# Patient Record
Sex: Male | Born: 1950 | Race: White | Hispanic: No | Marital: Married | Smoking: Never smoker
Health system: Southern US, Community
[De-identification: ages and names within clinical notes are randomized; demographics above are authoritative.]

## PROBLEM LIST (undated history)

## (undated) DIAGNOSIS — D689 Coagulation defect, unspecified: Secondary | ICD-10-CM

## (undated) DIAGNOSIS — E785 Hyperlipidemia, unspecified: Secondary | ICD-10-CM

## (undated) DIAGNOSIS — I4892 Unspecified atrial flutter: Secondary | ICD-10-CM

## (undated) DIAGNOSIS — Z952 Presence of prosthetic heart valve: Secondary | ICD-10-CM

## (undated) DIAGNOSIS — IMO0001 Reserved for inherently not codable concepts without codable children: Secondary | ICD-10-CM

## (undated) DIAGNOSIS — I509 Heart failure, unspecified: Secondary | ICD-10-CM

## (undated) DIAGNOSIS — I447 Left bundle-branch block, unspecified: Secondary | ICD-10-CM

## (undated) DIAGNOSIS — G4730 Sleep apnea, unspecified: Secondary | ICD-10-CM

## (undated) DIAGNOSIS — J90 Pleural effusion, not elsewhere classified: Secondary | ICD-10-CM

## (undated) DIAGNOSIS — I10 Essential (primary) hypertension: Secondary | ICD-10-CM

## (undated) DIAGNOSIS — I4891 Unspecified atrial fibrillation: Secondary | ICD-10-CM

## (undated) DIAGNOSIS — R55 Syncope and collapse: Secondary | ICD-10-CM

## (undated) DIAGNOSIS — D649 Anemia, unspecified: Secondary | ICD-10-CM

## (undated) DIAGNOSIS — I351 Nonrheumatic aortic (valve) insufficiency: Secondary | ICD-10-CM

## (undated) DIAGNOSIS — J189 Pneumonia, unspecified organism: Secondary | ICD-10-CM

## (undated) DIAGNOSIS — Z5189 Encounter for other specified aftercare: Secondary | ICD-10-CM

## (undated) DIAGNOSIS — I313 Pericardial effusion (noninflammatory): Secondary | ICD-10-CM

## (undated) DIAGNOSIS — I951 Orthostatic hypotension: Secondary | ICD-10-CM

## (undated) DIAGNOSIS — K219 Gastro-esophageal reflux disease without esophagitis: Secondary | ICD-10-CM

## (undated) DIAGNOSIS — Z7901 Long term (current) use of anticoagulants: Secondary | ICD-10-CM

## (undated) DIAGNOSIS — K8591 Acute pancreatitis with uninfected necrosis, unspecified: Secondary | ICD-10-CM

## (undated) DIAGNOSIS — R05 Cough: Secondary | ICD-10-CM

## (undated) DIAGNOSIS — Z95810 Presence of automatic (implantable) cardiac defibrillator: Secondary | ICD-10-CM

## (undated) DIAGNOSIS — E119 Type 2 diabetes mellitus without complications: Secondary | ICD-10-CM

## (undated) DIAGNOSIS — I428 Other cardiomyopathies: Secondary | ICD-10-CM

## (undated) HISTORY — DX: Acute pancreatitis with uninfected necrosis, unspecified: K85.91

## (undated) HISTORY — DX: Pericardial effusion (noninflammatory): I31.3

## (undated) HISTORY — DX: Syncope and collapse: R55

## (undated) HISTORY — DX: Heart failure, unspecified: I50.9

## (undated) HISTORY — DX: Left bundle-branch block, unspecified: I44.7

## (undated) HISTORY — DX: Long term (current) use of anticoagulants: Z79.01

## (undated) HISTORY — DX: Coagulation defect, unspecified: D68.9

## (undated) HISTORY — DX: Hyperlipidemia, unspecified: E78.5

## (undated) HISTORY — DX: Presence of prosthetic heart valve: Z95.2

## (undated) HISTORY — DX: Essential (primary) hypertension: I10

## (undated) HISTORY — DX: Pleural effusion, not elsewhere classified: J90

## (undated) HISTORY — DX: Type 2 diabetes mellitus without complications: E11.9

## (undated) HISTORY — DX: Pneumonia, unspecified organism: J18.9

## (undated) HISTORY — DX: Unspecified atrial fibrillation: I48.91

## (undated) HISTORY — DX: Other cardiomyopathies: I42.8

## (undated) HISTORY — DX: Gastro-esophageal reflux disease without esophagitis: K21.9

## (undated) HISTORY — DX: Presence of automatic (implantable) cardiac defibrillator: Z95.810

## (undated) HISTORY — DX: Nonrheumatic aortic (valve) insufficiency: I35.1

## (undated) HISTORY — DX: Cough: R05

## (undated) HISTORY — PX: ESOPHAGOGASTRODUODENOSCOPY: SHX1529

## (undated) HISTORY — DX: Orthostatic hypotension: I95.1

## (undated) HISTORY — DX: Unspecified atrial flutter: I48.92

## (undated) SURGERY — RIGHT/LEFT HEART CATH AND CORONARY ANGIOGRAPHY
Anesthesia: Moderate Sedation

---

## 1997-01-06 HISTORY — PX: ESOPHAGOGASTRODUODENOSCOPY: SHX1529

## 1997-01-18 HISTORY — PX: DOPPLER ECHOCARDIOGRAPHY: SHX263

## 1997-10-30 ENCOUNTER — Ambulatory Visit (HOSPITAL_COMMUNITY): Admission: EM | Admit: 1997-10-30 | Discharge: 1997-10-30 | Payer: Self-pay | Admitting: Emergency Medicine

## 1997-11-03 ENCOUNTER — Ambulatory Visit (HOSPITAL_COMMUNITY): Admission: RE | Admit: 1997-11-03 | Discharge: 1997-11-03 | Payer: Self-pay | Admitting: Gastroenterology

## 1998-08-09 HISTORY — PX: ESOPHAGOGASTRODUODENOSCOPY: SHX1529

## 2001-01-09 ENCOUNTER — Inpatient Hospital Stay (HOSPITAL_COMMUNITY): Admission: EM | Admit: 2001-01-09 | Discharge: 2001-01-12 | Payer: Self-pay | Admitting: Emergency Medicine

## 2001-01-13 HISTORY — PX: ESOPHAGOGASTRODUODENOSCOPY: SHX1529

## 2001-02-24 ENCOUNTER — Encounter: Payer: Self-pay | Admitting: Gastroenterology

## 2001-02-24 ENCOUNTER — Ambulatory Visit (HOSPITAL_COMMUNITY): Admission: RE | Admit: 2001-02-24 | Discharge: 2001-02-24 | Payer: Self-pay | Admitting: Gastroenterology

## 2001-03-23 ENCOUNTER — Encounter: Payer: Self-pay | Admitting: Gastroenterology

## 2001-03-23 ENCOUNTER — Ambulatory Visit (HOSPITAL_COMMUNITY): Admission: RE | Admit: 2001-03-23 | Discharge: 2001-03-23 | Payer: Self-pay | Admitting: Gastroenterology

## 2001-03-23 DIAGNOSIS — K219 Gastro-esophageal reflux disease without esophagitis: Secondary | ICD-10-CM

## 2001-03-23 HISTORY — DX: Gastro-esophageal reflux disease without esophagitis: K21.9

## 2001-03-23 HISTORY — PX: ESOPHAGOGASTRODUODENOSCOPY: SHX1529

## 2001-12-25 HISTORY — PX: ESOPHAGOGASTRODUODENOSCOPY: SHX1529

## 2003-03-01 HISTORY — PX: MRI: SHX5353

## 2003-03-10 HISTORY — PX: OTHER SURGICAL HISTORY: SHX169

## 2003-04-07 ENCOUNTER — Ambulatory Visit (HOSPITAL_COMMUNITY): Admission: RE | Admit: 2003-04-07 | Discharge: 2003-04-07 | Payer: Self-pay | Admitting: Cardiovascular Disease

## 2003-09-27 HISTORY — PX: DOPPLER ECHOCARDIOGRAPHY: SHX263

## 2004-05-29 ENCOUNTER — Ambulatory Visit: Payer: Self-pay | Admitting: Cardiology

## 2004-06-12 ENCOUNTER — Ambulatory Visit: Payer: Self-pay | Admitting: Cardiology

## 2004-06-19 ENCOUNTER — Ambulatory Visit: Payer: Self-pay

## 2004-07-09 ENCOUNTER — Ambulatory Visit: Payer: Self-pay

## 2004-07-09 HISTORY — PX: DOPPLER ECHOCARDIOGRAPHY: SHX263

## 2005-01-22 ENCOUNTER — Ambulatory Visit: Payer: Self-pay | Admitting: Family Medicine

## 2005-02-14 ENCOUNTER — Ambulatory Visit: Payer: Self-pay | Admitting: Cardiology

## 2005-02-26 ENCOUNTER — Encounter: Payer: Self-pay | Admitting: Cardiology

## 2005-02-26 ENCOUNTER — Ambulatory Visit: Payer: Self-pay

## 2005-03-08 ENCOUNTER — Emergency Department: Payer: Self-pay | Admitting: Internal Medicine

## 2005-03-08 HISTORY — PX: ESOPHAGOGASTRODUODENOSCOPY: SHX1529

## 2005-03-13 ENCOUNTER — Ambulatory Visit: Payer: Self-pay | Admitting: Unknown Physician Specialty

## 2005-06-28 ENCOUNTER — Encounter: Payer: Self-pay | Admitting: Family Medicine

## 2005-06-28 LAB — CONVERTED CEMR LAB: PSA: 0.46 ng/mL

## 2005-07-19 ENCOUNTER — Ambulatory Visit: Payer: Self-pay | Admitting: Family Medicine

## 2005-07-25 ENCOUNTER — Ambulatory Visit: Payer: Self-pay | Admitting: Family Medicine

## 2005-08-06 ENCOUNTER — Ambulatory Visit: Payer: Self-pay | Admitting: Family Medicine

## 2005-10-28 ENCOUNTER — Ambulatory Visit: Payer: Self-pay | Admitting: Cardiology

## 2006-04-01 HISTORY — PX: DOPPLER ECHOCARDIOGRAPHY: SHX263

## 2006-04-03 ENCOUNTER — Ambulatory Visit: Payer: Self-pay

## 2006-04-03 ENCOUNTER — Encounter: Payer: Self-pay | Admitting: Cardiology

## 2006-04-08 ENCOUNTER — Ambulatory Visit: Payer: Self-pay | Admitting: Cardiology

## 2006-04-22 ENCOUNTER — Ambulatory Visit: Payer: Self-pay | Admitting: Cardiology

## 2006-04-22 LAB — CONVERTED CEMR LAB
BUN: 8 mg/dL (ref 6–23)
CO2: 28 meq/L (ref 19–32)
Calcium: 9 mg/dL (ref 8.4–10.5)
Chloride: 103 meq/L (ref 96–112)
GFR calc Af Amer: 100 mL/min
Glucose, Bld: 100 mg/dL — ABNORMAL HIGH (ref 70–99)
Potassium: 3.9 meq/L (ref 3.5–5.1)

## 2006-07-17 ENCOUNTER — Encounter: Payer: Self-pay | Admitting: Family Medicine

## 2006-07-17 DIAGNOSIS — R945 Abnormal results of liver function studies: Secondary | ICD-10-CM | POA: Insufficient documentation

## 2006-07-17 DIAGNOSIS — E669 Obesity, unspecified: Secondary | ICD-10-CM | POA: Insufficient documentation

## 2006-07-17 DIAGNOSIS — I359 Nonrheumatic aortic valve disorder, unspecified: Secondary | ICD-10-CM | POA: Insufficient documentation

## 2006-07-17 DIAGNOSIS — I10 Essential (primary) hypertension: Secondary | ICD-10-CM

## 2006-07-17 DIAGNOSIS — R7989 Other specified abnormal findings of blood chemistry: Secondary | ICD-10-CM | POA: Insufficient documentation

## 2006-07-17 DIAGNOSIS — K219 Gastro-esophageal reflux disease without esophagitis: Secondary | ICD-10-CM

## 2006-07-22 ENCOUNTER — Encounter (INDEPENDENT_AMBULATORY_CARE_PROVIDER_SITE_OTHER): Payer: Self-pay | Admitting: *Deleted

## 2006-07-28 ENCOUNTER — Ambulatory Visit: Payer: Self-pay | Admitting: Family Medicine

## 2006-07-31 ENCOUNTER — Ambulatory Visit: Payer: Self-pay | Admitting: Family Medicine

## 2006-07-31 DIAGNOSIS — E749 Disorder of carbohydrate metabolism, unspecified: Secondary | ICD-10-CM | POA: Insufficient documentation

## 2006-07-31 DIAGNOSIS — E7800 Pure hypercholesterolemia, unspecified: Secondary | ICD-10-CM | POA: Insufficient documentation

## 2006-07-31 DIAGNOSIS — R740 Nonspecific elevation of levels of transaminase and lactic acid dehydrogenase [LDH]: Secondary | ICD-10-CM

## 2006-07-31 LAB — CONVERTED CEMR LAB
ALT: 52 units/L (ref 0–53)
AST: 46 units/L — ABNORMAL HIGH (ref 0–37)
Albumin: 4 g/dL (ref 3.5–5.2)
Alkaline Phosphatase: 80 units/L (ref 39–117)
BUN: 7 mg/dL (ref 6–23)
CO2: 29 meq/L (ref 19–32)
Cholesterol: 178 mg/dL (ref 0–200)
Creatinine,U: 71.2 mg/dL
GFR calc Af Amer: 90 mL/min
GFR calc non Af Amer: 74 mL/min
Glucose, Bld: 117 mg/dL — ABNORMAL HIGH (ref 70–99)
LDL Cholesterol: 123 mg/dL — ABNORMAL HIGH (ref 0–99)
Microalb Creat Ratio: 2.8 mg/g (ref 0.0–30.0)
Microalb, Ur: 0.2 mg/dL (ref 0.0–1.9)
PSA: 0.71 ng/mL (ref 0.10–4.00)
Total Protein: 7.1 g/dL (ref 6.0–8.3)

## 2006-08-08 ENCOUNTER — Ambulatory Visit: Payer: Self-pay | Admitting: Family Medicine

## 2006-08-08 DIAGNOSIS — D485 Neoplasm of uncertain behavior of skin: Secondary | ICD-10-CM

## 2006-08-13 ENCOUNTER — Encounter (INDEPENDENT_AMBULATORY_CARE_PROVIDER_SITE_OTHER): Payer: Self-pay | Admitting: *Deleted

## 2006-08-13 ENCOUNTER — Ambulatory Visit: Payer: Self-pay | Admitting: Family Medicine

## 2006-10-07 ENCOUNTER — Ambulatory Visit: Payer: Self-pay | Admitting: Cardiology

## 2006-10-07 LAB — CONVERTED CEMR LAB
CO2: 29 meq/L (ref 19–32)
Calcium: 9.9 mg/dL (ref 8.4–10.5)
Creatinine, Ser: 1.2 mg/dL (ref 0.4–1.5)
GFR calc Af Amer: 81 mL/min
Glucose, Bld: 89 mg/dL (ref 70–99)

## 2006-10-17 ENCOUNTER — Ambulatory Visit: Payer: Self-pay | Admitting: Cardiovascular Disease

## 2006-10-17 ENCOUNTER — Ambulatory Visit (HOSPITAL_COMMUNITY): Admission: RE | Admit: 2006-10-17 | Discharge: 2006-10-17 | Payer: Self-pay | Admitting: Cardiology

## 2006-11-07 ENCOUNTER — Ambulatory Visit: Payer: Self-pay | Admitting: Family Medicine

## 2007-04-08 ENCOUNTER — Ambulatory Visit: Payer: Self-pay | Admitting: Cardiology

## 2007-04-08 LAB — CONVERTED CEMR LAB
BUN: 10 mg/dL (ref 6–23)
GFR calc Af Amer: 100 mL/min
Potassium: 3.8 meq/L (ref 3.5–5.1)

## 2007-04-22 ENCOUNTER — Ambulatory Visit: Payer: Self-pay | Admitting: Cardiovascular Disease

## 2007-04-22 ENCOUNTER — Ambulatory Visit (HOSPITAL_COMMUNITY): Admission: RE | Admit: 2007-04-22 | Discharge: 2007-04-22 | Payer: Self-pay | Admitting: Cardiology

## 2007-11-06 ENCOUNTER — Ambulatory Visit: Payer: Self-pay | Admitting: Cardiology

## 2008-05-06 ENCOUNTER — Ambulatory Visit: Payer: Self-pay | Admitting: Cardiology

## 2008-05-06 ENCOUNTER — Encounter: Payer: Self-pay | Admitting: Cardiology

## 2008-05-16 ENCOUNTER — Ambulatory Visit (HOSPITAL_COMMUNITY): Admission: RE | Admit: 2008-05-16 | Discharge: 2008-05-16 | Payer: Self-pay | Admitting: Cardiology

## 2008-05-16 ENCOUNTER — Ambulatory Visit: Payer: Self-pay | Admitting: Cardiovascular Disease

## 2008-05-16 ENCOUNTER — Telehealth: Payer: Self-pay | Admitting: Cardiology

## 2008-05-24 ENCOUNTER — Telehealth (INDEPENDENT_AMBULATORY_CARE_PROVIDER_SITE_OTHER): Payer: Self-pay | Admitting: *Deleted

## 2008-06-11 ENCOUNTER — Observation Stay (HOSPITAL_COMMUNITY): Admission: EM | Admit: 2008-06-11 | Discharge: 2008-06-11 | Payer: Self-pay | Admitting: Emergency Medicine

## 2008-06-11 HISTORY — PX: ESOPHAGOGASTRODUODENOSCOPY: SHX1529

## 2008-09-14 ENCOUNTER — Encounter (INDEPENDENT_AMBULATORY_CARE_PROVIDER_SITE_OTHER): Payer: Self-pay | Admitting: *Deleted

## 2008-10-05 ENCOUNTER — Ambulatory Visit: Payer: Self-pay | Admitting: Family Medicine

## 2008-10-05 DIAGNOSIS — L259 Unspecified contact dermatitis, unspecified cause: Secondary | ICD-10-CM

## 2008-10-11 ENCOUNTER — Telehealth: Payer: Self-pay | Admitting: Family Medicine

## 2008-11-21 ENCOUNTER — Encounter: Payer: Self-pay | Admitting: Cardiology

## 2008-12-20 ENCOUNTER — Ambulatory Visit: Payer: Self-pay | Admitting: Cardiology

## 2008-12-23 ENCOUNTER — Encounter: Payer: Self-pay | Admitting: Cardiology

## 2009-01-05 ENCOUNTER — Encounter: Payer: Self-pay | Admitting: Cardiology

## 2009-01-05 ENCOUNTER — Ambulatory Visit (HOSPITAL_COMMUNITY): Admission: RE | Admit: 2009-01-05 | Discharge: 2009-01-05 | Payer: Self-pay | Admitting: Cardiology

## 2009-01-05 ENCOUNTER — Ambulatory Visit: Payer: Self-pay

## 2009-01-05 ENCOUNTER — Ambulatory Visit: Payer: Self-pay | Admitting: Cardiology

## 2009-01-28 HISTORY — PX: HERNIA REPAIR: SHX51

## 2009-06-01 ENCOUNTER — Encounter: Payer: Self-pay | Admitting: Cardiology

## 2009-06-05 ENCOUNTER — Ambulatory Visit (HOSPITAL_BASED_OUTPATIENT_CLINIC_OR_DEPARTMENT_OTHER): Admission: RE | Admit: 2009-06-05 | Discharge: 2009-06-05 | Payer: Self-pay | Admitting: Cardiology

## 2009-06-05 ENCOUNTER — Ambulatory Visit: Payer: Self-pay | Admitting: Cardiology

## 2009-06-05 ENCOUNTER — Encounter (INDEPENDENT_AMBULATORY_CARE_PROVIDER_SITE_OTHER): Payer: Self-pay | Admitting: *Deleted

## 2009-06-05 DIAGNOSIS — R0989 Other specified symptoms and signs involving the circulatory and respiratory systems: Secondary | ICD-10-CM

## 2009-06-05 DIAGNOSIS — R0609 Other forms of dyspnea: Secondary | ICD-10-CM | POA: Insufficient documentation

## 2009-06-11 ENCOUNTER — Ambulatory Visit: Payer: Self-pay | Admitting: Pulmonary Disease

## 2009-06-20 ENCOUNTER — Telehealth: Payer: Self-pay | Admitting: Cardiology

## 2009-06-22 ENCOUNTER — Ambulatory Visit (HOSPITAL_COMMUNITY): Admission: RE | Admit: 2009-06-22 | Discharge: 2009-06-22 | Payer: Self-pay | Admitting: Cardiology

## 2009-06-22 ENCOUNTER — Ambulatory Visit: Payer: Self-pay | Admitting: Cardiovascular Disease

## 2009-06-27 ENCOUNTER — Encounter: Payer: Self-pay | Admitting: Cardiology

## 2009-06-28 DIAGNOSIS — R059 Cough, unspecified: Secondary | ICD-10-CM

## 2009-06-28 HISTORY — PX: VALVE REPLACEMENT: SUR13

## 2009-06-28 HISTORY — DX: Cough, unspecified: R05.9

## 2009-06-29 ENCOUNTER — Inpatient Hospital Stay (HOSPITAL_BASED_OUTPATIENT_CLINIC_OR_DEPARTMENT_OTHER): Admission: RE | Admit: 2009-06-29 | Discharge: 2009-06-29 | Payer: Self-pay | Admitting: Cardiology

## 2009-06-29 ENCOUNTER — Ambulatory Visit: Payer: Self-pay | Admitting: Cardiology

## 2009-07-04 ENCOUNTER — Ambulatory Visit: Payer: Self-pay | Admitting: Surgery

## 2009-07-04 ENCOUNTER — Encounter: Payer: Self-pay | Admitting: Cardiology

## 2009-07-06 ENCOUNTER — Encounter: Payer: Self-pay | Admitting: Surgery

## 2009-07-10 ENCOUNTER — Encounter: Payer: Self-pay | Admitting: Surgery

## 2009-07-10 ENCOUNTER — Ambulatory Visit: Payer: Self-pay | Admitting: Surgery

## 2009-07-10 ENCOUNTER — Inpatient Hospital Stay (HOSPITAL_COMMUNITY): Admission: RE | Admit: 2009-07-10 | Discharge: 2009-07-18 | Payer: Self-pay | Admitting: Surgery

## 2009-07-10 HISTORY — PX: CARDIAC VALVE REPLACEMENT: SHX585

## 2009-07-20 ENCOUNTER — Ambulatory Visit: Payer: Self-pay | Admitting: Internal Medicine

## 2009-07-25 ENCOUNTER — Ambulatory Visit: Payer: Self-pay | Admitting: Internal Medicine

## 2009-07-28 DIAGNOSIS — I3139 Other pericardial effusion (noninflammatory): Secondary | ICD-10-CM

## 2009-07-28 DIAGNOSIS — I313 Pericardial effusion (noninflammatory): Secondary | ICD-10-CM

## 2009-07-28 HISTORY — DX: Pericardial effusion (noninflammatory): I31.3

## 2009-07-28 HISTORY — DX: Other pericardial effusion (noninflammatory): I31.39

## 2009-08-01 ENCOUNTER — Ambulatory Visit: Payer: Self-pay | Admitting: Cardiology

## 2009-08-01 ENCOUNTER — Inpatient Hospital Stay (HOSPITAL_COMMUNITY): Admission: EM | Admit: 2009-08-01 | Discharge: 2009-08-12 | Payer: Self-pay

## 2009-08-03 ENCOUNTER — Encounter (INDEPENDENT_AMBULATORY_CARE_PROVIDER_SITE_OTHER): Payer: Self-pay | Admitting: Internal Medicine

## 2009-08-07 ENCOUNTER — Encounter: Payer: Self-pay | Admitting: Cardiothoracic Surgery

## 2009-08-11 ENCOUNTER — Encounter: Payer: Self-pay | Admitting: Cardiology

## 2009-08-12 ENCOUNTER — Encounter: Payer: Self-pay | Admitting: Cardiology

## 2009-08-15 ENCOUNTER — Ambulatory Visit: Payer: Self-pay | Admitting: Internal Medicine

## 2009-08-15 DIAGNOSIS — I4819 Other persistent atrial fibrillation: Secondary | ICD-10-CM | POA: Insufficient documentation

## 2009-08-15 DIAGNOSIS — I4891 Unspecified atrial fibrillation: Secondary | ICD-10-CM | POA: Insufficient documentation

## 2009-08-15 LAB — CONVERTED CEMR LAB
INR: 7.1 (ref 0.8–1.0)
POC INR: 6.2
Prothrombin Time: 76.3 s
Prothrombin Time: 76.3 s (ref 9.7–11.8)

## 2009-08-17 ENCOUNTER — Encounter: Payer: Self-pay | Admitting: Cardiology

## 2009-08-18 ENCOUNTER — Encounter: Payer: Self-pay | Admitting: Family Medicine

## 2009-08-21 ENCOUNTER — Ambulatory Visit: Payer: Self-pay | Admitting: Cardiology

## 2009-08-28 DIAGNOSIS — R55 Syncope and collapse: Secondary | ICD-10-CM

## 2009-08-28 DIAGNOSIS — I951 Orthostatic hypotension: Secondary | ICD-10-CM

## 2009-08-28 HISTORY — DX: Orthostatic hypotension: I95.1

## 2009-08-28 HISTORY — DX: Syncope and collapse: R55

## 2009-08-29 ENCOUNTER — Encounter: Payer: Self-pay | Admitting: Cardiology

## 2009-08-29 ENCOUNTER — Ambulatory Visit: Payer: Self-pay | Admitting: Surgery

## 2009-08-29 ENCOUNTER — Encounter: Admission: RE | Admit: 2009-08-29 | Discharge: 2009-08-29 | Payer: Self-pay | Admitting: Surgery

## 2009-09-01 ENCOUNTER — Telehealth: Payer: Self-pay | Admitting: Cardiology

## 2009-09-04 ENCOUNTER — Encounter (INDEPENDENT_AMBULATORY_CARE_PROVIDER_SITE_OTHER): Payer: Self-pay | Admitting: *Deleted

## 2009-09-06 ENCOUNTER — Ambulatory Visit: Payer: Self-pay | Admitting: Cardiology

## 2009-09-07 ENCOUNTER — Telehealth: Payer: Self-pay | Admitting: Cardiology

## 2009-09-11 ENCOUNTER — Ambulatory Visit: Payer: Self-pay | Admitting: Pulmonary Disease

## 2009-09-11 ENCOUNTER — Inpatient Hospital Stay (HOSPITAL_COMMUNITY): Admission: EM | Admit: 2009-09-11 | Discharge: 2009-09-15 | Payer: Self-pay | Admitting: Emergency Medicine

## 2009-09-11 ENCOUNTER — Ambulatory Visit: Payer: Self-pay | Admitting: Internal Medicine

## 2009-09-11 ENCOUNTER — Encounter: Payer: Self-pay | Admitting: Cardiology

## 2009-09-12 ENCOUNTER — Encounter (INDEPENDENT_AMBULATORY_CARE_PROVIDER_SITE_OTHER): Payer: Self-pay | Admitting: Internal Medicine

## 2009-09-12 ENCOUNTER — Ambulatory Visit: Payer: Self-pay | Admitting: Cardiothoracic Surgery

## 2009-09-18 ENCOUNTER — Ambulatory Visit: Payer: Self-pay | Admitting: Cardiology

## 2009-09-18 LAB — CONVERTED CEMR LAB: POC INR: 3.3

## 2009-09-19 ENCOUNTER — Ambulatory Visit: Payer: Self-pay | Admitting: Cardiology

## 2009-09-19 DIAGNOSIS — R002 Palpitations: Secondary | ICD-10-CM

## 2009-09-21 LAB — CONVERTED CEMR LAB
BUN: 7 mg/dL (ref 6–23)
Basophils Absolute: 0 10*3/uL (ref 0.0–0.1)
CO2: 27 meq/L (ref 19–32)
Calcium: 9 mg/dL (ref 8.4–10.5)
Creatinine, Ser: 0.7 mg/dL (ref 0.4–1.5)
Eosinophils Absolute: 0.2 10*3/uL (ref 0.0–0.7)
Glucose, Bld: 138 mg/dL — ABNORMAL HIGH (ref 70–99)
Lymphocytes Relative: 21.9 % (ref 12.0–46.0)
MCHC: 33.5 g/dL (ref 30.0–36.0)
MCV: 87.9 fL (ref 78.0–100.0)
Monocytes Absolute: 0.3 10*3/uL (ref 0.1–1.0)
Neutrophils Relative %: 70.2 % (ref 43.0–77.0)
Platelets: 357 10*3/uL (ref 150.0–400.0)
RBC: 4.08 M/uL — ABNORMAL LOW (ref 4.22–5.81)
RDW: 17.2 % — ABNORMAL HIGH (ref 11.5–14.6)

## 2009-09-28 ENCOUNTER — Encounter: Admission: RE | Admit: 2009-09-28 | Discharge: 2009-09-28 | Payer: Self-pay | Admitting: Surgery

## 2009-09-28 DIAGNOSIS — K8591 Acute pancreatitis with uninfected necrosis, unspecified: Secondary | ICD-10-CM

## 2009-09-28 HISTORY — DX: Acute pancreatitis with uninfected necrosis, unspecified: K85.91

## 2009-10-03 ENCOUNTER — Ambulatory Visit: Payer: Self-pay | Admitting: Cardiology

## 2009-10-03 ENCOUNTER — Ambulatory Visit: Payer: Self-pay | Admitting: Cardiovascular Disease

## 2009-10-03 LAB — CONVERTED CEMR LAB: POC INR: 2

## 2009-10-05 ENCOUNTER — Ambulatory Visit: Payer: Self-pay | Admitting: Pulmonary Disease

## 2009-10-05 DIAGNOSIS — G4730 Sleep apnea, unspecified: Secondary | ICD-10-CM | POA: Insufficient documentation

## 2009-10-05 DIAGNOSIS — G4733 Obstructive sleep apnea (adult) (pediatric): Secondary | ICD-10-CM

## 2009-10-09 ENCOUNTER — Inpatient Hospital Stay (HOSPITAL_COMMUNITY): Admission: EM | Admit: 2009-10-09 | Discharge: 2009-10-15 | Payer: Self-pay | Admitting: Emergency Medicine

## 2009-10-09 ENCOUNTER — Ambulatory Visit: Payer: Self-pay | Admitting: Pulmonary Disease

## 2009-10-11 ENCOUNTER — Encounter: Payer: Self-pay | Admitting: Pulmonary Disease

## 2009-10-18 ENCOUNTER — Encounter: Payer: Self-pay | Admitting: Family Medicine

## 2009-10-19 ENCOUNTER — Ambulatory Visit: Payer: Self-pay | Admitting: Family Medicine

## 2009-10-19 DIAGNOSIS — K863 Pseudocyst of pancreas: Secondary | ICD-10-CM

## 2009-10-19 DIAGNOSIS — K862 Cyst of pancreas: Secondary | ICD-10-CM | POA: Insufficient documentation

## 2009-10-23 ENCOUNTER — Ambulatory Visit: Payer: Self-pay | Admitting: Family Medicine

## 2009-10-24 ENCOUNTER — Ambulatory Visit: Payer: Self-pay | Admitting: Pulmonary Disease

## 2009-10-24 ENCOUNTER — Ambulatory Visit: Payer: Self-pay | Admitting: Cardiology

## 2009-10-24 LAB — CONVERTED CEMR LAB
BUN: 13 mg/dL (ref 6–23)
Bilirubin, Direct: 0.2 mg/dL (ref 0.0–0.3)
Chloride: 95 meq/L — ABNORMAL LOW (ref 96–112)
Glucose, Bld: 93 mg/dL (ref 70–99)
Potassium: 5.3 meq/L — ABNORMAL HIGH (ref 3.5–5.1)
Sodium: 130 meq/L — ABNORMAL LOW (ref 135–145)
Total Bilirubin: 0.7 mg/dL (ref 0.3–1.2)

## 2009-10-25 ENCOUNTER — Ambulatory Visit: Payer: Self-pay | Admitting: Cardiology

## 2009-10-25 ENCOUNTER — Telehealth: Payer: Self-pay | Admitting: Family Medicine

## 2009-10-25 ENCOUNTER — Inpatient Hospital Stay (HOSPITAL_COMMUNITY): Admission: EM | Admit: 2009-10-25 | Discharge: 2009-10-28 | Payer: Self-pay | Admitting: Emergency Medicine

## 2009-10-26 ENCOUNTER — Ambulatory Visit: Payer: Self-pay | Admitting: Vascular Surgery

## 2009-10-26 ENCOUNTER — Encounter (INDEPENDENT_AMBULATORY_CARE_PROVIDER_SITE_OTHER): Payer: Self-pay | Admitting: Internal Medicine

## 2009-10-28 DIAGNOSIS — J90 Pleural effusion, not elsewhere classified: Secondary | ICD-10-CM

## 2009-10-28 DIAGNOSIS — J189 Pneumonia, unspecified organism: Secondary | ICD-10-CM

## 2009-10-28 HISTORY — DX: Pleural effusion, not elsewhere classified: J90

## 2009-10-28 HISTORY — DX: Pneumonia, unspecified organism: J18.9

## 2009-10-31 ENCOUNTER — Telehealth: Payer: Self-pay | Admitting: Family Medicine

## 2009-10-31 ENCOUNTER — Encounter (INDEPENDENT_AMBULATORY_CARE_PROVIDER_SITE_OTHER): Payer: Self-pay | Admitting: *Deleted

## 2009-11-02 ENCOUNTER — Ambulatory Visit: Payer: Self-pay

## 2009-11-02 ENCOUNTER — Ambulatory Visit: Payer: Self-pay | Admitting: Cardiology

## 2009-11-02 DIAGNOSIS — K859 Acute pancreatitis without necrosis or infection, unspecified: Secondary | ICD-10-CM | POA: Insufficient documentation

## 2009-11-02 DIAGNOSIS — I319 Disease of pericardium, unspecified: Secondary | ICD-10-CM | POA: Insufficient documentation

## 2009-11-02 LAB — CONVERTED CEMR LAB
INR: 6.61
INR: 6.6 (ref 0.8–1.0)
POC INR: 6
Prothrombin Time: 70.8 s

## 2009-11-03 ENCOUNTER — Ambulatory Visit: Payer: Self-pay | Admitting: Surgery

## 2009-11-03 ENCOUNTER — Encounter: Payer: Self-pay | Admitting: Cardiology

## 2009-11-03 LAB — CONVERTED CEMR LAB
Basophils Absolute: 0 10*3/uL (ref 0.0–0.1)
CO2: 24 meq/L (ref 19–32)
Calcium: 7.8 mg/dL — ABNORMAL LOW (ref 8.4–10.5)
Creatinine, Ser: 1 mg/dL (ref 0.4–1.5)
Eosinophils Absolute: 0.1 10*3/uL (ref 0.0–0.7)
GFR calc non Af Amer: 79.82 mL/min (ref >60)
Glucose, Bld: 145 mg/dL — ABNORMAL HIGH (ref 70–99)
Hemoglobin: 10.1 g/dL — ABNORMAL LOW (ref 13.0–17.0)
Lymphocytes Relative: 13.9 % (ref 12.0–46.0)
MCHC: 34.2 g/dL (ref 30.0–36.0)
Monocytes Relative: 12.7 % — ABNORMAL HIGH (ref 3.0–12.0)
Neutro Abs: 5.5 10*3/uL (ref 1.4–7.7)
Neutrophils Relative %: 72.1 % (ref 43.0–77.0)
RBC: 3.63 M/uL — ABNORMAL LOW (ref 4.22–5.81)
RDW: 17.9 % — ABNORMAL HIGH (ref 11.5–14.6)
Sodium: 131 meq/L — ABNORMAL LOW (ref 135–145)

## 2009-11-09 ENCOUNTER — Ambulatory Visit: Payer: Self-pay | Admitting: Internal Medicine

## 2009-11-09 ENCOUNTER — Inpatient Hospital Stay (HOSPITAL_COMMUNITY): Admission: AD | Admit: 2009-11-09 | Discharge: 2009-11-16 | Payer: Self-pay | Admitting: Surgery

## 2009-11-13 ENCOUNTER — Ambulatory Visit: Payer: Self-pay | Admitting: Surgery

## 2009-11-14 ENCOUNTER — Telehealth (INDEPENDENT_AMBULATORY_CARE_PROVIDER_SITE_OTHER): Payer: Self-pay | Admitting: *Deleted

## 2009-11-14 ENCOUNTER — Telehealth: Payer: Self-pay | Admitting: Pulmonary Disease

## 2009-11-20 ENCOUNTER — Encounter: Payer: Self-pay | Admitting: Internal Medicine

## 2009-11-20 LAB — CONVERTED CEMR LAB: POC INR: 4.2

## 2009-11-23 ENCOUNTER — Encounter: Payer: Self-pay | Admitting: Cardiology

## 2009-11-24 ENCOUNTER — Emergency Department (HOSPITAL_COMMUNITY): Admission: EM | Admit: 2009-11-24 | Discharge: 2009-11-24 | Payer: Self-pay | Admitting: Emergency Medicine

## 2009-11-24 ENCOUNTER — Telehealth: Payer: Self-pay | Admitting: Cardiology

## 2009-11-29 ENCOUNTER — Encounter: Payer: Self-pay | Admitting: Cardiology

## 2009-11-30 ENCOUNTER — Ambulatory Visit: Payer: Self-pay | Admitting: Cardiology

## 2009-12-01 ENCOUNTER — Ambulatory Visit: Payer: Self-pay | Admitting: Cardiology

## 2009-12-01 LAB — CONVERTED CEMR LAB: POC INR: 2.7

## 2009-12-05 ENCOUNTER — Ambulatory Visit: Payer: Self-pay | Admitting: Surgery

## 2009-12-05 ENCOUNTER — Encounter: Payer: Self-pay | Admitting: Cardiology

## 2009-12-05 ENCOUNTER — Encounter: Admission: RE | Admit: 2009-12-05 | Discharge: 2009-12-05 | Payer: Self-pay | Admitting: Surgery

## 2009-12-05 LAB — CONVERTED CEMR LAB
Chloride: 105 meq/L (ref 96–112)
GFR calc non Af Amer: 102.65 mL/min (ref >60)
Potassium: 4.2 meq/L (ref 3.5–5.1)
Sodium: 136 meq/L (ref 135–145)

## 2009-12-11 ENCOUNTER — Encounter: Payer: Self-pay | Admitting: Cardiology

## 2009-12-12 ENCOUNTER — Ambulatory Visit: Payer: Self-pay | Admitting: Cardiology

## 2009-12-12 ENCOUNTER — Ambulatory Visit: Payer: Self-pay | Admitting: Cardiovascular Disease

## 2009-12-12 DIAGNOSIS — I959 Hypotension, unspecified: Secondary | ICD-10-CM | POA: Insufficient documentation

## 2009-12-12 LAB — CONVERTED CEMR LAB: POC INR: 1.7

## 2009-12-27 ENCOUNTER — Ambulatory Visit: Payer: Self-pay | Admitting: Cardiovascular Disease

## 2009-12-27 LAB — CONVERTED CEMR LAB: POC INR: 2.2

## 2010-01-06 ENCOUNTER — Encounter: Payer: Self-pay | Admitting: Cardiology

## 2010-01-08 ENCOUNTER — Encounter: Payer: Self-pay | Admitting: Family Medicine

## 2010-01-17 ENCOUNTER — Ambulatory Visit: Payer: Self-pay | Admitting: Cardiovascular Disease

## 2010-01-17 LAB — CONVERTED CEMR LAB: POC INR: 2

## 2010-02-07 ENCOUNTER — Telehealth (INDEPENDENT_AMBULATORY_CARE_PROVIDER_SITE_OTHER): Payer: Self-pay | Admitting: *Deleted

## 2010-02-08 ENCOUNTER — Ambulatory Visit
Admission: RE | Admit: 2010-02-08 | Discharge: 2010-02-08 | Payer: Self-pay | Source: Home / Self Care | Attending: Family Medicine | Admitting: Family Medicine

## 2010-02-08 ENCOUNTER — Other Ambulatory Visit: Payer: Self-pay | Admitting: Family Medicine

## 2010-02-08 LAB — CBC WITH DIFFERENTIAL/PLATELET
Basophils Absolute: 0 10*3/uL (ref 0.0–0.1)
Basophils Relative: 0.2 % (ref 0.0–3.0)
Eosinophils Absolute: 0 10*3/uL (ref 0.0–0.7)
Eosinophils Relative: 0.3 % (ref 0.0–5.0)
HCT: 33.1 % — ABNORMAL LOW (ref 39.0–52.0)
Hemoglobin: 11 g/dL — ABNORMAL LOW (ref 13.0–17.0)
Lymphocytes Relative: 15.4 % (ref 12.0–46.0)
Lymphs Abs: 1.6 10*3/uL (ref 0.7–4.0)
MCHC: 33.4 g/dL (ref 30.0–36.0)
MCV: 81.9 fl (ref 78.0–100.0)
Monocytes Absolute: 1.5 10*3/uL — ABNORMAL HIGH (ref 0.1–1.0)
Monocytes Relative: 14.3 % — ABNORMAL HIGH (ref 3.0–12.0)
Neutro Abs: 7.4 10*3/uL (ref 1.4–7.7)
Neutrophils Relative %: 69.8 % (ref 43.0–77.0)
Platelets: 287 10*3/uL (ref 150.0–400.0)
RBC: 4.04 Mil/uL — ABNORMAL LOW (ref 4.22–5.81)
RDW: 17.6 % — ABNORMAL HIGH (ref 11.5–14.6)
WBC: 10.5 10*3/uL (ref 4.5–10.5)

## 2010-02-08 LAB — HEPATIC FUNCTION PANEL
ALT: 12 U/L (ref 0–53)
AST: 25 U/L (ref 0–37)
Albumin: 2.8 g/dL — ABNORMAL LOW (ref 3.5–5.2)
Alkaline Phosphatase: 101 U/L (ref 39–117)
Bilirubin, Direct: 0.3 mg/dL (ref 0.0–0.3)
Total Bilirubin: 1.2 mg/dL (ref 0.3–1.2)
Total Protein: 7 g/dL (ref 6.0–8.3)

## 2010-02-08 LAB — BASIC METABOLIC PANEL
BUN: 13 mg/dL (ref 6–23)
CO2: 22 mEq/L (ref 19–32)
Calcium: 7.9 mg/dL — ABNORMAL LOW (ref 8.4–10.5)
Chloride: 92 mEq/L — ABNORMAL LOW (ref 96–112)
Creatinine, Ser: 0.9 mg/dL (ref 0.4–1.5)
GFR: 92.13 mL/min (ref >60.00)
Glucose, Bld: 80 mg/dL (ref 70–99)
Potassium: 3.8 mEq/L (ref 3.5–5.1)
Sodium: 131 mEq/L — ABNORMAL LOW (ref 135–145)

## 2010-02-08 LAB — LIPID PANEL
Cholesterol: 113 mg/dL (ref 0–200)
HDL: 28.2 mg/dL — ABNORMAL LOW (ref >39.00)
LDL Cholesterol: 68 mg/dL (ref 0–99)
Total CHOL/HDL Ratio: 4
Triglycerides: 82 mg/dL (ref 0.0–149.0)
VLDL: 16.4 mg/dL (ref 0.0–40.0)

## 2010-02-08 LAB — TSH: TSH: 1.13 u[IU]/mL (ref 0.35–5.50)

## 2010-02-13 ENCOUNTER — Ambulatory Visit
Admission: RE | Admit: 2010-02-13 | Discharge: 2010-02-13 | Payer: Self-pay | Source: Home / Self Care | Attending: Family Medicine | Admitting: Family Medicine

## 2010-02-13 DIAGNOSIS — R5381 Other malaise: Secondary | ICD-10-CM | POA: Insufficient documentation

## 2010-02-13 DIAGNOSIS — R5383 Other fatigue: Secondary | ICD-10-CM

## 2010-02-14 ENCOUNTER — Ambulatory Visit: Admission: RE | Admit: 2010-02-14 | Discharge: 2010-02-14 | Payer: Self-pay | Source: Home / Self Care

## 2010-02-15 ENCOUNTER — Encounter: Payer: Self-pay | Admitting: Cardiology

## 2010-02-15 ENCOUNTER — Ambulatory Visit: Admission: RE | Admit: 2010-02-15 | Discharge: 2010-02-15 | Payer: Self-pay | Source: Home / Self Care

## 2010-02-15 ENCOUNTER — Ambulatory Visit
Admission: RE | Admit: 2010-02-15 | Discharge: 2010-02-15 | Payer: Self-pay | Source: Home / Self Care | Attending: Cardiology | Admitting: Cardiology

## 2010-02-15 ENCOUNTER — Ambulatory Visit (HOSPITAL_COMMUNITY)
Admission: RE | Admit: 2010-02-15 | Discharge: 2010-02-15 | Payer: Self-pay | Source: Home / Self Care | Attending: Cardiology | Admitting: Cardiology

## 2010-02-15 DIAGNOSIS — R05 Cough: Secondary | ICD-10-CM | POA: Insufficient documentation

## 2010-02-15 DIAGNOSIS — R059 Cough, unspecified: Secondary | ICD-10-CM | POA: Insufficient documentation

## 2010-02-15 DIAGNOSIS — Z952 Presence of prosthetic heart valve: Secondary | ICD-10-CM | POA: Insufficient documentation

## 2010-02-15 HISTORY — PX: DOPPLER ECHOCARDIOGRAPHY: SHX263

## 2010-02-18 ENCOUNTER — Encounter: Payer: Self-pay | Admitting: Surgery

## 2010-02-19 ENCOUNTER — Ambulatory Visit
Admission: RE | Admit: 2010-02-19 | Discharge: 2010-02-19 | Payer: Self-pay | Source: Home / Self Care | Attending: Surgery | Admitting: Surgery

## 2010-02-20 ENCOUNTER — Encounter: Payer: Self-pay | Admitting: Cardiology

## 2010-02-20 ENCOUNTER — Ambulatory Visit
Admission: RE | Admit: 2010-02-20 | Discharge: 2010-02-20 | Payer: Self-pay | Source: Home / Self Care | Attending: Surgery | Admitting: Surgery

## 2010-02-20 NOTE — Assessment & Plan Note (Signed)
OFFICE VISIT  [name] [name], [name] [name] DOB:  [birth year]                                        February 20, 2010 CHART #:  [id number]  The patient returned to my office today for examination of his epigastric incision from subxiphoid pericardial window and repair of a epigastric incisional hernia by Dr. Luisa Hart.  This was performed on November 13, 2009.  The patient has done well following that surgery with no complications.  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 Luisa Hart.  He said that he still can eat very well and fills up easily.  He does not have very much energy level, but denies any chest pain or shortness of breath.  He has noticed that some of the sutures that were used to sew in the hernia repair patch have been poking up into the skin and one or two of them have poked down through the skin.  PHYSICAL EXAMINATION:  GENERAL:  Today, he looks thin, but in no distress. VITAL SIGNS:  Blood pressure is 134/80, pulse 74 and regular, respiratory rate is 20 and unlabored, oxygen saturation on room air is 98%. CARDIAC:  Shows regular rate and rhythm with crisp mechanical valve click.  There is no murmur. LUNGS:  Clear.  The sternotomy incision is well healed.  The sternum is stable.  The epigastric incision is well healed.  There is no hernia present.  The Prolene sutures of the recent hernia repair are palpable beneath the skin.  There is one suture at the lower end of the xiphoid process where the tip of the Prolene is just starting to protrude through the skin. There is no erythema or drainage.  There is no tenderness.  IMPRESSION:  The patient is doing fairly well following subxiphoid pericardial window and repair of a ventral incisional hernia from his original sternotomy incision.  He has lost significant weight due to decreased appetite likely related to the large pseudocyst and I think that is contributed to him being  able to feel all of the Prolene sutures beneath the skin after hernia repair.  I would not be concerned about this except that one of them is starting to protrude through the skin. I asked him to make a followup appointment to see Dr. Luisa Hart so that he can examine this and decide about removing the suture.  He may continue to have other sutures protrude through the skin over time, but at the present time it appears to only be one suture at the tip of the xiphoid process.  Evelene Croon, M.D. Electronically Signed  BB/MEDQ  D:  02/20/2010  T:  02/20/2010  Job:  [id number]  cc:   Rollene Rotunda, MD, [facility] Maisie Fus A. Cornett, M.D.

## 2010-02-23 ENCOUNTER — Telehealth: Payer: Self-pay | Admitting: Family Medicine

## 2010-02-25 LAB — CONVERTED CEMR LAB
AST: 32 units/L (ref 0–37)
Albumin: 4.1 g/dL (ref 3.5–5.2)
Alkaline Phosphatase: 69 units/L (ref 39–117)
Bilirubin, Direct: 0.1 mg/dL (ref 0.0–0.3)
Cholesterol: 161 mg/dL (ref 0–200)
Total CHOL/HDL Ratio: 4
Triglycerides: 93 mg/dL (ref 0.0–149.0)

## 2010-02-27 NOTE — Letter (Signed)
Summary: [facility]  [facility]   Imported By: Marylou Mccoy 10/10/2009 09:45:41  _____________________________________________________________________  External Attachment:    Type:   Image     Comment:   External Document

## 2010-02-27 NOTE — Progress Notes (Signed)
Summary: nos appt  Phone Note Call from Patient   Caller: [email or web address]  Call For: clance Summary of Call: In ref to nos from 10/17, wife states pt is in hospital at [facility], she wanted you to be aware that he was in rm [id number]. Initial call taken by: Darletta Moll,  November 14, 2009 9:26 AM

## 2010-02-27 NOTE — Letter (Signed)
[facility]   Imported By: Roderic Ovens 09/06/2009 15:53:05  _____________________________________________________________________  External Attachment:    Type:   Image     Comment:   External Document

## 2010-02-27 NOTE — Medication Information (Signed)
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   Any changes in medication regimen? yes       Details: Added alprazolam, ventolin prn, Avelox x 7 days, Mucinex, and Tussionex.   Recent/future dental: no  Any missed doses?: no       Is patient compliant with meds? yes       Allergies: 1)  ! * Primaxin 2)  ! Ace Inhibitors  Anticoagulation Management History:      The patient is taking warfarin and comes in today for a routine follow up visit.  Negative risk factors for bleeding include an age less than 41 years old.  The bleeding index is 'low risk'.  Positive CHADS2 values include History of HTN.  Negative CHADS2 values include Age > 48 years old.  His last INR was 7.1 ratio and today's INR is 6.61.  Prothrombin time is 70.8.  Anticoagulation responsible provider: Timmothy Baranowski MD, Fayrene Fearing.  INR POC: 6.0.  Cuvette Lot#: [id number].  Exp: 11/2010.    Anticoagulation Management Assessment/Plan:      The patient's current anticoagulation dose is Coumadin 2 mg tabs: Take 1/2 tab on M, W, F & Su.  and 1 tab on all other days..  The target INR is 2.0-3.0.  The next INR is due 11/06/2009.  Anticoagulation instructions were given to patient.  Results were reviewed/authorized by Cloyde Reams, RN, BSN.  He was notified by Cloyde Reams RN.         Prior Anticoagulation Instructions: INR 2.9  Continue taking 1/2 tablet everyday except take 1 tablet on Tuesdays, Thursdays, and Saturdays. Re-check INR in 10 days.   Current Anticoagulation  Instructions: INR 6.0 sent to the lab INR 6.61  Pt has not taken today's dosage of Coumadin, instructed pt to hold today's dosage. Called spoke with pt's wife, advised to hold x 3 days then resume 1/2 tablet daily except 1 tablet on Tuesdays, Thursdays, and Saturdays.  Recheck on Monday.

## 2010-02-27 NOTE — Progress Notes (Signed)
Summary: pt has fever  Phone Note Call from Patient Call back at Home Phone [phone]   Caller: Spouse- [name] Summary of Call: Pt's wife is reporting pt's temp is 101.8 now, was 102 after lunch, 101 this morning.  She is asking how high it should go before she takes him to ER.  Pt is not feeling well today, no energy or appetite. Initial call taken by: Lowella Petties CMA,  October 25, 2009 3:23 PM  Follow-up for Phone Call        I wouldn't wait.  I would proceed to ER for eval (given his recent history). Follow-up by: Crawford Givens MD,  October 25, 2009 3:44 PM  Additional Follow-up for Phone Call Additional follow up Details #1::        Advised pt's wife, she will take pt to ER and will call in the morning with report. Additional Follow-up by: Lowella Petties CMA,  October 25, 2009 3:46 PM

## 2010-02-27 NOTE — Assessment & Plan Note (Signed)
Summary: F/U [facility]/CLE   Vital Signs:  Patient profile:   60 year old male Height:      71 inches Weight:      227.50 pounds BMI:     31.84 Temp:     99.1 degrees F oral Pulse rate:   80 / minute Pulse rhythm:   regular BP sitting:   114 / 70  (left arm) Cuff size:   large  Vitals Entered By: Delilah Shan CMA Duncan Dull) (October 19, 2009 11:47 AM) CC: Hospital Follow Up   History of Present Illness: Recurrent pancreatitis.  Recent hospital recs reviewed.   Dec in appetite.  No FCNAV.  Avoiding fatty meals, eating in small quantities.  Still weak but improved from hospitalization.  has follow up with surgery pending.   Allergies: 1)  ! * Primaxin 2)  ! Ace Inhibitors  Past History:  Past Medical History: Last updated: 10/18/2009 Aortic insufficiency with a bicuspid aortic valve (the last MRI demonstrated the aortic root to be 4.8 x 4.7 cm.  There was moderate regurgitation.  He has normal chamber size with very mild left ventricular dysfunction) gastroesophageal reflux disease dyslipidemia  duodenal ulcer with GI bleeding hypertension,  obesity.  07/2009-Admisson for Pericardial effusion. Status post pericardial window  Acute pancreatitis with pancreatic necrosis. 08/2009- Admission for fever and syncope.  No source for fever seen on cultures.  Syncope thought to be related to orthostasis 09/2009 admission to [facility] with necrotizing pancreatitis and pseudocyst formation, also with ATN  Past Surgical History: Last updated: 08/18/2009 1987                Ala Mem GI Bleed Gastritis ?Chem, Working for [employer] Dog in Throat  EGD to Remove 1999                Chicken in Throat  EGD to REmove 01/06/97          EGD (Dr. Kinnie Scales), impaction, dilatation, Schatzki's Ring 01/18/97          Echo LVH, mild dilated  aorta - root  mod to severe Aortic Regurge  EF 50-55% 08/09/98            EGD [name]) - repeat dilatation 01/10/01          EGD  stricture/ H.H. duodenal ulcer/bleeding Marina Goodell) 12/25/01          EGD esophagus, stricture, dilated 03/23/01            EGD stricture/dilated 03/10/03            Cardiolite EKG (-)  ?small/apical infarct  EF 50% 2/05                 MRI  LVH  global hypokin  EF 48%  Mod A.I. 09/27/03            Echo - no changes, mod. severe aortic regurg. 07/09/04            Echo, mod. severe aortic regurg. 2-3+  T.R. , T.I.R., mild P.R. 03/08/05              EGD Schatzki's Ring  03/13/05 Schatzki's Ring - dilated, duodenitis 04/01/06              Echo Hypokin Apex  EF 55% 06/11/08            EGD w/ Food Disimpaction  Food Impaction [facility]   Sm Bulb Ulcer (Dr Ewing Schlein)  06/2009             St Jude mechanical valve per Dr. Laneta Simmers  Review of Systems       See HPI.  Otherwise negative.    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  Contact with labs.  I would have patient follow up with surgery and advance diet as per instructions.  No change in meds in the meantime.  Has follow up with cards re:INR.  No bleeding or bruising.  He agrees.    Orders: TLB-Hepatic/Liver Function Pnl (80076-HEPATIC) TLB-BMP (Basic Metabolic Panel-BMET) (80048-METABOL)  Complete Medication List: 1)  Carvedilol 3.125 Mg Tabs (Carvedilol) .... Take one tablet by mouth twice a day 2)  Ferrous Gluconate 324 Mg Tabs (Ferrous gluconate) .... Take 1 tablet by mouth two times a day 3)  Flonase 50 Mcg/act Susp (Fluticasone propionate) .... 2 sprays each nostril daily 4)  Pantoprazole Sodium 40 Mg Tbec (Pantoprazole sodium) .... Take 1 tablet by mouth once a day 5)  Amylas/lip/portease  .... Three times a day  before meals 6)  Cozaar 50 Mg Tabs (Losartan potassium) .... 2  tablet  by mouth once daily 7)  Coumadin 2 Mg Tabs (Warfarin sodium) .... Take 1/2 tab on m, w, f & su.  and 1 tab on all other days.  Patient Instructions: 1)  Follow up with the coumadin clinic next week and with the surgery clinic as scheduled.  I'd like to see you back after you get through with those appointmets.  Avoid large meals and fatty foods in the meantime.  Keep drinking sips of water during the day.  Let me know if you have concerns in the meantime.  2)  After you have your appointment with Dr. Luisa Hart, call back here for a appointment.  3)  Take care.  Glad to see you today.  Prescriptions: CARVEDILOL 3.125 MG TABS (CARVEDILOL) Take one tablet by mouth twice a day  #60 x 6   Entered by:   Delilah Shan CMA (AAMA)   Authorized by:   Crawford Givens MD   Signed by:   Delilah Shan CMA (AAMA) on 10/19/2009   Method used:   Electronically to        [facility]* (retail)       [address]       [place], [place]  [address]       Ph: [phone]       Fax: [phone]   RxID:   [id number]   Current Allergies (reviewed today): ! * PRIMAXIN ! ACE INHIBITORS     Immunization History:  Pneumovax Immunization History:    Pneumovax:  pneumovax (01/28/2009)

## 2010-02-27 NOTE — Medication Information (Signed)
Summary: rov/ln   Anticoagulant Therapy  Managed by: Weston Brass, PharmD Referring MD: Antoine Poche PCP: Shaune Leeks MD Supervising MD: Jens Som MD, Arlys John Indication 1: Aortic Valve Replacement Indication 2: Atrial Fibrillation Valve Type: St Judes -- Mechanical INR POC 1.4 INR RANGE 2.0-3.0  Dietary changes: no    Health status changes: no    Bleeding/hemorrhagic complications: no    Recent/future hospitalizations: no    Any changes in medication regimen? no    Recent/future dental: no  Any missed doses?: no       Is patient compliant with meds? yes       Allergies: 1)  ! * Primaxin  Anticoagulation Management History:      The patient is taking warfarin and comes in today for a routine follow up visit.  Negative risk factors for bleeding include an age less than 67 years old.  The bleeding index is 'low risk'.  Positive CHADS2 values include History of HTN.  Negative CHADS2 values include Age > 66 years old.  His last INR was 7.1 ratio.  Anticoagulation responsible Chidera Dearcos: Jens Som MD, Arlys John.  INR POC: 1.4.  Cuvette Lot#: [id number].  Exp: 10/2010.    Anticoagulation Management Assessment/Plan:      The patient's current anticoagulation dose is Warfarin sodium 4 mg tabs: Take as directed by coumadin clinic..  The target INR is 2.0-3.0.  The next INR is due 09/19/2009.  Anticoagulation instructions were given to patient.  Results were reviewed/authorized by Weston Brass, PharmD.  He was notified by Liana Gerold, PharmD Candidate.         Prior Anticoagulation Instructions: INR 2.8  Continue same dose of 1/2 tab daily except for 1 tab on Tuesday.     Current Anticoagulation Instructions: INR 1.4  Take 1 tablet today, then change to 1/2 tablet daily except 1 tablet Tue and Sat.  Return to clinic in 10 days

## 2010-02-27 NOTE — Medication Information (Signed)
Summary: ccr/lg  Anticoagulant Therapy  Managed by: Cloyde Reams, RN, BSN Referring MD: Antoine Poche PCP: Shaune Leeks MD Supervising MD: Graciela Husbands MD, Viviann Spare Indication 1: Aortic Valve Replacement Indication 2: Atrial Fibrillation Valve Type: St Judes -- Mechanical PT 76.3 INR POC 6.2 INR RANGE 2.0-3.0  Dietary changes: no    Health status changes: no    Bleeding/hemorrhagic complications: no    Recent/future hospitalizations: yes       Details: Pt recently hospitalized for pancreatitis, d/c on 08/12/09.   Any changes in medication regimen? no    Recent/future dental: no  Any missed doses?: no       Is patient compliant with meds? yes      Comments: Discharged on 08/12/09 INR 2.5.  Given 2 doses of 5mg  and 2 doses of 2.5mg  in hospital, discharged on 4mg  daily.    Current Medications (verified): 1)  Hyzaar 100-12.5 Mg Tabs (Losartan Potassium-Hctz) .... Take One By Mouth Daily 2)  Warfarin Sodium 4 Mg Tabs (Warfarin Sodium) .... Take As Directed By Coumadin Clinic. 3)  Carvedilol 3.125 Mg Tabs (Carvedilol) .... Take One Tablet By Mouth Twice A Day 4)  Ferrous Gluconate 324 Mg Tabs (Ferrous Gluconate) .... Take 1 Tablet By Mouth Two Times A Day 5)  Flonase 50 Mcg/act Susp (Fluticasone Propionate) .... 2 Sprays Each Nostril Daily 6)  Furosemide 40 Mg Tabs (Furosemide) .... Take 1 Tablet By Mouth Once A Day 7)  Pantoprazole Sodium 40 Mg Tbec (Pantoprazole Sodium) .... Take 1 Tablet By Mouth Once A Day 8)  Klor-Con M20 20 Meq Cr-Tabs (Potassium Chloride Crys Cr) .... Take 1 Tablet By Mouth Once A Day  Allergies: No Known Drug Allergies  Anticoagulation Management History:      The patient is taking warfarin and comes in today for a routine follow up visit.  Negative risk factors for bleeding include an age less than 29 years old.  The bleeding index is 'low risk'.  Positive CHADS2 values include History of HTN.  Negative CHADS2 values include Age > 34 years old.  Today's INR  is 7.1.  Prothrombin time is 76.3.  Anticoagulation responsible provider: Graciela Husbands MD, Viviann Spare.  INR POC: 6.2.  Cuvette Lot#: [id number].  Exp: 09/2010.    Anticoagulation Management Assessment/Plan:      The patient's current anticoagulation dose is Warfarin sodium 4 mg tabs: Take as directed by coumadin clinic..  The target INR is 2.0-3.0.  The next INR is due 08/21/2009.  Anticoagulation instructions were given to patient.  Results were reviewed/authorized by Cloyde Reams, RN, BSN.  He was notified by Cloyde Reams RN.         Prior Anticoagulation Instructions: INR 2.1 Today take 1 pill then change dose to 1/2 pill everyday except 1 pill on Tuesdays. Recheck in one week.   Current Anticoagulation Instructions: INR  6.2 sent to lab 7.1  Pt has not taken coumadin today.  Pt aware to hold. Called spoke with pt's wife advised to hold x 4 doses, then resume previous dosage prior to hospitalization 1mg  daily except 2mg  on Tuesdays.  Recheck on Monday 08/21/09. Cloyde Reams RN  August 15, 2009 4:49 PM

## 2010-02-27 NOTE — Medication Information (Signed)
Summary: rov/sp  Anticoagulant Therapy  Managed by: Bethena Midget, RN, BSN Referring MD: Antoine Poche PCP: Shaune Leeks MD Supervising MD: Gala Romney MD, Reuel Boom Indication 1: Aortic Valve Replacement Indication 2: Atrial Fibrillation Valve Type: St Judes -- Mechanical INR POC 2.1 INR RANGE 2.0-3.0  Dietary changes: no    Health status changes: no    Bleeding/hemorrhagic complications: no    Recent/future hospitalizations: no    Any changes in medication regimen? no    Recent/future dental: no  Any missed doses?: no       Is patient compliant with meds? yes       Allergies: No Known Drug Allergies  Anticoagulation Management History:      The patient is taking warfarin and comes in today for a routine follow up visit.  Negative risk factors for bleeding include an age less than 65 years old.  The bleeding index is 'low risk'.  Positive CHADS2 values include History of HTN.  Negative CHADS2 values include Age > 20 years old.  Anticoagulation responsible provider: Bensimhon MD, Reuel Boom.  INR POC: 2.1.  Cuvette Lot#: [id number].  Exp: 09/2010.    Anticoagulation Management Assessment/Plan:      The target INR is 2.0-3.0.  The next INR is due 08/02/2009.  Anticoagulation instructions were given to patient.  Results were reviewed/authorized by Bethena Midget, RN, BSN.  He was notified by Bethena Midget, RN, BSN.         Prior Anticoagulation Instructions: INR 3.5  Skip today's dose of Coumadin then decrease dose to 1/2 tablet every day (1mg )  Current Anticoagulation Instructions: INR 2.1 Today take 1 pill then change dose to 1/2 pill everyday except 1 pill on Tuesdays. Recheck in one week.

## 2010-02-27 NOTE — Procedures (Signed)
Summary: Summary Report  Summary Report   Imported By: Erle Crocker 11/07/2009 13:24:24  _____________________________________________________________________  External Attachment:    Type:   Image     Comment:   External Document

## 2010-02-27 NOTE — Progress Notes (Signed)
Summary: refill request for creon  Phone Note Refill Request Message from:  Fax from Pharmacy  Refills Requested: Medication #1:  CREON 6000 UNIT CPEP take one by mouth three times a day Faxed request from [facility], he has previously been getting this from another doctor.  Pt told pharmacy that you are going to start prescribing this.  Initial call taken by: Lowella Petties CMA,  October 31, 2009 4:13 PM  Follow-up for Phone Call       Follow-up by: Crawford Givens MD,  October 31, 2009 11:01 PM    Prescriptions: CREON 6000 UNIT CPEP (PANCRELIPASE (LIP-PROT-AMYL)) take one by mouth three times a day  #90 x 12   Entered and Authorized by:   Crawford Givens MD   Signed by:   Crawford Givens MD on 10/31/2009   Method used:   Electronically to        [facility]* (retail)       [address]       [place], [place]  [address]       Ph: [phone]       Fax: [phone]   RxID:   [id number]

## 2010-02-27 NOTE — Progress Notes (Signed)
Summary: pt going to emergency room    Phone Note From Other Clinic   Caller: jennifer from dr. Lowella Fairy office [phone] Request: Talk with Nurse Details of Complaint: Pt is on his way to emergency room .home health talk with the on call md last night.  Initial call taken by: Lorne Skeens,  November 24, 2009 10:21 AM  Follow-up for Phone Call        Dr Antoine Poche aware Follow-up by: Charolotte Capuchin, RN,  November 24, 2009 10:36 AM

## 2010-02-27 NOTE — Assessment & Plan Note (Signed)
Summary: eph/jml  Medications Added COUMADIN 3 MG TABS (WARFARIN SODIUM) as directed LOSARTAN POTASSIUM 50 MG TABS (LOSARTAN POTASSIUM) 1 tab by mouth once daily * AMYLAS/LIP/PORTEASE three times a day before meals        Visit Type:  Follow-up Primary Provider:  Shaune Leeks MD  CC:  Aortic valve replacement.  History of Present Illness: The patient presents for followup after aortic valve replacement. He's had a bit of a rough time following the surgery. He had a pleural effusion which required surgical drainage. He came back for a third hospitalization with dehydration and was found to have pancreatitis. At that time an echo did demonstrate some very mild pericardial effusion but seem to be resolving. Since going home he has felt weak. He reports continued mild nausea. He hasn't been getting out and doing it because of tiredness. Mild nonproductive cough. He is not describing any PND or orthopnea. He's not having any chest pressure, neck or arm discomfort. He's not having any swelling or edema. He's had no fevers or chills. He's had no palpitations, presyncope or syncope.  Current Medications (verified): 1)  Coumadin 3 Mg Tabs (Warfarin Sodium) .... As Directed 2)  Carvedilol 3.125 Mg Tabs (Carvedilol) .... Take One Tablet By Mouth Twice A Day 3)  Ferrous Gluconate 324 Mg Tabs (Ferrous Gluconate) .... Take 1 Tablet By Mouth Two Times A Day 4)  Flonase 50 Mcg/act Susp (Fluticasone Propionate) .... 2 Sprays Each Nostril Daily 5)  Pantoprazole Sodium 40 Mg Tbec (Pantoprazole Sodium) .... Take 1 Tablet By Mouth Once A Day 6)  Losartan Potassium 50 Mg Tabs (Losartan Potassium) .[name].. 1 Tab By Mouth Once Daily 7)  Amylas/lip/portease .... Three Times A Day Before Meals  Allergies: 1)  ! * Primaxin  Past History:  Past Medical History: Aortic insufficiency with a bicuspid aortic valve (the last MRI demonstrated the aortic root to be 4.8 x 4.7 cm.  There was moderate regurgitation.   He has normal chamber size with very mild left ventricular dysfunction), gastroesophageal reflux disease, dyslipidemia duodenal ulcer with GI bleeding, hypertension,  obesity. 07/2009-Admisson for Pericardial effusion. Status post pericardial window Acute pancreatitis with pancreatic necrosis.08/2009- Admission for fever and syncope.  No source for fever seen on cultures.  Syncope thought to be related to orthostasis  Past Surgical History: Reviewed history from 08/18/2009 and no changes required. 1987                Ala Mem GI Bleed Gastritis ?Chem, Working for [employer] Dog in Throat  EGD to Remove 1999                Chicken in Throat  EGD to REmove 01/06/97          EGD (Dr. Kinnie Scales), impaction, dilatation, Schatzki's Ring 01/18/97          Echo LVH, mild dilated  aorta - root  mod to severe Aortic Regurge  EF 50-55% 08/09/98            EGD [name]) - repeat dilatation 01/10/01          EGD stricture/ H.H. duodenal ulcer/bleeding Marina Goodell) 12/25/01          EGD esophagus, stricture, dilated 03/23/01            EGD stricture/dilated 03/10/03            Cardiolite EKG (-)  ?small/apical  infarct  EF 50% 2/05                 MRI  LVH  global hypokin  EF 48%  Mod A.I. 09/27/03            Echo - no changes, mod. severe aortic regurg. 07/09/04            Echo, mod. severe aortic regurg. 2-3+  T.R. , T.I.R., mild P.R. 03/08/05              EGD Schatzki's Ring  03/13/05 Schatzki's Ring - dilated, duodenitis 04/01/06              Echo Hypokin Apex  EF 55% 06/11/08            EGD w/ Food Disimpaction  Food Impaction [facility]   Sm Bulb Ulcer (Dr Ewing Schlein)  06/2009             St Jude mechanical valve per Dr. Laneta Simmers  Review of Systems       As stated in the HPI and negative for all other systems.   Vital Signs:  Patient profile:   60 year old male Height:      71 inches Weight:      222 pounds BMI:     31.07 Pulse rate:   60 / minute Resp:     16 per minute BP sitting:   122 / 80  (left  arm)  Vitals Entered By: Kem Parkinson (September 19, 2009 2:44 PM)  Physical Exam  General:  Well developed, well nourished, in no acute distress. Head:  normocephalic and atraumatic Eyes:  PERRLA/EOM intact; conjunctiva and lids normal. Mouth:  Teeth, gums and palate normal. Oral mucosa normal. Neck:  Neck supple, no JVD. No masses, thyromegaly or abnormal cervical nodes. Chest Wall:  Well-healed sternotomy scar Lungs:  Clear bilaterally to auscultation and percussion. Abdomen:  Bowel sounds positive; abdomen soft and non-tender without masses, organomegaly, or hernias noted. No hepatosplenomegaly. Msk:  Back normal, normal gait. Muscle strength and tone normal. Extremities:  No clubbing or cyanosis. Neurologic:  Alert and oriented x 3. Skin:  Intact without lesions or rashes. Cervical Nodes:  no significant adenopathy Inguinal Nodes:  no significant adenopathy Psych:  Normal affect.   Detailed Cardiovascular Exam  Neck    Carotids: Carotids full and equal bilaterally without bruits.      Neck Veins: Normal, no JVD.    Heart    Inspection: no deformities or lifts noted.      Palpation: normal PMI with no thrills palpable.      Auscultation: S1 within normal limits, mechanical S2, no S3, no S4, no clicks, no rubs, no murmur  Vascular    Abdominal Aorta: no palpable masses, pulsations, or audible bruits.      Femoral Pulses: normal femoral pulses bilaterally.      Pedal Pulses: normal pedal pulses bilaterally.      Peripheral Circulation: no clubbing, cyanosis, or edema noted with normal capillary refill.     Impression & Recommendations:  Problem # 1:  AORTIC INSUFFICIENCY (ICD-424.1) He is making a somewhat slow recovery but I think he'll do well ultimately. I have made a referral to cardiac rehabilitation. I will check some blood work to include a CBC and be met since he was somewhat anemic at his last visit.  Problem # 2:  PALPITATIONS (ICD-785.1) He is describing  nighttime palpitations and I will place a 24-hour Holter monitor. Orders:  Holter Monitor (Holter Monitor)  Problem # 3:  SNORING (ICD-786.09) He needs a referral to a pulmonologist as he has CPAP but can't wear it.  Other Orders: TLB-BMP (Basic Metabolic Panel-BMET) (80048-METABOL) TLB-CBC Platelet - w/Differential (85025-CBCD)  Patient Instructions: 1)  Your physician recommends that you schedule a follow-up appointment in: 2 months with Dr Antoine Poche 2)  Your physician recommends that you have labs today  BMP  CBC 3)  Your physician recommends that you continue on your current medications as directed. Please refer to the Current Medication list given to you today. 4)  Your physician has recommended that you wear a holter monitor.  Holter monitors are medical devices that record the heart's electrical activity. Doctors most often use these monitors to diagnose arrhythmias. Arrhythmias are problems with the speed or rhythm of the heartbeat. The monitor is a small, portable device. You can wear one while you do your normal daily activities. This is usually used to diagnose what is causing palpitations/syncope (passing out).

## 2010-02-27 NOTE — Medication Information (Signed)
Summary: rov/sl  Anticoagulant Therapy  Managed by: Cloyde Reams, RN, BSN Referring MD: Antoine Poche PCP: Shaune Leeks MD Supervising MD: Eden Emms MD, Theron Arista Indication 1: Aortic Valve Replacement Indication 2: Atrial Fibrillation Valve Type: St Judes -- Mechanical INR POC 2.0 INR RANGE 2.0-3.0  Dietary changes: no    Health status changes: yes       Details: Fever 101 last night, lightheaded this am.    Bleeding/hemorrhagic complications: no    Recent/future hospitalizations: no    Any changes in medication regimen? no    Recent/future dental: no  Any missed doses?: no       Is patient compliant with meds? yes       Allergies: 1)  ! * Primaxin  Anticoagulation Management History:      The patient is taking warfarin and comes in today for a routine follow up visit.  Negative risk factors for bleeding include an age less than 21 years old.  The bleeding index is 'low risk'.  Positive CHADS2 values include History of HTN.  Negative CHADS2 values include Age > 81 years old.  His last INR was 7.1 ratio.  Anticoagulation responsible provider: Eden Emms MD, Theron Arista.  INR POC: 2.0.  Cuvette Lot#: [id number].  Exp: 10/2010.    Anticoagulation Management Assessment/Plan:      The patient's current anticoagulation dose is Coumadin 3 mg tabs: as directed.  The target INR is 2.0-3.0.  The next INR is due 10/24/2009.  Anticoagulation instructions were given to patient.  Results were reviewed/authorized by Cloyde Reams, RN, BSN.  He was notified by Cloyde Reams RN.         Prior Anticoagulation Instructions: INR 3.3  Hold Coumadin today, Monday, August 22nd. Then, take Coumadin 0.5 tab (1 mg) on Sun, Mon, Wed, Fri and Coumadin 1 tab (2 mg) on Tues, Thur Sat. Return to clinic in 2 weeks.   Current Anticoagulation Instructions: INR 2.0  Continue on same dosage 1/2 tablet daily except 1 tablet on Tuesdays, Thursdays, and Saturdays.  Recheck in 3 weeks.

## 2010-02-27 NOTE — Assessment & Plan Note (Signed)
Summary: home sleep testing with very mild osa, AHI 7/hr.   Copy to:  Dr. Durene Fruits Primary Provider/Referring Provider:  Shaune Leeks MD   History of Present Illness: the pt underwent unattended home sleep study on 10/23/2009 with a type 3 monitoring device.  He had a total flow evaluation and sat evaluation period of greater than 8 hours.  Data has been reviewed and appears to be valid.   Findings:  1) the pt had 2 obstructive apneas and 60 hypopneas, for an AHI of 7/hr.  This is c/w very mild osa 2) low sat was 82% transiently, with only less than 88% for the entire night.  Allergies: 1)  ! * Primaxin 2)  ! Ace Inhibitors   Impression & Recommendations:  Problem # 1:  OBSTRUCTIVE SLEEP APNEA (ICD-327.23)  very mild osa by his recent home study after a significant weight loss.  This represents very little CV risk for him, and treatment should depend upon impact to QOL.  I would expect this to resolve with continued weight loss. Will call pt and discuss study, along with treatment plans.  Other Orders: Sleep Std Airflow/Heartrate and O2 SAT unattended (32440)  Appended Document: home sleep testing with very mild osa, AHI 7/hr. I need to call pt about results.  His home number above is no longer in service.  We need to find his current number and make changes in computer.  Appended Document: home sleep testing with very mild osa, AHI 7/hr. went thru with area code attached.  Appended Document: home sleep testing with very mild osa, AHI 7/hr. Attempted to call home number again and it was his cousin's phone number. She stated mr. Randell Loop is in [place] for pna. He has been in their since yesterday. She gave me his wife's number her name is Lyndal Reggio and it is [phone].   Appended Document: home sleep testing with very mild osa, AHI 7/hr. let wife know that I need to see him to discuss results of sleep study once he gets home.    Appended Document: home  sleep testing with very mild osa, AHI 7/hr. called and spoke with pt's wife. She wanted to go ahead and set up his apt and it is on 11/06/09 at 9:45 a.m.

## 2010-02-27 NOTE — Medication Information (Signed)
Summary: rov/ewj  Anticoagulant Therapy  Managed by: Weston Brass, PharmD Referring MD: Antoine Poche PCP: Shaune Leeks MD Supervising MD: Riley Kill MD, Maisie Fus Indication 1: Aortic Valve Replacement Indication 2: Atrial Fibrillation Valve Type: St Judes -- Mechanical INR POC 2.8 INR RANGE 2.0-3.0  Dietary changes: no    Health status changes: yes       Details: had some dizziness yesterday after being outside, has SOB when walking, encouraged pt to talk with MD  Bleeding/hemorrhagic complications: no    Recent/future hospitalizations: no    Any changes in medication regimen? no    Recent/future dental: no  Any missed doses?: no       Is patient compliant with meds? yes       Allergies: No Known Drug Allergies  Anticoagulation Management History:      The patient is taking warfarin and comes in today for a routine follow up visit.  Negative risk factors for bleeding include an age less than 7 years old.  The bleeding index is 'low risk'.  Positive CHADS2 values include History of HTN.  Negative CHADS2 values include Age > 46 years old.  His last INR was 7.1 ratio.  Anticoagulation responsible provider: Riley Kill MD, Maisie Fus.  INR POC: 2.8.  Cuvette Lot#: [id number].  Exp: 10/2010.    Anticoagulation Management Assessment/Plan:      The patient's current anticoagulation dose is Warfarin sodium 4 mg tabs: Take as directed by coumadin clinic..  The target INR is 2.0-3.0.  The next INR is due 09/06/2009.  Anticoagulation instructions were given to patient.  Results were reviewed/authorized by Weston Brass, PharmD.  He was notified by Dillard Cannon.         Prior Anticoagulation Instructions: INR  6.2 sent to lab 7.1  Pt has not taken coumadin today.  Pt aware to hold. Called spoke with pt's wife advised to hold x 4 doses, then resume previous dosage prior to hospitalization 1mg  daily except 2mg  on Tuesdays.  Recheck on Monday 08/21/09. Cloyde Reams RN  August 15, 2009 4:49 PM   Current  Anticoagulation Instructions: INR 2.8  Continue same dose of 1/2 tab daily except for 1 tab on Tuesday.

## 2010-02-27 NOTE — Medication Information (Signed)
Summary: [name] [name]   Anticoagulant Therapy  Managed by: Weston Brass, PharmD Referring MD: Antoine Poche PCP: Shaune Leeks MD Supervising MD: Myrtis Ser MD, Tinnie Gens Indication 1: Aortic Valve Replacement Indication 2: Atrial Fibrillation Valve Type: St Judes -- Mechanical INR POC 2.9 INR RANGE 2.0-3.0  Dietary changes: yes       Details: Was on liquid diet while in the hospital up until 2 days ago and still doesn't have much of an appetite  Health status changes: no    Bleeding/hemorrhagic complications: no    Recent/future hospitalizations: yes       Details: Pt was in the hospital 9/13-9/18 for pancreatitis. Pt received a dose of vitamin K in the hospital but is currently taking the same dose as before.   Any changes in medication regimen? yes       Details: Was on 2 abx in the hospital and finished course 3 days after discharged from hospital. He d/c oxycodone and cannot take that anymore.   Recent/future dental: no  Any missed doses?: no       Is patient compliant with meds? yes       Allergies: 1)  ! * Primaxin 2)  ! Ace Inhibitors  Anticoagulation Management History:      The patient is taking warfarin and comes in today for a routine follow up visit.  Negative risk factors for bleeding include an age less than 77 years old.  The bleeding index is 'low risk'.  Positive CHADS2 values include History of HTN.  Negative CHADS2 values include Age > 56 years old.  His last INR was 7.1 ratio.  Anticoagulation responsible Delia Sitar: Myrtis Ser MD, Tinnie Gens.  INR POC: 2.9.  Cuvette Lot#: [id number].  Exp: 11/2010.    Anticoagulation Management Assessment/Plan:      The patient's current anticoagulation dose is Coumadin 2 mg tabs: Take 1/2 tab on M, W, F & Su.  and 1 tab on all other days..  The target INR is 2.0-3.0.  The next INR is due 11/02/2009.  Anticoagulation instructions were given to patient.  Results were reviewed/authorized by Weston Brass, PharmD.  He was notified by Harrel Carina,  PharmD candidate.         Prior Anticoagulation Instructions: INR 2.0  Continue on same dosage 1/2 tablet daily except 1 tablet on Tuesdays, Thursdays, and Saturdays.  Recheck in 3 weeks.    Current Anticoagulation Instructions: INR 2.9  Continue taking 1/2 tablet everyday except take 1 tablet on Tuesdays, Thursdays, and Saturdays. Re-check INR in 10 days.

## 2010-02-27 NOTE — Medication Information (Signed)
Summary: new/sp  Anticoagulant Therapy  Managed by: Weston Brass, PharmD Referring MD: Antoine Poche PCP: Shaune Leeks MD Supervising MD: Johney Frame MD, Fayrene Fearing Indication 1: Aortic Valve Replacement Indication 2: Atrial Fibrillation Valve Type: St Judes -- Mechanical INR POC 3.5 INR RANGE 2.0-3.0  Dietary changes: no    Health status changes: no    Bleeding/hemorrhagic complications: no    Recent/future hospitalizations: yes       Details: Discharged 6/21 after AVR and post-op atrial fibrillation INR on discharge 1.96.  Pt discharged on Coumadin 2mg  daily   Any changes in medication regimen? yes       Details: started amiodarone during hospitalization.  on 400mg  BID at the moment but will taper down   Recent/future dental: no  Any missed doses?: no       Is patient compliant with meds? yes      Comments: Pt educated on dietary concerns, bleeding risks, and medication interactions.   Allergies: No Known Drug Allergies  Anticoagulation Management History:      The patient comes in today for his initial visit for anticoagulation therapy.  Negative risk factors for bleeding include an age less than 74 years old.  The bleeding index is 'low risk'.  Positive CHADS2 values include History of HTN.  Negative CHADS2 values include Age > 51 years old.  Anticoagulation responsible provider: Jatin Naumann MD, Fayrene Fearing.  INR POC: 3.5.  Cuvette Lot#: [id number].  Exp: 09/2010.    Anticoagulation Management Assessment/Plan:      The target INR is 2.0-3.0.  The next INR is due 07/25/2009.  Anticoagulation instructions were given to patient.  Results were reviewed/authorized by Weston Brass, PharmD.  He was notified by Weston Brass PharmD.         Current Anticoagulation Instructions: INR 3.5  Skip today's dose of Coumadin then decrease dose to 1/2 tablet every day (1mg )

## 2010-02-27 NOTE — Medication Information (Signed)
Summary: rov/sp   Anticoagulant Therapy  Managed by: Weston Brass, PharmD Referring MD: Antoine Poche PCP: Shaune Leeks MD Supervising MD: Excell Seltzer MD, Casimiro Needle Indication 1: Aortic Valve Replacement Indication 2: Atrial Fibrillation Valve Type: St Judes -- Mechanical INR POC 1.7 INR RANGE 2.0-3.0  Dietary changes: no    Health status changes: no    Bleeding/hemorrhagic complications: no    Recent/future hospitalizations: no    Any changes in medication regimen? no    Recent/future dental: no  Any missed doses?: no       Is patient compliant with meds? yes       Allergies: 1)  ! * Primaxin 2)  ! Ace Inhibitors  Anticoagulation Management History:      The patient is taking warfarin and comes in today for a routine follow up visit.  Positive risk factors for bleeding include presence of serious comorbidities.  Negative risk factors for bleeding include an age less than 53 years old.  The bleeding index is 'intermediate risk'.  Positive CHADS2 values include History of HTN.  Negative CHADS2 values include Age > 6 years old.  His last INR was 6.6 ratio.  Anticoagulation responsible provider: Excell Seltzer MD, Casimiro Needle.  INR POC: 1.7.  Exp: 11/2010.    Anticoagulation Management Assessment/Plan:      The patient's current anticoagulation dose is Coumadin 2 mg tabs: as directed.  The target INR is 2.0-3.0.  The next INR is due 12/27/2009.  Anticoagulation instructions were given to patient.  Results were reviewed/authorized by Weston Brass, PharmD.  He was notified by Weston Brass PharmD.         Prior Anticoagulation Instructions: INR 2.7 Continue previous dose 0.5 tablets everyday except 1 tabet on Tuesday, Thursday and Saturday. Recheck INR in 2 weeks.  Current Anticoagulation Instructions: INR 1.7  Take 1 1/2 tablets today then resume same dose of 1/2 tablet every day except 1 tablet on Tuesday, Thursday and Saturday.  Recheck INR in 2 weeks.

## 2010-02-27 NOTE — Miscellaneous (Signed)
Clinical Lists Changes  Observations: Added new observation of PAST SURG HX: 1987                Ala Mem GI Bleed Gastritis ?Chem, Working for [employer] Dog in Throat  EGD to Remove 1999                Chicken in Throat  EGD to REmove 01/06/97          EGD (Dr. Kinnie Scales), impaction, dilatation, Schatzki's Ring 01/18/97          Echo LVH, mild dilated  aorta - root  mod to severe Aortic Regurge  EF 50-55% 08/09/98            EGD [name]) - repeat dilatation 01/10/01          EGD stricture/ H.H. duodenal ulcer/bleeding Marina Goodell) 12/25/01          EGD esophagus, stricture, dilated 03/23/01            EGD stricture/dilated 03/10/03            Cardiolite EKG (-)  ?small/apical infarct  EF 50% 2/05                 MRI  LVH  global hypokin  EF 48%  Mod A.I. 09/27/03            Echo - no changes, mod. severe aortic regurg. 07/09/04            Echo, mod. severe aortic regurg. 2-3+  T.R. , T.I.R., mild P.R. 03/08/05              EGD Schatzki's Ring  03/13/05 Schatzki's Ring - dilated, duodenitis 04/01/06              Echo Hypokin Apex  EF 55% 06/11/08            EGD w/ Food Disimpaction  Food Impaction [facility]   Sm Bulb Ulcer (Dr Ewing Schlein)  06/2009             St Jude mechanical valve per Dr. Laneta Simmers (08/18/2009 10:20)      Past History:  Past Surgical History: 1987                Ala Mem GI Bleed Gastritis ?Chem, Working for [employer] Dog in Throat  EGD to Remove 1999                Chicken in Throat  EGD to REmove 01/06/97          EGD (Dr. Kinnie Scales), impaction, dilatation, Schatzki's Ring 01/18/97          Echo LVH, mild dilated  aorta - root  mod to severe Aortic Regurge  EF 50-55% 08/09/98            EGD [name]) - repeat dilatation 01/10/01          EGD stricture/ H.H. duodenal ulcer/bleeding Marina Goodell) 12/25/01          EGD esophagus, stricture, dilated 03/23/01            EGD stricture/dilated 03/10/03            Cardiolite EKG (-)  ?small/apical infarct  EF 50% 2/05  MRI  LVH  global hypokin  EF 48%  Mod A.I. 09/27/03            Echo - no changes, mod. severe aortic regurg. 07/09/04            Echo, mod. severe aortic regurg. 2-3+  T.R. , T.I.R., mild P.R. 03/08/05              EGD Schatzki's Ring  03/13/05 Schatzki's Ring - dilated, duodenitis 04/01/06              Echo Hypokin Apex  EF 55% 06/11/08            EGD w/ Food Disimpaction  Food Impaction [facility]   Sm Bulb Ulcer (Dr Ewing Schlein)  06/2009             St Jude mechanical valve per Dr. Laneta Simmers

## 2010-02-27 NOTE — Miscellaneous (Signed)
Summary: med list update- creon  Medications Added CREON 6000 UNIT CPEP (PANCRELIPASE (LIP-PROT-AMYL)) take one by mouth three times a day       Clinical Lists Changes  Medications: Changed medication from * AMYLAS/LIP/PORTEASE three times a day before meals to CREON 6000 UNIT CPEP (PANCRELIPASE (LIP-PROT-AMYL)) take one by mouth three times a day     Prior Medications: CARVEDILOL 3.125 MG TABS (CARVEDILOL) Take one tablet by mouth twice a day FERROUS GLUCONATE 324 MG TABS (FERROUS GLUCONATE) Take 1 tablet by mouth two times a day FLONASE 50 MCG/ACT SUSP (FLUTICASONE PROPIONATE) 2 sprays each nostril daily PANTOPRAZOLE SODIUM 40 MG TBEC (PANTOPRAZOLE SODIUM) Take 1 tablet by mouth once a day CREON 6000 UNIT CPEP (PANCRELIPASE (LIP-PROT-AMYL)) take one by mouth three times a day COZAAR 50 MG TABS (LOSARTAN POTASSIUM) 2  tablet  by mouth once daily COUMADIN 2 MG TABS (WARFARIN SODIUM) Take 1/2 tab on M, W, F & Su.  and 1 tab on all other days. Current Allergies: ! * PRIMAXIN ! ACE INHIBITORS

## 2010-02-27 NOTE — Assessment & Plan Note (Signed)
Summary: PER CHECK OUT/SF      Allergies Added: NKDA  Visit Type:  Follow-up Primary Provider:  Shaune Leeks MD  CC:  Aortic insufficiency/bicuspid aortic valve.  History of Present Illness: The patient presents for followup of the above. Since I last saw him he has had increasing symptoms of decreased exercise tolerance. He's been shorter breath doing activities such as working with a fence post dig or or walking upstairs. He's been generally much more fatigued. He is anxious to get his heart valve fixed because of these symptoms. It also turns out his insurance is changing significantly in June at which point any procedures will cause him much more. He's quite concerned about this. He denies any chest pressure, neck or arm discomfort. He denies any palpitations, presyncope or syncope. He does snore loudly and has daytime fatigue and headaches. He denies any swelling or weight gain.  Current Medications (verified): 1)  Adult Aspirin Ec Low Strength 81 Mg  Tbec (Aspirin) .[name].. 1 By Mouth Daily 2)  Hyzaar 100-12.5 Mg Tabs (Losartan Potassium-Hctz) .... Take One By Mouth Daily 3)  Amlodipine Besylate 5 Mg Tabs (Amlodipine Besylate) .... Take One By Mouth Daily  Allergies (verified): No Known Drug Allergies  Past History:  Past Medical History: Reviewed history from 12/20/2008 and no changes required. Aortic insufficiency with a bicuspid aortic valve   (the last MRI demonstrated the aortic root to be 4.8 x 4.7 cm.   There was moderate regurgitation.  He has normal chamber size with very   mild left ventricular dysfunction), gastroesophageal reflux disease,   dyslipidemia, duodenal ulcer with GI bleeding, hypertension,   obesity.   Past Surgical History: Reviewed history from 06/12/2008 and no changes required. 1987                Ala Mem GI Bleed Gastritis ?Chem, Working for [employer] Dog in Throat  EGD to Remove 1999                Chicken in Throat  EGD  to REmove 01/06/97          EGD (Dr. Kinnie Scales), impaction, dilatation, Schatzki's Ring 01/18/97          Echo LVH, mild dilated  aorta - root  mod to severe Aortic Regurge  EF 50-55% 08/09/98            EGD [name]) - repeat dilatation 01/10/01          EGD stricture/ H.H. duodenal ulcer/bleeding Marina Goodell) 12/25/01          EGD esophagus, stricture, dilated 03/23/01            EGD stricture/dilated 03/10/03            Cardiolite EKG (-)  ?small/apical infarct  EF 50% 2/05                 MRI  LVH  global hypokin  EF 48%  Mod A.I. 09/27/03            Echo - no changes, mod. severe aortic regurg. 07/09/04            Echo, mod. severe aortic regurg. 2-3+  T.R. , T.I.R., mild P.R. 03/08/05              EGD Schatzki's Ring  03/13/05 Schatzki's Ring - dilated, duodenitis 04/01/06  Echo Hypokin Apex  EF 55% 06/11/08            EGD w/ Food Disimpaction  Food Impaction [facility]   Sm Bulb Ulcer (Dr Ewing Schlein)   Review of Systems       As stated in the HPI and negative for all other systems.   Vital Signs:  Patient profile:   60 year old male Height:      71 inches Weight:      253 pounds BMI:     35.41 Pulse rate:   58 / minute Resp:     16 per minute BP sitting:   136 / 70  (right arm)  Vitals Entered By: Marrion Coy, CNA (Jun 05, 2009 10:57 AM)  Physical Exam  General:  Well developed, well nourished, in no acute distress. Head:  normocephalic and atraumatic Eyes:  PERRLA/EOM intact; conjunctiva and lids normal. Mouth:  Teeth, gums and palate normal. Oral mucosa normal. Neck:  Neck supple, no JVD. No masses, thyromegaly or abnormal cervical nodes. Chest Wall:  no deformities or breast masses noted Lungs:  Clear bilaterally to auscultation and percussion. Abdomen:  Bowel sounds positive; abdomen soft and non-tender without masses, organomegaly, or hernias noted. No hepatosplenomegaly, obese Msk:  Back normal, normal gait. Muscle strength and tone normal. Extremities:  No clubbing or  cyanosis. Neurologic:  Alert and oriented x 3. Skin:  Intact without lesions or rashes. Cervical Nodes:  no significant adenopathy Axillary Nodes:  no significant adenopathy Inguinal Nodes:  no significant adenopathy Psych:  Normal affect.   Detailed Cardiovascular Exam  Neck    Carotids: Carotids full and equal bilaterally without bruits.      Neck Veins: Normal, no JVD.    Heart    Inspection: no deformities or lifts noted.      Palpation: normal PMI with no thrills palpable.      Auscultation: S1 and S2 within normal limits, no S3, no S4, no clicks, no rubs, 3/6 lower left sternal border diastolic murmur, no systolic murmurs  Vascular    Abdominal Aorta: no palpable masses, pulsations, or audible bruits.      Femoral Pulses: normal femoral pulses bilaterally.      Pedal Pulses: normal pedal pulses bilaterally.      Radial Pulses: normal radial pulses bilaterally.      Peripheral Circulation: no clubbing, cyanosis, or edema noted with normal capillary refill.     EKG  Procedure date:  06/05/2009  Findings:      sinus rhythm, rate 71, left axis deviation, left anterior fascicular block, poor anterior R-wave progression, PACs, no acute ST-T wave changes.  Impression & Recommendations:  Problem # 1:  SNORING (ICD-786.09) It is almost certain that the patient has some sleep apnea. This is probably more contributing to his fatigue than anything and he will have a sleep study. Orders: Sleep Disorder Referral (Sleep Disorder)  Problem # 2:  AORTIC INSUFFICIENCY (ICD-424.1) He does have more dyspnea. He may well be approaching the need for valve replacement and root repair. I am trying to keep a close eye on this and the next step is a repeat MRI to compare to the one done last year. We discussed the indications for surgery and I will review when this study is available. Orders: EKG w/ Interpretation (93000) Cardiac MRI (Cardiac MRI)  Problem # 3:  OBESITY (ICD-278.00) We  discussed the need for weight loss and he understands this would be through diet and exercise.  Problem # 4:  HYPERTENSION (ICD-401.9) His blood pressure was controlled and he will continue the meds as listed. Orders: EKG w/ Interpretation (93000)  Patient Instructions: 1)  Your physician recommends that you schedule a follow-up appointment after testing 2)  Your physician recommends that you continue on your current medications as directed. Please refer to the Current Medication list given to you today. 3)  Your physician has requested that you have a cardiac MRI.  Cardiac MRI uses a computer to create images of your heart as it's beating, producing both still and moving pictures of your heart and major blood vessels. For further information please visit  [email or web address]  Please follow the instruction sheet given to you today for more information. 4)  Your physician has recommended that you have a sleep study.  This test records several body functions during sleep, including:  brain activity, eye movement, oxygen and carbon dioxide blood levels, heart rate and rhythm, breathing rate and rhythm, the flow of air through your mouth and nose, snoring, body muscle movements, and chest and belly movement.

## 2010-02-27 NOTE — Progress Notes (Signed)
Summary: med for MRI   Phone Note Call from Patient Call back at [phone]   Caller: Spouse Reason for Call: Talk to Nurse Summary of Call: need med called in to relax him for his Cardiac MRI, does not know the name of the drug...Marland KitchenMarland KitchenMarland Kitchen Please call into [facility] Initial call taken by: Migdalia Dk,  Jun 20, 2009 12:10 PM  Follow-up for Phone Call        I spoke with Mr. Alperin ( after talking with Dr. Gala Romney)  and told him a Valium 5 mg was called in for him.  To take the Valium 1 hour before the cardiac MRI.  Also to have someone drive him to the procedure site.  He understands and his wife will be driving him.

## 2010-02-27 NOTE — Letter (Signed)
Summary: Appointment - Cardiac MRI  [facility]     [place], [place]    Phone:   Fax:       Jun 05, 2009 MRN: [id number]   [name] [name] [address] [place], [place]  [address]   Dear [name],   We have scheduled the above patient for an appointment for a Cardiac MRI on May 26,2011 at 2:00 p.m.  Please refer to the below information for the location and instructions for this test:  Location:     [facility]       [address]       [place], [place]  [address] Instructions:    Wilmon Arms at [facility] Outpatient Registration 45 minutes prior to your appointment time.  This will ensure you are in the Radiology Department 30 minutes prior to your appointment.    There are no restrictions for this test you may eat and take medications as usual.  If you need to reschedule this appointment please call at the number listed above.  Sincerely,      Lorne Skeens  [facility] Scheduling Team

## 2010-02-27 NOTE — Medication Information (Signed)
Summary: Coumadin Clinic  Anticoagulant Therapy  Managed by: Cloyde Reams, RN, BSN Referring MD: Antoine Poche PCP: Shaune Leeks MD Supervising MD: Johney Frame MD, Fayrene Fearing Indication 1: Aortic Valve Replacement Indication 2: Atrial Fibrillation Valve Type: St Judes -- Mechanical INR POC 4.2 INR RANGE 2.0-3.0  Dietary changes: no    Health status changes: no    Bleeding/hemorrhagic complications: no    Recent/future hospitalizations: yes       Details: Discharged from hospital 11/15/09.   Any changes in medication regimen? yes       Details: Oxycodone prn pain, changed coreg 6.25mg  bid  Recent/future dental: no  Any missed doses?: no       Is patient compliant with meds? yes      Comments: on INR 2.06 on 11/15/09. Pt held x 1 dose then resumed previous dosage 1mg  daily except 2mg  on Tu, Th, Sa.   Allergies: 1)  ! * Primaxin 2)  ! Ace Inhibitors  Anticoagulation Management History:      His anticoagulation is being managed by telephone today.  Positive risk factors for bleeding include presence of serious comorbidities.  Negative risk factors for bleeding include an age less than 20 years old.  The bleeding index is 'intermediate risk'.  Positive CHADS2 values include History of HTN.  Negative CHADS2 values include Age > 38 years old.  His last INR was 6.6 ratio.  Anticoagulation responsible provider: Melvyn Hommes MD, Fayrene Fearing.  INR POC: 4.2.  Exp: 11/2010.    Anticoagulation Management Assessment/Plan:      The patient's current anticoagulation dose is Coumadin 2 mg tabs: as directed.  The target INR is 2.0-3.0.  The next INR is due 11/23/2009.  Anticoagulation instructions were given to patient.  Results were reviewed/authorized by Cloyde Reams, RN, BSN.  He was notified by Cloyde Reams RN.         Prior Anticoagulation Instructions: INR 6.0 sent to the lab INR 6.61  Pt has not taken today's dosage of Coumadin, instructed pt to hold today's dosage. Called spoke with pt's wife,  advised to hold x 3 days then resume 1/2 tablet daily except 1 tablet on Tuesdays, Thursdays, and Saturdays.  Recheck on Monday.    Current Anticoagulation Instructions: INR 4.2  Called spoke with pt's wife, advised to hold x 2 doses then take 1mg  and recheck on Thursday.  Last [facility] visit on Thursday. Will make f/u appt in [facility] [place].

## 2010-02-27 NOTE — Procedures (Signed)
Summary: ApneaLink  ApneaLink   Imported By: [place]ester [place] Quivira 10/31/2009 08:53:34  _____________________________________________________________________  External Attachment:    Type:   Image     Comment:   External Document

## 2010-02-27 NOTE — Progress Notes (Signed)
Summary: c/o air bubble from sleep apnea  machine**LM/nm    Phone Note Call from Patient Call back at Home Phone [phone]   Caller: Spouse  Reason for Call: Talk to Nurse Details for Reason: pt on sleep apnea machine - order by jh. c/o air bubble , gas on stomach, unable to use machine at night. pt wife contacted [facility] they told pt that the pressure has changed. for wife to contacted office.  Initial call taken by: Lorne Skeens,  September 01, 2009 12:17 PM  Follow-up for Phone Call        pt spouse called again because pt wants to start rehab. the rehab is waiting on papwork to be faxed. Omer Jack  September 01, 2009 1:16 PM   LMTCB. Ollen Gross, RN, BSN  September 01, 2009 1:41 PM   Additional Follow-up for Phone Call Additional follow up Details #1::        pt aware he is ok to start rehab, called [facility] rehab they are just waiting on order to be signed by Dr Antoine Poche, they are aware Dr Antoine Poche is in office on Fri and will sign form then.  Pt also states that he can not tolerate his cpap machine, he feels like it is pushing air into his belly, causing gas and bloating, he says that since his surgery he has been feeling so much better and doesn't feel he needs it anymore, will let Dr Atiyah Bauer know Meredith Staggers, RN  September 04, 2009 9:24 AM

## 2010-02-27 NOTE — Medication Information (Signed)
Summary: rov/ewj  Anticoagulant Therapy  Managed by: Weston Brass, PharmD Referring MD: Antoine Poche PCP: Shaune Leeks MD Supervising MD: Juanda Chance MD, Bruce Indication 1: Aortic Valve Replacement Indication 2: Atrial Fibrillation Valve Type: St Judes -- Mechanical INR POC 2.7 INR RANGE 2.0-3.0  Dietary changes: no    Health status changes: no    Bleeding/hemorrhagic complications: no    Recent/future hospitalizations: yes       Details: Dehydrated, infection, started on ABT on 10/28  Any changes in medication regimen? yes       Details: Started ABT for infection, 11/04 is the last day  Recent/future dental: no  Any missed doses?: no       Is patient compliant with meds? yes       Allergies (verified): 1)  ! * Primaxin 2)  ! Ace Inhibitors  Anticoagulation Management History:      The patient is taking warfarin and comes in today for a routine follow up visit.  Positive risk factors for bleeding include presence of serious comorbidities.  Negative risk factors for bleeding include an age less than 41 years old.  The bleeding index is 'intermediate risk'.  Positive CHADS2 values include History of HTN.  Negative CHADS2 values include Age > 58 years old.  His last INR was 6.6 ratio.  Anticoagulation responsible provider: Juanda Chance MD, Smitty Cords.  INR POC: 2.7.  Cuvette Lot#: [id number].  Exp: 11/2010.    Anticoagulation Management Assessment/Plan:      The patient's current anticoagulation dose is Coumadin 2 mg tabs: as directed.  The target INR is 2.0-3.0.  The next INR is due 12/13/2009.  Anticoagulation instructions were given to patient.  Results were reviewed/authorized by Weston Brass, PharmD.  He was notified by Hoy Register, PharmD Candidate.         Prior Anticoagulation Instructions: INR 4.2  Called spoke with pt's wife, advised to hold x 2 doses then take 1mg  and recheck on Thursday.  Last [facility] visit on Thursday. Will make f/u appt in [facility] [place].    Current Anticoagulation  Instructions: INR 2.7 Continue previous dose 0.5 tablets everyday except 1 tabet on Tuesday, Thursday and Saturday. Recheck INR in 2 weeks.

## 2010-02-27 NOTE — Miscellaneous (Signed)
Clinical Lists Changes  Observations: Added new observation of CXR RESULTS: 1.  Interval mediastinal drain placement.   2.  Low volumes with interval increase in bibasilar   atelectasis/consolidation.   3.  Stable cardiomegaly. (08/07/2009 11:08) Added new observation of ECHOINTERP:  - Technically difficult study with poor acoustic windows. Normal LV     size with EF 35-40% and wall motion abnormalities as listed above.     Status post Bental with mechanical aortic valve. The gradient     across the valve was not measured. There was a large pericardial     effusion (3.0 cm), but it was primarily posteriorly located. No     evidence for hemodynamic tamponade. The IVC was small. There was     septal bounce but this did not appear particularly respirophasic.     If effusive-constrictive pericarditis is a concern, would suggest     hemodynamic right and left heart cath.  (08/07/2009 11:06) Added new observation of CARDCATHFIND: Hemodynamics:  RA mean 3, RV 24/0, PA 21/5 with a mean of 14, pulmonary capillary wedge pressure mean 11, AO 144/68, LV 149/23. Coronaries:  The left main was short and normal.  The LAD was somewhat small vessel not wrapping the apex.  It had three small diagonals which were normal.  The circumflex was dominant.  First and second obtuse marginal were moderate size and normal.  The posterolateral 1 and posterolateral 2 were moderate size and normal.  The PDA was moderate size and normal.  The right coronary artery was seen via flush injection that was not well displayed on the cine but well seen on fluoroscopy. It was noted to be a small nondominant vessel and was clearly free of disease.  Left ventriculogram:  The left ventriculogram was obtained in the RAO projection.  The EF was about 50% with mild global hypokinesis.   CONCLUSION:  Normal coronary arteries.  Mildly reduced ejection fraction.  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  The aortic  insufficiency and aortic root dilatation are previously been described in the MRI.   PLAN:  The patient will be referred for aortic valve and root replacement. (06/29/2009 10:59) Added new observation of RESULTS MISC:  1) Severe LV cavity enlargement. Although EF only mildly    decreased and similar to 4/10 the cavity sizes have increased    significantly. ( ESD 43mm compared to 38mm and the EDD 58mm    compared to 52mm)     2) Moderate to severe proximal aortic root dilatation 4.8 x4.6    axially at MPA level which is stable compared to 4/10    3) Bicuspid AV with significant AR. Although regurgitant    fraction only 23% there is holo-diastolic flow reversal and    premature closure of anterior leaflet of MV    Given change in clinical symptoms and apparent LV enlargement it    would appear that right and left heart cath with eye toward Bental    procedure is appropriate at this time.  (06/22/2009 11:07) Added new observation of RESULTS MISC:  1) Severe LV cavity enlargement. Although EF only mildly    decreased and similar to 4/10 the cavity sizes have increased    significantly. ( ESD 43mm compared to 38mm and the EDD 58mm    compared to 52mm)     2) Moderate to severe proximal aortic root dilatation 4.8 x4.6    axially at MPA level which is stable compared to 4/10  3) Bicuspid AV with significant AR. Although regurgitant    fraction only 23% there is holo-diastolic flow reversal and    premature closure of anterior leaflet of MV    Given change in clinical symptoms and apparent LV enlargement it    would appear that right and left heart cath with eye toward Bental    procedure is appropriate at this time.  (06/22/2009 11:06) Added new observation of RESULTS MISC:  Findings:  There was severe LV cavity enlargement with mild LVH. There was diffuse mild hypokinesis worse in the lateral wall.  The quantitative EF was 51%.( ESV 92, EDV 189, SV 97cc ).  The LA/RA and RV were  normal.  The aortic valve was bicuspid with significant aortic insuficiency.  There was holo-diastolic flow reversal on flow curves.  The ascending aortic root was moderately to markedly dilated measuring 4.8 x 4.6 axially at the level of the PA bifurcation.  Aortic MRA confirms significant aortic root dilatation with normal origin of the great vessels.  The remainder of the aorta was of normal size.  The sinus of valsalva was also dilated above the bicuspid valve at 4.2 cm.  The calculated regurgiatant fraction was only 23% which is likely under-estimated.   Arch: 2.5 cm   Descending thoracic aorta 2.2 cm   Impression:   1)    Severe LV cavity enlargement.  Although EF only mildly decreased and similar to 4/10 the cavity sizes have increased significantly.  ( ESD 43mm compared to 38mm and the EDD 58mm compared to 52mm)   2)    Moderate to severe proximal aortic root dilatation 4.8 x4.6 axially at MPA level which is stable compared to 4/10 3)    Bicuspid AV with significant AR.  Although regurgitant fraction only 23% there is holo-diastolic flow reversal and premature closure of anterior leaflet of MV Given change in clinical symptoms and apparent LV enlargement it would appear that right and left heart cath with eye toward Bental procedure is appropriate at this time.   (06/22/2009 10:58)      MISC. Report  Procedure date:  06/22/2009  Findings:       Findings:  There was severe LV cavity enlargement with mild LVH. There was diffuse mild hypokinesis worse in the lateral wall.  The quantitative EF was 51%.( ESV 92, EDV 189, SV 97cc ).  The LA/RA and RV were normal.  The aortic valve was bicuspid with significant aortic insuficiency.  There was holo-diastolic flow reversal on flow curves.  The ascending aortic root was moderately to markedly dilated measuring 4.8 x 4.6 axially at the level of the PA bifurcation.  Aortic MRA confirms significant aortic root dilatation  with normal origin of the great vessels.  The remainder of the aorta was of normal size.  The sinus of valsalva was also dilated above the bicuspid valve at 4.2 cm.  The calculated regurgiatant fraction was only 23% which is likely under-estimated.   Arch: 2.5 cm   Descending thoracic aorta 2.2 cm   Impression:   1)    Severe LV cavity enlargement.  Although EF only mildly decreased and similar to 4/10 the cavity sizes have increased significantly.  ( ESD 43mm compared to 38mm and the EDD 58mm compared to 52mm)   2)    Moderate to severe proximal aortic root dilatation 4.8 x4.6 axially at MPA level which is stable compared to 4/10 3)    Bicuspid AV with significant AR.  Although regurgitant fraction  only 23% there is holo-diastolic flow reversal and premature closure of anterior leaflet of MV Given change in clinical symptoms and apparent LV enlargement it would appear that right and left heart cath with eye toward Bental procedure is appropriate at this time.    Cardiac Cath  Procedure date:  06/29/2009  Findings:      Hemodynamics:  RA mean 3, RV 24/0, PA 21/5 with a mean of 14, pulmonary capillary wedge pressure mean 11, AO 144/68, LV 149/23. Coronaries:  The left main was short and normal.  The LAD was somewhat small vessel not wrapping the apex.  It had three small diagonals which were normal.  The circumflex was dominant.  First and second obtuse marginal were moderate size and normal.  The posterolateral 1 and posterolateral 2 were moderate size and normal.  The PDA was moderate size and normal.  The right coronary artery was seen via flush injection that was not well displayed on the cine but well seen on fluoroscopy. It was noted to be a small nondominant vessel and was clearly free of disease.  Left ventriculogram:  The left ventriculogram was obtained in the RAO projection.  The EF was about 50% with mild global hypokinesis.   CONCLUSION:  Normal coronary  arteries.  Mildly reduced ejection fraction.  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  The aortic insufficiency and aortic root dilatation are previously been described in the MRI.   PLAN:  The patient will be referred for aortic valve and root replacement.  Echocardiogram  Procedure date:  08/07/2009  Findings:       - Technically difficult study with poor acoustic windows. Normal LV     size with EF 35-40% and wall motion abnormalities as listed above.     Status post Bental with mechanical aortic valve. The gradient     across the valve was not measured. There was a large pericardial     effusion (3.0 cm), but it was primarily posteriorly located. No     evidence for hemodynamic tamponade. The IVC was small. There was     septal bounce but this did not appear particularly respirophasic.     If effusive-constrictive pericarditis is a concern, would suggest     hemodynamic right and left heart cath.   MISC. Report  Procedure date:  06/22/2009  Findings:       1) Severe LV cavity enlargement. Although EF only mildly    decreased and similar to 4/10 the cavity sizes have increased    significantly. ( ESD 43mm compared to 38mm and the EDD 58mm    compared to 52mm)     2) Moderate to severe proximal aortic root dilatation 4.8 x4.6    axially at MPA level which is stable compared to 4/10    3) Bicuspid AV with significant AR. Although regurgitant    fraction only 23% there is holo-diastolic flow reversal and    premature closure of anterior leaflet of MV    Given change in clinical symptoms and apparent LV enlargement it    would appear that right and left heart cath with eye toward Bental    procedure is appropriate at this time.   MISC. Report  Procedure date:  06/22/2009  Findings:       1) Severe LV cavity enlargement. Although EF only mildly    decreased and similar to 4/10 the cavity sizes have increased    significantly. ( ESD 43mm compared to 38mm  and the EDD 58mm    compared to 52mm)     2) Moderate to severe proximal aortic root dilatation 4.8 x4.6    axially at MPA level which is stable compared to 4/10    3) Bicuspid AV with significant AR. Although regurgitant    fraction only 23% there is holo-diastolic flow reversal and    premature closure of anterior leaflet of MV    Given change in clinical symptoms and apparent LV enlargement it    would appear that right and left heart cath with eye toward Bental    procedure is appropriate at this time.   CXR  Procedure date:  08/07/2009  Findings:      1.  Interval mediastinal drain placement.   2.  Low volumes with interval increase in bibasilar   atelectasis/consolidation.   3.  Stable cardiomegaly.

## 2010-02-27 NOTE — Cardiovascular Report (Signed)
Summary: Pre-Cath Orders  Pre-Cath Orders   Imported By: Debby Freiberg 07/22/2009 09:56:53  _____________________________________________________________________  External Attachment:    Type:   Image     Comment:   External Document

## 2010-02-27 NOTE — Miscellaneous (Signed)
  Clinical Lists Changes  Observations: Added new observation of ECHOINTERP:   - Left ventricle: The cavity size was mildly dilated. Wall thickness       was increased in a pattern of mild LVH. Systolic function was       normal. The estimated ejection fraction was in the range of 55% to       60%. Wall motion was normal; there were no regional wall motion       abnormalities. Doppler parameters are consistent with abnormal       left ventricular relaxation (grade 1 diastolic dysfunction).     - Aortic valve: Bicuspid; normal thickness leaflets. Moderate       regurgitation.     - Ascending aorta: The ascending aorta was moderately dilated.     - Mitral valve: Mild regurgitation.     Impressions:            - Probable bicuspid aortic valve; eccentric AI difficult to       quantitate but most likely moderate; moderately dilated ascending       aorta (suggest CTA or MRA to better size). (01/05/2009 11:03)      Echocardiogram  Procedure date:  01/05/2009  Findings:        - Left ventricle: The cavity size was mildly dilated. Wall thickness       was increased in a pattern of mild LVH. Systolic function was       normal. The estimated ejection fraction was in the range of 55% to       60%. Wall motion was normal; there were no regional wall motion       abnormalities. Doppler parameters are consistent with abnormal       left ventricular relaxation (grade 1 diastolic dysfunction).     - Aortic valve: Bicuspid; normal thickness leaflets. Moderate       regurgitation.     - Ascending aorta: The ascending aorta was moderately dilated.     - Mitral valve: Mild regurgitation.     Impressions:            - Probable bicuspid aortic valve; eccentric AI difficult to       quantitate but most likely moderate; moderately dilated ascending       aorta (suggest CTA or MRA to better size).

## 2010-02-27 NOTE — Miscellaneous (Signed)
  Clinical Lists Changes  Observations: Added new observation of ECHOINTERP:  - Left ventricle: The cavity size was normal. Wall thickness was       normal. The estimated ejection fraction was 60%. Wall motion was       normal; there were no regional wall motion abnormalities. Doppler       parameters are consistent with abnormal left ventricular       relaxation (grade 1 diastolic dysfunction).     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 There is turbulent MR that tracts behind the       anterior leaflet of the mitral valve. I suspect that the MR is       moderate.     - Pulmonary arteries: PA peak pressure: 33mm Hg (S). (09/12/2009 10:26) Added new observation of US CAROTID:  - No significant extracranial carotid artery stenosis demonstrated.     Veterbrals are patent with antegrade flow.   - Normal upper arterial extremity evaluation. (07/06/2009 10:27)      Echocardiogram  Procedure date:  09/12/2009  Findings:       - Left ventricle: The cavity size was normal. Wall thickness was       normal. The estimated ejection fraction was 60%. Wall motion was       normal; there were no regional wall motion abnormalities. Doppler       parameters are consistent with abnormal left ventricular       relaxation (grade 1 diastolic dysfunction).     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 There is turbulent MR that tracts behind the       anterior leaflet of the mitral valve. I suspect that the MR is       moderate.     - Pulmonary arteries: PA peak pressure: 33mm Hg (S).  Carotid Doppler  Procedure date:  07/06/2009  Findings:       - No significant extracranial carotid artery stenosis demonstrated.     Veterbrals are patent with antegrade flow.   - Normal upper arterial extremity evaluation.

## 2010-02-27 NOTE — Miscellaneous (Signed)
   Clinical Lists Changes  Observations: Added new observation of PAST MED HX: Aortic insufficiency with a bicuspid aortic valve (the last MRI demonstrated the aortic root to be 4.8 x 4.7 cm.  There was moderate regurgitation.  He has normal chamber size with very mild left ventricular dysfunction) gastroesophageal reflux disease dyslipidemia  duodenal ulcer with GI bleeding hypertension,  obesity.  07/2009-Admisson for Pericardial effusion. Status post pericardial window  Acute pancreatitis with pancreatic necrosis. 08/2009- Admission for fever and syncope.  No source for fever seen on cultures.  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  There was moderate regurgitation.  He has normal chamber size with very mild left ventricular dysfunction) gastroesophageal reflux disease dyslipidemia  duodenal ulcer with GI bleeding hypertension,  obesity.  07/2009-Admisson for Pericardial effusion. Status post pericardial window  Acute pancreatitis with pancreatic necrosis. 08/2009- Admission for fever and syncope.  No source for fever seen on cultures.  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

## 2010-02-27 NOTE — Assessment & Plan Note (Signed)
Summary: eph/per pt call/lg  Medications Added COREG 6.25 MG TABS (CARVEDILOL) 1 by mouth two times a day FUROSEMIDE 20 MG TABS (FUROSEMIDE) as needed for weight gain OXYCODONE HCL 5 MG TABS (OXYCODONE HCL) as needed      Allergies Added:   Visit Type:  Follow-up Primary Provider:  Shaune Leeks MD  CC:  AVR.  History of Present Illness: The patient presents for followup following aortic valve replacement. These had a complicated course with pericardial effusion and pleural effusion. He had subsequent pancreatitis. He is making a slow recovery though he still quite fatigued. The last day or so he's been weak and dizzy. He was in the emergency room in late October with dehydration. I reviewed these records. He saw Dr.Bartle recently and a chest x-ray demonstrated a small cardiac silhouette and resolution of his pleural effusion. I reviewed this. His last echo in late September demonstrated his EF to be 35-40% with a moderate effusion. He denies any shortness of breath and has had no PND or orthopnea. He is not having any chest pressure, neck or arm discomfort. He is not having any abdominal pain he was having though he is starting to get a sense of fullness which preceded his pancreatitis. He is starting to do minimal activity around the house just walking in the house for exercise.  Current Medications (verified): 1)  Coreg 6.25 Mg Tabs (Carvedilol) .[name].. 1 By Mouth Two Times A Day 2)  Ferrous Gluconate 324 Mg Tabs (Ferrous Gluconate) .... Take 1 Tablet By Mouth Two Times A Day 3)  Flonase 50 Mcg/act Susp (Fluticasone Propionate) .... 2 Sprays Each Nostril Daily 4)  Pantoprazole Sodium 40 Mg Tbec (Pantoprazole Sodium) .... Take 1 Tablet By Mouth Once A Day 5)  Creon 6000 Unit Cpep (Pancrelipase (Lip-Prot-Amyl)) .... Take One By Mouth Three Times A Day 6)  Coumadin 2 Mg Tabs (Warfarin Sodium) .... As Directed 7)  Mucinex 600 Mg Xr12h-Tab (Guaifenesin) .... 2 By Mouth Dailyu 8)   Tussionex Pennkinetic Er 10-8 Mg/70ml Lqcr (Hydrocod Polst-Chlorphen Polst) .... As Needed 9)  Ventolin Hfa 108 (90 Base) Mcg/act Aers (Albuterol Sulfate) .... As Directed 10)  Alprazolam 0.5 Mg Tabs (Alprazolam) .... As Needed 11)  Furosemide 20 Mg Tabs (Furosemide) .... One Daily 12)  Potassium Chloride Crys Cr 20 Meq Cr-Tabs (Potassium Chloride Crys Cr) .... One Daily 13)  Oxycodone Hcl 5 Mg Tabs (Oxycodone Hcl) .... As Needed  Allergies (verified): 1)  ! * Primaxin 2)  ! Ace Inhibitors  Past History:  Past Medical History: Reviewed history from 10/30/2009 and no changes required. Aortic insufficiency with a bicuspid aortic valve (the last MRI demonstrated the aortic root to be 4.8 x 4.7 cm.  There was moderate regurgitation.  He has normal chamber size with very mild left ventricular dysfunction) gastroesophageal reflux disease dyslipidemia  duodenal ulcer with GI bleeding hypertension,  obesity.  07/2009-Admisson for Pericardial effusion. Status post pericardial window  Acute pancreatitis with pancreatic necrosis. 08/2009- Admission for fever and syncope.  No source for fever seen on cultures.  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 1987                Ala Mem GI Bleed Gastritis ?Chem, Working for [employer]  Hot Dog in Throat  EGD to Remove 1999                Chicken in Throat  EGD to REmove 01/06/97          EGD (Dr. Kinnie Scales), impaction, dilatation, Schatzki's Ring 01/18/97          Echo LVH, mild dilated  aorta - root  mod to severe Aortic Regurge  EF 50-55% 08/09/98            EGD [name]) - repeat dilatation 01/10/01          EGD stricture/ H.H. duodenal ulcer/bleeding Marina Goodell) 12/25/01           EGD esophagus, stricture, dilated 03/23/01            EGD stricture/dilated 03/10/03            Cardiolite EKG (-)  ?small/apical infarct  EF 50% 2/05                 MRI  LVH  global hypokin  EF 48%  Mod A.I. 09/27/03            Echo - no changes, mod. severe aortic regurg. 07/09/04            Echo, mod. severe aortic regurg. 2-3+  T.R. , T.I.R., mild P.R. 03/08/05              EGD Schatzki's Ring  03/13/05 Schatzki's Ring - dilated, duodenitis 04/01/06              Echo Hypokin Apex  EF 55% 06/11/08            EGD w/ Food Disimpaction  Food Impaction Ms HH   Sm Bulb Ulcer (Dr Ewing Schlein)  06/2009             St Jude mechanical valve per Dr. Laneta Simmers  Review of Systems       As stated in the HPI and negative for all other systems.   Vital Signs:  Patient profile:   60 year old male Height:      71 inches Weight:      189 pounds BMI:     26.46 Pulse rate:   86 / minute Resp:     16 per minute BP sitting:   98 / 62  (right arm)  Vitals Entered By: Marrion Coy, CNA (December 12, 2009 10:23 AM)  Physical Exam  General:  Chronically ill-appearing but in no distress Head:  normocephalic and atraumatic Neck:  Neck supple, no JVD. No masses, thyromegaly or abnormal cervical nodes. Chest Wall:  Well-healed sternotomy scar Abdomen:  Bowel sounds positive; abdomen soft and non-tender without masses, organomegaly, or hernias noted. No hepatosplenomegaly. Msk:  Back normal, normal gait. Muscle strength and tone reduced. Extremities:  Mild ankle edema Neurologic:  Alert and oriented x 3. Skin:  Intact without lesions or rashes. Cervical Nodes:  no significant adenopathy Axillary Nodes:  no significant adenopathy Inguinal Nodes:  no significant adenopathy Psych:  Normal affect.   Detailed Cardiovascular Exam  Neck    Carotids: Carotids full and equal bilaterally without bruits.      Neck Veins: Normal, no JVD.    Heart    Inspection: no deformities or lifts noted.      Palpation: normal  PMI with no thrills palpable.      Auscultation: Normal S1, mechanical S2, 2/6 apical systolic murmur, no rubs  Vascular    Abdominal Aorta: no  palpable masses, pulsations, or audible bruits.      Femoral Pulses: normal femoral pulses bilaterally.      Pedal Pulses: normal pedal pulses bilaterally.      Radial Pulses: normal radial pulses bilaterally.      Peripheral Circulation: no clubbing, cyanosis.  Mild edema.   EKG  Procedure date:  12/12/2009  Findings:      Sinus rhythm, rate 86, lateral infarct, anteroseptal infarct, no acute ST-T wave changes. Of note the lateral Q waves are new compared to previous though the poor anterior R-wave progression was present previously.  Impression & Recommendations:  Problem # 1:  PERICARDIAL EFFUSION (ICD-423.9) He has no signs of recurrent effusion. I plan to repeat an echo in January or sooner if he has any suggestive symptoms. Orders: EKG w/ Interpretation (93000) Echocardiogram (Echo)  Problem # 2:  AORTIC INSUFFICIENCY (ICD-424.1) He is status post aortic valve replacement.  No change in therapy is indicated. Orders: EKG w/ Interpretation (93000) Echocardiogram (Echo)  Problem # 3:  ATRIAL FIBRILLATION (ICD-427.31) He has had no symptomatic arrhythmias. No change in therapy is indicated. Orders: EKG w/ Interpretation (93000) Echocardiogram (Echo)  Problem # 4:  HYPOTENSION (ICD-458.9) He was dehydrated couple of weeks ago. He may be volume depleted now. I will have him start taking his Lasix only if he gains 2 pounds in a day. He will stop his potassium and take it only if he takes the Lasix. I gave strict instructions about daily weights. I reviewed his most recent basic metabolic profile from 11/3.  Patient Instructions: 1)  Your physician recommends that you schedule a follow-up appointment in: Mid January with a 2 DEcho 2)  Your physician has recommended you make the following change in your medication:  3)  Your physician  has requested that you have an echocardiogram (due mid January).  Echocardiography is a painless test that uses sound waves to create images of your heart. It provides your doctor with information about the size and shape of your heart and how well your heart's chambers and valves are working.  This procedure takes approximately one hour. There are no restrictions for this procedure.

## 2010-02-27 NOTE — Letter (Signed)
Summary: Nadara Eaton letter  [place] at [facility]  [address] [place], [place] [address]   Phone: [phone]  Fax: [phone]       09/04/2009 MRN: [id number]  LAWERENCE DERY [address] [place], [place]  [address]  Dear Mr. [facility] [place], and [place] announce the retirement of Arta Silence, M.D., from full-time practice at the [facility] office effective July 27, 2009 and his plans of returning part-time.  It is important to Dr. Hetty Ely and to our practice that you understand that [facility] has seven physicians in our office for your health care needs.  We will continue to offer the same exceptional care that you have today.    Dr. Hetty Ely has spoken to many of you about his plans for retirement and returning part-time in the fall.   We will continue to work with you through the transition to schedule appointments for you in the office and meet the high standards that [facility] is committed to.   Again, it is with great pleasure that we share the news that Dr. Hetty Ely will return to [facility] at [facility] in October of 2011 with a reduced schedule.    If you have any questions, or would like to request an appointment with one of our physicians, please call us at [phone] and press the option for Scheduling an appointment.  We take pleasure in providing you with excellent patient care and look forward to seeing you at your next office visit.  Our [facility] Physicians are:  Tillman Abide, M.D. Laurita Quint, M.D. Roxy Manns, M.D. Kerby Nora, M.D. Hannah Beat, M.D. Ruthe Mannan, M.D. We proudly welcomed Raechel Ache, M.D. and Eustaquio Boyden, M.D. to the practice in July/August 2011.  Sincerely,  [place] [facility]

## 2010-02-27 NOTE — Progress Notes (Signed)
Summary: FYI--pt at [facility] rm 3309  [id number]  Phone Note Call from Patient Call back at Home Phone [phone]   Caller: Spouse//alice Call For: clance Summary of Call:    FYI:Pt wanted KC to know that her husband was admitted to [facility] about a week ago in rm 3309. Initial call taken by: Darletta Moll,  November 14, 2009 9:28 AM  Follow-up for Phone Call        called and spoke with pt's spouse, Fulton Mole.  Fulton Mole states pt is currently at [facility] rm 3309.  Wife states pt was dx with "fluid around heart and lungs."  Wife states pt is feeling "better" and is expected to be d/c'd this Friday 11/17/2009.  Wife just wanted this message forwarded to [facility] as an [place].  Aundra Millet Reynolds LPN  November 14, 2009 9:50 AM   Additional Follow-up for Phone Call Additional follow up Details #1::        noted.  He needs ov to discuss sleep study results once he gets out of hospital Additional Follow-up by: Barbaraann Share MD,  November 14, 2009 12:49 PM    Additional Follow-up for Phone Call Additional follow up Details #2::    LM with family member for pt's spouse to call back.   Aundra Millet Reynolds LPN  November 14, 2009 1:09 PM   family member took msg for pt to call our office and schedule ov to discuss sleep study once he gets out of hospital Follow-up by: Philipp Deputy CMA,  November 16, 2009 5:00 PM

## 2010-02-27 NOTE — Assessment & Plan Note (Signed)
Summary: per check out/sf  Medications Added COUMADIN 2 MG TABS (WARFARIN SODIUM) as directed MUCINEX 600 MG XR12H-TAB (GUAIFENESIN) 2 by mouth dailyu TUSSIONEX PENNKINETIC ER 10-8 MG/5ML LQCR (HYDROCOD POLST-CHLORPHEN POLST) as needed VENTOLIN HFA 108 (90 BASE) MCG/ACT AERS (ALBUTEROL SULFATE) as directed ALPRAZOLAM 0.5 MG TABS (ALPRAZOLAM) as needed FUROSEMIDE 20 MG TABS (FUROSEMIDE) one daily POTASSIUM CHLORIDE CRYS CR 20 MEQ CR-TABS (POTASSIUM CHLORIDE CRYS CR) one daily FIRST-BXN MOUTHWASH  SUSP (DIPHENHYD-LIDOCAINE-NYSTATIN) 15 ml swish and swallow three times a day      Allergies Added:   Visit Type:  Follow-up Primary Provider:  Shaune Leeks MD  CC:  AVR.  History of Present Illness: The patient presents for followup. Unfortunately he's had quite a complicated course and valve replacement. He had a pericardial effusion which required a pericardial window. He has had recurrent hemorrhagic pancreatitis with severe illness related to this. He was just discharged from the hospital after treatment for an apparent pneumonia with pleural effusion requiring thoracentesis. I reviewed these records and noted that an echocardiogram done demonstrated recurrent pericardial effusion moderate with some evidence of right atrial collapse and respiratory variation of mitral inflow. He was discharged apparently without this being addressed.  Today he returns for followup. He is profoundly weak. He has tachypnea but does not report being particularly short of breath. He has decreased energy. He is denying any PND or orthopnea. He is denying any chest pressure, neck or arm discomfort. He has had no palpitations, presyncope or syncope. He has had slowly increasing lower extremity edema.  Current Medications (verified): 1)  Carvedilol 3.125 Mg Tabs (Carvedilol) .... Take One Tablet By Mouth Twice A Day 2)  Ferrous Gluconate 324 Mg Tabs (Ferrous Gluconate) .... Take 1 Tablet By Mouth Two Times A  Day 3)  Flonase 50 Mcg/act Susp (Fluticasone Propionate) .... 2 Sprays Each Nostril Daily 4)  Pantoprazole Sodium 40 Mg Tbec (Pantoprazole Sodium) .... Take 1 Tablet By Mouth Once A Day 5)  Creon 6000 Unit Cpep (Pancrelipase (Lip-Prot-Amyl)) .... Take One By Mouth Three Times A Day 6)  Coumadin 2 Mg Tabs (Warfarin Sodium) .... As Directed 7)  Mucinex 600 Mg Xr12h-Tab (Guaifenesin) .... 2 By Mouth Dailyu 8)  Tussionex Pennkinetic Er 10-8 Mg/27ml Lqcr (Hydrocod Polst-Chlorphen Polst) .... As Needed 9)  Ventolin Hfa 108 (90 Base) Mcg/act Aers (Albuterol Sulfate) .... As Directed 10)  Alprazolam 0.5 Mg Tabs (Alprazolam) .... As Needed  Allergies (verified): 1)  ! * Primaxin 2)  ! Ace Inhibitors  Past History:  Past Medical History: Reviewed history from 10/30/2009 and no changes required. Aortic insufficiency with a bicuspid aortic valve (the last MRI demonstrated the aortic root to be 4.8 x 4.7 cm.  There was moderate regurgitation.  He has normal chamber size with very mild left ventricular dysfunction) gastroesophageal reflux disease dyslipidemia  duodenal ulcer with GI bleeding hypertension,  obesity.  07/2009-Admisson for Pericardial effusion. Status post pericardial window  Acute pancreatitis with pancreatic necrosis. 08/2009- Admission for fever and syncope.  No source for fever seen on cultures.  Syncope thought to be related to orthostasis 09/2009 admission to [facility] with necrotizing pancreatitis and pseudocyst formation, also with ATN 10/2009 admission to [facility] with pneumonia and right pleural effusion status post thoracentesis  Past Surgical History: Reviewed history from 08/18/2009 and no changes required. 1987                Ala Mem GI Bleed Gastritis ?Chem, Working for [employer]  Hot Dog in Throat  EGD to Remove 1999                Chicken in Throat  EGD to REmove 01/06/97          EGD (Dr. Kinnie Scales), impaction, dilatation, Schatzki's Ring 01/18/97          Echo  LVH, mild dilated  aorta - root  mod to severe Aortic Regurge  EF 50-55% 08/09/98            EGD [name]) - repeat dilatation 01/10/01          EGD stricture/ H.H. duodenal ulcer/bleeding Marina Goodell) 12/25/01          EGD esophagus, stricture, dilated 03/23/01            EGD stricture/dilated 03/10/03            Cardiolite EKG (-)  ?small/apical infarct  EF 50% 2/05                 MRI  LVH  global hypokin  EF 48%  Mod A.I. 09/27/03            Echo - no changes, mod. severe aortic regurg. 07/09/04            Echo, mod. severe aortic regurg. 2-3+  T.R. , T.I.R., mild P.R. 03/08/05              EGD Schatzki's Ring  03/13/05 Schatzki's Ring - dilated, duodenitis 04/01/06              Echo Hypokin Apex  EF 55% 06/11/08            EGD w/ Food Disimpaction  Food Impaction Ms HH   Sm Bulb Ulcer (Dr Ewing Schlein)  06/2009             St Jude mechanical valve per Dr. Laneta Simmers  Review of Systems       As stated in the HPI and negative for all other systems.   Vital Signs:  Patient profile:   60 year old male Height:      71 inches Weight:      225 pounds BMI:     31.49 Pulse rate:   104 / minute Resp:     16 per minute BP sitting:   131 / 83  (left arm)  Vitals Entered By: Marrion Coy, CNA (November 02, 2009 9:29 AM)  Physical Exam  General:  Chronically ill-appearing Head:  normocephalic and atraumatic Eyes:  PERRLA/EOM intact; conjunctiva and lids normal. Mouth:  oral ulcers Neck:  Neck supple, no JVD. No masses, thyromegaly or abnormal cervical nodes. Chest Wall:  Well-healed sternotomy scar Lungs:  Bilateral decreased breath sounds without crackles or wheezing Abdomen:  Bowel sounds positive; abdomen soft and non-tender without masses, organomegaly, or hernias noted. No hepatosplenomegaly. Msk:  Back normal, normal gait. Muscle strength and tone reduced. Extremities:  moderate to severe bilateral lower extremity edema above the knees Neurologic:  Alert and oriented x 3. Skin:  Intact without lesions or  rashes. Cervical Nodes:  no significant adenopathy Psych:  Normal affect.   Detailed Cardiovascular Exam  Neck    Carotids: Carotids full and equal bilaterally without bruits.      Neck Veins: Normal, no JVD.    Heart    Inspection: no deformities or lifts noted.      Palpation: normal PMI with no thrills palpable.      Auscultation: Normal S1, mechanical S2,  2/6 apical systolic murmur, no rubs  Vascular    Abdominal Aorta: no palpable masses, pulsations, or audible bruits.      Femoral Pulses: normal femoral pulses bilaterally.      Pedal Pulses: normal pedal pulses bilaterally.     Impression & Recommendations:  Problem # 1:  PERICARDIAL EFFUSION (ICD-423.9) I reviewed this with Dr. Laneta Simmers today. The patient had no pulsus paradox when I checked this myself. He has not been hypotensive and is not particularly short of breath though he looks chronically ill and very weak. I do suspect he is going to need to have this fluid drained. Dr. Laneta Simmers will see him tomorrow in the office and make further decisions. I did give him a couple of days' worth of diuretic because of his significant lower extremity swelling. I would like to see him back in this office in a few days pending the decisions by Dr. Laneta Simmers.  I discussed with the patient and his wife that should he develop any symptoms of lightheadedness, presyncope or more weakness he needs to present to the emergency room.  Problem # 2:  PALPITATIONS (ICD-785.1) He did wear a monitor since I last saw him which demonstrated SVT. However, he was not mentioned thatal which he was admitted recently. He is having no overt symptoms from this and I would not change therapy at this time. Orders: TLB-BMP (Basic Metabolic Panel-BMET) (80048-METABOL) TLB-CBC Platelet - w/Differential (85025-CBCD)  Problem # 3:  ATRIAL FIBRILLATION (ICD-427.31) the coagulation. Orders: TLB-BMP (Basic Metabolic Panel-BMET) (80048-METABOL) TLB-CBC Platelet -  w/Differential (85025-CBCD)  Problem # 4:  AORTIC INSUFFICIENCY (ICD-424.1) He has had a normally functioning valve replacement. There's been no fevers or chills or suggestion of endocarditis. For now no further imaging is indicated.  Problem # 5:  ACUTE PANCREATITIS (ICD-577.0) He has had severe illness with hemorrhagic pancreatitis. He continues to have this followed by his other physicians.  Patient Instructions: 1)  Your physician recommends that you schedule a follow-up appointment on Monday 2)  Your physician recommends that you have lab work today BMP, CBC 3)  Your physician has recommended you make the following change in your medication: Take Furosemide 20 mg one every am and potassium chloride 20 Meq one a day thru the weekend Prescriptions: FIRST-BXN MOUTHWASH  SUSP (DIPHENHYD-LIDOCAINE-NYSTATIN) 15 ml swish and swallow three times a day  #450 oz x 1   Entered by:   Charolotte Capuchin, RN   Authorized by:   Rollene Rotunda, MD, [facility]   Signed by:   Charolotte Capuchin, RN on 11/02/2009   Method used:   Electronically to        [facility]* (retail)       [address]       [place], [place]  [address]       Ph: [phone]       Fax: [phone]   RxID:   [id number] POTASSIUM CHLORIDE CRYS CR 20 MEQ CR-TABS (POTASSIUM CHLORIDE CRYS CR) one daily  #30 x 1   Entered by:   Charolotte Capuchin, RN   Authorized by:   Rollene Rotunda, MD, [facility]   Signed by:   Charolotte Capuchin, RN on 11/02/2009   Method used:   Electronically to        [facility]* (retail)       [address]       [place], [place]  [address]       Ph: [phone]       Fax: [phone]  RxID:   [id number] FUROSEMIDE 20 MG TABS (FUROSEMIDE) one daily  #30 x 1   Entered by:   Charolotte Capuchin, RN   Authorized by:   Rollene Rotunda, MD, [facility]   Signed by:   Charolotte Capuchin, RN on 11/02/2009   Method used:   Electronically to        [facility]* (retail)       [address]       [place], [place]  [address]       Ph: [phone]       Fax: [phone]   RxID:   [id number]  I have reviewed and approved all prescriptions at the time of this visit. Rollene Rotunda, MD, [facility]  November 02, 2009 1:49 PM

## 2010-02-27 NOTE — Medication Information (Signed)
Summary: rov/sp   Anticoagulant Therapy  Managed by: Reina Fuse, PharmD Referring MD: Antoine Poche PCP: Shaune Leeks MD Supervising MD: Jens Som MD, Arlys John Indication 1: Aortic Valve Replacement Indication 2: Atrial Fibrillation Valve Type: St Judes -- Mechanical INR POC 3.3 INR RANGE 2.0-3.0  Dietary changes: no    Health status changes: no    Bleeding/hemorrhagic complications: no    Recent/future hospitalizations: yes       Details: Spent 5 days in the hospital last week following sycopal epidose.  Any changes in medication regimen? yes       Details: creon TID, losartan 100 mg daily  Recent/future dental: no  Any missed doses?: no       Is patient compliant with meds? yes      Comments: INR was below goal in the hospital and patient was discharged taking 3 mg daily.   Allergies: 1)  ! * Primaxin  Anticoagulation Management History:      The patient is taking warfarin and comes in today for a routine follow up visit.  Negative risk factors for bleeding include an age less than 82 years old.  The bleeding index is 'low risk'.  Positive CHADS2 values include History of HTN.  Negative CHADS2 values include Age > 29 years old.  His last INR was 7.1 ratio.  Anticoagulation responsible Kj Imbert: Jens Som MD, Arlys John.  INR POC: 3.3.  Cuvette Lot#: [id number].  Exp: 10/2010.    Anticoagulation Management Assessment/Plan:      The patient's current anticoagulation dose is Warfarin sodium 4 mg tabs: Take as directed by coumadin clinic..  The target INR is 2.0-3.0.  The next INR is due 10/03/2009.  Anticoagulation instructions were given to patient.  Results were reviewed/authorized by Reina Fuse, PharmD.  He was notified by Reina Fuse PharmD.         Prior Anticoagulation Instructions: INR 1.4  Take 1 tablet today, then change to 1/2 tablet daily except 1 tablet Tue and Sat.  Return to clinic in 10 days  Current Anticoagulation Instructions: INR 3.3  Hold Coumadin today,  Monday, August 22nd. Then, take Coumadin 0.5 tab (1 mg) on Sun, Mon, Wed, Fri and Coumadin 1 tab (2 mg) on Tues, Thur Sat. Return to clinic in 2 weeks.

## 2010-02-27 NOTE — Miscellaneous (Signed)
Summary: [facility] Orders   [facility] Orders   Imported By: Roderic Ovens 12/14/2009 17:43:27  _____________________________________________________________________  External Attachment:    Type:   Image     Comment:   External Document

## 2010-02-27 NOTE — Letter (Signed)
Summary: [facility] Office Visit   [facility] Office Visit   Imported By: Roderic Ovens 11/14/2009 11:15:55  _____________________________________________________________________  External Attachment:    Type:   Image     Comment:   External Document

## 2010-02-27 NOTE — Miscellaneous (Signed)
Summary: SLEEP MD REFERRAL  Clinical Lists Changes  Orders: Added new Referral order of Sleep Disorder Referral (Sleep Disorder) - Signed

## 2010-02-27 NOTE — Miscellaneous (Signed)
  Clinical Lists Changes  Observations: Added new observation of ECHOINTERP:    - Left ventricle: The cavity size was normal. Wall thickness was     increased in a pattern of mild LVH. Systolic function was     moderately reduced. The estimated ejection fraction was in the     range of 35% to 40%. There is hypokinesis of the anterolateral     myocardium. There is hypokinesis of the apical myocardium. There     is hypokinesis of the septal myocardium.   - Aortic valve: A mechanical prosthesis was present. Trivial     regurgitation.   - Mitral valve: Mild regurgitation.   - Pericardium, extracardiac: A moderate pericardial effusion was     identified anterior to the heart. There was moderate right atrial     chamber collapse.   Impressions:    - Technically difficult; LV function difficult to quantitate; LV     function appears to be moderately reduced; moderate anterior     pericardial effusion with RA collapse and respiratory flow     variation across the tricuspid valve; pericardial effusion appears     to be larger compared to study of 09/12/09. (10/26/2009 8:54) Added new observation of HOLTERFIND: NSR Sinus tachycardia SVT (10/03/2009 8:55)      Echocardiogram  Procedure date:  10/26/2009  Findings:         - Left ventricle: The cavity size was normal. Wall thickness was     increased in a pattern of mild LVH. Systolic function was     moderately reduced. The estimated ejection fraction was in the     range of 35% to 40%. There is hypokinesis of the anterolateral     myocardium. There is hypokinesis of the apical myocardium. There     is hypokinesis of the septal myocardium.   - Aortic valve: A mechanical prosthesis was present. Trivial     regurgitation.   - Mitral valve: Mild regurgitation.   - Pericardium, extracardiac: A moderate pericardial effusion was     identified anterior to the heart. There was moderate right atrial     chamber collapse.    Impressions:    - Technically difficult; LV function difficult to quantitate; LV     function appears to be moderately reduced; moderate anterior     pericardial effusion with RA collapse and respiratory flow     variation across the tricuspid valve; pericardial effusion appears     to be larger compared to study of 09/12/09.  Holter Monitor  Procedure date:  10/03/2009  Findings:      NSR Sinus tachycardia SVT

## 2010-02-27 NOTE — Assessment & Plan Note (Signed)
Summary: consult for osa   Visit Type:  Initial Consult Copy to:  Dr. Durene Fruits Primary Provider/Referring Provider:  Shaune Leeks MD  CC:  Sleep Consult, Pt states he is tired and week all the time, Pt states he gets up bout 3 times a night when sleeping, Pt states [facility], and pt states he does not use it due to pressure set to high which causes stomach to bloat up.  History of Present Illness: The pt is a 59y/o male who I have been asked to see for management of osa.  He underwent npsg in May of this year, and found to have moderate osa.  He had an AHI of 24/hr, with desat to 88%.  He was started on cpap by someone, but has no idea who it was and there is no record of anyone from this practice doing so.  He was started on the auto mode  with a full face mask, and has noted severe bloating related to air gulping.  Currently, he is not wearing cpap, and has been noted to have loud snoring and restless sleep per his wife.  He goes to bed btw 9-10pm, and arises at 7am to start his day. He feels fairly rested currently.  However, he tells me that he has lost over 50 pounds in the last 3 mos.  He denies any alertness issues during the day, and has no sleepiness while watching tv in the evening.  He has no issues with sleepiness while driving.    Preventive Screening-Counseling & Management  Alcohol-Tobacco     Smoking Status: never  Current Medications (verified): 1)  Coumadin 3 Mg Tabs (Warfarin Sodium) .... As Directed 2)  Carvedilol 3.125 Mg Tabs (Carvedilol) .... Take One Tablet By Mouth Twice A Day 3)  Ferrous Gluconate 324 Mg Tabs (Ferrous Gluconate) .... Take 1 Tablet By Mouth Two Times A Day 4)  Flonase 50 Mcg/act Susp (Fluticasone Propionate) .... 2 Sprays Each Nostril Daily 5)  Pantoprazole Sodium 40 Mg Tbec (Pantoprazole Sodium) .... Take 1 Tablet By Mouth Once A Day 6)  Amylas/lip/portease .... Three Times A Day Before Meals 7)   Cozaar 50 Mg Tabs (Losartan Potassium) .[name].. 1 Tab By Mouth Once Daily 8)  Oxycodone-Acetaminophen 5-500 Mg Caps (Oxycodone-Acetaminophen) .... As Directed  Allergies: 1)  ! * Primaxin 2)  ! Ace Inhibitors  Past History:  Past Medical History: Reviewed history from 09/19/2009 and no changes required. Aortic insufficiency with a bicuspid aortic valve (the last MRI demonstrated the aortic root to be 4.8 x 4.7 cm.  There was moderate regurgitation.  He has normal chamber size with very mild left ventricular dysfunction), gastroesophageal reflux disease, dyslipidemia duodenal ulcer with GI bleeding, hypertension,  obesity. 07/2009-[facility] for Pericardial effusion. Status post pericardial window Acute pancreatitis with pancreatic necrosis.08/2009- Admission for fever and syncope.  No source for fever seen on cultures.  Syncope thought to be related to orthostasis  Past Surgical History: Reviewed history from 08/18/2009 and no changes required. 1987                Ala Mem GI Bleed Gastritis ?Chem, Working for [employer] Dog in Throat  EGD to Remove 1999                Chicken in Throat  EGD to REmove 01/06/97  EGD (Dr. Kinnie Scales), impaction, dilatation, Schatzki's Ring 01/18/97          Echo LVH, mild dilated  aorta - root  mod to severe Aortic Regurge  EF 50-55% 08/09/98            EGD [name]) - repeat dilatation 01/10/01          EGD stricture/ H.H. duodenal ulcer/bleeding Marina Goodell) 12/25/01          EGD esophagus, stricture, dilated 03/23/01            EGD stricture/dilated 03/10/03            Cardiolite EKG (-)  ?small/apical infarct  EF 50% 2/05                 MRI  LVH  global hypokin  EF 48%  Mod A.I. 09/27/03            Echo - no changes, mod. severe aortic regurg. 07/09/04            Echo, mod. severe aortic regurg. 2-3+  T.R. , T.I.R., mild P.R. 03/08/05              EGD Schatzki's Ring  03/13/05 Schatzki's Ring - dilated, duodenitis 04/01/06              Echo Hypokin  Apex  EF 55% 06/11/08            EGD w/ Food Disimpaction  Food Impaction [facility]   Sm Bulb Ulcer (Dr Ewing Schlein)  06/2009             St Jude mechanical valve per Dr. Laneta Simmers  Family History: Reviewed history from 05/05/2008 and no changes required. Father: Dec. 79, CAD, S/P CABG, HTN, DM, valve pacer, natural causes Mother: Alive 9 obese,  Bedridden Siblings: 1 brother A&W CV: Father HBP: Father, mother DM:  Father ETOH:  brother  Social History: Reviewed history from 05/05/2008 and no changes required. Marital Status: Married, lives with wife Children: 2 grown  Occupation: IT trainer. Supervisor Never Smoked Alcohol use-no Drug use-no  Review of Systems       The patient complains of shortness of breath with activity, productive cough, irregular heartbeats, loss of appetite, weight change, nasal congestion/difficulty breathing through nose, and sneezing.  The patient denies shortness of breath at rest, non-productive cough, coughing up blood, chest pain, acid heartburn, indigestion, abdominal pain, difficulty swallowing, sore throat, tooth/dental problems, headaches, itching, ear ache, anxiety, depression, hand/feet swelling, joint stiffness or pain, rash, change in color of mucus, and fever.    Vital Signs:  Patient profile:   60 year old male Height:      71 inches Weight:      219.25 pounds O2 Sat:      99 % on Room air Temp:     99.8 degrees F oral Pulse rate:   94 / minute BP sitting:   120 / 78  (right arm) Cuff size:   large  Vitals Entered By: Carver Fila (October 05, 2009 11:26 AM)  O2 Flow:  Room air CC: Sleep Consult, Pt states he is tired and week all the time, Pt states he gets up bout 3 times a night when sleeping, Pt states he [facility], pt states he does not use it due to pressure set to high which causes stomach to bloat up Comments meds and allergies udpated Phone number updated [facility]  October 05, 2009  11:34 AM    Physical Exam  General:  ow male in nad Eyes:  PERRLA and EOMI.   Nose:  deviated septum to right with narrowing Mouth:  significant elongation of soft palate and uvula Neck:  no jvd, tmg, LN Lungs:  clear to auscultation Heart:  rrr, no rg.  prosthetic valve click Abdomen:  soft and nontender, bs+ Extremities:  mild LE edema, pulses intact distally no cyanosis  Neurologic:  alert and oriented, moves all 4.   Impression & Recommendations:  Problem # 1:  OBSTRUCTIVE SLEEP APNEA (ICD-327.23) the pt has a h/o moderate osa with poor cpap tolerance, but he has lost over 50 pounds since his initial sleep study!  It is unclear if he even still has sleep apnea.  He is sleeping well, and denies any concentration or sleepiness issues during periods of inactivity during the day or evening.  He does have a lot of medical issues which may be affected by sleep disordered breathing, therefore I would like to repeat his study off cpap to clarify the issue.  Will arrange for home study to be done, and he will f/u to discuss once results are available.    Medications Added to Medication List This Visit: 1)  Cozaar 50 Mg Tabs (Losartan potassium) .[name].. 1 tab by mouth once daily 2)  Oxycodone-acetaminophen 5-500 Mg Caps (Oxycodone-acetaminophen) .... As directed  Other Orders: Consultation Level IV (56213) Misc. Referral (Misc. Ref)  Patient Instructions: 1)  will check for persistent sleep apnea with home screening study since you have lost considerable weight. 2)  continue to work on weight loss 3)  I will contact you once data is available.

## 2010-02-27 NOTE — Progress Notes (Signed)
Summary: pt has medication questions   Phone Note Call from Patient Call back at Home Phone [phone]   Caller: Patient Reason for Call: Talk to Nurse, Talk to Doctor Summary of Call: pt not sure what meds he needs becasue Dr. Laneta Simmers said he may take him off some of his meds so he wants to talk to someone Initial call taken by: Omer Jack,  September 07, 2009 9:26 AM    Prescriptions: KLOR-CON M20 20 MEQ CR-TABS (POTASSIUM CHLORIDE CRYS CR) Take 1 tablet by mouth once a day  #30 x 6   Entered by:   Charolotte Capuchin, RN   Authorized by:   Rollene Rotunda, MD, [facility]   Signed by:   Charolotte Capuchin, RN on 09/07/2009   Method used:   Electronically to        [facility]* (retail)       [address]       [place], [place]  [address]       Ph: [phone]       Fax: [phone]   RxID:   [id number] PANTOPRAZOLE SODIUM 40 MG TBEC (PANTOPRAZOLE SODIUM) Take 1 tablet by mouth once a day  #30 x 6   Entered by:   Charolotte Capuchin, RN   Authorized by:   Rollene Rotunda, MD, [facility]   Signed by:   Charolotte Capuchin, RN on 09/07/2009   Method used:   Electronically to        [facility]* (retail)       [address]       [place], [place]  [address]       Ph: [phone]       Fax: [phone]   RxID:   [id number] FUROSEMIDE 40 MG TABS (FUROSEMIDE) Take 1 tablet by mouth once a day  #30 x 6   Entered by:   Charolotte Capuchin, RN   Authorized by:   Rollene Rotunda, MD, [facility]   Signed by:   Charolotte Capuchin, RN on 09/07/2009   Method used:   Electronically to        [facility]* (retail)       [address]       [place], [place]  [address]       Ph: [phone]       Fax: [phone]   RxID:   [id number] FERROUS GLUCONATE 324 MG TABS (FERROUS GLUCONATE) Take 1 tablet by mouth two times a day  #60 x 3   Entered by:   Charolotte Capuchin, RN   Authorized by:   Rollene Rotunda, MD, [facility]   Signed by:   Charolotte Capuchin, RN on  09/07/2009   Method used:   Electronically to        [facility]* (retail)       [address]       [place], [place]  [address]       Ph: [phone]       Fax: [phone]   RxID:   [id number] CARVEDILOL 3.125 MG TABS (CARVEDILOL) Take one tablet by mouth twice a day  #60 x 6   Entered by:   Charolotte Capuchin, RN   Authorized by:   Rollene Rotunda, MD, [facility]   Signed by:   Charolotte Capuchin, RN on 09/07/2009   Method used:   Electronically to        [facility]* (retail)       [address]       [place], [place]  [address]  Ph: [phone]       Fax: [phone]   RxID:   [id number]

## 2010-02-27 NOTE — Medication Information (Signed)
Summary: rov/sp  Anticoagulant Therapy  Managed by: Bethena Midget, RN, BSN Referring MD: Antoine Poche PCP: Shaune Leeks MD Supervising MD: Mariah Milling Indication 1: Aortic Valve Replacement Indication 2: Atrial Fibrillation Valve Type: St Judes -- Mechanical INR POC 2.2 INR RANGE 2.0-3.0  Dietary changes: no    Health status changes: no    Bleeding/hemorrhagic complications: no    Recent/future hospitalizations: no    Any changes in medication regimen? no    Recent/future dental: no  Any missed doses?: no       Is patient compliant with meds? yes       Allergies: 1)  ! * Primaxin 2)  ! Ace Inhibitors  Anticoagulation Management History:      The patient is taking warfarin and comes in today for a routine follow up visit.  Positive risk factors for bleeding include presence of serious comorbidities.  Negative risk factors for bleeding include an age less than 20 years old.  The bleeding index is 'intermediate risk'.  Positive CHADS2 values include History of HTN.  Negative CHADS2 values include Age > 50 years old.  His last INR was 6.6 ratio.  Anticoagulation responsible provider: [facility]adelal[facility].  INR POC: 2.2.  Cuvette Lot#: [id number].  Exp: 12/2010.    Anticoagulation Management Assessment/Plan:      The patient's current anticoagulation dose is Coumadin 2 mg tabs: as directed.  The target INR is 2.0-3.0.  The next INR is due 01/17/2010.  Anticoagulation instructions were given to patient.  Results were reviewed/authorized by Bethena Midget, RN, BSN.  He was notified by Bethena Midget, RN, BSN.         Prior Anticoagulation Instructions: INR 1.7  Take 1 1/2 tablets today then resume same dose of 1/2 tablet every day except 1 tablet on Tuesday, Thursday and Saturday.  Recheck INR in 2 weeks.   Current Anticoagulation Instructions: INR 2.2 Continue 1/2 pill everyday except 1 pill on Tuesdays, Thursdays and Saturdays. Recheck in 3 weeks.

## 2010-02-27 NOTE — Letter (Signed)
Summary: [facility]   [facility]   Imported By: Roderic Ovens 01/01/2010 11:41:06  _____________________________________________________________________  External Attachment:    Type:   Image     Comment:   External Document

## 2010-02-27 NOTE — Letter (Signed)
Summary: [facility]   [facility]   Imported By: Roderic Ovens 08/12/2009 11:27:12  _____________________________________________________________________  External Attachment:    Type:   Image     Comment:   External Document

## 2010-02-27 NOTE — Letter (Signed)
Summary: [facility]  [facility]   Imported By: Marylou Mccoy 11/13/2009 11:55:45  _____________________________________________________________________  External Attachment:    Type:   Image     Comment:   External Document

## 2010-03-01 NOTE — Assessment & Plan Note (Signed)
Summary: CPX  CYD   Vital Signs:  Patient profile:   60 year old male Height:      71 inches Weight:      190.50 pounds BMI:     26.67 Temp:     98.7 degrees F oral Pulse rate:   88 / minute Pulse rhythm:   regular BP sitting:   130 / 70  (left arm) Cuff size:   large  Vitals Entered By: Delilah Shan CMA Walker Paddack Dull) (February 13, 2010 11:48 AM) CC: CPX   History of Present Illness: Pt was scheduled for CPE.  this was tabled in light of other needs/chronic conditions.    "I'm weak and tired."  He has been coughing since the surgery, dry w/o sputum.  Lack of energy noted.  Dec in muscle mass.  Labs reviewed with patient.  He can eat with the creon, but still not able to take a moderate to large meal.    "I've go these places on my chest." See exam.   Has cards follow up Thursday.  Not short of breath, but aggravated by cough.    He is currently off oxycodone.    Current Medications (verified): 1)  Coreg 6.25 Mg Tabs (Carvedilol) .[name].. 1 By Mouth Two Times A Day 2)  Ferrous Gluconate 324 Mg Tabs (Ferrous Gluconate) .... Take 1 Tablet By Mouth Two Times A Day 3)  Flonase 50 Mcg/act Susp (Fluticasone Propionate) .... 2 Sprays Each Nostril Daily 4)  Pantoprazole Sodium 40 Mg Tbec (Pantoprazole Sodium) .... Take 1 Tablet By Mouth Once A Day 5)  Creon 6000 Unit Cpep (Pancrelipase (Lip-Prot-Amyl)) .... Take One By Mouth Three Times A Day 6)  Coumadin 2 Mg Tabs (Warfarin Sodium) .... As Directed 7)  Ventolin Hfa 108 (90 Base) Mcg/act Aers (Albuterol Sulfate) .... As Directed 8)  Alprazolam 0.5 Mg Tabs (Alprazolam) .... As Needed 9)  Furosemide 20 Mg Tabs (Furosemide) .... As Needed For Weight Gain 10)  Potassium Chloride Crys Cr 20 Meq Cr-Tabs (Potassium Chloride Crys Cr) .... One Daily 11)  Warfarin Sodium 1 Mg Tabs (Warfarin Sodium) .... Use As Directed By Anticoagualtion Clinic 12)  Delsym 30 Mg/46ml Lqcr (Dextromethorphan Polistirex) .... As Needed  Allergies: 1)  ! * Primaxin 2)  !  Ace Inhibitors  Past History:  Past Medical History: Last updated: 10/30/2009 Aortic insufficiency with a bicuspid aortic valve (the last MRI demonstrated the aortic root to be 4.8 x 4.7 cm.  There was moderate regurgitation.  He has normal chamber size with very mild left ventricular dysfunction) gastroesophageal reflux disease dyslipidemia  duodenal ulcer with GI bleeding hypertension,  obesity.  07/2009-[facility] for Pericardial effusion. Status post pericardial window  Acute pancreatitis with pancreatic necrosis. 08/2009- Admission for fever and syncope.  No source for fever seen on cultures.  Syncope thought to be related to orthostasis 09/2009 admission to [facility] with necrotizing pancreatitis and pseudocyst formation, also with ATN 10/2009 admission to [facility] with pneumonia and right pleural effusion status post thoracentesis  Past Surgical History: Last updated: 08/18/2009 1987                Ala Mem GI Bleed Gastritis ?Chem, Working for [employer] Dog in Throat  EGD to Remove 1999                Chicken in Throat  EGD to REmove 01/06/97  EGD (Dr. Kinnie Scales), impaction, dilatation, Schatzki's Ring 01/18/97          Echo LVH, mild dilated  aorta - root  mod to severe Aortic Regurge  EF 50-55% 08/09/98            EGD [name]) - repeat dilatation 01/10/01          EGD stricture/ H.H. duodenal ulcer/bleeding Marina Goodell) 12/25/01          EGD esophagus, stricture, dilated 03/23/01            EGD stricture/dilated 03/10/03            Cardiolite EKG (-)  ?small/apical infarct  EF 50% 2/05                 MRI  LVH  global hypokin  EF 48%  Mod A.I. 09/27/03            Echo - no changes, mod. severe aortic regurg. 07/09/04            Echo, mod. severe aortic regurg. 2-3+  T.R. , T.I.R., mild P.R. 03/08/05              EGD Schatzki's Ring  03/13/05 Schatzki's Ring - dilated, duodenitis 04/01/06              Echo Hypokin Apex  EF 55% 06/11/08            EGD w/ Food Disimpaction  Food  Impaction [facility]   Sm Bulb Ulcer (Dr Ewing Schlein)  06/2009             St Jude mechanical valve per Dr. 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  Otherwise negative.    Physical Exam  General:  no apparent distress mucous membranes moist, no thrush or acute changes RRR with valvular click heard clear to auscultation bilaterally  abdominal exam: soft, not tender to palpation normal bs ext with trace edema he has some retained suture on the anterior chest wall.  It is barely visible and doesn't appear to be infected.    Impression & Recommendations:  Problem # 1:  CYST AND PSEUDOCYST OF PANCREAS (ICD-577.2) >25 min spent with patient, at least half of which was spent on counseling I would gradually increase the creon and see if this affects his intake and energy level/muscle mass.  His calcium corrects and I think this other labs may normalize if his intake was improved.  He'll call back with update.  Will try 2 creon in AM, 1 at lunch and 1 at dinner for now.    Problem # 2:  FATIGUE (ICD-780.79) see above.   Complete Medication List: 1)  Coreg 6.25 Mg Tabs (Carvedilol) .[name].. 1 by mouth two times a day 2)  Ferrous Gluconate 324 Mg Tabs (Ferrous gluconate) .... Take 1 tablet by mouth two times a day 3)  Flonase 50 Mcg/act Susp (Fluticasone propionate) .... 2 sprays each nostril daily 4)  Pantoprazole Sodium 40 Mg Tbec (Pantoprazole sodium) .... Take 1 tablet by mouth once a day 5)  Creon 6000 Unit Cpep (Pancrelipase (lip-prot-amyl)) .... Take one by mouth three times a day 6)  Coumadin 2 Mg Tabs (Warfarin sodium) .... As directed 7)  Ventolin Hfa 108 (90 Base) Mcg/act Aers (Albuterol sulfate) .... As directed 8)  Alprazolam 0.5 Mg Tabs (Alprazolam) .... As needed 9)  Furosemide 20 Mg Tabs (Furosemide) ....  As needed for weight gain 10)  Potassium Chloride Crys Cr 20 Meq Cr-tabs (Potassium chloride crys cr) .... One daily 11)  Warfarin Sodium 1 Mg Tabs (Warfarin sodium) .... Use as directed by anticoagualtion clinic 12)  Delsym 30 Mg/28ml Lqcr (Dextromethorphan polistirex) .... As needed  Patient Instructions: 1)  I would increase the creon to 2 with breakfast, 1 at lunch, and 1 at dinner.  If you don't tolerate this, go back to 1 three times a day and let me know.  Otherwise call me with an update next week. 2)  Please talk to cardiology about the cough and the palpitations. 3)  It looks like you have some suture material causing the symptoms on you chest wall.  I wouldn't do anything about these now.  If the area is redder, let a doctor know.  I would ask cardiology for their input on Thursday.  Take care.  We can decide about follow up after you call back.    Orders Added: 1)  Est. Patient Level IV [81191]    Current Allergies (reviewed today): ! * PRIMAXIN ! ACE INHIBITORS

## 2010-03-01 NOTE — Letter (Signed)
Summary: [facility] Office Visit Note   [facility] Office Visit Note   Imported By: Roderic Ovens 01/26/2010 11:59:10  _____________________________________________________________________  External Attachment:    Type:   Image     Comment:   External Document

## 2010-03-01 NOTE — Medication Information (Signed)
Summary: rov/ewj  Anticoagulant Therapy  Managed by: Bethena Midget, RN, BSN Referring MD: Antoine Poche PCP: Shaune Leeks MD Supervising MD: Mariah Milling Indication 1: Aortic Valve Replacement Indication 2: Atrial Fibrillation Valve Type: St Judes -- Mechanical INR POC 2.5 INR RANGE 2.0-3.0  Dietary changes: no    Health status changes: no    Bleeding/hemorrhagic complications: no    Recent/future hospitalizations: no    Any changes in medication regimen? no    Recent/future dental: no  Any missed doses?: no       Is patient compliant with meds? yes       Allergies: 1)  ! * Primaxin 2)  ! Ace Inhibitors  Anticoagulation Management History:      The patient is taking warfarin and comes in today for a routine follow up visit.  Positive risk factors for bleeding include presence of serious comorbidities.  Negative risk factors for bleeding include an age less than 51 years old.  The bleeding index is 'intermediate risk'.  Positive CHADS2 values include History of HTN.  Negative CHADS2 values include Age > 12 years old.  His last INR was 6.6 ratio.  Anticoagulation responsible provider: Gollan.  INR POC: 2.5.  Cuvette Lot#: [id number].  Exp: 03/2011.    Anticoagulation Management Assessment/Plan:      The patient's current anticoagulation dose is Coumadin 2 mg tabs: as directed, Warfarin sodium 1 mg tabs: Use as directed by Anticoagualtion Clinic.  The target INR is 2.0-3.0.  The next INR is due 03/14/2010.  Anticoagulation instructions were given to patient.  Results were reviewed/authorized by Bethena Midget, RN, BSN.  He was notified by Bethena Midget, RN, BSN.         Prior Anticoagulation Instructions: INR 2.0  Take 1 tablet today, then resume 1/2 tablet daily except 1 tablet on Tuesdays, Thursdays, and Saturdays.  Recheck in 4 weeks.   Current Anticoagulation Instructions: INR 2.5  continue 1mg  everyday except 2mg  on Tuesdays, Thursdays and Saturdays. Recheck in 4 weeks.

## 2010-03-01 NOTE — Miscellaneous (Signed)
Summary: [facility] Orders   [facility] Orders   Imported By: Roderic Ovens 01/16/2010 11:39:32  _____________________________________________________________________  External Attachment:    Type:   Image     Comment:   External Document

## 2010-03-01 NOTE — Assessment & Plan Note (Signed)
Summary: f/u echo at 9:30/sl      Allergies Added:   Visit Type:  Follow-up Referring Provider:  Dr. Durene Fruits Primary Provider:  Dr Para March  CC:  AVR.  History of Present Illness: The pateint presents for followup of his aortic valve replacement. Since I last saw him he has had continued fatigue. He continues to have a dry hacking cough nonproductive. He has some discomfort at the lower end of his sternal incision where he now has 2 sutures visible. He's had no fevers or chills or drainage from these. There is no erythema. He has no new chest discomfort, neck or arm discomfort. He has no new shortness of breath, PND or orthopnea. He said no new weight loss though he has not regained weight that he had lost previously. He apparently is still being followed for large pancreatic cyst.  Current Medications (verified): 1)  Coreg 6.25 Mg Tabs (Carvedilol) .[name].. 1 By Mouth Two Times A Day 2)  Ferrous Gluconate 324 Mg Tabs (Ferrous Gluconate) .... Take 1 Tablet By Mouth Two Times A Day 3)  Flonase 50 Mcg/act Susp (Fluticasone Propionate) .... 2 Sprays Each Nostril Daily 4)  Pantoprazole Sodium 40 Mg Tbec (Pantoprazole Sodium) .... Take 1 Tablet By Mouth Once A Day 5)  Creon 6000 Unit Cpep (Pancrelipase (Lip-Prot-Amyl)) .... Take One By Mouth Three Times A Day 6)  Coumadin 2 Mg Tabs (Warfarin Sodium) .... As Directed 7)  Ventolin Hfa 108 (90 Base) Mcg/act Aers (Albuterol Sulfate) .... As Directed 8)  Alprazolam 0.5 Mg Tabs (Alprazolam) .... As Needed 9)  Potassium Chloride Crys Cr 20 Meq Cr-Tabs (Potassium Chloride Crys Cr) .... One Daily 10)  Warfarin Sodium 1 Mg Tabs (Warfarin Sodium) .... Use As Directed By Anticoagualtion Clinic 11)  Delsym 30 Mg/50ml Lqcr (Dextromethorphan Polistirex) .... As Needed  Allergies (verified): 1)  ! * Primaxin 2)  ! Ace Inhibitors  Past History:  Past Medical History: Aortic insufficiency with a bicuspid aortic valve (the last MRI demonstrated the aortic root  to be 4.8 x 4.7 cm.  There was moderate regurgitation.  He has normal chamber size with very mild left ventricular dysfunction) gastroesophageal reflux disease dyslipidemia  duodenal ulcer with GI bleeding hypertension,  07/2009-Admisson for Pericardial effusion. Status post pericardial window  Acute pancreatitis with pancreatic necrosis. 08/2009- Admission for fever and syncope.  No source for fever seen on cultures.  Syncope thought to be related to orthostasis 09/2009 admission to [facility] with necrotizing pancreatitis and pseudocyst formation, also with ATN 10/2009 admission to [facility] with pneumonia and right pleural effusion status post thoracentesis  Past Surgical History: Reviewed history from 08/18/2009 and no changes required. 1987                Ala Mem GI Bleed Gastritis ?Chem, Working for [employer] Dog in Throat  EGD to Remove 1999                Chicken in Throat  EGD to REmove 01/06/97          EGD (Dr. Kinnie Scales), impaction, dilatation, Schatzki's Ring 01/18/97          Echo LVH, mild dilated  aorta - root  mod to severe Aortic Regurge  EF 50-55% 08/09/98            EGD [name]) - repeat dilatation 01/10/01          EGD stricture/  H.H. duodenal ulcer/bleeding Marina Goodell) 12/25/01          EGD esophagus, stricture, dilated 03/23/01            EGD stricture/dilated 03/10/03            Cardiolite EKG (-)  ?small/apical infarct  EF 50% 2/05                 MRI  LVH  global hypokin  EF 48%  Mod A.I. 09/27/03            Echo - no changes, mod. severe aortic regurg. 07/09/04            Echo, mod. severe aortic regurg. 2-3+  T.R. , T.I.R., mild P.R. 03/08/05              EGD Schatzki's Ring  03/13/05 Schatzki's Ring - dilated, duodenitis 04/01/06              Echo Hypokin Apex  EF 55% 06/11/08            EGD w/ Food Disimpaction  Food Impaction [facility]   Sm Bulb Ulcer (Dr Ewing Schlein)  06/2009             St Jude mechanical valve per Dr. Laneta Simmers  Review of Systems       As stated in the HPI  and negative for all other systems.   Vital Signs:  Patient profile:   60 year old male Height:      71 inches Weight:      189 pounds Pulse rate:   77 / minute BP sitting:   119 / 74  (left arm)  Vitals Entered By: Burnett Kanaris, CNA (February 15, 2010 4:46 PM)  Physical Exam  General:  No acute distress but he is gaunt and weak looking Head:  normocephalic and atraumatic Eyes:  PERRLA/EOM intact; conjunctiva and lids normal. Neck:  Neck supple, no JVD. No masses, thyromegaly or abnormal cervical nodes. Chest Wall:  His sternum is well healed however, there are 2 small sutures protruding from the cephalad portion of the incision Lungs:  Clear bilaterally to auscultation and percussion. Abdomen:  Bowel sounds positive; abdomen soft and non-tender without masses, organomegaly, or hernias noted. No hepatosplenomegaly. Msk:  Diffuse muscle wasting Extremities:  No clubbing or cyanosis. Neurologic:  Alert and oriented x 3. Skin:  Intact without lesions or rashes. Cervical Nodes:  no significant adenopathy Axillary Nodes:  no significant adenopathy Inguinal Nodes:  no significant adenopathy Psych:  Normal affect.   Detailed Cardiovascular Exam  Neck    Carotids: Carotids full and equal bilaterally without bruits.      Neck Veins: Normal, no JVD.    Heart    Inspection: no deformities or lifts noted.      Palpation: normal PMI with no thrills palpable.      Auscultation: Normal S1, mechanical S2, 2/6 apical systolic murmur, no rubs  Vascular    Abdominal Aorta: no palpable masses, pulsations, or audible bruits.      Femoral Pulses: normal femoral pulses bilaterally.      Pedal Pulses: normal pedal pulses bilaterally.      Radial Pulses: normal radial pulses bilaterally.      Peripheral Circulation: no clubbing, cyanosis.  Mild edema.   EKG  Procedure date:  02/15/2010  Findings:      Sinus rhythm, rate 77, axis within normal limits, intervals within normal limits, no  acute ST-T wave changes  Impression &  Recommendations:  Problem # 1:  COUGH (ICD-786.2) This is a continued complaint. It wakes him up. His last chest x-ray was done at the cardiothoracic surgery office and there were apparently no acute findings. I don't see a medication that is the offending agent. Unless our pulmonologists who have seen him in the past to see him again and comment on this. Orders: Pulmonary Referral (Pulmonary)  Problem # 2:  AORTIC VALVE REPLACEMENT, HX OF (ICD-V43.3) He has 2 small sutures protruding.  I will send him back to the surgeons for a PA visit.  I will send him to cardiac rehab.   Orders: Cardiac Rehabilitation (Cardiac Rehab) TCTS Referral (TCTS Ref)  Problem # 3:  PERICARDIAL EFFUSION (ICD-423.9) He had an echo today and I will follow up these results.  Problem # 4:  ATRIAL FIBRILLATION (ICD-427.31) He remains on coumadin and has had no symptomatic recurrence. Orders: EKG w/ Interpretation (93000)  Patient Instructions: 1)  Your physician recommends that you schedule a follow-up appointment in: 4 months with Dr Antoine Poche 2)  Your physician recommends that you continue on your current medications as directed. Please refer to the Current Medication list given to you today. 3)  Your physician recommends referral and attendance at a Cardiac Rehab Program.  You will be contacted about this 4)  You have been referred to pulmonary for cough and to Dr Sharee Pimple office to see the PA to remove sutures.  You will be called with these appointments, if you do not hear from anyone please call me.

## 2010-03-01 NOTE — Miscellaneous (Signed)
Summary: Appointment Canceled  Appointment status changed to canceled by [employer] on 01/08/2010 12:27 PM.  Cancellation Comments --------------------- echo dx 423.9/bcbs/medcost/sl  Appointment Information ----------------------- Appt Type:  CARDIOLOGY ANCILLARY VISIT      Date:  Friday, February 16, 2010      Time:  9:30 AM for 60 min   Urgency:  Routine   Made By:  Pearson Grippe  To Visit:  [id number]    Reason:  echo dx 423.9/bcbs/medcost/sl  Appt Comments ------------- -- 01/08/10 12:27: ([facility]) CANCELED -- echo dx 423.9/bcbs/medcost/sl -- 12/12/09 11:18: ([facility]) BOOKED -- Routine CARDIOLOGY ANCILLARY VISIT at 02/16/2010 9:30 AM for 60 min echo dx 423.9/bcbs/medcost/sl

## 2010-03-01 NOTE — Progress Notes (Signed)
----   Converted from flag ---- ---- 02/05/2010 1:42 PM, Crawford Givens MD wrote: 427.31 cmet/lipid/tsh/cbc  ---- 02/05/2010 11:37 AM, Liane Comber CMA (AAMA) wrote: Lab orders please! Good Morning! This pt is scheduled for cpx labs [place], which labs to draw and dx codes to use? Thanks Tasha ------------------------------

## 2010-03-01 NOTE — Medication Information (Signed)
Summary: rov/tm  Anticoagulant Therapy  Managed by: Cloyde Reams, RN, BSN Referring MD: Antoine Poche PCP: Shaune Leeks MD Supervising MD: Mariah Milling Indication 1: Aortic Valve Replacement Indication 2: Atrial Fibrillation Valve Type: St Judes -- Mechanical INR POC 2.0 INR RANGE 2.0-3.0  Dietary changes: no    Health status changes: no    Bleeding/hemorrhagic complications: no    Recent/future hospitalizations: no    Any changes in medication regimen? no    Recent/future dental: no  Any missed doses?: no       Is patient compliant with meds? yes       Allergies: 1)  ! * Primaxin 2)  ! Ace Inhibitors  Anticoagulation Management History:      The patient is taking warfarin and comes in today for a routine follow up visit.  Positive risk factors for bleeding include presence of serious comorbidities.  Negative risk factors for bleeding include an age less than 66 years old.  The bleeding index is 'intermediate risk'.  Positive CHADS2 values include History of HTN.  Negative CHADS2 values include Age > 37 years old.  His last INR was 6.6 ratio.  Anticoagulation responsible provider: Delayza Lungren.  INR POC: 2.0.  Cuvette Lot#: [id number].  Exp: 12/2010.    Anticoagulation Management Assessment/Plan:      The patient's current anticoagulation dose is Coumadin 2 mg tabs: as directed, Warfarin sodium 1 mg tabs: Use as directed by Anticoagualtion Clinic.  The target INR is 2.0-3.0.  The next INR is due 02/14/2010.  Anticoagulation instructions were given to patient.  Results were reviewed/authorized by Cloyde Reams, RN, BSN.  He was notified by Cloyde Reams RN.         Prior Anticoagulation Instructions: INR 2.2 Continue 1/2 pill everyday except 1 pill on Tuesdays, Thursdays and Saturdays. Recheck in 3 weeks.   Current Anticoagulation Instructions: INR 2.0  Take 1 tablet today, then resume 1/2 tablet daily except 1 tablet on Tuesdays, Thursdays, and Saturdays.  Recheck in 4 weeks.

## 2010-03-07 ENCOUNTER — Encounter: Payer: Self-pay | Admitting: Internal Medicine

## 2010-03-07 ENCOUNTER — Institutional Professional Consult (permissible substitution) (INDEPENDENT_AMBULATORY_CARE_PROVIDER_SITE_OTHER): Payer: BC Managed Care – PPO | Admitting: Internal Medicine

## 2010-03-07 DIAGNOSIS — K219 Gastro-esophageal reflux disease without esophagitis: Secondary | ICD-10-CM

## 2010-03-07 DIAGNOSIS — R05 Cough: Secondary | ICD-10-CM

## 2010-03-07 NOTE — Progress Notes (Signed)
Summary: pt's appetite is not any better  Phone Note Call from Patient Call back at Home Phone [phone]   Caller: Spouse  [name]  Summary of Call: Pt's creon dose was increased to see if that would help pt's appetite and it has not.  Wife said pt was told to call and let you know whether or not it helped. Initial call taken by: Lowella Petties CMA, AAMA,  February 23, 2010 10:17 AM  Follow-up for Phone Call        If it didn't help, but patient tolerated the increase otherwise, then take 2 in AM, 2 at lunch, and 1 at supper.  If he tolerates this, increase to 2 three times a day.  Let me know if that helps any.  Thanks.  Follow-up by: Crawford Givens MD,  February 23, 2010 1:54 PM  Additional Follow-up for Phone Call Additional follow up Details #1::        Left message on voicemail  in detail.  Personalized VM and also asked  to return call to verify understanding the instructions.  Lugene Fuquay CMA Ariea Rochin Dull)  February 23, 2010 2:58 PM    Left message with pt. to have wife call.  Lugene Fuquay CMA (AAMA)  February 26, 2010 4:12 PM   Patient called back and stated that they did receive the detailed message and have increased the medication accordingly.  Lugene Fuquay CMA Cayli Escajeda Dull)  February 26, 2010 4:34 PM

## 2010-03-12 ENCOUNTER — Encounter: Payer: Self-pay | Admitting: Family Medicine

## 2010-03-14 ENCOUNTER — Encounter (INDEPENDENT_AMBULATORY_CARE_PROVIDER_SITE_OTHER): Payer: BC Managed Care – PPO

## 2010-03-14 ENCOUNTER — Encounter: Payer: Self-pay | Admitting: Cardiovascular Disease

## 2010-03-14 DIAGNOSIS — Z7901 Long term (current) use of anticoagulants: Secondary | ICD-10-CM

## 2010-03-14 DIAGNOSIS — I4891 Unspecified atrial fibrillation: Secondary | ICD-10-CM

## 2010-03-15 NOTE — Assessment & Plan Note (Signed)
Summary: Pulmonary/ new pt eval for cough   Visit Type:  Initial Consult Copy to:  Dr. Durene Fruits Primary Provider/Referring Provider:  Dr Para March  CC:  Cough.  History of Present Illness: 60 yowm never smoker with chronic cough ever since AVR June 2011 at [facility]  March 07, 2010  1st pulmonary office eval cc daily mostly dry hacky  cough waxes and wanes more in afternoon and evening only better after cough syrup assoc with new sensation of pnds x a year so with sensation of choking gagging and h/o dyspnagia with choking on food taking ppi one daily p breakfast since last GI eval ? name of doctor.  Pt denies any significant sore throat, dysphagia, itching, sneezing,    excess or purulent nasal secretions,  fever, chills, sweats, unintended wt loss, pleuritic or exertional cp, hempoptysis, change in activity tolerance  orthopnea pnd or leg swelling.  Pt also denies any obvious fluctuation in symptoms with weather or environmental change or other alleviating or aggravating factors.       Current Medications (verified): 1)  Coreg 6.25 Mg Tabs (Carvedilol) .[name].. 1 By Mouth Two Times A Day 2)  Ferrous Gluconate 324 Mg Tabs (Ferrous Gluconate) .... Take 1 Tablet By Mouth Two Times A Day 3)  Flonase 50 Mcg/act Susp (Fluticasone Propionate) .... 2 Sprays Each Nostril Daily 4)  Pantoprazole Sodium 40 Mg Tbec (Pantoprazole Sodium) .... Take 1 Tablet By Mouth Once A Day 5)  Creon 6000 Unit Cpep (Pancrelipase (Lip-Prot-Amyl)) .... 2 Every Am, 1 At [facility], and 1 Every Evening 6)  Warfarin Sodium 1 Mg Tabs (Warfarin Sodium) .... As Directed Per Clinic 7)  Ventolin Hfa 108 (90 Base) Mcg/act Aers (Albuterol Sulfate) .... As Directed 8)  Alprazolam 0.5 Mg Tabs (Alprazolam) .[name].. 1 At Bedtime As Needed 9)  Potassium Chloride Crys Cr 20 Meq Cr-Tabs (Potassium Chloride Crys Cr) .... One Daily 10)  Warfarin Sodium 1 Mg Tabs (Warfarin Sodium) .... Use As Directed By Anticoagualtion Clinic 11)  Delsym 30 Mg/54ml Lqcr  (Dextromethorphan Polistirex) .... As Needed 12)  Furosemide 20 Mg Tabs (Furosemide) .[name].. 1 By Mouth Once Daily As Needed 13)  Colace 100 Mg Caps (Docusate Sodium) .... As Directed Per Bottle As Needed  Allergies (verified): 1)  ! * Primaxin 2)  ! Ace Inhibitors  Past History:  Past Medical History: Aortic insufficiency with a bicuspid aortic valve (the last MRI demonstrated the aortic root to be 4.8 x 4.7 cm.  There was moderate regurgitation.  He has normal chamber size with very mild left ventricular dysfunction) gastroesophageal reflux disease      -  dyslipidemia  duodenal ulcer with GI bleeding hypertension,  07/2009-Admisson for Pericardial effusion. Status post pericardial window  Acute pancreatitis with pancreatic necrosis. 08/2009- Admission for fever and syncope.  No source for fever seen on cultures.  Syncope thought to be related to orthostasis 09/2009 admission to [facility] with necrotizing pancreatitis and pseudocyst formation, also with ATN 10/2009 admission to [facility] with pneumonia and right pleural effusion status post thoracentesis Cough onset June 2011 GERD with stricture       - EGD with dilation  03/23/2001 ..........................Marland KitchenArlyce Dice  Family History: Father: Dec. 79, CAD, S/P CABG, HTN, DM, valve pacer, natural causes Mother: Alive 19 obese,  Bedridden Siblings: 1 brother A&W CV: Father HBP: Father, mother DM:  Father ETOH:  brother Lymphoma- Father  Review of Systems       The patient complains of shortness of breath with activity, non-productive cough,  irregular heartbeats, loss of appetite, weight change, nasal congestion/difficulty breathing through nose, and itching.  The patient denies shortness of breath at rest, coughing up blood, chest pain, acid heartburn, indigestion, abdominal pain, difficulty swallowing, sore throat, tooth/dental problems, headaches, sneezing, ear ache, anxiety, depression, hand/feet swelling, joint stiffness or pain, rash, change  in color of mucus, and fever.    Vital Signs:  Patient profile:   60 year old male Weight:      193 pounds O2 Sat:      96 % on Room air Temp:     97.9 degrees F oral Pulse rate:   80 / minute BP sitting:   110 / 76  (left arm)  Vitals Entered By: Vernie Murders (March 07, 2010 1:47 PM)  O2 Flow:  Room air  Physical Exam  Additional Exam:  wt 189 > 193 March 07, 2010 HEENT: nl dentition, turbinates, and orophanx. Nl external ear canals without cough reflex NECK :  without JVD/Nodes/TM/ nl carotid upstrokes bilaterally LUNGS: no acc muscle use, clear to A and P bilaterally without cough on insp or exp maneuvers CV:  RRR ,  mechanical S2 ,  no s3 or murmur or increase in P2, no edema  ABD:  soft and nontender with nl excursion in the supine position. No bruits or organomegaly, bowel sounds nl MS:  warm without deformities, calf tenderness, cyanosis or clubbing SKIN: warm and dry without lesions   NEURO:  alert, approp, no deficits     CXR  Procedure date:  12/05/2009  Findings:      improved bibasilar atx, small residual R effusion  Impression & Recommendations:  Problem # 1:  COUGH (ICD-786.2)  The most common causes of chronic cough in immunocompetent adults include: upper airway cough syndrome (UACS), previously referred to as postnasal drip syndrome,  caused by variety of rhinosinus conditions; (2) asthma; (3) GERD; (4) chronic bronchitis from cigarette smoking or other inhaled environmental irritants; (5) nonasthmatic eosinophilic bronchitis; and (6) bronchiectasis. These conditions, singly or in combination, have accounted for up to 94% of the causes of chronic cough in prospective studies.  Of the three most common causes of chronic cough, only one( GERD) can actually cause the other two (asthma and pnds) and perpetuate the cylce of cough inducing airway trauma, inflammation, heightened sensitivity to reflux which is prompted by the cough itself via a cyclical  mechanism.  This may partially respond to steroids and look like asthma and post nasal drainage but never erradicated completely unless the cough and the secondary reflux are eliminated, preferably both at the same time.    The standardized cough guidelines recently published in Chest are a 14 step process, not a single office visit,  and are intended  to address this problem logically,  with an alogrithm dependent on response to each progressive step  to determine a specific diagnosis with  minimal addtional testing needed. Therefore if compliance is an issue this empiric standardized approach simply won't work.  See instructions for specific recommendations   Orders: Consultation Level V (82956)  Problem # 2:  GERD (ICD-530.81)  His updated medication list for this problem includes:    Pantoprazole Sodium 40 Mg Tbec (Pantoprazole sodium) .[name]... Take one 30-60 min before first and last meals of the day  Orders: Consultation Level V (21308)  Medications Added to Medication List This Visit: 1)  Pantoprazole Sodium 40 Mg Tbec (Pantoprazole sodium) .... Take one 30-60 min before first and last meals of  the day 2)  Creon 6000 Unit Cpep (Pancrelipase (lip-prot-amyl)) .... 2 every am, 1 at noon, and 1 every evening 3)  Warfarin Sodium 1 Mg Tabs (Warfarin sodium) .... As directed per clinic 4)  Alprazolam 0.5 Mg Tabs (Alprazolam) .[name].. 1 at bedtime as needed 5)  Furosemide 20 Mg Tabs (Furosemide) .[name].. 1 by mouth once daily as needed 6)  Colace 100 Mg Caps (Docusate sodium) .... As directed per bottle as needed 7)  Prednisone 10 Mg Tabs (Prednisone) .... 4 each am x 2days, 2x2days, 1x2days and stop 8)  Tramadol Hcl 50 Mg Tabs (Tramadol hcl) .... One to two by mouth every 4-6 hours  Patient Instructions: 1)  Prednisone 4 each am x 2days, 2x2days, 1x2days and stop 2)  Change pantoprazole to Take one 30-60 min before first and last meals of the day  3)  Take delsym two tsp every 12 hours and add  tramadol 50 mg up to every 4 hours to suppress the urge to cough. Swallowing water or using ice chips/non mint and menthol containing candies (such as lifesavers or sugarless jolly ranchers) are also effective.  4)  GERD (REFLUX)  is a common cause of respiratory symptoms. It commonly presents without heartburn and can be treated with medication, but also with lifestyle changes including avoidance of late meals, excessive alcohol, smoking cessation, and avoid fatty foods, chocolate, peppermint, colas, red wine, and acidic juices such as orange juice. NO MINT OR MENTHOL PRODUCTS SO NO COUGH DROPS  5)  USE SUGARLESS CANDY INSTEAD (jolley ranchers)  6)  NO OIL BASED VITAMINS  7)  Please schedule a follow-up appointment in 4 weeks, sooner if needed  Prescriptions: TRAMADOL HCL 50 MG  TABS (TRAMADOL HCL) One to two by mouth every 4-6 hours  #40 x 0   Entered and Authorized by:   Nyoka Cowden MD   Signed by:   Nyoka Cowden MD on 03/07/2010   Method used:   Electronically to        [facility]* (retail)       [address]       [place], [place]  [address]       Ph: [phone]       Fax: [phone]   RxID:   [id number] PREDNISONE 10 MG  TABS (PREDNISONE) 4 each am x 2days, 2x2days, 1x2days and stop  #14 x 0   Entered and Authorized by:   Nyoka Cowden MD   Signed by:   Nyoka Cowden MD on 03/07/2010   Method used:   Electronically to        [facility]* (retail)       [address]       [place], [place]  [address]       Ph: [phone]       Fax: [phone]   RxID:   [id number] PANTOPRAZOLE SODIUM 40 MG TBEC (PANTOPRAZOLE SODIUM) Take one 30-60 min before first and last meals of the day  #60 x 11   Entered and Authorized by:   Nyoka Cowden MD   Signed by:   Nyoka Cowden MD on 03/07/2010   Method used:   Electronically to        [facility]* (retail)       [address]       [place], [place]  [address]       Ph: [phone]       Fax: [phone]    RxID:   [id number]

## 2010-03-15 NOTE — Progress Notes (Signed)
Summary: [facility]: Office Visit  [facility]: Office Visit   Imported By: Earl Many 03/06/2010 16:02:35  _____________________________________________________________________  External Attachment:    Type:   Image     Comment:   External Document

## 2010-03-21 NOTE — Medication Information (Signed)
Summary: ROV/TM/AMD  Anticoagulant Therapy  Managed by: Bethena Midget, RN, BSN Referring MD: Antoine Poche PCP: Dr Roosvelt Harps MD: Mariah Milling Indication 1: Aortic Valve Replacement Indication 2: Atrial Fibrillation Valve Type: St Judes -- Mechanical INR POC 1.7 INR RANGE 2.0-3.0  Dietary changes: no    Health status changes: no    Bleeding/hemorrhagic complications: no    Recent/future hospitalizations: no    Any changes in medication regimen? yes       Details: Finished Prednisone this AM  Recent/future dental: no  Any missed doses?: no       Is patient compliant with meds? yes       Current Medications (verified): 1)  Coreg 6.25 Mg Tabs (Carvedilol) .[name].. 1 By Mouth Two Times A Day 2)  Ferrous Gluconate 324 Mg Tabs (Ferrous Gluconate) .... Take 1 Tablet By Mouth Two Times A Day 3)  Flonase 50 Mcg/act Susp (Fluticasone Propionate) .... 2 Sprays Each Nostril Daily 4)  Pantoprazole Sodium 40 Mg Tbec (Pantoprazole Sodium) .... Take One 30-60 Min Before First and Last Meals of The Day 5)  Creon 6000 Unit Cpep (Pancrelipase (Lip-Prot-Amyl)) .... 2 Every Am, 1 At [facility], and 1 Every Evening 6)  Warfarin Sodium 1 Mg Tabs (Warfarin Sodium) .... As Directed Per Clinic 7)  Ventolin Hfa 108 (90 Base) Mcg/act Aers (Albuterol Sulfate) .... As Directed 8)  Alprazolam 0.5 Mg Tabs (Alprazolam) .[name].. 1 At Bedtime As Needed 9)  Potassium Chloride Crys Cr 20 Meq Cr-Tabs (Potassium Chloride Crys Cr) .... One Daily 10)  Warfarin Sodium 1 Mg Tabs (Warfarin Sodium) .... Use As Directed By Anticoagualtion Clinic 11)  Delsym 30 Mg/65ml Lqcr (Dextromethorphan Polistirex) .... As Needed 12)  Furosemide 20 Mg Tabs (Furosemide) .[name].. 1 By Mouth Once Daily As Needed 13)  Colace 100 Mg Caps (Docusate Sodium) .... As Directed Per Bottle As Needed 14)  Tramadol Hcl 50 Mg  Tabs (Tramadol Hcl) .... One To Two By Mouth Every 4-6 Hours  Allergies: 1)  ! * Primaxin 2)  ! Ace Inhibitors  Anticoagulation  Management History:      The patient is taking warfarin and comes in today for a routine follow up visit.  Positive risk factors for bleeding include presence of serious comorbidities.  Negative risk factors for bleeding include an age less than 11 years old.  The bleeding index is 'intermediate risk'.  Positive CHADS2 values include History of HTN.  Negative CHADS2 values include Age > 69 years old.  His last INR was 6.6 ratio.  Anticoagulation responsible provider: Alajia Schmelzer.  INR POC: 1.7.  Cuvette Lot#: [id number].  Exp: 01/2011.    Anticoagulation Management Assessment/Plan:      The patient's current anticoagulation dose is Warfarin sodium 1 mg tabs: as directed per clinic, Warfarin sodium 1 mg tabs: Use as directed by Anticoagualtion Clinic.  The target INR is 2.0-3.0.  The next INR is due 03/28/2010.  Anticoagulation instructions were given to patient.  Results were reviewed/authorized by Bethena Midget, RN, BSN.  He was notified by Bethena Midget, RN, BSN.         Prior Anticoagulation Instructions: INR 2.5  continue 1mg  everyday except 2mg  on Tuesdays, Thursdays and Saturdays. Recheck in 4 weeks.   Current Anticoagulation Instructions: INR 1.7 Today take 1 pill then resume 1/2 pill everyday except 1 pill on Tuesdays, Thursdays and Saturdays. Recheck in 2 weeks.

## 2010-03-28 ENCOUNTER — Encounter: Payer: Self-pay | Admitting: Cardiovascular Disease

## 2010-03-28 ENCOUNTER — Encounter (INDEPENDENT_AMBULATORY_CARE_PROVIDER_SITE_OTHER): Payer: BC Managed Care – PPO

## 2010-03-28 DIAGNOSIS — Z7901 Long term (current) use of anticoagulants: Secondary | ICD-10-CM

## 2010-03-28 DIAGNOSIS — Z954 Presence of other heart-valve replacement: Secondary | ICD-10-CM

## 2010-03-28 DIAGNOSIS — I4891 Unspecified atrial fibrillation: Secondary | ICD-10-CM

## 2010-03-28 DIAGNOSIS — I359 Nonrheumatic aortic valve disorder, unspecified: Secondary | ICD-10-CM

## 2010-04-05 ENCOUNTER — Other Ambulatory Visit: Payer: Self-pay | Admitting: Internal Medicine

## 2010-04-05 ENCOUNTER — Ambulatory Visit (INDEPENDENT_AMBULATORY_CARE_PROVIDER_SITE_OTHER)
Admission: RE | Admit: 2010-04-05 | Discharge: 2010-04-05 | Disposition: A | Discharge status: Home / Self Care | Payer: BC Managed Care – PPO | Source: Ambulatory Visit | Attending: Internal Medicine | Admitting: Internal Medicine

## 2010-04-05 ENCOUNTER — Ambulatory Visit (INDEPENDENT_AMBULATORY_CARE_PROVIDER_SITE_OTHER): Payer: BC Managed Care – PPO | Admitting: Internal Medicine

## 2010-04-05 ENCOUNTER — Encounter: Payer: Self-pay | Admitting: Internal Medicine

## 2010-04-05 DIAGNOSIS — R05 Cough: Secondary | ICD-10-CM

## 2010-04-05 DIAGNOSIS — R059 Cough, unspecified: Secondary | ICD-10-CM

## 2010-04-05 DIAGNOSIS — I10 Essential (primary) hypertension: Secondary | ICD-10-CM

## 2010-04-05 NOTE — Letter (Signed)
Summary: [facility]  [facility]   Imported By: Kassie Mends 03/28/2010 08:19:43  _____________________________________________________________________  External Attachment:    Type:   Image     Comment:   External Document

## 2010-04-05 NOTE — Medication Information (Signed)
Summary: rov/tm  Anticoagulant Therapy  Managed by: Cloyde Reams, RN, BSN Referring MD: Antoine Poche PCP: Dr Roosvelt Harps MD: Mariah Milling Indication 1: Aortic Valve Replacement Indication 2: Atrial Fibrillation Valve Type: St Judes -- Mechanical INR POC 1.8 INR RANGE 2.0-3.0  Dietary changes: yes       Details: Incr appetite, no incr in vit K  Health status changes: no    Bleeding/hemorrhagic complications: no    Recent/future hospitalizations: no    Any changes in medication regimen? no    Recent/future dental: no  Any missed doses?: no       Is patient compliant with meds? yes       Allergies: 1)  ! * Primaxin 2)  ! Ace Inhibitors  Anticoagulation Management History:      The patient is taking warfarin and comes in today for a routine follow up visit.  Positive risk factors for bleeding include presence of serious comorbidities.  Negative risk factors for bleeding include an age less than 62 years old.  The bleeding index is 'intermediate risk'.  Positive CHADS2 values include History of HTN.  Negative CHADS2 values include Age > 29 years old.  His last INR was 6.6 ratio.  Anticoagulation responsible provider: Dorri Ozturk.  INR POC: 1.8.  Cuvette Lot#: [id number].  Exp: 01/2011.    Anticoagulation Management Assessment/Plan:      The patient's current anticoagulation dose is Warfarin sodium 1 mg tabs: as directed per clinic, Warfarin sodium 1 mg tabs: Use as directed by Anticoagualtion Clinic.  The target INR is 2.0-3.0.  The next INR is due 04/11/2010.  Anticoagulation instructions were given to patient.  Results were reviewed/authorized by Cloyde Reams, RN, BSN.  He was notified by Cloyde Reams RN.         Prior Anticoagulation Instructions: INR 1.7 Today take 1 pill then resume 1/2 pill everyday except 1 pill on Tuesdays, Thursdays and Saturdays. Recheck in 2 weeks.   Current Anticoagulation Instructions: INR 1.8  Take 1 tablet today, then start taking 1 tablet daily  except 1/2 tablet on Mondays, Wednesdays, and Fridays.  Recheck in 2 weeks.

## 2010-04-08 ENCOUNTER — Encounter: Payer: Self-pay | Admitting: Cardiovascular Disease

## 2010-04-10 NOTE — Assessment & Plan Note (Addendum)
Summary: Pulmonary/ chronic cough f/u, try off coreg     Copy to:  Dr. Durene Fruits Primary Provider/Referring Provider:  Dr Para March  CC:  Cough- little better and sob w/ activity--pt starts rehab next week. [name] [name]  History of Present Illness: 60 yowm never smoker with chronic cough ever since AVR June 2011 at [facility]  March 07, 2010  1st pulmonary office eval cc daily mostly dry hacky  cough waxes and wanes more in afternoon and evening only better after cough syrup assoc with new sensation of pnds x a year so with sensation of choking gagging and h/o dysphagia with choking on food in past but not since cough taking ppi one daily p breakfast since last GI eval ? name of doctor required previous dilation 2003 by [name] and also since then ? doctor ? date.    rec Prednisone 4 each am x 2days, 2x2days, 1x2days and stop Change pantoprazole to Take one 30-60 min before first and last meals of the day  Take delsym two tsp every 12 hours and add tramadol 50 mg up to every 4 hours to suppress the urge to cough. Swallowing water or using ice chips/non mint and menthol containing candies (such as lifesavers or sugarless jolly ranchers) are also effective.  GERD (REFLUX) diet  April 05, 2010 ov Cough- little better, sob w/ activity--pt starts rehab next week. did not control cough with tramadol effectively.  thinks flonase helping pnds, no change doe, no sign excess mucus production or purulent/ bloody secretions.  Pt denies any significant sore throat, dysphagia, itching, sneezing,  nasal congestion or excess secretions,  fever, chills, sweats, unintended wt loss, pleuritic or exertional cp, hempoptysis, change in activity tolerance  orthopnea pnd or leg swelling Pt also denies any obvious fluctuation in symptoms with weather or environmental change or other alleviating or aggravating factors.             Current Medications (verified): 1)  Coreg 6.25 Mg Tabs (Carvedilol) .[name] [name].. 1 By Mouth Two Times A Day 2)   Ferrous Gluconate 324 Mg Tabs (Ferrous Gluconate) .... Take 1 Tablet By Mouth Two Times A Day 3)  Flonase 50 Mcg/act Susp (Fluticasone Propionate) .... 2 Sprays Each Nostril Daily 4)  Pantoprazole Sodium 40 Mg Tbec (Pantoprazole Sodium) .... Take One 30-60 Min Before First and Last Meals of The Day 5)  Creon 6000 Unit Cpep (Pancrelipase (Lip-Prot-Amyl)) .... 2 Every Am, 1 At [facility], and 1 Every Evening 6)  Warfarin Sodium 1 Mg Tabs (Warfarin Sodium) .... As Directed Per Clinic 7)  Ventolin Hfa 108 (90 Base) Mcg/act Aers (Albuterol Sulfate) .... As Directed 8)  Potassium Chloride Crys Cr 20 Meq Cr-Tabs (Potassium Chloride Crys Cr) .... One Daily 9)  Warfarin Sodium 1 Mg Tabs (Warfarin Sodium) .... Use As Directed By Anticoagualtion Clinic 10)  Delsym 30 Mg/37ml Lqcr (Dextromethorphan Polistirex) .... As Needed 11)  Furosemide 20 Mg Tabs (Furosemide) .[name] [name].. 1 By Mouth Once Daily As Needed 12)  Colace 100 Mg Caps (Docusate Sodium) .... As Directed Per Bottle As Needed 13)  Tramadol Hcl 50 Mg  Tabs (Tramadol Hcl) .... One To Two By Mouth Every 4-6 Hours As Needed  Allergies (verified): 1)  ! * Primaxin 2)  ! Ace Inhibitors  Past History:  Past Medical History: Aortic insufficiency with a bicuspid aortic valve (the last MRI demonstrated the aortic root to be 4.8 x 4.7 cm.  There was moderate regurgitation.  He has normal chamber size with very mild left  ventricular dysfunction) gastroesophageal reflux disease dyslipidemia  duodenal ulcer with GI bleeding hypertension,  07/2009-Admisson for Pericardial effusion. Status post pericardial window  Acute pancreatitis with pancreatic necrosis. 08/2009- Admission for fever and syncope.  No source for fever seen on cultures.  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, also with ATN 10/2009 admission to [facility] with pneumonia and right pleural effusion status post  thoracentesis Cough onset June 2011........................................[name] KitchenWert GERD with stricture       - EGD with dilation  03/23/2001 ..........................[name] KitchenKaplan  Vital Signs:  Patient profile:   60 year old male Height:      71 inches Weight:      197 pounds BMI:     27.58 O2 Sat:      97 % on Room air Temp:     98.2 degrees F oral Pulse rate:   74 / minute BP sitting:   112 / 70  (right arm) Cuff size:   regular  Vitals Entered By: Carver Fila (April 05, 2010 11:09 AM)  O2 Flow:  Room air CC: Cough- little better, sob w/ activity--pt starts rehab next week.  Comments meds and allergies updated Phone number updated Carver Fila  April 05, 2010 11:08 AM    Physical Exam  Additional Exam:  wt 189 > 193 March 07, 2010 > 197 April 05, 2010  HEENT: nl dentition, turbinates, and orophanx. Nl external ear canals without cough reflex NECK :  without JVD/Nodes/TM/ nl carotid upstrokes bilaterally LUNGS: no acc muscle use, clear to A and P bilaterally without cough on insp or exp maneuvers CV:  RRR ,  mechanical S2 ,  no s3 or murmur or increase in P2, no edema  ABD:  soft and nontender with nl excursion in the supine position. No bruits or organomegaly, bowel sounds nl MS:  warm without deformities, calf tenderness, cyanosis or clubbing SKIN: warm and dry without lesions         CXR  Procedure date:  04/05/2010  Findings:       Comparison: Chest 12/05/2009.  Findings: There is some scarring in the right lung base.  Small right pleural effusion seen on the prior study has nearly completely resolved.  Left lung remains clear.  Postoperative change of median sternotomy noted.  IMPRESSION: Mild right basilar scar with a complete resolution of the small right effusion.  No new abnormality.  Impression & Recommendations:  Problem # 1:  COUGH (ICD-786.2)  The most common causes of chronic cough in immunocompetent adults include: upper airway cough syndrome  (UACS), previously referred to as postnasal drip syndrome,  caused by variety of rhinosinus conditions; (2) asthma; (3) GERD; (4) chronic bronchitis from cigarette smoking or other inhaled environmental irritants; (5) nonasthmatic eosinophilic bronchitis; and (6) bronchiectasis. These conditions, singly or in combination, have accounted for up to 94% of the causes of chronic cough in prospective studies.  Of the three most common causes of chronic cough, only one( GERD) can actually cause the other two (asthma and pnds) and perpetuate the cylce of cough inducing airway trauma, inflammation, heightened sensitivity to reflux which is prompted by the cough itself via a cyclical mechanism.  This may partially respond to steroids and look like asthma and post nasal drainage but never erradicated completely unless the cough and the secondary reflux are eliminated, preferably both at the same time.   He did not understand this concept the first time so  try again with next step sinus ct then trial of h1 per guidelines  The standardized cough guidelines recently published in Chest are a 14 step process, not a single office visit,  and are intended  to address this problem logically,  with an alogrithm dependent on response to each progressive step  to determine a specific diagnosis with  minimal addtional testing needed. Therefore if compliance is an issue this empiric standardized approach simply won't work.  See instructions for specific recommendations   Problem # 2:  HYPERTENSION (ICD-401.9) Since cough variant asthma in ddx makes sense to change to Bystolic, the most beta -1  selective Beta blocker available in sample form, with bisoprolol the most selective generic choice  on the market.    The following medications were removed from the medication list:    Coreg 6.25 Mg Tabs (Carvedilol) .[name] [name]... 1 by mouth two times a day His updated medication list for this problem includes:    Furosemide 20 Mg Tabs  (Furosemide) .[name] [name]... 1 by mouth once daily as needed    Bystolic 5 Mg Tabs (Nebivolol hcl) ..... One tablet daily    Medications Added to Medication List This Visit: 1)  Tramadol Hcl 50 Mg Tabs (Tramadol hcl) .... One to two by mouth every 4-6 hours as needed 2)  Tramadol Hcl 50 Mg Tabs (Tramadol hcl) .... One to two by mouth every 4-6 hours as needed for cough 3)  Bystolic 5 Mg Tabs (Nebivolol hcl) .... One tablet daily 4)  Prednisone 10 Mg Tabs (Prednisone) .... 4 each am x 2days, 2x2days, 1x2days and stop  Other Orders: T-2 View CXR (71020TC) Est. Patient Level IV (08657)  Patient Instructions: 1)  Increase flonase to twice daily and if nose still drips try chlortrimeton 4mg  one every 6 hours to dry it up 2)  Stop Coreg  3)  Start Bystolic 5 mg daily 4)  Prednisone 4 each am x 2days, 2x2days, 1x2days and stop 5)   pantoprazole to Take one 30-60 min before first and last meals of the day  6)  Take delsym two tsp every 12 hours and add tramadol 50 mg up to 2 every 4 hours to suppress the urge to cough. Swallowing water or using ice chips/non mint and menthol containing candies (such as lifesavers or sugarless jolly ranchers) are also effective.  7)  GERD (REFLUX)  is a common cause of respiratory symptoms. It commonly presents without heartburn and can be treated with medication, but also with lifestyle changes including avoidance of late meals, excessive alcohol, smoking cessation, and avoid fatty foods, chocolate, peppermint, colas, red wine, and acidic juices such as orange juice. NO MINT OR MENTHOL PRODUCTS SO NO COUGH DROPS  8)  USE SUGARLESS CANDY INSTEAD (jolley ranchers)  9)  NO OIL BASED VITAMINS  10)  Please schedule a follow-up appointment in 4 weeks, sooner if needed  Prescriptions: PREDNISONE 10 MG  TABS (PREDNISONE) 4 each am x 2days, 2x2days, 1x2days and stop  #40 x 0   Entered and Authorized by:   Nyoka Cowden MD   Signed by:   Nyoka Cowden MD on 04/05/2010   Method  used:   Electronically to        [facility]* (retail)       [address]       [place], [place]  [address]       Ph: [phone]       Fax: [phone]   RxID:   [id number] TRAMADOL  HCL 50 MG  TABS (TRAMADOL HCL) One to two by mouth every 4-6 hours as needed for cough  #40 x 0   Entered and Authorized by:   Nyoka Cowden MD   Signed by:   Nyoka Cowden MD on 04/05/2010   Method used:   Electronically to        [facility]* (retail)       [address]       [place], [place]  [address]       Ph: [phone]       Fax: [phone]   RxID:   [id number]    Immunization History:  Influenza Immunization History:    Influenza:  historical (10/28/2009)

## 2010-04-11 ENCOUNTER — Encounter (INDEPENDENT_AMBULATORY_CARE_PROVIDER_SITE_OTHER): Payer: BC Managed Care – PPO

## 2010-04-11 ENCOUNTER — Encounter: Payer: Self-pay | Admitting: Cardiovascular Disease

## 2010-04-11 DIAGNOSIS — Z7901 Long term (current) use of anticoagulants: Secondary | ICD-10-CM

## 2010-04-11 DIAGNOSIS — I359 Nonrheumatic aortic valve disorder, unspecified: Secondary | ICD-10-CM

## 2010-04-11 LAB — URINALYSIS, ROUTINE W REFLEX MICROSCOPIC
Glucose, UA: NEGATIVE mg/dL
Glucose, UA: NEGATIVE mg/dL
Hgb urine dipstick: NEGATIVE
Ketones, ur: NEGATIVE mg/dL
Ketones, ur: NEGATIVE mg/dL
Nitrite: NEGATIVE
Protein, ur: NEGATIVE mg/dL
Protein, ur: NEGATIVE mg/dL
pH: 6 (ref 5.0–8.0)
pH: 7 (ref 5.0–8.0)

## 2010-04-11 LAB — COMPREHENSIVE METABOLIC PANEL
ALT: 24 U/L (ref 0–53)
AST: 52 U/L — ABNORMAL HIGH (ref 0–37)
AST: 41 U/L — ABNORMAL HIGH (ref 0–37)
Albumin: 2.5 g/dL — ABNORMAL LOW (ref 3.5–5.2)
Alkaline Phosphatase: 99 U/L (ref 39–117)
BUN: 9 mg/dL (ref 6–23)
CO2: 24 mEq/L (ref 19–32)
Calcium: 8.2 mg/dL — ABNORMAL LOW (ref 8.4–10.5)
Calcium: 8.2 mg/dL — ABNORMAL LOW (ref 8.4–10.5)
Chloride: 104 mEq/L (ref 96–112)
Creatinine, Ser: 0.81 mg/dL (ref 0.4–1.5)
GFR calc Af Amer: >60 mL/min (ref >60)
GFR calc non Af Amer: >60 mL/min (ref >60)
Glucose, Bld: 144 mg/dL — ABNORMAL HIGH (ref 70–99)
Sodium: 136 mEq/L (ref 135–145)
Total Bilirubin: 1 mg/dL (ref 0.3–1.2)
Total Bilirubin: 0.5 mg/dL (ref 0.3–1.2)

## 2010-04-11 LAB — CBC
HCT: 30.6 % — ABNORMAL LOW (ref 39.0–52.0)
HCT: 31 % — ABNORMAL LOW (ref 39.0–52.0)
HCT: 31.8 % — ABNORMAL LOW (ref 39.0–52.0)
HCT: 32.1 % — ABNORMAL LOW (ref 39.0–52.0)
HCT: 29.9 % — ABNORMAL LOW (ref 39.0–52.0)
Hemoglobin: 10.1 g/dL — ABNORMAL LOW (ref 13.0–17.0)
Hemoglobin: 10.4 g/dL — ABNORMAL LOW (ref 13.0–17.0)
Hemoglobin: 9.7 g/dL — ABNORMAL LOW (ref 13.0–17.0)
MCH: 26.5 pg (ref 26.0–34.0)
MCH: 26.6 pg (ref 26.0–34.0)
MCH: 26.6 pg (ref 26.0–34.0)
MCHC: 32.7 g/dL (ref 30.0–36.0)
MCHC: 32.7 g/dL (ref 30.0–36.0)
MCHC: 32.4 g/dL (ref 30.0–36.0)
MCV: 81.4 fL (ref 78.0–100.0)
MCV: 80.3 fL (ref 78.0–100.0)
Platelets: 217 10*3/uL (ref 150–400)
Platelets: 251 10*3/uL (ref 150–400)
Platelets: 308 10*3/uL (ref 150–400)
Platelets: 233 10*3/uL (ref 150–400)
RBC: 3.86 MIL/uL — ABNORMAL LOW (ref 4.22–5.81)
RBC: 3.92 MIL/uL — ABNORMAL LOW (ref 4.22–5.81)
RBC: 3.72 MIL/uL — ABNORMAL LOW (ref 4.22–5.81)
RDW: 17.9 % — ABNORMAL HIGH (ref 11.5–15.5)
RDW: 18.2 % — ABNORMAL HIGH (ref 11.5–15.5)
RDW: 18.3 % — ABNORMAL HIGH (ref 11.5–15.5)
RDW: 16.4 % — ABNORMAL HIGH (ref 11.5–15.5)
WBC: 5.8 10*3/uL (ref 4.0–10.5)
WBC: 6.4 10*3/uL (ref 4.0–10.5)
WBC: 7.4 10*3/uL (ref 4.0–10.5)

## 2010-04-11 LAB — URINE MICROSCOPIC-ADD ON

## 2010-04-11 LAB — BRAIN NATRIURETIC PEPTIDE: Pro B Natriuretic peptide (BNP): 154 pg/mL — ABNORMAL HIGH (ref 0.0–100.0)

## 2010-04-11 LAB — BASIC METABOLIC PANEL
BUN: 8 mg/dL (ref 6–23)
BUN: 10 mg/dL (ref 6–23)
CO2: 24 mEq/L (ref 19–32)
Chloride: 96 mEq/L (ref 96–112)
Creatinine, Ser: 0.95 mg/dL (ref 0.4–1.5)
Creatinine, Ser: 1.11 mg/dL (ref 0.4–1.5)
GFR calc Af Amer: >60 mL/min (ref >60)
GFR calc non Af Amer: >60 mL/min (ref >60)
Glucose, Bld: 96 mg/dL (ref 70–99)
Potassium: 4.4 mEq/L (ref 3.5–5.1)

## 2010-04-11 LAB — PROTIME-INR
INR: 1.55 — ABNORMAL HIGH (ref 0.00–1.49)
INR: 1.64 — ABNORMAL HIGH (ref 0.00–1.49)
INR: 2.04 — ABNORMAL HIGH (ref 0.00–1.49)
INR: 2.06 — ABNORMAL HIGH (ref 0.00–1.49)
INR: 2.13 — ABNORMAL HIGH (ref 0.00–1.49)
Prothrombin Time: 18.4 seconds — ABNORMAL HIGH (ref 11.6–15.2)
Prothrombin Time: 18.8 seconds — ABNORMAL HIGH (ref 11.6–15.2)
Prothrombin Time: 23.2 seconds — ABNORMAL HIGH (ref 11.6–15.2)
Prothrombin Time: 23.4 seconds — ABNORMAL HIGH (ref 11.6–15.2)
Prothrombin Time: 28.6 seconds — ABNORMAL HIGH (ref 11.6–15.2)
Prothrombin Time: 44.3 seconds — ABNORMAL HIGH (ref 11.6–15.2)
Prothrombin Time: 46.5 seconds — ABNORMAL HIGH (ref 11.6–15.2)

## 2010-04-11 LAB — MRSA PCR SCREENING: MRSA by PCR: NEGATIVE

## 2010-04-11 LAB — HEPARIN LEVEL (UNFRACTIONATED)
Heparin Unfractionated: 0.19 IU/mL — ABNORMAL LOW (ref 0.30–0.70)
Heparin Unfractionated: 0.21 IU/mL — ABNORMAL LOW (ref 0.30–0.70)
Heparin Unfractionated: 0.26 IU/mL — ABNORMAL LOW (ref 0.30–0.70)
Heparin Unfractionated: 0.4 IU/mL (ref 0.30–0.70)
Heparin Unfractionated: 0.4 IU/mL (ref 0.30–0.70)

## 2010-04-11 LAB — CULTURE, BLOOD (ROUTINE X 2)
Culture  Setup Time: 201110290330
Culture: NO GROWTH

## 2010-04-11 LAB — DIFFERENTIAL
Basophils Absolute: 0 10*3/uL (ref 0.0–0.1)
Eosinophils Relative: 1 % (ref 0–5)
Lymphocytes Relative: 21 % (ref 12–46)
Lymphs Abs: 1.2 10*3/uL (ref 0.7–4.0)
Monocytes Absolute: 1.2 10*3/uL — ABNORMAL HIGH (ref 0.1–1.0)
Neutro Abs: 3.5 10*3/uL (ref 1.7–7.7)

## 2010-04-11 LAB — GLUCOSE, CAPILLARY: Glucose-Capillary: 109 mg/dL — ABNORMAL HIGH (ref 70–99)

## 2010-04-11 LAB — LIPASE, BLOOD: Lipase: 33 U/L (ref 11–59)

## 2010-04-11 LAB — URINE CULTURE
Colony Count: NO GROWTH
Culture  Setup Time: 201110290047

## 2010-04-12 LAB — BODY FLUID CULTURE: Culture: NO GROWTH

## 2010-04-12 LAB — COMPREHENSIVE METABOLIC PANEL
ALT: 22 U/L (ref 0–53)
ALT: 28 U/L (ref 0–53)
ALT: 17 U/L (ref 0–53)
AST: 64 U/L — ABNORMAL HIGH (ref 0–37)
AST: 19 U/L (ref 0–37)
AST: 31 U/L (ref 0–37)
Albumin: 2 g/dL — ABNORMAL LOW (ref 3.5–5.2)
Albumin: 2 g/dL — ABNORMAL LOW (ref 3.5–5.2)
Albumin: 2.2 g/dL — ABNORMAL LOW (ref 3.5–5.2)
Albumin: 2.5 g/dL — ABNORMAL LOW (ref 3.5–5.2)
Alkaline Phosphatase: 100 U/L (ref 39–117)
Alkaline Phosphatase: 133 U/L — ABNORMAL HIGH (ref 39–117)
Alkaline Phosphatase: 72 U/L (ref 39–117)
Alkaline Phosphatase: 91 U/L (ref 39–117)
BUN: 10 mg/dL (ref 6–23)
BUN: 15 mg/dL (ref 6–23)
BUN: 16 mg/dL (ref 6–23)
BUN: 9 mg/dL (ref 6–23)
BUN: 20 mg/dL (ref 6–23)
BUN: 11 mg/dL (ref 6–23)
BUN: 9 mg/dL (ref 6–23)
BUN: 9 mg/dL (ref 6–23)
CO2: 23 mEq/L (ref 19–32)
CO2: 29 mEq/L (ref 19–32)
CO2: 31 mEq/L (ref 19–32)
CO2: 22 mEq/L (ref 19–32)
CO2: 23 mEq/L (ref 19–32)
CO2: 24 mEq/L (ref 19–32)
Calcium: 7.7 mg/dL — ABNORMAL LOW (ref 8.4–10.5)
Calcium: 7.7 mg/dL — ABNORMAL LOW (ref 8.4–10.5)
Calcium: 8.1 mg/dL — ABNORMAL LOW (ref 8.4–10.5)
Calcium: 8.1 mg/dL — ABNORMAL LOW (ref 8.4–10.5)
Calcium: 8.2 mg/dL — ABNORMAL LOW (ref 8.4–10.5)
Chloride: 90 mEq/L — ABNORMAL LOW (ref 96–112)
Chloride: 97 mEq/L (ref 96–112)
Chloride: 101 mEq/L (ref 96–112)
Chloride: 101 mEq/L (ref 96–112)
Chloride: 105 mEq/L (ref 96–112)
Creatinine, Ser: 0.88 mg/dL (ref 0.4–1.5)
Creatinine, Ser: 0.94 mg/dL (ref 0.4–1.5)
Creatinine, Ser: 1.06 mg/dL (ref 0.4–1.5)
Creatinine, Ser: 3.02 mg/dL — ABNORMAL HIGH (ref 0.4–1.5)
Creatinine, Ser: 0.79 mg/dL (ref 0.4–1.5)
Creatinine, Ser: 0.83 mg/dL (ref 0.4–1.5)
Creatinine, Ser: 0.85 mg/dL (ref 0.4–1.5)
GFR calc Af Amer: >60 mL/min (ref >60)
GFR calc Af Amer: >60 mL/min (ref >60)
GFR calc Af Amer: >60 mL/min (ref >60)
GFR calc non Af Amer: >60 mL/min (ref >60)
GFR calc non Af Amer: >60 mL/min (ref >60)
GFR calc non Af Amer: >60 mL/min (ref >60)
GFR calc non Af Amer: 22 mL/min — ABNORMAL LOW (ref >60)
GFR calc non Af Amer: >60 mL/min (ref >60)
GFR calc non Af Amer: >60 mL/min (ref >60)
GFR calc non Af Amer: >60 mL/min (ref >60)
Glucose, Bld: 114 mg/dL — ABNORMAL HIGH (ref 70–99)
Glucose, Bld: 133 mg/dL — ABNORMAL HIGH (ref 70–99)
Glucose, Bld: 95 mg/dL (ref 70–99)
Glucose, Bld: 112 mg/dL — ABNORMAL HIGH (ref 70–99)
Glucose, Bld: 119 mg/dL — ABNORMAL HIGH (ref 70–99)
Glucose, Bld: 121 mg/dL — ABNORMAL HIGH (ref 70–99)
Glucose, Bld: 126 mg/dL — ABNORMAL HIGH (ref 70–99)
Glucose, Bld: 131 mg/dL — ABNORMAL HIGH (ref 70–99)
Potassium: 3.4 mEq/L — ABNORMAL LOW (ref 3.5–5.1)
Potassium: 3.5 mEq/L (ref 3.5–5.1)
Potassium: 6.2 mEq/L — ABNORMAL HIGH (ref 3.5–5.1)
Potassium: 3.6 mEq/L (ref 3.5–5.1)
Sodium: 131 mEq/L — ABNORMAL LOW (ref 135–145)
Sodium: 131 mEq/L — ABNORMAL LOW (ref 135–145)
Sodium: 134 mEq/L — ABNORMAL LOW (ref 135–145)
Total Bilirubin: 1.1 mg/dL (ref 0.3–1.2)
Total Bilirubin: 0.9 mg/dL (ref 0.3–1.2)
Total Bilirubin: 0.7 mg/dL (ref 0.3–1.2)
Total Bilirubin: 1.1 mg/dL (ref 0.3–1.2)
Total Bilirubin: 1.5 mg/dL — ABNORMAL HIGH (ref 0.3–1.2)
Total Protein: 5.6 g/dL — ABNORMAL LOW (ref 6.0–8.3)
Total Protein: 6.2 g/dL (ref 6.0–8.3)

## 2010-04-12 LAB — COMPREHENSIVE METABOLIC PANEL WITH GFR
ALT: 34 U/L (ref 0–53)
AST: 62 U/L — ABNORMAL HIGH (ref 0–37)
Albumin: 1.8 g/dL — ABNORMAL LOW (ref 3.5–5.2)
Alkaline Phosphatase: 87 U/L (ref 39–117)
BUN: 18 mg/dL (ref 6–23)
CO2: 34 meq/L — ABNORMAL HIGH (ref 19–32)
Calcium: 7.5 mg/dL — ABNORMAL LOW (ref 8.4–10.5)
Chloride: 90 meq/L — ABNORMAL LOW (ref 96–112)
Creatinine, Ser: 1.02 mg/dL (ref 0.4–1.5)
GFR calc non Af Amer: >60 mL/min
Glucose, Bld: 167 mg/dL — ABNORMAL HIGH (ref 70–99)
Potassium: 3 meq/L — ABNORMAL LOW (ref 3.5–5.1)
Sodium: 133 meq/L — ABNORMAL LOW (ref 135–145)
Total Bilirubin: 0.8 mg/dL (ref 0.3–1.2)
Total Protein: 5.2 g/dL — ABNORMAL LOW (ref 6.0–8.3)

## 2010-04-12 LAB — TROPONIN I
Troponin I: 0.02 ng/mL (ref 0.00–0.06)
Troponin I: 0.05 ng/mL (ref 0.00–0.06)

## 2010-04-12 LAB — PROTIME-INR
INR: 2.37 — ABNORMAL HIGH (ref 0.00–1.49)
INR: 2.44 — ABNORMAL HIGH (ref 0.00–1.49)
INR: 2.51 — ABNORMAL HIGH (ref 0.00–1.49)
INR: 2.83 — ABNORMAL HIGH (ref 0.00–1.49)
INR: 4.53 — ABNORMAL HIGH (ref 0.00–1.49)
INR: 5.97 (ref 0.00–1.49)
INR: 3.38 — ABNORMAL HIGH (ref 0.00–1.49)
INR: 4.1 — ABNORMAL HIGH (ref 0.00–1.49)
INR: 1.54 — ABNORMAL HIGH (ref 0.00–1.49)
INR: 1.73 — ABNORMAL HIGH (ref 0.00–1.49)
INR: 1.88 — ABNORMAL HIGH (ref 0.00–1.49)
INR: 1.92 — ABNORMAL HIGH (ref 0.00–1.49)
Prothrombin Time: 26 seconds — ABNORMAL HIGH (ref 11.6–15.2)
Prothrombin Time: 26.6 seconds — ABNORMAL HIGH (ref 11.6–15.2)
Prothrombin Time: 27.2 seconds — ABNORMAL HIGH (ref 11.6–15.2)
Prothrombin Time: 49.7 seconds — ABNORMAL HIGH (ref 11.6–15.2)
Prothrombin Time: 28.5 seconds — ABNORMAL HIGH (ref 11.6–15.2)
Prothrombin Time: 34.2 s — ABNORMAL HIGH (ref 11.6–15.2)
Prothrombin Time: 39.7 seconds — ABNORMAL HIGH (ref 11.6–15.2)
Prothrombin Time: 18.7 seconds — ABNORMAL HIGH (ref 11.6–15.2)
Prothrombin Time: 21.8 seconds — ABNORMAL HIGH (ref 11.6–15.2)
Prothrombin Time: 22.1 seconds — ABNORMAL HIGH (ref 11.6–15.2)
Prothrombin Time: 23.8 seconds — ABNORMAL HIGH (ref 11.6–15.2)

## 2010-04-12 LAB — CBC
HCT: 27.7 % — ABNORMAL LOW (ref 39.0–52.0)
HCT: 29.5 % — ABNORMAL LOW (ref 39.0–52.0)
HCT: 32.1 % — ABNORMAL LOW (ref 39.0–52.0)
HCT: 34.7 % — ABNORMAL LOW (ref 39.0–52.0)
HCT: 35 % — ABNORMAL LOW (ref 39.0–52.0)
HCT: 31 % — ABNORMAL LOW (ref 39.0–52.0)
HCT: 34.1 % — ABNORMAL LOW (ref 39.0–52.0)
HCT: 42.5 % (ref 39.0–52.0)
HCT: 32.9 % — ABNORMAL LOW (ref 39.0–52.0)
HCT: 33.4 % — ABNORMAL LOW (ref 39.0–52.0)
HCT: 34 % — ABNORMAL LOW (ref 39.0–52.0)
Hemoglobin: 10.9 g/dL — ABNORMAL LOW (ref 13.0–17.0)
Hemoglobin: 11.5 g/dL — ABNORMAL LOW (ref 13.0–17.0)
Hemoglobin: 11.7 g/dL — ABNORMAL LOW (ref 13.0–17.0)
Hemoglobin: 10.4 g/dL — ABNORMAL LOW (ref 13.0–17.0)
Hemoglobin: 11.6 g/dL — ABNORMAL LOW (ref 13.0–17.0)
Hemoglobin: 10.9 g/dL — ABNORMAL LOW (ref 13.0–17.0)
Hemoglobin: 11.1 g/dL — ABNORMAL LOW (ref 13.0–17.0)
Hemoglobin: 13.3 g/dL (ref 13.0–17.0)
MCH: 26.6 pg (ref 26.0–34.0)
MCH: 27.1 pg (ref 26.0–34.0)
MCH: 27.1 pg (ref 26.0–34.0)
MCH: 27.3 pg (ref 26.0–34.0)
MCH: 27.1 pg (ref 26.0–34.0)
MCH: 27.4 pg (ref 26.0–34.0)
MCH: 28 pg (ref 26.0–34.0)
MCH: 27.6 pg (ref 26.0–34.0)
MCH: 28 pg (ref 26.0–34.0)
MCH: 28.4 pg (ref 26.0–34.0)
MCH: 29 pg (ref 26.0–34.0)
MCHC: 33.1 g/dL (ref 30.0–36.0)
MCHC: 33.6 g/dL (ref 30.0–36.0)
MCHC: 33.5 g/dL (ref 30.0–36.0)
MCHC: 34 g/dL (ref 30.0–36.0)
MCHC: 32.1 g/dL (ref 30.0–36.0)
MCHC: 32.6 g/dL (ref 30.0–36.0)
MCHC: 33 g/dL (ref 30.0–36.0)
MCHC: 33.2 g/dL (ref 30.0–36.0)
MCV: 78.3 fL (ref 78.0–100.0)
MCV: 80.1 fL (ref 78.0–100.0)
MCV: 81.4 fL (ref 78.0–100.0)
MCV: 81.6 fL (ref 78.0–100.0)
MCV: 81.8 fL (ref 78.0–100.0)
MCV: 79.7 fL (ref 78.0–100.0)
MCV: 81.6 fL (ref 78.0–100.0)
MCV: 83.3 fL (ref 78.0–100.0)
MCV: 85 fL (ref 78.0–100.0)
MCV: 87.2 fL (ref 78.0–100.0)
MCV: 87.8 fL (ref 78.0–100.0)
Platelets: 286 10*3/uL (ref 150–400)
Platelets: 225 K/uL (ref 150–400)
Platelets: 243 K/uL (ref 150–400)
Platelets: 319 10*3/uL (ref 150–400)
Platelets: 103 10*3/uL — ABNORMAL LOW (ref 150–400)
Platelets: 144 10*3/uL — ABNORMAL LOW (ref 150–400)
RBC: 3.46 MIL/uL — ABNORMAL LOW (ref 4.22–5.81)
RBC: 4.1 MIL/uL — ABNORMAL LOW (ref 4.22–5.81)
RBC: 4.28 MIL/uL (ref 4.22–5.81)
RBC: 3.8 MIL/uL — ABNORMAL LOW (ref 4.22–5.81)
RBC: 4.28 MIL/uL (ref 4.22–5.81)
RBC: 5.1 MIL/uL (ref 4.22–5.81)
RBC: 3.87 MIL/uL — ABNORMAL LOW (ref 4.22–5.81)
RBC: 3.95 MIL/uL — ABNORMAL LOW (ref 4.22–5.81)
RBC: 4.59 MIL/uL (ref 4.22–5.81)
RDW: 16.4 % — ABNORMAL HIGH (ref 11.5–15.5)
RDW: 16.6 % — ABNORMAL HIGH (ref 11.5–15.5)
RDW: 16 % — ABNORMAL HIGH (ref 11.5–15.5)
RDW: 16.2 % — ABNORMAL HIGH (ref 11.5–15.5)
RDW: 15 % (ref 11.5–15.5)
RDW: 15 % (ref 11.5–15.5)
WBC: 6.3 10*3/uL (ref 4.0–10.5)
WBC: 8.2 10*3/uL (ref 4.0–10.5)
WBC: 8.8 10*3/uL (ref 4.0–10.5)
WBC: 9.1 10*3/uL (ref 4.0–10.5)
WBC: 11.7 K/uL — ABNORMAL HIGH (ref 4.0–10.5)
WBC: 9.2 K/uL (ref 4.0–10.5)
WBC: 4.8 10*3/uL (ref 4.0–10.5)

## 2010-04-12 LAB — BASIC METABOLIC PANEL WITH GFR
BUN: 13 mg/dL (ref 6–23)
BUN: 22 mg/dL (ref 6–23)
CO2: 15 meq/L — ABNORMAL LOW (ref 19–32)
CO2: 33 meq/L — ABNORMAL HIGH (ref 19–32)
Calcium: 7.9 mg/dL — ABNORMAL LOW (ref 8.4–10.5)
Calcium: 8.5 mg/dL (ref 8.4–10.5)
Chloride: 101 meq/L (ref 96–112)
Chloride: 92 meq/L — ABNORMAL LOW (ref 96–112)
Creatinine, Ser: 0.95 mg/dL (ref 0.4–1.5)
Creatinine, Ser: 2.4 mg/dL — ABNORMAL HIGH (ref 0.4–1.5)
GFR calc non Af Amer: 28 mL/min — ABNORMAL LOW
GFR calc non Af Amer: >60 mL/min
Glucose, Bld: 116 mg/dL — ABNORMAL HIGH (ref 70–99)
Glucose, Bld: 123 mg/dL — ABNORMAL HIGH (ref 70–99)
Potassium: 3.7 meq/L (ref 3.5–5.1)
Potassium: 5 meq/L (ref 3.5–5.1)
Sodium: 128 meq/L — ABNORMAL LOW (ref 135–145)
Sodium: 135 meq/L (ref 135–145)

## 2010-04-12 LAB — MAGNESIUM
Magnesium: 1.9 mg/dL (ref 1.5–2.5)
Magnesium: 1.8 mg/dL (ref 1.5–2.5)
Magnesium: 2 mg/dL (ref 1.5–2.5)

## 2010-04-12 LAB — POCT I-STAT 3, ART BLOOD GAS (G3+)
Acid-base deficit: 9 mmol/L — ABNORMAL HIGH (ref 0.0–2.0)
Bicarbonate: 14 meq/L — ABNORMAL LOW (ref 20.0–24.0)
O2 Saturation: 94 %
Patient temperature: 98.7
TCO2: 15 mmol/L (ref 0–100)
pCO2 arterial: 24.3 mmHg — ABNORMAL LOW (ref 35.0–45.0)
pH, Arterial: 7.369 (ref 7.350–7.450)
pO2, Arterial: 73 mmHg — ABNORMAL LOW (ref 80.0–100.0)

## 2010-04-12 LAB — URINE MICROSCOPIC-ADD ON

## 2010-04-12 LAB — CULTURE, BLOOD (ROUTINE X 2)
Culture  Setup Time: 201109290246
Culture  Setup Time: 201109130202
Culture  Setup Time: 201109130203
Culture  Setup Time: 201109140216
Culture: NO GROWTH
Culture: NO GROWTH
Culture: NO GROWTH
Culture: NO GROWTH
Culture: NO GROWTH
Culture: NO GROWTH

## 2010-04-12 LAB — LACTATE DEHYDROGENASE, PLEURAL OR PERITONEAL FLUID: LD, Fluid: 576 U/L — ABNORMAL HIGH (ref 3–23)

## 2010-04-12 LAB — DIFFERENTIAL
Basophils Absolute: 0 K/uL (ref 0.0–0.1)
Basophils Relative: 0 % (ref 0–1)
Basophils Relative: 0 % (ref 0–1)
Eosinophils Absolute: 0 K/uL (ref 0.0–0.7)
Eosinophils Relative: 0 % (ref 0–5)
Eosinophils Relative: 0 % (ref 0–5)
Lymphocytes Relative: 23 % (ref 12–46)
Lymphocytes Relative: 12 % (ref 12–46)
Lymphocytes Relative: 22 % (ref 12–46)
Lymphs Abs: 2 10*3/uL (ref 0.7–4.0)
Lymphs Abs: 1 10*3/uL (ref 0.7–4.0)
Lymphs Abs: 1.5 K/uL (ref 0.7–4.0)
Monocytes Absolute: 1.8 K/uL — ABNORMAL HIGH (ref 0.1–1.0)
Monocytes Relative: 21 % — ABNORMAL HIGH (ref 3–12)
Monocytes Relative: 14 % — ABNORMAL HIGH (ref 3–12)
Neutro Abs: 5 10*3/uL (ref 1.7–7.7)
Neutro Abs: 2.8 10*3/uL (ref 1.7–7.7)
Neutro Abs: 9.1 K/uL — ABNORMAL HIGH (ref 1.7–7.7)
Neutrophils Relative %: 62 % (ref 43–77)
Neutrophils Relative %: 73 % (ref 43–77)

## 2010-04-12 LAB — APTT
aPTT: 61 seconds — ABNORMAL HIGH (ref 24–37)
aPTT: 43 seconds — ABNORMAL HIGH (ref 24–37)
aPTT: 50 seconds — ABNORMAL HIGH (ref 24–37)
aPTT: 31 s (ref 24–37)
aPTT: 45 seconds — ABNORMAL HIGH (ref 24–37)

## 2010-04-12 LAB — URINALYSIS, ROUTINE W REFLEX MICROSCOPIC
Bilirubin Urine: NEGATIVE
Glucose, UA: NEGATIVE mg/dL
Ketones, ur: 15 mg/dL — AB
Ketones, ur: 15 mg/dL — AB
Ketones, ur: NEGATIVE mg/dL
Nitrite: NEGATIVE
Nitrite: POSITIVE — AB
Nitrite: NEGATIVE
Protein, ur: 30 mg/dL — AB
Protein, ur: 30 mg/dL — AB
Specific Gravity, Urine: 1.012 (ref 1.005–1.030)
Urobilinogen, UA: 0.2 mg/dL (ref 0.0–1.0)
Urobilinogen, UA: 0.2 mg/dL (ref 0.0–1.0)
pH: 5 (ref 5.0–8.0)
pH: 5 (ref 5.0–8.0)

## 2010-04-12 LAB — URINE CULTURE
Colony Count: 6000
Culture  Setup Time: 201109282124
Culture  Setup Time: 201109121936
Culture: NO GROWTH

## 2010-04-12 LAB — PREPARE FRESH FROZEN PLASMA

## 2010-04-12 LAB — CROSSMATCH
ABO/RH(D): O POS
Antibody Screen: NEGATIVE

## 2010-04-12 LAB — POCT I-STAT, CHEM 8
Glucose, Bld: 130 mg/dL — ABNORMAL HIGH (ref 70–99)
HCT: 42 % (ref 39.0–52.0)
Hemoglobin: 14.3 g/dL (ref 13.0–17.0)
Potassium: 3.5 mEq/L (ref 3.5–5.1)

## 2010-04-12 LAB — CK TOTAL AND CKMB (NOT AT ARMC)
CK, MB: 0.8 ng/mL (ref 0.3–4.0)
CK, MB: 0.9 ng/mL (ref 0.3–4.0)
Relative Index: INVALID (ref 0.0–2.5)
Relative Index: INVALID (ref 0.0–2.5)
Total CK: 40 U/L (ref 7–232)
Total CK: 65 U/L (ref 7–232)
Total CK: 79 U/L (ref 7–232)

## 2010-04-12 LAB — BASIC METABOLIC PANEL
BUN: 23 mg/dL (ref 6–23)
Calcium: 8.5 mg/dL (ref 8.4–10.5)
Chloride: 98 mEq/L (ref 96–112)
Chloride: 100 mEq/L (ref 96–112)
Creatinine, Ser: 2.08 mg/dL — ABNORMAL HIGH (ref 0.4–1.5)
GFR calc Af Amer: >60 mL/min (ref >60)
GFR calc Af Amer: >60 mL/min (ref >60)
GFR calc non Af Amer: 40 mL/min — ABNORMAL LOW (ref >60)
GFR calc non Af Amer: >60 mL/min (ref >60)
Potassium: 4.9 mEq/L (ref 3.5–5.1)
Potassium: 4.4 mEq/L (ref 3.5–5.1)
Potassium: 4.1 mEq/L (ref 3.5–5.1)
Sodium: 129 mEq/L — ABNORMAL LOW (ref 135–145)
Sodium: 130 mEq/L — ABNORMAL LOW (ref 135–145)
Sodium: 136 mEq/L (ref 135–145)

## 2010-04-12 LAB — BRAIN NATRIURETIC PEPTIDE
Pro B Natriuretic peptide (BNP): 72 pg/mL (ref 0.0–100.0)
Pro B Natriuretic peptide (BNP): 75 pg/mL (ref 0.0–100.0)
Pro B Natriuretic peptide (BNP): 129 pg/mL — ABNORMAL HIGH (ref 0.0–100.0)

## 2010-04-12 LAB — LEGIONELLA ANTIGEN, URINE: Legionella Antigen, Urine: NEGATIVE

## 2010-04-12 LAB — TSH: TSH: 0.574 u[IU]/mL (ref 0.350–4.500)

## 2010-04-12 LAB — GLUCOSE, CAPILLARY
Glucose-Capillary: 109 mg/dL — ABNORMAL HIGH (ref 70–99)
Glucose-Capillary: 110 mg/dL — ABNORMAL HIGH (ref 70–99)
Glucose-Capillary: 126 mg/dL — ABNORMAL HIGH (ref 70–99)
Glucose-Capillary: 140 mg/dL — ABNORMAL HIGH (ref 70–99)
Glucose-Capillary: 141 mg/dL — ABNORMAL HIGH (ref 70–99)

## 2010-04-12 LAB — LIPASE, BLOOD
Lipase: 156 U/L — ABNORMAL HIGH (ref 11–59)
Lipase: 30 U/L (ref 11–59)
Lipase: 39 U/L (ref 11–59)

## 2010-04-12 LAB — GLUCOSE, SEROUS FLUID: Glucose, Fluid: 123 mg/dL

## 2010-04-12 LAB — PROCALCITONIN: Procalcitonin: 0.13 ng/mL

## 2010-04-12 LAB — LACTIC ACID, PLASMA: Lactic Acid, Venous: 2.8 mmol/L — ABNORMAL HIGH (ref 0.5–2.2)

## 2010-04-12 LAB — HEMOCCULT GUIAC POC 1CARD (OFFICE): Fecal Occult Bld: POSITIVE

## 2010-04-12 LAB — PROTEIN, BODY FLUID

## 2010-04-12 LAB — PHOSPHORUS
Phosphorus: 3 mg/dL (ref 2.3–4.6)
Phosphorus: 5 mg/dL — ABNORMAL HIGH (ref 2.3–4.6)
Phosphorus: 6.3 mg/dL — ABNORMAL HIGH (ref 2.3–4.6)

## 2010-04-12 LAB — STREP PNEUMONIAE URINARY ANTIGEN: Strep Pneumo Urinary Antigen: NEGATIVE

## 2010-04-12 LAB — CARDIAC PANEL(CRET KIN+CKTOT+MB+TROPI)
CK, MB: 0.9 ng/mL (ref 0.3–4.0)
CK, MB: 1 ng/mL (ref 0.3–4.0)
Relative Index: INVALID (ref 0.0–2.5)
Troponin I: 0.03 ng/mL (ref 0.00–0.06)

## 2010-04-12 LAB — SODIUM, URINE, RANDOM: Sodium, Ur: 32 mEq/L

## 2010-04-12 LAB — MRSA PCR SCREENING
MRSA by PCR: NEGATIVE
MRSA by PCR: NEGATIVE

## 2010-04-12 LAB — LIPID PANEL: Cholesterol: 117 mg/dL (ref 0–200)

## 2010-04-12 LAB — POCT CARDIAC MARKERS: Myoglobin, poc: 108 ng/mL (ref 12–200)

## 2010-04-15 LAB — URINALYSIS, MICROSCOPIC ONLY
Bilirubin Urine: NEGATIVE
Protein, ur: NEGATIVE mg/dL
Urobilinogen, UA: 0.2 mg/dL (ref 0.0–1.0)

## 2010-04-15 LAB — CBC
HCT: 33.5 % — ABNORMAL LOW (ref 39.0–52.0)
HCT: 34.2 % — ABNORMAL LOW (ref 39.0–52.0)
MCHC: 34.5 g/dL (ref 30.0–36.0)
MCHC: 34.5 g/dL (ref 30.0–36.0)
MCHC: 34.6 g/dL (ref 30.0–36.0)
MCHC: 35.3 g/dL (ref 30.0–36.0)
MCV: 93.5 fL (ref 78.0–100.0)
MCV: 94.3 fL (ref 78.0–100.0)
Platelets: 106 10*3/uL — ABNORMAL LOW (ref 150–400)
Platelets: 109 10*3/uL — ABNORMAL LOW (ref 150–400)
Platelets: 179 10*3/uL (ref 150–400)
Platelets: 246 10*3/uL (ref 150–400)
RBC: 3.5 MIL/uL — ABNORMAL LOW (ref 4.22–5.81)
RDW: 14.1 % (ref 11.5–15.5)
RDW: 14.3 % (ref 11.5–15.5)
RDW: 14.5 % (ref 11.5–15.5)
WBC: 14.8 10*3/uL — ABNORMAL HIGH (ref 4.0–10.5)
WBC: 22.2 10*3/uL — ABNORMAL HIGH (ref 4.0–10.5)

## 2010-04-15 LAB — URINE CULTURE
Colony Count: NO GROWTH
Culture: NO GROWTH

## 2010-04-15 LAB — BASIC METABOLIC PANEL
BUN: 18 mg/dL (ref 6–23)
BUN: 22 mg/dL (ref 6–23)
CO2: 25 mEq/L (ref 19–32)
CO2: 26 mEq/L (ref 19–32)
CO2: 27 mEq/L (ref 19–32)
Calcium: 8 mg/dL — ABNORMAL LOW (ref 8.4–10.5)
Calcium: 8.1 mg/dL — ABNORMAL LOW (ref 8.4–10.5)
Chloride: 106 mEq/L (ref 96–112)
Chloride: 97 mEq/L (ref 96–112)
Creatinine, Ser: 1.03 mg/dL (ref 0.4–1.5)
Creatinine, Ser: 1.13 mg/dL (ref 0.4–1.5)
GFR calc Af Amer: >60 mL/min (ref >60)
Glucose, Bld: 136 mg/dL — ABNORMAL HIGH (ref 70–99)
Potassium: 4.5 mEq/L (ref 3.5–5.1)

## 2010-04-15 LAB — PROTIME-INR
INR: 2.32 — ABNORMAL HIGH (ref 0.00–1.49)
INR: 2.94 — ABNORMAL HIGH (ref 0.00–1.49)
INR: 3.01 — ABNORMAL HIGH (ref 0.00–1.49)
Prothrombin Time: 17.6 seconds — ABNORMAL HIGH (ref 11.6–15.2)
Prothrombin Time: 26.9 seconds — ABNORMAL HIGH (ref 11.6–15.2)
Prothrombin Time: 31 seconds — ABNORMAL HIGH (ref 11.6–15.2)

## 2010-04-16 LAB — CBC
HCT: 39.5 % (ref 39.0–52.0)
HCT: 45.8 % (ref 39.0–52.0)
HCT: 46.2 % (ref 39.0–52.0)
Hemoglobin: 13.7 g/dL (ref 13.0–17.0)
Hemoglobin: 15.7 g/dL (ref 13.0–17.0)
Hemoglobin: 15.8 g/dL (ref 13.0–17.0)
MCHC: 34.3 g/dL (ref 30.0–36.0)
MCHC: 34.7 g/dL (ref 30.0–36.0)
MCHC: 34.8 g/dL (ref 30.0–36.0)
MCHC: 34.8 g/dL (ref 30.0–36.0)
MCV: 92.7 fL (ref 78.0–100.0)
MCV: 94.3 fL (ref 78.0–100.0)
Platelets: 116 10*3/uL — ABNORMAL LOW (ref 150–400)
Platelets: 142 10*3/uL — ABNORMAL LOW (ref 150–400)
Platelets: 171 10*3/uL (ref 150–400)
RBC: 4.82 MIL/uL (ref 4.22–5.81)
RBC: 4.86 MIL/uL (ref 4.22–5.81)
RBC: 4.91 MIL/uL (ref 4.22–5.81)
RDW: 13.6 % (ref 11.5–15.5)
RDW: 13.7 % (ref 11.5–15.5)
RDW: 13.8 % (ref 11.5–15.5)
RDW: 13.8 % (ref 11.5–15.5)
RDW: 13.9 % (ref 11.5–15.5)
WBC: 16.1 10*3/uL — ABNORMAL HIGH (ref 4.0–10.5)

## 2010-04-16 LAB — APTT
aPTT: 33 seconds (ref 24–37)
aPTT: 45 seconds — ABNORMAL HIGH (ref 24–37)

## 2010-04-16 LAB — POCT I-STAT 3, ART BLOOD GAS (G3+)
Acid-Base Excess: 2 mmol/L (ref 0.0–2.0)
Acid-base deficit: 1 mmol/L (ref 0.0–2.0)
Acid-base deficit: 2 mmol/L (ref 0.0–2.0)
Acid-base deficit: 3 mmol/L — ABNORMAL HIGH (ref 0.0–2.0)
Acid-base deficit: 5 mmol/L — ABNORMAL HIGH (ref 0.0–2.0)
Acid-base deficit: 7 mmol/L — ABNORMAL HIGH (ref 0.0–2.0)
Bicarbonate: 20.2 mEq/L (ref 20.0–24.0)
Bicarbonate: 21.9 mEq/L (ref 20.0–24.0)
Bicarbonate: 28.1 mEq/L — ABNORMAL HIGH (ref 20.0–24.0)
O2 Saturation: 100 %
O2 Saturation: 92 %
O2 Saturation: 96 %
O2 Saturation: 98 %
Patient temperature: 35.6
TCO2: 21 mmol/L (ref 0–100)
TCO2: 30 mmol/L (ref 0–100)
pO2, Arterial: 372 mmHg — ABNORMAL HIGH (ref 80.0–100.0)
pO2, Arterial: 56 mmHg — ABNORMAL LOW (ref 80.0–100.0)
pO2, Arterial: 63 mmHg — ABNORMAL LOW (ref 80.0–100.0)
pO2, Arterial: 92 mmHg (ref 80.0–100.0)

## 2010-04-16 LAB — POCT I-STAT, CHEM 8
BUN: 11 mg/dL (ref 6–23)
Chloride: 112 mEq/L (ref 96–112)
Creatinine, Ser: 1 mg/dL (ref 0.4–1.5)
Glucose, Bld: 121 mg/dL — ABNORMAL HIGH (ref 70–99)
Potassium: 3.8 mEq/L (ref 3.5–5.1)
Sodium: 150 mEq/L — ABNORMAL HIGH (ref 135–145)

## 2010-04-16 LAB — COMPREHENSIVE METABOLIC PANEL
ALT: 55 U/L — ABNORMAL HIGH (ref 0–53)
AST: 58 U/L — ABNORMAL HIGH (ref 0–37)
Alkaline Phosphatase: 79 U/L (ref 39–117)
CO2: 21 mEq/L (ref 19–32)
Calcium: 9.3 mg/dL (ref 8.4–10.5)
GFR calc Af Amer: >60 mL/min (ref >60)
Potassium: 3.9 mEq/L (ref 3.5–5.1)
Sodium: 137 mEq/L (ref 135–145)
Total Protein: 7 g/dL (ref 6.0–8.3)

## 2010-04-16 LAB — POCT I-STAT 4, (NA,K, GLUC, HGB,HCT)
Glucose, Bld: 118 mg/dL — ABNORMAL HIGH (ref 70–99)
Glucose, Bld: 124 mg/dL — ABNORMAL HIGH (ref 70–99)
Glucose, Bld: 179 mg/dL — ABNORMAL HIGH (ref 70–99)
HCT: 31 % — ABNORMAL LOW (ref 39.0–52.0)
HCT: 43 % (ref 39.0–52.0)
Hemoglobin: 10.2 g/dL — ABNORMAL LOW (ref 13.0–17.0)
Hemoglobin: 12.9 g/dL — ABNORMAL LOW (ref 13.0–17.0)
Hemoglobin: 14.6 g/dL (ref 13.0–17.0)
Potassium: 4.1 mEq/L (ref 3.5–5.1)
Potassium: 4.2 mEq/L (ref 3.5–5.1)
Potassium: 4.4 mEq/L (ref 3.5–5.1)
Potassium: 5.6 mEq/L — ABNORMAL HIGH (ref 3.5–5.1)
Potassium: 7 mEq/L (ref 3.5–5.1)
Sodium: 132 mEq/L — ABNORMAL LOW (ref 135–145)
Sodium: 135 mEq/L (ref 135–145)
Sodium: 135 mEq/L (ref 135–145)
Sodium: 139 mEq/L (ref 135–145)
Sodium: 141 mEq/L (ref 135–145)
Sodium: 143 mEq/L (ref 135–145)

## 2010-04-16 LAB — PROTIME-INR
INR: 1.09 (ref 0.00–1.49)
INR: 1.14 (ref 0.00–1.49)
Prothrombin Time: 14 seconds (ref 11.6–15.2)
Prothrombin Time: 19.2 seconds — ABNORMAL HIGH (ref 11.6–15.2)

## 2010-04-16 LAB — PLATELET COUNT: Platelets: 115 10*3/uL — ABNORMAL LOW (ref 150–400)

## 2010-04-16 LAB — URINALYSIS, ROUTINE W REFLEX MICROSCOPIC
Leukocytes, UA: NEGATIVE
Nitrite: NEGATIVE
Specific Gravity, Urine: 1.014 (ref 1.005–1.030)
Urobilinogen, UA: 1 mg/dL (ref 0.0–1.0)
pH: 5.5 (ref 5.0–8.0)

## 2010-04-16 LAB — HEMOGLOBIN AND HEMATOCRIT, BLOOD: HCT: 33.5 % — ABNORMAL LOW (ref 39.0–52.0)

## 2010-04-16 LAB — GLUCOSE, CAPILLARY
Glucose-Capillary: 100 mg/dL — ABNORMAL HIGH (ref 70–99)
Glucose-Capillary: 122 mg/dL — ABNORMAL HIGH (ref 70–99)
Glucose-Capillary: 125 mg/dL — ABNORMAL HIGH (ref 70–99)
Glucose-Capillary: 125 mg/dL — ABNORMAL HIGH (ref 70–99)
Glucose-Capillary: 142 mg/dL — ABNORMAL HIGH (ref 70–99)
Glucose-Capillary: 160 mg/dL — ABNORMAL HIGH (ref 70–99)
Glucose-Capillary: 210 mg/dL — ABNORMAL HIGH (ref 70–99)
Glucose-Capillary: 94 mg/dL (ref 70–99)

## 2010-04-16 LAB — ABO/RH: ABO/RH(D): O POS

## 2010-04-16 LAB — BASIC METABOLIC PANEL
BUN: 11 mg/dL (ref 6–23)
BUN: 15 mg/dL (ref 6–23)
CO2: 21 mEq/L (ref 19–32)
Calcium: 7.9 mg/dL — ABNORMAL LOW (ref 8.4–10.5)
Chloride: 106 mEq/L (ref 96–112)
GFR calc non Af Amer: >60 mL/min (ref >60)
Glucose, Bld: 112 mg/dL — ABNORMAL HIGH (ref 70–99)
Glucose, Bld: 146 mg/dL — ABNORMAL HIGH (ref 70–99)
Potassium: 4.5 mEq/L (ref 3.5–5.1)
Potassium: 4.9 mEq/L (ref 3.5–5.1)
Sodium: 142 mEq/L (ref 135–145)

## 2010-04-16 LAB — POCT I-STAT 3, VENOUS BLOOD GAS (G3P V)
Acid-base deficit: 4 mmol/L — ABNORMAL HIGH (ref 0.0–2.0)
O2 Saturation: 60 %
O2 Saturation: 74 %
pCO2, Ven: 37.9 mmHg — ABNORMAL LOW (ref 45.0–50.0)
pCO2, Ven: 54.6 mmHg — ABNORMAL HIGH (ref 45.0–50.0)
pH, Ven: 7.395 — ABNORMAL HIGH (ref 7.250–7.300)
pO2, Ven: 31 mmHg (ref 30.0–45.0)

## 2010-04-16 LAB — BLOOD GAS, ARTERIAL
Bicarbonate: 22 mEq/L (ref 20.0–24.0)
Drawn by: 206361
FIO2: 0.21 %
O2 Saturation: 97 %
pH, Arterial: 7.455 — ABNORMAL HIGH (ref 7.350–7.450)
pO2, Arterial: 86 mmHg (ref 80.0–100.0)

## 2010-04-16 LAB — CREATININE, SERUM
Creatinine, Ser: 1.06 mg/dL (ref 0.4–1.5)
GFR calc Af Amer: >60 mL/min (ref >60)

## 2010-04-16 LAB — URINE MICROSCOPIC-ADD ON

## 2010-04-16 LAB — BUN: BUN: 9 mg/dL (ref 6–23)

## 2010-04-16 LAB — FIBRINOGEN: Fibrinogen: 207 mg/dL (ref 204–475)

## 2010-04-16 LAB — TYPE AND SCREEN

## 2010-04-17 ENCOUNTER — Telehealth: Payer: Self-pay | Admitting: *Deleted

## 2010-04-17 NOTE — Telephone Encounter (Signed)
Pt needs refill on creon, but wife states the dose has been changed to 2 twice a day.  Pt needs new script called to pharmacy with those instruction.  Uses [facility].

## 2010-04-17 NOTE — Medication Information (Signed)
Summary: rov/ewj  Anticoagulant Therapy  Managed by: Cloyde Reams, RN, BSN Referring MD: Antoine Poche PCP: Dr Roosvelt Harps MD: Mariah Milling Indication 1: Aortic Valve Replacement Indication 2: Atrial Fibrillation Valve Type: St Judes -- Mechanical INR POC 1.8 INR RANGE 2.0-3.0  Dietary changes: no    Health status changes: no    Bleeding/hemorrhagic complications: no    Recent/future hospitalizations: no    Any changes in medication regimen? yes       Details: Stopped the Coreg, taking OTC chlortrimeton. Currently on prednisone taper started 04/05/10  Recent/future dental: no  Any missed doses?: no       Is patient compliant with meds? yes      Comments: Pt did not increase dosage as directed at last OV, switched days taking his previous dosage, but pt was still only taking 10mg  weekly.    Allergies: 1)  ! * Primaxin 2)  ! Ace Inhibitors   Anticoagulation Management History:      The patient is taking warfarin and comes in today for a routine follow up visit.  Positive risk factors for bleeding include presence of serious comorbidities.  Negative risk factors for bleeding include an age less than 60 years old.  The bleeding index is 'intermediate risk'.  Positive CHADS2 values include History of HTN.  Negative CHADS2 values include Age > 92 years old.  His last INR was 6.6 ratio.  Anticoagulation responsible provider: Lashawna Poche.  INR POC: 1.8.  Cuvette Lot#: [id number].  Exp: 01/2011.    Anticoagulation Management Assessment/Plan:      The patient's current anticoagulation dose is Warfarin sodium 1 mg tabs: Use as directed by Anticoagualtion Clinic.  The target INR is 2.0-3.0.  The next INR is due 04/25/2010.  Anticoagulation instructions were given to patient.  Results were reviewed/authorized by Cloyde Reams, RN, BSN.  He was notified by Cloyde Reams RN.         Prior Anticoagulation Instructions: INR 1.8  Take 1 tablet today, then start taking 1 tablet daily except 1/2 tablet  on Mondays, Wednesdays, and Fridays.  Recheck in 2 weeks.    Current Anticoagulation Instructions: INR 1.8  Take 2 mg today, then start taking 2 mg daily except 1mg  on Mondays, Wednesdays, and Fridays.  Recheck in 2 weeks.

## 2010-04-17 NOTE — Telephone Encounter (Signed)
Pls have pt call whoever changed the dose for  refills.

## 2010-04-17 NOTE — Telephone Encounter (Signed)
Left message for patient to call back  

## 2010-04-18 ENCOUNTER — Telehealth: Payer: Self-pay | Admitting: *Deleted

## 2010-04-18 MED ORDER — PANCRELIPASE (LIP-PROT-AMYL) 6000-19000 UNITS PO CPEP: ORAL_CAPSULE | ORAL | Status: DC

## 2010-04-18 NOTE — Telephone Encounter (Signed)
Pt needs new script for creon sent to [facility] pharmacy.  He is taking two at breakfast, 2 at lunch and one at supper.  Insurance wont pay for script until the dosage is increased on the script.

## 2010-04-18 NOTE — Telephone Encounter (Signed)
Patient advised.

## 2010-04-18 NOTE — Telephone Encounter (Signed)
Patient notified as instructed by telephone. Was informed by patient that Dr. Para March has already taken care of this.

## 2010-04-18 NOTE — Telephone Encounter (Signed)
Please notify pt that I sent the rx.  He can inc to 2 po with each meal as needed- currently 2 po with breakfast and lunch and 1 at dinner.  Thanks.

## 2010-04-19 LAB — BASIC METABOLIC PANEL
BUN: 17 mg/dL (ref 6–23)
Calcium: 8.1 mg/dL — ABNORMAL LOW (ref 8.4–10.5)
Chloride: 101 mEq/L (ref 96–112)
Chloride: 97 mEq/L (ref 96–112)
Chloride: 98 mEq/L (ref 96–112)
Creatinine, Ser: 1.46 mg/dL (ref 0.4–1.5)
GFR calc Af Amer: >60 mL/min (ref >60)
GFR calc Af Amer: >60 mL/min (ref >60)
GFR calc Af Amer: >60 mL/min (ref >60)
GFR calc non Af Amer: 50 mL/min — ABNORMAL LOW (ref >60)
GFR calc non Af Amer: >60 mL/min (ref >60)
Potassium: 3.8 mEq/L (ref 3.5–5.1)
Potassium: 4 mEq/L (ref 3.5–5.1)
Potassium: 4.5 mEq/L (ref 3.5–5.1)
Sodium: 125 mEq/L — ABNORMAL LOW (ref 135–145)
Sodium: 130 mEq/L — ABNORMAL LOW (ref 135–145)
Sodium: 131 mEq/L — ABNORMAL LOW (ref 135–145)

## 2010-04-19 LAB — DIFFERENTIAL
Basophils Absolute: 0 10*3/uL (ref 0.0–0.1)
Basophils Absolute: 0 10*3/uL (ref 0.0–0.1)
Basophils Absolute: 0 10*3/uL (ref 0.0–0.1)
Basophils Absolute: 0 10*3/uL (ref 0.0–0.1)
Basophils Relative: 0 % (ref 0–1)
Basophils Relative: 0 % (ref 0–1)
Basophils Relative: 0 % (ref 0–1)
Basophils Relative: 0 % (ref 0–1)
Basophils Relative: 0 % (ref 0–1)
Basophils Relative: 0 % (ref 0–1)
Eosinophils Absolute: 0 10*3/uL (ref 0.0–0.7)
Eosinophils Absolute: 0.4 10*3/uL (ref 0.0–0.7)
Eosinophils Absolute: 0.4 10*3/uL (ref 0.0–0.7)
Eosinophils Absolute: 0.7 10*3/uL (ref 0.0–0.7)
Eosinophils Absolute: 1.1 10*3/uL — ABNORMAL HIGH (ref 0.0–0.7)
Eosinophils Absolute: 1.2 10*3/uL — ABNORMAL HIGH (ref 0.0–0.7)
Eosinophils Absolute: 1.2 10*3/uL — ABNORMAL HIGH (ref 0.0–0.7)
Eosinophils Relative: 0 % (ref 0–5)
Eosinophils Relative: 12 % — ABNORMAL HIGH (ref 0–5)
Eosinophils Relative: 2 % (ref 0–5)
Eosinophils Relative: 4 % (ref 0–5)
Eosinophils Relative: 6 % — ABNORMAL HIGH (ref 0–5)
Lymphocytes Relative: 10 % — ABNORMAL LOW (ref 12–46)
Lymphocytes Relative: 13 % (ref 12–46)
Lymphocytes Relative: 5 % — ABNORMAL LOW (ref 12–46)
Lymphocytes Relative: 6 % — ABNORMAL LOW (ref 12–46)
Lymphocytes Relative: 9 % — ABNORMAL LOW (ref 12–46)
Lymphs Abs: 0.8 10*3/uL (ref 0.7–4.0)
Lymphs Abs: 0.9 10*3/uL (ref 0.7–4.0)
Lymphs Abs: 1 10*3/uL (ref 0.7–4.0)
Lymphs Abs: 1 10*3/uL (ref 0.7–4.0)
Lymphs Abs: 1.3 10*3/uL (ref 0.7–4.0)
Monocytes Absolute: 0.6 10*3/uL (ref 0.1–1.0)
Monocytes Absolute: 1 10*3/uL (ref 0.1–1.0)
Monocytes Absolute: 1.1 10*3/uL — ABNORMAL HIGH (ref 0.1–1.0)
Monocytes Absolute: 1.1 10*3/uL — ABNORMAL HIGH (ref 0.1–1.0)
Monocytes Absolute: 1.3 10*3/uL — ABNORMAL HIGH (ref 0.1–1.0)
Monocytes Relative: 4 % (ref 3–12)
Monocytes Relative: 7 % (ref 3–12)
Monocytes Relative: 8 % (ref 3–12)
Monocytes Relative: 8 % (ref 3–12)
Monocytes Relative: 8 % (ref 3–12)
Monocytes Relative: 9 % (ref 3–12)
Monocytes Relative: 9 % (ref 3–12)
Neutro Abs: 11.1 10*3/uL — ABNORMAL HIGH (ref 1.7–7.7)
Neutro Abs: 14.8 10*3/uL — ABNORMAL HIGH (ref 1.7–7.7)
Neutro Abs: 15.3 10*3/uL — ABNORMAL HIGH (ref 1.7–7.7)
Neutrophils Relative %: 71 % (ref 43–77)
Neutrophils Relative %: 72 % (ref 43–77)
Neutrophils Relative %: 78 % — ABNORMAL HIGH (ref 43–77)
Neutrophils Relative %: 82 % — ABNORMAL HIGH (ref 43–77)
Neutrophils Relative %: 86 % — ABNORMAL HIGH (ref 43–77)

## 2010-04-19 LAB — COMPREHENSIVE METABOLIC PANEL
ALT: 120 U/L — ABNORMAL HIGH (ref 0–53)
ALT: 134 U/L — ABNORMAL HIGH (ref 0–53)
ALT: 136 U/L — ABNORMAL HIGH (ref 0–53)
ALT: 139 U/L — ABNORMAL HIGH (ref 0–53)
ALT: 170 U/L — ABNORMAL HIGH (ref 0–53)
ALT: 80 U/L — ABNORMAL HIGH (ref 0–53)
AST: 145 U/L — ABNORMAL HIGH (ref 0–37)
AST: 154 U/L — ABNORMAL HIGH (ref 0–37)
AST: 43 U/L — ABNORMAL HIGH (ref 0–37)
Albumin: 2.1 g/dL — ABNORMAL LOW (ref 3.5–5.2)
Albumin: 2.2 g/dL — ABNORMAL LOW (ref 3.5–5.2)
Albumin: 2.6 g/dL — ABNORMAL LOW (ref 3.5–5.2)
Alkaline Phosphatase: 101 U/L (ref 39–117)
Alkaline Phosphatase: 111 U/L (ref 39–117)
Alkaline Phosphatase: 137 U/L — ABNORMAL HIGH (ref 39–117)
Alkaline Phosphatase: 147 U/L — ABNORMAL HIGH (ref 39–117)
Alkaline Phosphatase: 154 U/L — ABNORMAL HIGH (ref 39–117)
BUN: 18 mg/dL (ref 6–23)
BUN: 8 mg/dL (ref 6–23)
CO2: 23 mEq/L (ref 19–32)
CO2: 28 mEq/L (ref 19–32)
CO2: 28 mEq/L (ref 19–32)
CO2: 29 mEq/L (ref 19–32)
Calcium: 8 mg/dL — ABNORMAL LOW (ref 8.4–10.5)
Calcium: 8.4 mg/dL (ref 8.4–10.5)
Chloride: 102 mEq/L (ref 96–112)
Chloride: 103 mEq/L (ref 96–112)
Chloride: 96 mEq/L (ref 96–112)
Chloride: 98 mEq/L (ref 96–112)
Chloride: 99 mEq/L (ref 96–112)
Chloride: 99 mEq/L (ref 96–112)
Creatinine, Ser: 1.01 mg/dL (ref 0.4–1.5)
Creatinine, Ser: 1.04 mg/dL (ref 0.4–1.5)
Creatinine, Ser: 1.08 mg/dL (ref 0.4–1.5)
Creatinine, Ser: 1.19 mg/dL (ref 0.4–1.5)
GFR calc Af Amer: >60 mL/min (ref >60)
GFR calc Af Amer: >60 mL/min (ref >60)
GFR calc Af Amer: >60 mL/min (ref >60)
GFR calc Af Amer: >60 mL/min (ref >60)
GFR calc non Af Amer: 58 mL/min — ABNORMAL LOW (ref >60)
GFR calc non Af Amer: >60 mL/min (ref >60)
GFR calc non Af Amer: >60 mL/min (ref >60)
GFR calc non Af Amer: >60 mL/min (ref >60)
GFR calc non Af Amer: >60 mL/min (ref >60)
GFR calc non Af Amer: >60 mL/min (ref >60)
Glucose, Bld: 122 mg/dL — ABNORMAL HIGH (ref 70–99)
Glucose, Bld: 147 mg/dL — ABNORMAL HIGH (ref 70–99)
Glucose, Bld: 150 mg/dL — ABNORMAL HIGH (ref 70–99)
Glucose, Bld: 173 mg/dL — ABNORMAL HIGH (ref 70–99)
Potassium: 3.6 mEq/L (ref 3.5–5.1)
Potassium: 4.1 mEq/L (ref 3.5–5.1)
Potassium: 4.1 mEq/L (ref 3.5–5.1)
Potassium: 4.4 mEq/L (ref 3.5–5.1)
Potassium: 4.5 mEq/L (ref 3.5–5.1)
Sodium: 132 mEq/L — ABNORMAL LOW (ref 135–145)
Sodium: 133 mEq/L — ABNORMAL LOW (ref 135–145)
Sodium: 136 mEq/L (ref 135–145)
Sodium: 136 mEq/L (ref 135–145)
Total Bilirubin: 0.7 mg/dL (ref 0.3–1.2)
Total Bilirubin: 0.8 mg/dL (ref 0.3–1.2)
Total Bilirubin: 0.8 mg/dL (ref 0.3–1.2)
Total Bilirubin: 0.9 mg/dL (ref 0.3–1.2)
Total Bilirubin: 1.1 mg/dL (ref 0.3–1.2)
Total Bilirubin: 1.3 mg/dL — ABNORMAL HIGH (ref 0.3–1.2)
Total Bilirubin: 1.3 mg/dL — ABNORMAL HIGH (ref 0.3–1.2)
Total Protein: 5.5 g/dL — ABNORMAL LOW (ref 6.0–8.3)
Total Protein: 5.6 g/dL — ABNORMAL LOW (ref 6.0–8.3)
Total Protein: 6 g/dL (ref 6.0–8.3)
Total Protein: 6.5 g/dL (ref 6.0–8.3)

## 2010-04-19 LAB — CBC
HCT: 25.8 % — ABNORMAL LOW (ref 39.0–52.0)
HCT: 26.5 % — ABNORMAL LOW (ref 39.0–52.0)
HCT: 26.9 % — ABNORMAL LOW (ref 39.0–52.0)
HCT: 26.9 % — ABNORMAL LOW (ref 39.0–52.0)
HCT: 31.4 % — ABNORMAL LOW (ref 39.0–52.0)
HCT: 31.8 % — ABNORMAL LOW (ref 39.0–52.0)
HCT: 34.2 % — ABNORMAL LOW (ref 39.0–52.0)
Hemoglobin: 10.6 g/dL — ABNORMAL LOW (ref 13.0–17.0)
Hemoglobin: 11.5 g/dL — ABNORMAL LOW (ref 13.0–17.0)
Hemoglobin: 7.9 g/dL — ABNORMAL LOW (ref 13.0–17.0)
Hemoglobin: 8.8 g/dL — ABNORMAL LOW (ref 13.0–17.0)
Hemoglobin: 8.9 g/dL — ABNORMAL LOW (ref 13.0–17.0)
Hemoglobin: 9.2 g/dL — ABNORMAL LOW (ref 13.0–17.0)
Hemoglobin: 9.5 g/dL — ABNORMAL LOW (ref 13.0–17.0)
Hemoglobin: 9.6 g/dL — ABNORMAL LOW (ref 13.0–17.0)
MCH: 29.8 pg (ref 26.0–34.0)
MCH: 30.1 pg (ref 26.0–34.0)
MCH: 30.1 pg (ref 26.0–34.0)
MCH: 30.8 pg (ref 26.0–34.0)
MCH: 31.1 pg (ref 26.0–34.0)
MCHC: 33.2 g/dL (ref 30.0–36.0)
MCHC: 33.6 g/dL (ref 30.0–36.0)
MCHC: 34.2 g/dL (ref 30.0–36.0)
MCV: 89.6 fL (ref 78.0–100.0)
MCV: 89.9 fL (ref 78.0–100.0)
MCV: 90.3 fL (ref 78.0–100.0)
MCV: 90.5 fL (ref 78.0–100.0)
MCV: 90.9 fL (ref 78.0–100.0)
Platelets: 240 10*3/uL (ref 150–400)
Platelets: 268 10*3/uL (ref 150–400)
Platelets: 300 10*3/uL (ref 150–400)
Platelets: 327 10*3/uL (ref 150–400)
Platelets: 337 10*3/uL (ref 150–400)
RBC: 2.55 MIL/uL — ABNORMAL LOW (ref 4.22–5.81)
RBC: 2.86 MIL/uL — ABNORMAL LOW (ref 4.22–5.81)
RBC: 2.95 MIL/uL — ABNORMAL LOW (ref 4.22–5.81)
RBC: 2.98 MIL/uL — ABNORMAL LOW (ref 4.22–5.81)
RBC: 3.01 MIL/uL — ABNORMAL LOW (ref 4.22–5.81)
RBC: 3.16 MIL/uL — ABNORMAL LOW (ref 4.22–5.81)
RBC: 3.16 MIL/uL — ABNORMAL LOW (ref 4.22–5.81)
RBC: 3.18 MIL/uL — ABNORMAL LOW (ref 4.22–5.81)
RBC: 3.5 MIL/uL — ABNORMAL LOW (ref 4.22–5.81)
RDW: 14.5 % (ref 11.5–15.5)
RDW: 14.9 % (ref 11.5–15.5)
RDW: 15.3 % (ref 11.5–15.5)
RDW: 15.3 % (ref 11.5–15.5)
WBC: 10.4 10*3/uL (ref 4.0–10.5)
WBC: 11.1 10*3/uL — ABNORMAL HIGH (ref 4.0–10.5)
WBC: 11.7 10*3/uL — ABNORMAL HIGH (ref 4.0–10.5)
WBC: 12.9 10*3/uL — ABNORMAL HIGH (ref 4.0–10.5)
WBC: 13.3 10*3/uL — ABNORMAL HIGH (ref 4.0–10.5)
WBC: 14.3 10*3/uL — ABNORMAL HIGH (ref 4.0–10.5)
WBC: 17.5 10*3/uL — ABNORMAL HIGH (ref 4.0–10.5)
WBC: 8.9 10*3/uL (ref 4.0–10.5)

## 2010-04-19 LAB — HEPATITIS PANEL, ACUTE
HCV Ab: NEGATIVE
Hep B C IgM: NEGATIVE
Hepatitis B Surface Ag: NEGATIVE

## 2010-04-19 LAB — HEMOCCULT GUIAC POC 1CARD (OFFICE): Fecal Occult Bld: POSITIVE

## 2010-04-19 LAB — CARDIAC PANEL(CRET KIN+CKTOT+MB+TROPI)
CK, MB: 0.8 ng/mL (ref 0.3–4.0)
Relative Index: INVALID (ref 0.0–2.5)
Total CK: 49 U/L (ref 7–232)
Total CK: 62 U/L (ref 7–232)
Troponin I: 0.02 ng/mL (ref 0.00–0.06)

## 2010-04-19 LAB — CULTURE, BLOOD (ROUTINE X 2)
Culture: NO GROWTH
Culture: NO GROWTH
Culture: NO GROWTH

## 2010-04-19 LAB — URINALYSIS, ROUTINE W REFLEX MICROSCOPIC
Glucose, UA: NEGATIVE mg/dL
Ketones, ur: NEGATIVE mg/dL
Leukocytes, UA: NEGATIVE
Protein, ur: NEGATIVE mg/dL
Urobilinogen, UA: 1 mg/dL (ref 0.0–1.0)

## 2010-04-19 LAB — PROTIME-INR
INR: 1.67 — ABNORMAL HIGH (ref 0.00–1.49)
INR: 2.5 — ABNORMAL HIGH (ref 0.00–1.49)
INR: 2.75 — ABNORMAL HIGH (ref 0.00–1.49)
INR: 3.88 — ABNORMAL HIGH (ref 0.00–1.49)
Prothrombin Time: 18.1 seconds — ABNORMAL HIGH (ref 11.6–15.2)
Prothrombin Time: 18.8 seconds — ABNORMAL HIGH (ref 11.6–15.2)
Prothrombin Time: 21.2 seconds — ABNORMAL HIGH (ref 11.6–15.2)
Prothrombin Time: 28.9 seconds — ABNORMAL HIGH (ref 11.6–15.2)

## 2010-04-19 LAB — SEDIMENTATION RATE: Sed Rate: 100 mm/hr — ABNORMAL HIGH (ref 0–16)

## 2010-04-19 LAB — URINE MICROSCOPIC-ADD ON

## 2010-04-19 LAB — C-REACTIVE PROTEIN: CRP: 35.7 mg/dL — ABNORMAL HIGH (ref <0.6)

## 2010-04-19 LAB — CROSSMATCH: ABO/RH(D): O POS

## 2010-04-19 LAB — CK TOTAL AND CKMB (NOT AT ARMC): CK, MB: 0.9 ng/mL (ref 0.3–4.0)

## 2010-04-19 LAB — BRAIN NATRIURETIC PEPTIDE: Pro B Natriuretic peptide (BNP): 236 pg/mL — ABNORMAL HIGH (ref 0.0–100.0)

## 2010-04-19 LAB — LIPASE, BLOOD: Lipase: 374 U/L — ABNORMAL HIGH (ref 11–59)

## 2010-04-19 LAB — OSMOLALITY, URINE: Osmolality, Ur: 646 mOsm/kg (ref 390–1090)

## 2010-04-19 LAB — TROPONIN I: Troponin I: 0.02 ng/mL (ref 0.00–0.06)

## 2010-04-20 ENCOUNTER — Encounter: Payer: Self-pay | Admitting: Cardiology

## 2010-04-20 DIAGNOSIS — I359 Nonrheumatic aortic valve disorder, unspecified: Secondary | ICD-10-CM

## 2010-04-20 DIAGNOSIS — Z954 Presence of other heart-valve replacement: Secondary | ICD-10-CM

## 2010-04-20 DIAGNOSIS — Z7901 Long term (current) use of anticoagulants: Secondary | ICD-10-CM

## 2010-04-20 DIAGNOSIS — I4891 Unspecified atrial fibrillation: Secondary | ICD-10-CM

## 2010-04-23 ENCOUNTER — Telehealth: Payer: Self-pay | Admitting: Internal Medicine

## 2010-04-23 MED ORDER — TRAMADOL HCL 50 MG PO TABS: ORAL_TABLET | ORAL | Status: DC

## 2010-04-23 NOTE — Telephone Encounter (Signed)
Ok to refill, be sure keeps appt with all active meds in hand

## 2010-04-23 NOTE — Telephone Encounter (Signed)
Dr. Sherene Sires please advise if okay for pt to have refill on tramadol. Pt has an upcoming apt 05/13/10. Thanks  Carver Fila, CMA

## 2010-04-23 NOTE — Telephone Encounter (Signed)
Spoke with [place] Mole pt wife and advised them rx was sent to pharmacy and pt needs to bring all meds with him to his apt in April. She verbalized understanding. Called pharmacy as well adn the states they did receive rx  Carver Fila, CMA

## 2010-04-25 ENCOUNTER — Encounter: Payer: BC Managed Care – PPO | Admitting: Emergency Medicine

## 2010-04-25 ENCOUNTER — Ambulatory Visit (INDEPENDENT_AMBULATORY_CARE_PROVIDER_SITE_OTHER): Payer: BC Managed Care – PPO | Admitting: Emergency Medicine

## 2010-04-25 DIAGNOSIS — I359 Nonrheumatic aortic valve disorder, unspecified: Secondary | ICD-10-CM

## 2010-04-25 DIAGNOSIS — Z954 Presence of other heart-valve replacement: Secondary | ICD-10-CM

## 2010-04-25 DIAGNOSIS — I4891 Unspecified atrial fibrillation: Secondary | ICD-10-CM

## 2010-04-25 DIAGNOSIS — Z7901 Long term (current) use of anticoagulants: Secondary | ICD-10-CM

## 2010-04-25 NOTE — Patient Instructions (Signed)
Start taking 2mg  daily except 1mg  on Mondays.  Recheck in 2 weeks.

## 2010-04-30 ENCOUNTER — Encounter: Payer: Self-pay | Admitting: Internal Medicine

## 2010-05-03 ENCOUNTER — Encounter: Payer: Self-pay | Admitting: Internal Medicine

## 2010-05-03 ENCOUNTER — Ambulatory Visit (INDEPENDENT_AMBULATORY_CARE_PROVIDER_SITE_OTHER): Payer: BC Managed Care – PPO | Admitting: Internal Medicine

## 2010-05-03 VITALS — BP 120/74 | HR 60 | Temp 97.6°F | Ht 70.0 in | Wt 202.0 lb

## 2010-05-03 DIAGNOSIS — R05 Cough: Secondary | ICD-10-CM

## 2010-05-03 DIAGNOSIS — I10 Essential (primary) hypertension: Secondary | ICD-10-CM

## 2010-05-03 MED ORDER — FAMOTIDINE 20 MG PO TABS: ORAL_TABLET | ORAL | Status: DC

## 2010-05-03 MED ORDER — PREDNISONE (PAK) 10 MG PO TABS: ORAL_TABLET | ORAL | Status: AC

## 2010-05-03 MED ORDER — TRAMADOL HCL 50 MG PO TABS: ORAL_TABLET | ORAL | Status: DC

## 2010-05-03 MED ORDER — PANTOPRAZOLE SODIUM 40 MG PO TBEC: DELAYED_RELEASE_TABLET | ORAL | Status: DC

## 2010-05-03 NOTE — Patient Instructions (Addendum)
Please see patient coordinator before you leave today  to schedule sinus ct  1) Continue flonase to twice daily and if nose still drips try chlortrimeton 4mg  one every 6 hours to dry it up  2) stay off Coreg  3) Bystolic 5 mg daily (samples) 4) Prednisone 4 each am x 2days, 2x2days, 1x2days and stop  5) pantoprazole to Take one 30-60 min before first and last meals of the day  And Pepcid 20 mg one at bedtime 6) Take delsym two tsp every 12 hours and add tramadol 50 mg up to 2 every 4 hours to suppress the urge to cough. Swallowing water or using ice chips/non mint and menthol containing candies (such as lifesavers or sugarless jolly ranchers) are also effective.  GERD (REFLUX)  is an extremely common cause of respiratory symptoms, many times with no significant heartburn at all.    It can be treated with medication, but also with lifestyle changes including avoidance of late meals, excessive alcohol, smoking cessation, and avoid fatty foods, chocolate, peppermint, colas, red wine, and acidic juices such as orange juice.  NO MINT OR MENTHOL PRODUCTS SO NO COUGH DROPS  USE SUGARLESS CANDY INSTEAD (jolley ranchers or Stover's)  NO OIL BASED VITAMINS   Please schedule a follow up office visit in 4 weeks, sooner if needed with all medications in hand

## 2010-05-03 NOTE — Assessment & Plan Note (Addendum)
This is most c/w  Classic Upper airway cough syndrome, so named because it's frequently impossible to sort out how much is  CR/sinusitis with freq throat clearing (which can be related to primary GERD)   vs  causing  secondary (" extra esophageal")  GERD from wide swings in gastric pressure that occur with throat clearing, often  promoting self use of mint and menthol lozenges that reduce the lower esophageal sphincter tone and exacerbate the problem further in a cyclical fashion.   These are the same pts who not infrequently have failed to tolerate ace inhibitors,  dry powder inhalers or biphosphonates or report having reflux symptoms that don't respond to standard doses of PPI , and are easily confused as having aecopd or asthma flares.  Keeping in mind however:  Chronic cough is often simultaneously caused by more than one condition. A single cause has been found from 38 to 82% of the time, multiple causes from 18 to 62%. Multiply caused cough has been the result of three diseases up to 42% of the time.       Next step is sinus ct

## 2010-05-03 NOTE — Assessment & Plan Note (Signed)
Until we sort through ddx for cough rec: Bystolic, the most beta -1  selective Beta blocker available in sample form, with bisoprolol the most selective generic choice  on the market.

## 2010-05-03 NOTE — Progress Notes (Signed)
Subjective:    Patient ID: [name] [name], male    DOB: 05-16-1950, 60 y.o.   MRN: [id number]  HPI    56 yowm never smoker with chronic cough ever since AVR June 2011 at [facility]   March 07, 2010 1st pulmonary office eval cc daily mostly dry hacky cough waxes and wanes more in afternoon and evening only better after cough syrup assoc with new sensation of pnds x a year so with sensation of choking gagging and h/o dysphagia with choking on food in past but not since cough taking ppi one daily p breakfast since last GI eval ? name of doctor required previous dilation 2003 by [name] and also since then ? doctor ? date.  rec Prednisone 4 each am x 2days, 2x2days, 1x2days and stop  Change pantoprazole to Take one 30-60 min before first and last meals of the day  Take delsym two tsp every 12 hours and add tramadol 50 mg up to every 4 hours to suppress the urge to cough. Swallowing water or using ice chips/non mint and menthol containing candies (such as lifesavers or sugarless jolly ranchers) are also effective.  GERD (REFLUX) diet   April 05, 2010 ov Cough- little better, sob w/ activity--pt starts rehab next week. did not control cough with tramadol effectively. thinks flonase helping pnds, no change doe, no sign excess mucus production or purulent/ bloody secretions.  1) Increase flonase to twice daily and if nose still drips try chlortrimeton 4mg  one every 6 hours to dry it up  2) Stop Coreg  3) Start Bystolic 5 mg daily  4) Prednisone 4 each am x 2days, 2x2days, 1x2days and stop  5) pantoprazole to Take one 30-60 min before first and last meals of the day  6) Take delsym two tsp every 12 hours and add tramadol 50 mg up to 2 every 4 hours to suppress the urge to cough. Swallowing water or using ice chips/non mint and menthol containing candies (such as lifesavers or sugarless jolly ranchers) are also effective.  7) GERD (REFLUX) diet.   05/03/2010 ov cough only better while on tramadol not taking  delsym.  Worse before supper with overt HB at hs sev times a weeek. Pt denies any significant sore throat, dysphagia, itching, sneezing,  nasal congestion or excess/ purulent secretions,  fever, chills, sweats, unintended wt loss, pleuritic or exertional cp, hempoptysis, orthopnea pnd or leg swelling.    Also denies any obvious fluctuation of symptoms with weather or environmental changes or other aggravating or alleviating factors.    Allergies  1) ! * Primaxin  2) ! Ace Inhibitors   Past History:  Aortic insuffficiency > AVR June 2011 [facility] gastroesophageal reflux disease  dyslipidemia  duodenal ulcer with GI bleeding  hypertension,  07/2009-Admisson for Pericardial effusion. Status post pericardial window  Acute pancreatitis with pancreatic necrosis.  08/2009- Admission for fever and syncope. No source for fever seen on cultures. Syncope thought to be related to orthostasis  09/2009 admission to [facility] with necrotizing pancreatitis and pseudocyst formation, also with ATN  10/2009 admission to [facility] with pneumonia and right pleural effusion status post thoracentesis  Cough onset June 2011........................................Marland KitchenWert       - Sinus CT ordered 05/03/2010 >>> GERD with stricture  - EGD with dilation 03/23/2001 ..........................Marland KitchenArlyce Dice      Review of Systems     Objective:   Physical Exam  wt 189 > 193 March 07, 2010 > 197 April 05, 2010 >  202 05/03/2010  HEENT: nl dentition, turbinates, and orophanx. Nl external ear canals without cough reflex  NECK : without JVD/Nodes/TM/ nl carotid upstrokes bilaterally  LUNGS: no acc muscle use, clear to A and P bilaterally without cough on insp or exp maneuvers  CV: RRR , mechanical S2 , no s3 or murmur or increase in P2, no edema  ABD: soft and nontender with nl excursion in the supine position. No bruits or organomegaly, bowel sounds nl  MS: warm without deformities, calf tenderness, cyanosis or clubbing  SKIN: warm and dry  without lesions       Assessment & Plan:  04/05/10 cxr Comparison: Chest 12/05/2009.  Findings: There is some scarring in the right lung base. Small  right pleural effusion seen on the prior study has nearly  completely resolved. Left lung remains clear. Postoperative  change of median sternotomy noted.  IMPRESSION:  Mild right basilar scar with a complete resolution of the small  right effusion. No new abnormality

## 2010-05-07 ENCOUNTER — Ambulatory Visit (INDEPENDENT_AMBULATORY_CARE_PROVIDER_SITE_OTHER)
Admission: RE | Admit: 2010-05-07 | Discharge: 2010-05-07 | Disposition: A | Discharge status: Home / Self Care | Payer: BC Managed Care – PPO | Source: Ambulatory Visit | Attending: Internal Medicine | Admitting: Internal Medicine

## 2010-05-07 DIAGNOSIS — R05 Cough: Secondary | ICD-10-CM

## 2010-05-09 ENCOUNTER — Ambulatory Visit (INDEPENDENT_AMBULATORY_CARE_PROVIDER_SITE_OTHER): Payer: BC Managed Care – PPO | Admitting: Emergency Medicine

## 2010-05-09 DIAGNOSIS — I359 Nonrheumatic aortic valve disorder, unspecified: Secondary | ICD-10-CM

## 2010-05-09 DIAGNOSIS — Z7901 Long term (current) use of anticoagulants: Secondary | ICD-10-CM

## 2010-05-09 DIAGNOSIS — I4891 Unspecified atrial fibrillation: Secondary | ICD-10-CM

## 2010-05-09 DIAGNOSIS — Z954 Presence of other heart-valve replacement: Secondary | ICD-10-CM

## 2010-05-09 LAB — BASIC METABOLIC PANEL
BUN: 13 mg/dL (ref 6–23)
CO2: 26 mEq/L (ref 19–32)
Chloride: 105 mEq/L (ref 96–112)
Creatinine, Ser: 1.05 mg/dL (ref 0.4–1.5)
Glucose, Bld: 88 mg/dL (ref 70–99)
Potassium: 4.3 mEq/L (ref 3.5–5.1)

## 2010-05-09 LAB — POCT INR: INR: 2.5

## 2010-05-14 ENCOUNTER — Telehealth: Payer: Self-pay | Admitting: Internal Medicine

## 2010-05-14 NOTE — Telephone Encounter (Signed)
Call patient : Study is unremarkable, no change in recs of CT PARANASAL SINUSES WITHOUT CONTRAST.  Spoke w/ pt anda dvised of results. Pt verbalized understanding and needed nothing further

## 2010-05-21 ENCOUNTER — Telehealth: Payer: Self-pay | Admitting: Internal Medicine

## 2010-05-21 NOTE — Telephone Encounter (Signed)
Called spoke with patient's wife, informed her that insurances do not cover PPIs bid and a PA will need to be done (request already received from pharmacy).  Advised pt can take OTC as alternative until PA is approved.  Pt wife would like to know what OTC recs MW has.  Pt wife aware MW not in office this afternoon but will return tomorrow morning, she is okay with this.

## 2010-05-22 NOTE — Telephone Encounter (Signed)
LMOM TCB x1 to inform pt of alternative as stated by MW.  Also, i have initiated PA thru [facility] and form is to be faxed to the triage fax number.

## 2010-05-22 NOTE — Telephone Encounter (Signed)
Need pt/ pharmacist to pick ppi not on PA list and in meantime take prilosec otc 20 mg in place of rx

## 2010-05-23 NOTE — Telephone Encounter (Signed)
lmomtcb x 2  

## 2010-05-23 NOTE — Telephone Encounter (Signed)
atc fast busy signal x3 WCB 

## 2010-05-23 NOTE — Telephone Encounter (Signed)
Pt's wife Fulton Mole returning call can be reached at [phone].Darletta Moll

## 2010-05-24 NOTE — Telephone Encounter (Signed)
Pt aware to use Prilosec 20 mg OTC until we hear from PA on Protonix.

## 2010-05-25 ENCOUNTER — Telehealth: Payer: Self-pay | Admitting: *Deleted

## 2010-05-25 NOTE — Telephone Encounter (Signed)
Left a detailed msg to let the pt know his insurance has APPROVED the Pantoprazole and that he can now fill this medication at the pharmacy.  [place] pharmacy has been notified.

## 2010-05-29 ENCOUNTER — Telehealth: Payer: Self-pay | Admitting: Internal Medicine

## 2010-05-29 MED ORDER — FLUTICASONE PROPIONATE 50 MCG/ACT NA SUSP: 2.0000 | Freq: Every day | NASAL | Status: DC

## 2010-05-29 NOTE — Telephone Encounter (Signed)
Refill needed for flonase. Pt's spouse aware rx sent to the pharmacy.

## 2010-05-29 NOTE — Telephone Encounter (Signed)
LMOMCTB

## 2010-05-30 ENCOUNTER — Ambulatory Visit (INDEPENDENT_AMBULATORY_CARE_PROVIDER_SITE_OTHER): Payer: BC Managed Care – PPO | Admitting: Emergency Medicine

## 2010-05-30 DIAGNOSIS — I4891 Unspecified atrial fibrillation: Secondary | ICD-10-CM

## 2010-05-30 DIAGNOSIS — Z954 Presence of other heart-valve replacement: Secondary | ICD-10-CM

## 2010-05-30 DIAGNOSIS — Z7901 Long term (current) use of anticoagulants: Secondary | ICD-10-CM

## 2010-05-30 DIAGNOSIS — I359 Nonrheumatic aortic valve disorder, unspecified: Secondary | ICD-10-CM

## 2010-06-05 ENCOUNTER — Telehealth: Payer: Self-pay | Admitting: Internal Medicine

## 2010-06-05 MED ORDER — TRAMADOL HCL 50 MG PO TABS: ORAL_TABLET | ORAL | Status: DC

## 2010-06-05 NOTE — Telephone Encounter (Signed)
Refill for Tramdol sent to [facility]. Left msg to let pt know to call the pharmacy.

## 2010-06-06 ENCOUNTER — Encounter: Payer: Self-pay | Admitting: Family Medicine

## 2010-06-07 ENCOUNTER — Encounter: Payer: Self-pay | Admitting: Family Medicine

## 2010-06-07 ENCOUNTER — Ambulatory Visit (INDEPENDENT_AMBULATORY_CARE_PROVIDER_SITE_OTHER): Payer: BC Managed Care – PPO | Admitting: Family Medicine

## 2010-06-07 VITALS — BP 122/74 | HR 84 | Temp 98.1°F | Wt 205.1 lb

## 2010-06-07 DIAGNOSIS — M25519 Pain in unspecified shoulder: Secondary | ICD-10-CM

## 2010-06-07 DIAGNOSIS — M25511 Pain in right shoulder: Secondary | ICD-10-CM | POA: Insufficient documentation

## 2010-06-07 MED ORDER — HYDROCODONE-ACETAMINOPHEN 5-325 MG PO TABS: 1.0000 | ORAL_TABLET | Freq: Four times a day (QID) | ORAL | Status: AC | PRN

## 2010-06-07 NOTE — Patient Instructions (Signed)
Use the exercise routine we talked about and take the hydrocodone for pain.  Call back if your pain isn't getting better.  No heavy lifting at rehab.

## 2010-06-07 NOTE — Assessment & Plan Note (Addendum)
Likely cuff strain/symtoms.  D/w pt re: anatomy, exercises and vicodin prn for pain.  Call back prn.  We can consider injection or ortho referral.  He understood.  Sedation caution on meds.

## 2010-06-07 NOTE — Progress Notes (Signed)
Shoulder pain.  R shoulder pain. Going on for a few days.  Mostly with night pain, pain laying on R side.  No trauma, no trigger known.  Pain reaching overhead.   R elbow not painful and neither is L shoulder.    Putting on weight.  Seeing pulm about cough.  In cardiac rehab, lifting light weights there.    Meds, vitals, and allergies reviewed.   ROS: See HPI.  Otherwise, noncontributory.  nad ncat Mmm rrr with SEM with click noted R shoulder with pain on ext/int rotation.  Pain with supraspinatus testing but no arm drop.  + impingement.  Distally nv intact.

## 2010-06-11 ENCOUNTER — Encounter: Payer: Self-pay | Admitting: Cardiology

## 2010-06-12 ENCOUNTER — Telehealth: Payer: Self-pay | Admitting: Internal Medicine

## 2010-06-12 NOTE — Consult Note (Signed)
NAME:  [name] [name], [name] [name] NO.:  [id number]   MEDICAL RECORD NO.:  [id number]          PATIENT TYPE:  INP   LOCATION:  1826                         FACILITY:  [facility]   PHYSICIAN:  Petra Kuba, M.D.    DATE OF BIRTH:  1950/05/24   DATE OF CONSULTATION:  06/10/2008  DATE OF DISCHARGE:                                 CONSULTATION   HISTORY OF PRESENT ILLNESS:  The patient with history of food impactions  in the past and esophageal dilatations, questionable last one about a  year ago without any previous problems; however, while eating steak  tonight got it lodged in his esophagus.  He tried to cough it up but was  unsuccessful and presented to the emergency room.  Prior to this  episode, he had been swallowing apparently well without problems.  His  past medical history is pertinent for previous endoscopies, but no  surgeries.  He has a heart valve as well as high blood pressure.   ALLERGIES:  None.   FAMILY HISTORY:  Pertinent for a brother with some similar issues.   SOCIAL HISTORY:  He does not smoke or drink.   MEDICATIONS:  Pertinent for questionable blood thinner and aspirin,  blood pressure medicine and diuretics.   REVIEW OF SYSTEMS:  Negative except as above.   PHYSICAL EXAMINATION:  GENERAL:  The patient obviously with food  impaction spitting into a cup, in no acute distress.  VITAL SIGNS:  Stable.  NECK:  Supple without adenopathy.  LUNGS:  Clear.  CARDIAC:  Regular rate and rhythm.  ABDOMEN:  Soft, but nontender.   ASSESSMENT:  Apparent food impaction.   PLAN:  The risks, benefits, methods of endoscopy were discussed.  We  will proceed ASAP with further workup and plans pending those findings.           ______________________________  Petra Kuba, M.D.     MEM/MEDQ  D:  06/11/2008  T:  06/11/2008  Job:  [id number]

## 2010-06-12 NOTE — Assessment & Plan Note (Signed)
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  He returned with pancreatitis with a pancreatic  phlegmon.  The CT scan of the abdomen included lower chest that showed a  large pericardial effusion that was about 4 cm wide posterolaterally.  He remained hemodynamically stable without signs of tamponade.  An  echocardiogram did show some indentation of the right atrium and the  patient had been having some orthopnea and heaviness in his chest at  home and therefore it was felt that we should drain this effusion.  He  underwent subxiphoid pericardial window on August 07, 2009.  His  postoperative course was uncomplicated and he was restarted on Coumadin.  His pancreatic phlegmon remained stable and he was discharged home.  Since discharge, he said that he has been feeling better.  He is  planning on starting cardiac rehab in the near future.  He denies any  chest pain or pressure.  He has had a couple episodes of brief rapid  heart rate.  He has been taking his blood thinner as directed and he has  been following up with the [facility].  He does have  some numbness in the outer portion of his left hand which sounds like a  brachial plexus stretch and also some numbness on the anterior portion  of left upper thigh.   PHYSICAL EXAMINATION:  His blood pressure is 129/85, pulse is 82 and  regular, and respiratory rate is 16, unlabored.  Oxygen saturation on  room air is 98%.  He looks well.  Cardiac exam shows regular rate and  rhythm with crisp mechanical valve click.  There is no murmur, rub, or  gallop.  His lungs are clear.  The chest incision is healing well and  the sternum is stable.  Abdominal exam  shows active bowel sounds.  His  abdomen is soft and nontender.   DIAGNOSTIC TESTS:  Followup chest x-ray shows clear lung fields and no  pleural effusions.   MEDICATIONS:  1. Coumadin as directed.  2. Losartan/HCTZ 100/12.5 mg daily.  3. Lasix 40 mg daily.  4. Potassium chloride 20 mEq daily.  5. Protonix 40 mg daily.  6. Iron daily.  7. Carvedilol 3.125 mg b.i.d.   IMPRESSION:  Overall, the patient appears to be making a good recovery  following his surgery.  He has no sign of recurrent pericardial effusion  with a normal-sized cardiac silhouette on chest x-ray.  I reassured him  that his left hand symptoms will most likely resolve over time.  I am  not sure why he has numbness in his left upper thigh, although I suspect  this is going to resolve over time.  He has a followup appointment later  this week with Dr. Luisa Hart concerning his pancreatitis.  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  He will  continue to follow up with  Dr. Antoine Poche for his cardiology care and will return to see me if he  develops any problems with his incisions.   Evelene Croon, M.D.  Electronically Signed   BB/MEDQ  D:  08/29/2009  T:  08/30/2009  Job:  [id number]   cc:   Rollene Rotunda, MD, [facility]

## 2010-06-12 NOTE — Assessment & Plan Note (Signed)
[facility]                            CARDIOLOGY OFFICE NOTE   NAME:Blalock, RODRIQUES BADIE                   MRN:          [id number]  DATE:11/06/2007                            DOB:          12/08/1950    PRIMARY CARE PHYSICIAN:  Arta Silence, MD   REASON FOR PRESENTATION:  Evaluate the patient with aortic insufficiency  and aortic root dilatation.   HISTORY OF PRESENT ILLNESS:  The patient is 60 years old.  He presents  for followup of the above.  Since I last saw him, he has had no new  symptoms.  In particular, he denies any shortness of breath.  He has had  no PND or orthopnea.  He has had no palpitation, presyncope, or syncope.  He denies any chest pain.  He has gained a little weight.  He still  walks his dogs, does his yard work without any significant limitations.   PAST MEDICAL HISTORY:  Aortic insufficiency with a bicuspid aortic valve  (the last MRI in August demonstrated the aortic root to be 4.8 x 4.7 cm.  There was moderate regurgitation.  He has normal chamber size with very  mild left ventricular dysfunction), gastroesophageal reflux disease,  dyslipidemia, duodenal ulcer with GI bleeding, hypertension, morbid  obesity.   ALLERGIES AND INTOLERANCES:  ACE INHIBITORS cause cough.   MEDICATIONS:  1. Hyzaar 100/12.5 daily.  2. Aspirin 81 mg daily.   REVIEW OF SYSTEMS:  As stated in the HPI and otherwise negative for  other systems.   PHYSICAL EXAMINATION:  GENERAL:  The patient is in no distress.  VITAL SIGNS:  Blood pressure 159/79, heart rate 90 and regular, weight  262 pounds, body mass index 38.  HEENT:  Eyes unremarkable.  Pupils equal, round and reactive to light.  Fundi not visualized.  Oral mucosa unremarkable.  NECK:  No jugular venous distention at 45 degrees.  Carotid upstroke  brisk and symmetrical.  No bruits.  No thyromegaly.  LYMPHATICS:  No  cervical, axillary, or inguinal adenopathy.  LUNGS:  Clear to  auscultation bilaterally.  BACK:  No costovertebral angle tenderness.  CHEST:  Unremarkable.  HEART:  PMI not displaced or sustained.  S1 and S2 within normal limits.  No S3, no S4.  No systolic murmurs, 3/6 diastolic murmur heard best at  the third and fourth left intercostal space.  ABDOMEN:  Obese, positive bowel sounds normal in frequency and pitch.  No bruits, no rebound, no guarding.  No midline pulsatile mass.  No  hepatomegaly.  No splenomegaly.  SKIN:  No rashes.  No nodules.  EXTREMITIES:  2+ pulses throughout.  No cyanosis, no clubbing, no edema.  NEUROLOGICAL:  Oriented to person, place and time.  Cranial nerves II  through XII grossly intact.  Motor grossly intact.   ASSESSMENT AND PLAN:  1. Aortic insufficiency.  The patient has moderate aortic      insufficiency.  He has had normal chamber sizes.  His root      dilatation has been stable at 48 mm.  At this point, I do plan a  repeat MRI in March, which will be one year from the last one.  He      will let me know if he has any change in his symptoms prior to      this.  2. Hypertension.  Blood pressure is controlled.  He will continue the      medications as listed.  3. Obesity.  We had a long discussion about this.  It is interesting      that his insurance is going to raise his self-pay portion by 10% if      he does not achieve a certain body mass index.  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  We discussed at great length weight Watchers.  He      is going to look into see if the insurance company will pay for      this as they should help him to achieve his goals (greater than      half hour discussing this).  Dyslipidemia, per Dr. Hetty Ely.  4. Gastrointestinal bleeding.  He has had no recent history of this.  5. Followup.  I will see him back in about 6 months or sooner based on      the results of the MRI.     Rollene Rotunda, MD,  [facility]  Electronically Signed    JH/MedQ  DD: 11/06/2007  DT: 11/07/2007  Job #: [id number]   cc:   Arta Silence, MD

## 2010-06-12 NOTE — Op Note (Signed)
NAME:  [name] [name], [name] [name] NO.:  [id number]   MEDICAL RECORD NO.:  [id number]          PATIENT TYPE:  INP   LOCATION:  1826                         FACILITY:  [facility]   PHYSICIAN:  Petra Kuba, M.D.    DATE OF BIRTH:  1950/08/09   DATE OF PROCEDURE:  DATE OF DISCHARGE:  06/11/2008                               OPERATIVE REPORT   PROCEDURE:  Esophagogastroduodenoscopy with food disimpaction.   INDICATION:  Probable food impaction.   Consent was signed after risks, benefits, methods, and options  thoroughly discussed prior to sedation.   MEDICINES USED:  1. Fentanyl 100 mcg.  2. Versed 10 mg.   PROCEDURE:  The videoendoscope was inserted by direct vision.  He did  have a moderate amount of fluid in his esophagus, majority of which were  suctioned.  He did have food in the GE junction.  We initially advanced  a [employer], small piece was removed.  Then as we were suctioning some  fluid, a piece was suctioned on to the head of the scope.  At that  point, he jerked the scope out and the piece was removed.  When the  scope was re-inserted, the remaining of the food had passed into the  esophagus.  Esophagus had some GE junction spasm, small hiatal hernia,  widely patent ring, and some inflammation at the GE junction.  Scope  passed into the stomach and some of the fluid was suctioned, some of the  food and fluid was still in the proximal stomach.  We went ahead and  advanced through a normal antrum, normal pylorus into the duodenal bulb,  a small shallow bulb ulcer was seen, we elected to withdraw, again back  in the stomach we tried to retroflex, but unfortunately with food and  fluid could not see the proximal stomach.  Air was suctioned.  Scope was  slowly withdrawn again, a good look at the esophagus confirmed the above  findings.  There was no more food in the esophagus, scope was removed.  The patient tolerated the procedure adequately, although at times became  combative and pulled the scope out as above.  There was no obvious  immediate complications.   ENDOSCOPIC DIAGNOSES:  1. Obvious food impaction status post removal with one time passing      the [employer] and one time suctioning and a piece against the scope      and having the scope being pulled out by the patient.  2. Small hiatal hernia with a widely patent ring.  3. Increased spasm at the gastroesophageal junction and some      inflammation from the food at the end of the procedure.  4. Proximal stomach not well seen due to food and fluid.  5. Small bulb ulcer.  6. Otherwise within normal limits.   PLAN:  Clear liquids for 24 hours, probably needs pump inhibitors long  term not only now to heal his esophagus and the ulcer, but to prevent  recurrent ulcers from the aspirin, happy to see him back p.r.n. if  swallowing problems continue for probable dilatation.  I  have discussed  all the above with the wife.           ______________________________  Petra Kuba, M.D.     MEM/MEDQ  D:  06/11/2008  T:  06/12/2008  Job:  [id number]   cc:   Arta Silence, MD

## 2010-06-12 NOTE — Consult Note (Signed)
NEW PATIENT CONSULTATION   [name] [name], [name] [name]  DOB:  [birth year]                                        July 04, 2009  CHART #:  [id number]   REASON FOR CONSULTATION:  Moderate aortic insufficiency with bicuspid  aortic valve and ascending aortic aneurysm.   CLINICAL HISTORY:  I was asked by Dr. Antoine Poche to evaluate the patient  for consideration of replacement of his aortic valve and ascending  aortic aneurysm.  He is a 60 year old gentleman with a known bicuspid  aortic valve with aortic insufficiency and ascending aortic aneurysm  that has been followed by Dr. Antoine Poche.  He has been having increasing  symptoms of decreased exertional tolerance.  He really only has symptoms  when he does significant strenuous exertion such as walking briskly  upstairs or while he was taking some fence post recently.  He has had  more fatigue late in the day.  He has also noticed some episodes of  dizziness, but has not had any presyncope or syncope.  He underwent a  cardiac MRA on Jun 22, 2009.  This showed severe left ventricular cavity  enlargement.  Ejection fraction was only mildly decreased and similar to  his previous study of May 16, 2008.  The cavity size is significantly  increased with the end-systolic dimension, now being 43 mm compared to  38 mm last year.  The end-diastolic dimension is 58 mm compared to 52 mm  last year.  There is an ascending aortic aneurysm with maximum diameter  of 4.8 x 4.6 cm at the level of the main pulmonary artery.  This is  stable compared to the previous study 1 year ago.  There is bicuspid  aortic valve with significant aortic insufficiency.  The regurgitant  fraction was 23%, but there was holodiastolic flow reversible and  premature closure of the anterior leaflet mitral valve.  It was also  noted the patient has an interrupted inferior vena cava, draining into a  large azygos vein.  The patient underwent cardiac catheterization on  June 29, 2009.  The interrupted inferior vena cava was also noted at time  of catheterization with the right heart catheter taking unusual route  traveling into the right atrium.  PA pressure was 21/5 with a mean of  14.  Wedge pressure was 11.  There was no coronary disease.  The right  coronary artery was a small nondominant vessel.  Left ventricular  ejection fraction was 50% with mild global hypokinesis.   REVIEW OF SYSTEMS:  His review of systems is as follows.  GENERAL:  He denies any fever or chills.  He has had no recent weight  changes.  He does report fatigue.  EYES:  Negative.  ENT:  Negative.  He does see dentist every 6 months as his teeth cleaned  and has had no dental problems.  ENDOCRINE:  He denies diabetes and hypothyroidism.  CARDIOVASCULAR:  He denies any chest pain or pressure.  He denies PND  and orthopnea.  He has had exertional dyspnea, but no dyspnea at rest.  Denies peripheral edema and palpitations.  RESPIRATORY:  Denies cough and sputum production.  GI:  Has had no nausea or vomiting.  Denies melena and bright red blood  per rectum.  GU:  He denies dysuria and hematuria.  MUSCULOSKELETAL:  He denies arthralgias  and myalgias.  NEUROLOGICAL:  He denies any focal weakness or numbness.  He has had a  few episodes of dizziness lately.  He denies any headaches.  He has had  no syncope.  He has never had TIA or stroke.  PSYCHIATRIC:  Negative.  HEMATOLOGICAL:  He denies any history of bleeding disorders or easy  bleeding.   ALLERGIES:  None.   MEDICATIONS:  1. Aspirin 81 mg daily.  2. Hyzaar 100/12.5 mg daily.  3. Amlodipine 5 mg daily.   PAST MEDICAL HISTORY:  Significant for hypertension.  He has history of  bicuspid aortic valve disease as mentioned above.  He has a history of  esophageal Schatzki ring with previous food impaction on several  occasions.  He has had no previous surgery.  He has history of duodenal  ulcer in the past with GI bleeding, but  no problems recently.  He has a  history of dyslipidemia.   FAMILY HISTORY:  Positive for bicuspid aortic valve disease.  His  brother had a recent diagnosis of bicuspid aortic valve and has not had  surgery.  His father had bicuspid aortic valve disease and underwent  aortic valve replacement in the past and subsequently died years later.  There is no family history of coronary disease.   SOCIAL HISTORY:  He is married and here with his second wife today.  He  has 2 children from previous marriage.  He works as a Merchandiser, retail for [employer].  He is a nonsmoker and denies alcohol or drug use.   PHYSICAL EXAMINATION:  Vital Signs:  Blood pressure 130/78, his pulse is  76 and regular, and respiratory rate is 18, unlabored.  Oxygen  saturation on room air is 97%.  His weight is 253 pounds with a height  of 71 inches.  General:  He is a large frame white male, in no distress.  HEENT:  Normocephalic and atraumatic.  Pupils are equal and reactive to  light and accommodation.  Extraocular muscles are intact.  Throat is  clear.  Teeth in good condition.  Neck:  Normal carotid pulses  bilaterally.  There are no bruits.  There is no adenopathy or  thyromegaly.  Cardiac:  Regular rate and rhythm with normal S1 and S2.  There is a grade 1-2/6 diastolic murmur at the apex.  Lungs:  Clear.  Abdomen:  Active bowel sounds.  His abdomen is soft, obese, and  nontender.  There are no palpable masses or organomegaly.  Extremities:  No peripheral edema.  Pedal pulses are palpable bilaterally.  Skin:  Warm and dry.  Neurologic:  Alert and oriented x3.  Motor and sensory  exams grossly normal.   IMPRESSION:  The patient has a bicuspid aortic valve with moderate  aortic insufficiency and evidence of left ventricular dilatation and  dysfunction.  He has worsening symptoms of exertional dyspnea.  I agree  that it is time to proceed with aortic valve replacement.  He has an  ascending aortic aneurysm and  therefore will require Bentall procedure.  I would recommend using a mechanical valve conduit given his age.  I  discussed the recommendation with he and his wife including the pros and  cons of mechanical and tissue valves.  We discussed the benefits and  risks of surgery including, but not limited to bleeding, blood  transfusion, infection, stroke, myocardial infarction, heart block  requiring permanent pacemaker, organ dysfunction, and death.  They  understand all this and are in agreement  to proceed with Bentall  procedure using mechanical valve conduit.  We will plan to schedule this  for Monday, July 10, 2009.   Evelene Croon, M.D.  Electronically Signed   BB/MEDQ  D:  07/04/2009  T:  07/05/2009  Job:  [id number]   cc:   Rollene Rotunda, MD, [facility]

## 2010-06-12 NOTE — Assessment & Plan Note (Signed)
OFFICE VISIT   [name] [name], [name] [name]  DOB:  1950/07/23                                        November 03, 2009  CHART #:  [id number]   HISTORY:  The patient returned to my office today for evaluation of a  recent echocardiogram dated October 26, 2009, which showed a moderate  pericardial effusion anterior to the right ventricle as well as over the  right atrium with right atrial chamber collapse.  Left ventricular  systolic ejection fraction was 35-40% with a hypokinesis of the  anterolateral myocardium.  There is hypokinesis of the apex.  There is  hypokinesis of the septum.  Wall thickness was increased with mild left  ventricular hypertrophy.  The cavity size was normal.  There is  respiratory flow variation across the tricuspid valve.  Comparison to  echocardiogram from September 12, 2009, showed this pericardial effusion  was larger.   The patient said that he has had recurrent admissions in September for  problems related to his severe necrotizing pancreatitis.  He has  subsequently formed a large pseudocyst, which measured 11 cm by recent  CT scan of the abdomen.  He has also had recurrent right pleural  effusion and has undergone thoracentesis for that.  He reports  persistent shortness of breath with exertion and orthopnea.  He has felt  generally weak all over and has had some edema in his legs.  His  appetite has been poor and his belly feels full all time.   PHYSICAL EXAMINATION:  His blood pressure 129/77, his pulse is 88 and  regular.  His respiratory rate is in the low 30s and he is taking short  breaths.  The oxygen saturation on room air is 99%.  He looks  chronically ill and weak.  Cardiac exam shows a regular rate and rhythm  with normal heart sounds.  Lung exam reveals decreased breath sounds  over the right lower lobe.  Abdominal exam shows active bowel sounds.  His abdomen is soft and protuberant.  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  This is nontender.  It extends up to the lower end of  the xiphoid process.  The sternotomy incision is well healed and the  sternum is stable.   MEDICATIONS:  His medications are as noted on his medication sheet and  were reviewed.  Of significance, he is on Coumadin daily for his  mechanical valve.   IMPRESSION:  The patient has a moderate pericardial effusion compressing  his right atrium and right ventricle, which may be causing him some mild  hemodynamic compromise.  This effusion is increasing by serial  echocardiogram.  He has also had problems with lower extremity edema and  recurrent right pleural effusion which may be related to this.  I think  that this pericardial effusion should be drained.  He has also had a  recurrent right pleural effusion, which should be drained by PleurX  catheter to try to prevent recurrence.  Since there is a ventral  incisional hernia in the epigastrium, we could repeat a subxiphoid  pericardial window and then repair his ventral hernia with synthetic  graft.  I discussed this with Dr. Harriette Bouillon.  Dr. Luisa Hart has been  following him for his pancreatitis and pancreatic  pseudocyst.  He would  be willing to repair the incisional hernia at the same time that we do  pericardial window.  If it also be possible to do a pericardial window  through a right video-assisted thoracoscopic surgery approach, but I am  a little more concerned about getting the pericardium off the atrium  after his previous surgery and think a subxiphoid approach may be safer.  I would plan to insert a right PleurX catheter.  We will tentatively  plan to admit the patient about 4 days prior to surgery for  discontinuation of Coumadin and a bridge with heparin.  Then, we can  hopefully plan on doing surgery on Monday, November 13, 2009.  I  discussed the operative procedure with he and his wife  including  alternatives, benefits, and risks including, but not limited to  bleeding, infection, injury to the heart, recurrence of the pericardial  effusion or pleural effusion, injury to the lung and pneumothorax.  They  understand all this and would like to proceed.  We will arrange  admission on Thursday, November 09, 2009, for heparin bridge and  anticipate surgery on November 13, 2009.   Evelene Croon, M.D.  Electronically Signed   BB/MEDQ  D:  11/03/2009  T:  11/04/2009  Job:  [id number]   cc:   Thomas A. Cornett, M.D.  Rollene Rotunda, MD, [facility]

## 2010-06-12 NOTE — Assessment & Plan Note (Signed)
[facility]                            CARDIOLOGY OFFICE NOTE   NAME:[name], [name] FLANNERY                   MRN:          [id number]  DATE:10/07/2006                            DOB:          09-Jul-1950    PRIMARY CARE PHYSICIAN:  [name] [name], M.D.   REASON FOR VISIT:  Evaluate patient with aortic insufficiency.   HISTORY OF PRESENT ILLNESS:  The patient returns for 21-month follow-up.  When I last saw him in March, he did have an echocardiogram which  demonstrated that his EF was still 55%.  He has not yet had left  ventricular dilatation.  His end diastolic dimension was 53 mm with an  end systolic dimension of 40 mm.  He has had questionable bicuspid  aortic valve.  This has not been clearly delineated on the  echocardiograms.  It has been thought possibly to be trileaflet on MRI  in the past.  Of concern, he did have significant enlargement of his  aortic root to 53 mm.  This was a significant increase compared with 44  mm measured in January of 2007.  Aortic regurgitation jet was eccentric  and not quantified.   The patient has had no new symptoms.  He has not been having any chest  discomfort or shortness of breath.  He has not been having any  palpitations, presyncope, or syncope.  He denies any PND or orthopnea.   PAST MEDICAL HISTORY:  Aortic insufficiency, gastroesophageal reflux  disease, hyperlipidemia, duodenal ulcer with GI bleeding, hypertension.   ALLERGIES:  ACE INHIBITOR caused cough.   MEDICATIONS:  1. Aspirin 81 mg daily.  2. Hydrochlorothiazide 12.5 mg daily.  3. Cozaar 50 mg daily.   REVIEW OF SYSTEMS:  As stated in the HPI and otherwise negative for  other systems.   PHYSICAL EXAMINATION:  GENERAL:  The patient is in no distress.  VITAL SIGNS:  Blood pressure 138/86, heart rate 71 and regular, weight  261 pounds, body mass index 37.  HEENT:  Eye lids unremarkable.  Pupils equal, round, and reactive to  light.   Fundi not visualized.  Oral mucosa unremarkable.  NECK:  No jugular venous distention.  Waveform within normal limits.  Carotid upstroke brisk and symmetric.  No bruits and no thyromegaly.  LYMPHATICS:  No cervical, axillary, or inguinal adenopathy.  LUNGS:  Clear to auscultation bilaterally.  BACK:  No costovertebral angle tenderness.  CHEST:  Unremarkable.  HEART:  PMI not displaced or sustained.  S1 and S2 within normal limits.  No S3, no S4.  2/6 apical diastolic murmur heard best at the fourth left  intercostal space, no systolic murmurs.  ABDOMEN:  Obese.  Positive bowel sounds.  Normal in frequency and pitch.  No bruits, no rebound, no guarding, no midline pulsatile mass, no  hepatomegaly and no splenomegaly.  SKIN:  No rashes, no nodules.  EXTREMITIES:  2+ pulses.  No edema.   EKG; sinus rhythm, rate 71, left axis deviation, left anterior  fascicular block, anterolateral T wave inversions, cannot exclude  ischemia versus repolarization changes.   ASSESSMENT:  1. Aortic insufficiency and  ascending aortic root dilatation.  The      patient has had significant progression of his aortic root      dilatation.  There is still a question of a bicuspid aortic valve.      It may well be approaching the need for valve replacement.  At this      point, I would want to further quantify and qualify this.  A      cardiac CT would be appropriate.  In anticipation of the need for      valve replacement, this would also allow [place] to image his coronary      arteries and potentially save [place] from requiring cardiac      catheterization.  The patient does have cardiovascular risk factors      and an abnormal EKG.  I will plan a cardiac CT for all of the      indications above.  2. Hypertension.  Blood pressure is well controlled and he will      continue medications as listed.   FOLLOWUP:  We will see the patient back based on the results of the  above testing.     Rollene Rotunda, MD, [facility]   Electronically Signed    JH/MedQ  DD: 10/07/2006  DT: 10/08/2006  Job #: [id number]   cc:   [name] Silence, MD

## 2010-06-12 NOTE — Telephone Encounter (Signed)
No samples available at this time. [id number] to see if they want rx sent in?Jennifer Yancey Flemings, CMA

## 2010-06-12 NOTE — Assessment & Plan Note (Signed)
OFFICE VISIT   [name] [name], [name] [name]  DOB:  1950-09-27                                        December 05, 2009  CHART #:  [id number]   HISTORY:  The patient returned to my office today for followup status  post subxiphoid pericardial window for drainage of a pericardial  effusion and insertion of right chest tube in conjunction with Dr.  Harriette Bouillon who repaired an epigastric ventral incisional hernia at  the lower end of his previous median sternotomy incision where he had a  previous subxiphoid pericardial window.  He said that he saw Dr. Luisa Hart  recently and the drain was removed.  He has been feeling better overall  and said that he is eating better.  He has mild incisional discomfort  with sleeping, but during the day, this does not bother him.  He is  still fairly weak, but is getting up, walking around, and trying to  increase his activity.  He said he is looking forward to start in  cardiac rehab when he is cleared to that.   PHYSICAL EXAMINATION:  Today, his blood pressure is 107/77, pulse is 77  and regular, and respiratory rate is 18, unlabored.  Oxygen saturation  on room air is 98%.  He looks well.  Cardiac exam shows regular rate and  rhythm with crisp mechanical valve click.  His lung exam is clear.  The  chest tube site is healing well.  The epigastric incision is healing  well.   DIAGNOSTIC TESTS:  Followup chest x-ray today shows marked improvement  in his bibasilar atelectasis.  There is a tiny right pleural effusion  remaining.  Lungs are otherwise clear.  The cardiac silhouette is  smaller than it was prior to drainage.   IMPRESSION:  Overall, the patient appears to be making a good recovery  at this point.  Symptomatically, he feels much better.  He has followup  appointment next week with Dr. Antoine Poche and will follow up with Dr.  Luisa Hart in a couple of weeks.  I told him he did not need to return to  see me unless he develops  more shortness of breath and recurrent pleural  effusion.  His  INR is being followed by the [facility] and it was  2.7 at the end of last week.   Evelene Croon, M.D.  Electronically Signed   BB/MEDQ  D:  12/05/2009  T:  12/05/2009  Job:  [id number]   cc:   Rollene Rotunda, MD, [facility]  Maisie Fus A. Cornett, M.D.

## 2010-06-12 NOTE — Assessment & Plan Note (Signed)
[facility]                            CARDIOLOGY OFFICE NOTE   NAME:[name] [name], [name] [name]                   MRN:          [id number]  DATE:04/08/2007                            DOB:          [birth year]    PRIMARY:  Dr. Hetty Ely.   REASON FOR PRESENTATION:  Evaluate the patient for aortic insufficiency  and aortic root dilatation.   HISTORY OF PRESENT ILLNESS:  The patient is a pleasant 60 year old  gentleman whom I have been following for the above.  His last study was  a CT scan done in September.  This demonstrated a bicuspid aortic valve.  He had an aortic root of 4.8-4.7 cm, though the radiology over-read of  this suggested it to be 5 cm.  We had had varying interpretations for  echocardiogram and MRI.  Regardless, the root dimension does seem to be  increasing from 44 to 49 over a 1-1/2 year period by echo.  He is  approaching the need for root and valve replacement.  There has only  been moderate aortic insufficiency documented with normal left  ventricular function and size.  Of note, he had a calcium score of 0  with his cardiac CT.   The patient returns for followup.  He has been quite active walking his  dogs.  They walk vigorously.  Does not have any chest discomfort, neck  or arm discomfort.  He denies any shortness breath, PND, or orthopnea.  He has no palpitations, presyncope, or syncope.   PAST MEDICAL HISTORY:  Aortic insufficiency with a bicuspid aortic  valve, descending aortic root dilatation, gastroesophageal reflux  disease, dyslipidemia, duodenal ulcer with GI bleeding, hypertension.   ALLERGIES:  Intolerances ACE INHIBITORS cause cough.   MEDICATIONS:  1. Aspirin 81 mg daily.  2. Hyzaar 50/12.5 daily.   REVIEW OF SYSTEMS:  As stated in HPI, otherwise have other systems.   PHYSICAL EXAMINATION:  The patient is in no distress.  Blood pressure 155/76, heart rate 70 regular, weight 255 pounds, body  mass index 37.  HEENT:   Eyelids unremarkable.  Pupils are equal, round, and reactive to  light and accommodation.  Fundi are not visualized.  Oral mucosa  unremarkable.  NECK:  No jugular venous distension at 45 degrees, carotid upstroke  brisk and symmetric, no bruits, thyromegaly.  LYMPHATICS:  No cervical, axillary, or inguinal adenopathy.  LUNGS:  Clear to auscultation bilaterally.  BACK:  No costovertebral angle tenderness.  CHEST:  Unremarkable.  HEART:  PMI not displaced or sustained, S1 and S2 within normal limits,  no S3, no S4, 2/6 apical diastolic murmur heard best at the fourth left  intercostal space.  No systolic murmurs.  ABDOMEN:  Obese, positive bowel sounds, normal in frequency and pitch,  no bruits, rebound, guarding.  No midline pulsatile mass, hepatomegaly,  splenomegaly.  SKIN:  No rashes, no nodules.  EXTREMITIES:  With 2+ pulses throughout, no edema, cyanosis, clubbing.  NEURO:  Oriented to person, place, and time, cranial nerves 2-12 grossly  intact, motor grossly intact.    EKG sinus rhythm, rate 65, left axis deviation, left anterior fascicular  block, poor anterior R wave progression, lateral T-wave changes,  unchanged from previous.   ASSESSMENT/PLAN:  1. Aortic root dilatation/bicuspid aortic valve.  This gentleman needs      to be followed closely given the increase in size of his aortic      root and the fact it is approaching 5 cm.  I am going to schedule      another MRI which was done in the past.  He did have some      claustrophobia with this, but if he is premedicated with Valium, I      think he will be able to tolerate this.  I think this is a better      alternative for imaging him for followup, rather than with repeated      CT or echo.  He and I discussed this at length.  2. Hypertension.  I am going to increase his Hyzaar to 100/12.5.  He      will let me know if he has any lightheadedness with this.  3. Obesity.  He understands the need to lose weight with  diet and      exercise.  4. Followup will be about 6 months or sooner based on the results the      above testing.     Rollene Rotunda, MD, [facility]  Electronically Signed    JH/MedQ  DD: 04/08/2007  DT: 04/09/2007  Job #: [id number]   cc:   Arta Silence, MD

## 2010-06-13 NOTE — Telephone Encounter (Signed)
lmtcb x2 

## 2010-06-14 NOTE — Telephone Encounter (Signed)
lmomtcb  

## 2010-06-15 ENCOUNTER — Encounter: Payer: Self-pay | Admitting: Internal Medicine

## 2010-06-15 ENCOUNTER — Ambulatory Visit (INDEPENDENT_AMBULATORY_CARE_PROVIDER_SITE_OTHER): Payer: BC Managed Care – PPO | Admitting: Internal Medicine

## 2010-06-15 DIAGNOSIS — R059 Cough, unspecified: Secondary | ICD-10-CM

## 2010-06-15 DIAGNOSIS — R05 Cough: Secondary | ICD-10-CM

## 2010-06-15 DIAGNOSIS — I10 Essential (primary) hypertension: Secondary | ICD-10-CM

## 2010-06-15 MED ORDER — NEBIVOLOL HCL 5 MG PO TABS: 5.0000 mg | ORAL_TABLET | Freq: Every day | ORAL | Status: DC

## 2010-06-15 NOTE — Assessment & Plan Note (Addendum)
Most c/w  classic Upper airway cough syndrome, so named because it's frequently impossible to sort out how much is  CR/sinusitis with freq throat clearing (which can be related to primary GERD)   vs  causing  secondary (" extra esophageal")  GERD from wide swings in gastric pressure that occur with throat clearing, often  promoting self use of mint and menthol lozenges that reduce the lower esophageal sphincter tone and exacerbate the problem further in a cyclical fashion.   These are the same pts who not infrequently have failed to tolerate ace inhibitors,  dry powder inhalers or biphosphonates or report having reflux symptoms that don't respond to standard doses of PPI , and are easily confused as having aecopd or asthma flares,   He has yet to comply with the first of 12 steps needed to work out the cause of his cough.  Will try adding H1 per guidelines now that we know sinus ct is ok.  I had an extended discussion with the patient and wife  today lasting 15 to 20 minutes of a 25 minute visit on the following issues:   NB  The standardized cough guidelines recently published in Chest by [place] in 2006  are a multiple step process (up to 12!) , not a single office visit,  and are intended  to address this problem logically,  with an alogrithm dependent on response to empiric treatment at  each progressive step  to determine a specific diagnosis with  minimal addtional testing needed. Therefore if compliance is an issue(as it clearly is here) or can't be accurately verified then it's very unlikely the standard evaluation and treatment will be successful    Furthermore, response to therapy (other than acute cough suppression, which should only be used short term with avoidance of narcotic containing cough syrups if possible), can be a gradual process for which the patient may not receive immediate benefit.  Unlike going to an eye doctor where the right rx is almost always the first one and is  immediately effective, this is almost never the case in the management of chronic cough syndromes and the patient needs to commit up front to compliance with recommendations and have the patience to wait out a response for up to 6 weeks of therapy directed at the likely underlying problem(s).

## 2010-06-15 NOTE — Patient Instructions (Addendum)
   1) Continue flonase to twice daily and add chlortrimeton 4mg  one after supper 2) stay off Coreg  3) Bystolic 5 mg daily (samples) 4) pantoprazole to Take one 30-60 min before first and last meals of the day  And Pepcid 20 mg one at bedtime 6) Take delsym two tsp every 12 hours and add tramadol 50 mg up to 2 every 4 hours to completely suppress the urge to cough. Swallowing water or using ice chips/non mint and menthol containing candies (such as lifesavers or sugarless jolly ranchers) are also effective.  GERD (REFLUX)  is an extremely common cause of respiratory symptoms, many times with no significant heartburn at all.    It can be treated with medication, but also with lifestyle changes including avoidance of late meals, excessive alcohol, smoking cessation, and avoid fatty foods, chocolate, peppermint, colas, red wine, and acidic juices such as orange juice.  NO MINT OR MENTHOL PRODUCTS SO NO COUGH DROPS  USE SUGARLESS CANDY INSTEAD (jolley ranchers or Stover's)  NO OIL BASED VITAMINS   Please schedule a follow up office visit in 4 weeks, sooner if needed with all medications in hand including over the counters.

## 2010-06-15 NOTE — Assessment & Plan Note (Signed)
[facility]                              CARDIOLOGY OFFICE NOTE   NAME:Molenda, DUSTYN DANSEREAU                   MRN:          [id number]  DATE:10/28/2005                            DOB:          1950-10-26    PRIMARY:  Arta Silence, MD.   REASON FOR PRESENTATION:  Patient with aortic insufficiency.   HISTORY OF PRESENT ILLNESS:  The patient is a pleasant 60 year old gentleman  who presents for follow up of his aortic insufficiency.  He has done well  since I last saw him.  He has had no new dyspnea and denies any  palpitations, presyncope or syncope.  He has had no PND or orthopnea.  He  denies any chest pressure, neck discomfort, arm discomfort, activity-induced  nausea, vomiting, excessive diaphoresis.   PAST MEDICAL HISTORY:  1. Aortic insufficiency with questionable bicuspid aortic valve,      moderately severe, mildly reduced ejection fraction, mild left      ventricular dilatation, mild ascending aortic dilatation.  2. Gastroesophageal reflux disease.  3. Mild hyperlipidemia.  4. Duodenal ulcer with gastrointestinal bleeding.   ALLERGIES:  ACE INHIBITOR CAUSES COUGH.   MEDICATIONS:  1. Aspirin 81 mg a day.  2. Cozaar 50 mg daily.   REVIEW OF SYSTEMS:  As stated in the HPI and otherwise negative for other  systems.   PHYSICAL EXAMINATION:  Patient is in no distress.  Blood pressure 141/78,  heart rate 73 and regular, weight __________ pounds, body mass index 32.  HEENT:  Eyes unremarkable, pupils equal, round, react to light, fundi not  visualized, oral mucosa unremarkable.  NECK:  No jugular venous distention, wave form within normal limits, carotid  upstroke brisk and symmetric, no bruits, no thyromegaly.  LYMPHATICS:  No cervical, axillary or inguinal adenopathy.  LUNGS:  Clear to auscultation bilaterally.  BACK:  No costovertebral angle tenderness.  CHEST:  Unremarkable.  HEART:  PMI not displaced or sustained, S1 and S2  within normal limits.  No  S3, no S4, a 2/6 apical diastolic murmur heard best at the fourth left  intercostal space with the patient leaning forward, no systolic murmurs.  ABDOMEN:  Obese, positive bowel sounds normal in frequency and pitch, no  bruits, no rebound, no guarding, no midline pulses or masses, no  cardiomegaly.  SKIN:  No rashes, no nodules.  EXTREMITIES:  Pulses 2+, no edema.  NEURO:  Oriented to person, place and time, cranial nerves II-XII grossly  intact, motor grossly intact.   EKG:  Sinus rhythm, rate __________ 69, left axis deviation, left anterior  fascicular block, anterolateral T wave inversions consistent with ischemia  but unchanged from previous EKGs.   ASSESSMENT AND PLAN:  1. Aortic insufficiency.  The patient has aortic insufficiency but does      not yet have any Class I or IIA indications for valve replacement.  At      this point I will continue to follow him and will schedule an      echocardiogram for either late January or early February.  I will      follow him  up after this.  We have discussed at great length symptoms      that could develop should he need to have this looked at sooner.  For      now, he will continue on the Cozaar as listed.  2. Hypertension.  Blood pressure is slightly elevated.  However, it has      been well controlled otherwise.  I will continue the Cozaar and he will      keep a blood pressure check at home.  3. Follow up.  I will see him back in February or sooner.            ______________________________  Rollene Rotunda, MD, [facility]     JH/MedQ  DD:  10/28/2005  DT:  10/29/2005  Job #:  [id number]   cc:   Arta Silence, MD

## 2010-06-15 NOTE — Assessment & Plan Note (Signed)
[facility]                            CARDIOLOGY OFFICE NOTE   NAME:[name] [name], [name] [name]                   MRN:          [id number]  DATE:04/08/2006                            DOB:          04/10/1950    PRIMARY CARE PHYSICIAN:  Arta Silence, M.D.   REASON FOR PRESENTATION:  Evaluate patient with aortic insufficiency.   HISTORY OF PRESENT ILLNESS:  The patient returns for a 35-month follow  up.  He has done well since I last saw him.  He has not had any new  cardiovascular symptoms.  He remains active at work and denies any chest  pressure, neck discomfort, arm discomfort, activity-induced nausea or  vomiting or excessive diaphoresis.  He has had no palpitations,  presyncope or syncope.  He has had no PND or orthopnea.  Unfortunately,  he has not been able to lose weight.   He did have an echocardiogram last week.  This demonstrated the  continued hypokinesis of his apex.  His EF appears to be 55%. He has not  had any significant left ventricular dilatation.  The ejection fraction  is unchanged from previous.  His aortic root is still dilated at 46 mm,  but unchanged from previous.  The aortic jet was not quantified.  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   PAST MEDICAL HISTORY:  1. Aortic insufficiency (previously moderately severe), mildly reduced      ejection fraction, mild to moderate ascending aortic root      dilatation.  2. Gastroesophageal reflux disease.  3. Hyperlipidemia.  4. Duodenal ulcer with gastrointestinal bleeding.  5. Hypertension.   ALLERGIES:  ACE INHIBITOR caused cough.   MEDICATIONS:  1. Aspirin 81 mg daily.  2. Cozaar 50 mg daily.   REVIEW OF SYSTEMS:  As stated in the HPI, and otherwise negative for  other systems.   PHYSICAL EXAMINATION:  GENERAL:  The patient is in no distress.  VITAL SIGNS:  Blood pressure 142/86, heart  rate 77 and regular, weight  262 pounds, body mass index 37.  HEENT:  Eyes unremarkable.  Pupils equal, round and reactive to light.  Fundi not visualized.  Oral mucosa unremarkable.  NECK:  No jugular venous distention at 45 degrees.  Carotid upstrokes  brisk and symmetric.  No bruits.  No thyromegaly.  LYMPHATICS:  No cervical, axillary or inguinal adenopathy.  LUNGS:  Clear to auscultation bilaterally.  BACK:  No costovertebral angle tenderness.  CHEST:  Unremarkable.  PMI not displaced or sustained.  S1 and S2 within  normal limits.  No S3, no S4.  A 2/6 apical diastolic murmur heard best  at the fourth left intercostal space with the patient leaning forward.  No systolic murmurs.  ABDOMEN:  Obese, positive bowel sounds normal in frequency and pitch.  No bruits, rebound or guarding.  No midline pulse.  No hepatomegaly or  splenomegaly.  SKIN:  No rashes.  No nodules.  EXTREMITIES:  There were 2+ pulses.  No cyanosis, no clubbing, no edema.  NEUROLOGIC:  Oriented to person, place, and time.  Cranial nerves II-XII  grossly intact.  Motor grossly intact.   ELECTROCARDIOGRAM:  Sinus rhythm, rate 75.  Left axis deviation, left  anterior fascicular block, premature atrial contraction, nonspecific  anterior T wave changes less pronounced than previous.   ASSESSMENT AND PLAN:  1. Aortic insufficiency.  The patient's aortic insufficiency, left      ventricular function, left ventricular size and aortic root      dilatation do not see to have changed.  I will review these films      again.  At this point, I think we can continue to follow this      clinically.  2. Hypertension.  His blood pressure continues to be elevated.  It was      at the last appointment, as well.  I am going to the liberty of      adding hydrochlorothiazide 12.5 mg daily to his regimen.  He is      going to get a BMET in about 10 days.  I would like to keep his      blood pressure in the 120/70 range with his  aortic root dilatation.  3. Follow up.  Will see the patient again in 6 months or sooner if      needed.     Rollene Rotunda, MD, [facility]  Electronically Signed    JH/MedQ  DD: 04/08/2006  DT: 04/10/2006  Job #: [id number]   cc:   Arta Silence, MD

## 2010-06-15 NOTE — Discharge Summary (Signed)
[place]  Patient:    [name] [name], [name] [name] Visit Number: [id number] MRN: [id number]          Service Type: MED Location: [phone] Attending Physician:  Duke Salvia Dictated by:   Rosalyn Gess. Norins, M.D. [facility] Admit Date:  01/09/2001 Disc. Date: 01/12/01   CC:         Dr. Laurita Quint, [facility], [place], [place]  Barbette Hair. [name], M.D. [facility]   Discharge Summary  ADMISSION DIAGNOSIS:  Gastrointestinal bleed.  DISCHARGE DIAGNOSIS:  Gastrointestinal bleed.  CONSULTANTS:  Wilhemina Bonito. Eda Keys., M.D., for GI.  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  Finding of a duodenal bulb ulcer, 6 mm in size, with active bleeding and oozing, that was injected with epinephrine.  The patient with a hiatal hernia.  CLOtest was positive for Helicobacter pylori.  HISTORY OF PRESENT ILLNESS:  This is a very pleasant 60 year old Caucasian gentleman with a history of 15 years ago of GI bleed, treated in [place], [place].  He also has a history of GERD with Schatzkis ring requiring dilatation in 1998 by Griffith Citron, M.D., and had follow-up with Barbette Hair. [name], M.D.  The patient presented to the [place], [place], office after having taken Correctol on Thursday and felt diaphoretic on Friday.  He had several loose bowel movements which started to look tarry after the second or third bowel movement.  In the office, he was 4+ heme-positive.  He had no shortness of breath, chest pain, or other discomfort.  PAST MEDICAL HISTORY:  This was significant for GI problems as noted. The patient had ___ exposure 15 years ago.  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  FAMILY HISTORY:  Significant for hypertension, diabetes, CAD.  SOCIAL HISTORY:  The patient does not smoke and does not drink. He is  married. He works full time.  ADMISSION PHYSICAL EXAMINATION:  VITAL SIGNS:  Blood pressure 148/79, heart rate regular.  ABDOMEN:  Negative.  NEUROLOGICAL:  Nonfocal.  HOSPITAL COURSE:  The patient was admitted with probable upper GI bleed.  His hemoglobins during the course of his hospitalization started off at 11.2 and dropped to a nadir of 7.4.  The patient was seen in consultation by the GI service who was responsible for the entire hospital course in regard to procedures and management.  The patient was taken for upper endoscopy as noted with treatment as noted.  The patient did require transfusion because of his low hemoglobin.  The patient remained asymptomatic and stable.  On the day of discharge, the patients hemoglobin was 9.3.  He was pain-free and doing well.  DISCHARGE EXAMINATION:  GENERAL APPEARANCE:  This is a well-nourished gentleman in no acute distress.  VITAL SIGNS:  The patient is afebrile at 97.8, blood pressure 139/62.  ABDOMEN:  Soft with no tenderness, no guarding, no rebound.  CHEST:  Clear.  FINAL LABORATORY:  Hemoglobin 9.3, hematocrit 27.1, white count 7100.  The patient had an Helicobacter pylori CLOtest that was positive.  DISPOSITION:  The patient is discharged to home.  DISCHARGE MEDICATIONS: 1. He is to be treated for his H. pylori with Biaxin 500 mg b.i.d. for 10    days. 2. Metronidazole 500 mg b.i.d. for 10 days. 3. Protonix 40 mg b.i.d. for 10 days then one tablet daily. indefinitely.  DISCHARGE INSTRUCTIONS:  The patient is instructed not to use any nonsteroidal anti-inflammatory drugs.  FOLLOW-UP:  He is set up to see Dr. [name] on January 31, 2001, at 11 a.m. for follow-up evaluation and probable scheduling of EGD with esophageal dilatation.  The patient is to follow up with Dr. Laurita Quint as needed.  CONDITION ON DISCHARGE:  Stable and improved. Dictated by:   Rosalyn Gess. Norins, M.D. [facility] Attending Physician:   Duke Salvia DD:  01/12/01 TD:  01/12/01 Job: [id number]

## 2010-06-15 NOTE — Progress Notes (Signed)
Subjective:    Patient ID: [name] [name], male    DOB: Nov 03, 1950, 60 y.o.   MRN: [id number]  HPI    58 yowm never smoker with chronic cough ever since AVR June 2011 at [facility]   March 07, 2010 1st pulmonary office eval cc daily mostly dry hacky cough waxes and wanes more in afternoon and evening only better after cough syrup assoc with new sensation of pnds x a year so with sensation of choking gagging and h/o dysphagia with choking on food in past but not since cough taking ppi one daily p breakfast since last GI eval ? name of doctor required previous dilation 2003 by [name] [name] and also since then ? doctor ? date.  rec Prednisone 4 each am x 2days, 2x2days, 1x2days and stop  Change pantoprazole to Take one 30-60 min before first and last meals of the day  Take delsym two tsp every 12 hours and add tramadol 50 mg up to every 4 hours to suppress the urge to cough. Swallowing water or using ice chips/non mint and menthol containing candies (such as lifesavers or sugarless jolly ranchers) are also effective.  GERD (REFLUX) diet   April 05, 2010 ov Cough- little better, sob w/ activity--pt starts rehab next week. did not control cough with tramadol effectively. thinks flonase helping pnds, no change doe, no sign excess mucus production or purulent/ bloody secretions.  1) Increase flonase to twice daily and if nose still drips try chlortrimeton 4mg  one every 6 hours to dry it up  2) Stop Coreg  3) Start Bystolic 5 mg daily  4) Prednisone 4 each am x 2days, 2x2days, 1x2days and stop  5) pantoprazole to Take one 30-60 min before first and last meals of the day  6) Take delsym two tsp every 12 hours and add tramadol 50 mg up to 2 every 4 hours to suppress the urge to cough. Swallowing water or using ice chips/non mint and menthol containing candies (such as lifesavers or sugarless jolly ranchers) are also effective.  7) GERD (REFLUX) diet.   05/03/2010 ov cough only better while on tramadol not  taking delsym.  Worse before supper with overt HB at hs sev times a weeek. 1) Continue flonase to twice daily and if nose still drips try chlortrimeton 4mg  one every 6 hours to dry it up  2) stay off Coreg  3) Bystolic 5 mg daily (samples) 4) Prednisone 4 each am x 2days, 2x2days, 1x2days and stop  5) pantoprazole to Take one 30-60 min before first and last meals of the day  And Pepcid 20 mg one at bedtime 6) Take delsym two tsp every 12 hours and add tramadol 50 mg up to 2 every 4 hours to suppress the urge to cough. Swallowing water or using ice chips/non mint and menthol containing candies (such as lifesavers or sugarless jolly ranchers) are also effective.  GERD (REFLUX)   06/15/2010  Ov /[name] [name] still clearing the throat and needing "cough med" didn't bring this or all of his meds as requested, no excess mucus or noct awakening.     Pt denies any significant sore throat, dysphagia, itching, sneezing,  nasal congestion or excess/ purulent secretions,  fever, chills, sweats, unintended wt loss, pleuritic or exertional cp, hempoptysis, orthopnea pnd or leg swelling.    Also denies any obvious fluctuation of symptoms with weather or environmental changes or other aggravating or alleviating factors.     Allergies  1) ! * Primaxin  2) !  Ace Inhibitors   Past History:  Aortic insuffficiency > AVR June 2011 [facility] gastroesophageal reflux disease  dyslipidemia  duodenal ulcer with GI bleeding  hypertension,  07/2009-[facility]miss[facility] for Pericardial effusion. Status post pericardial window  Acute pancreatitis with pancreatic necrosis.  08/2009- Admission for fever and syncope. No source for fever seen on cultures. 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 Nl external ear canals without cough reflex  NECK : without JVD/Nodes/TM/ nl carotid upstrokes bilaterally  LUNGS: no acc muscle use, clear to A and P bilaterally without cough on insp or exp maneuvers  CV: RRR , mechanical S2 , no s3 or murmur or increase in P2, no edema  ABD: soft and nontender with nl excursion in the supine position. No bruits or organomegaly, bowel sounds nl  MS: warm without deformities, calf tenderness, cyanosis or clubbing  SKIN: warm and dry without lesions     04/05/10 cxr Comparison: Chest 12/05/2009.  Findings: There is some scarring in the right lung base. Small  right pleural effusion seen on the prior study has nearly  completely resolved. Left lung remains clear. Postoperative  change of median sternotomy noted.  IMPRESSION:  Mild right basilar scar with a complete resolution of the small  right effusion. No new abnormality  Assessment & Plan:

## 2010-06-15 NOTE — Telephone Encounter (Signed)
lmomtcb  

## 2010-06-15 NOTE — Assessment & Plan Note (Signed)
Until we sort thru the ddx which includes asthma   Strongly prefer in this setting: Bystolic, the most beta -1  selective Beta blocker available in sample form, with bisoprolol the most selective generic choice  on the market.

## 2010-06-27 ENCOUNTER — Ambulatory Visit (INDEPENDENT_AMBULATORY_CARE_PROVIDER_SITE_OTHER): Payer: BC Managed Care – PPO | Admitting: Emergency Medicine

## 2010-06-27 ENCOUNTER — Other Ambulatory Visit (INDEPENDENT_AMBULATORY_CARE_PROVIDER_SITE_OTHER): Payer: Self-pay | Admitting: Surgery

## 2010-06-27 DIAGNOSIS — K863 Pseudocyst of pancreas: Secondary | ICD-10-CM

## 2010-06-27 DIAGNOSIS — Z954 Presence of other heart-valve replacement: Secondary | ICD-10-CM

## 2010-06-27 DIAGNOSIS — I4891 Unspecified atrial fibrillation: Secondary | ICD-10-CM

## 2010-06-27 DIAGNOSIS — I359 Nonrheumatic aortic valve disorder, unspecified: Secondary | ICD-10-CM

## 2010-06-27 DIAGNOSIS — Z7901 Long term (current) use of anticoagulants: Secondary | ICD-10-CM

## 2010-07-02 ENCOUNTER — Ambulatory Visit
Admission: RE | Admit: 2010-07-02 | Discharge: 2010-07-02 | Disposition: A | Discharge status: Home / Self Care | Payer: BC Managed Care – PPO | Source: Ambulatory Visit | Attending: Surgery | Admitting: Surgery

## 2010-07-02 ENCOUNTER — Other Ambulatory Visit: Payer: BC Managed Care – PPO

## 2010-07-02 ENCOUNTER — Other Ambulatory Visit (INDEPENDENT_AMBULATORY_CARE_PROVIDER_SITE_OTHER): Payer: Self-pay | Admitting: Surgery

## 2010-07-02 DIAGNOSIS — K862 Cyst of pancreas: Secondary | ICD-10-CM

## 2010-07-02 MED ORDER — IOHEXOL 350 MG/ML SOLN
100.0000 mL | Freq: Once | INTRAVENOUS | Status: AC | PRN
  Administered 2010-07-02: 100 mL via INTRAVENOUS

## 2010-07-05 ENCOUNTER — Encounter: Payer: Self-pay | Admitting: Cardiology

## 2010-07-05 ENCOUNTER — Other Ambulatory Visit: Payer: Self-pay

## 2010-07-05 MED ORDER — WARFARIN SODIUM 1 MG PO TABS: ORAL_TABLET | ORAL | Status: DC

## 2010-07-05 NOTE — Telephone Encounter (Signed)
error 

## 2010-07-09 ENCOUNTER — Telehealth: Payer: Self-pay | Admitting: Internal Medicine

## 2010-07-09 MED ORDER — TRAMADOL HCL 50 MG PO TABS: ORAL_TABLET | ORAL | Status: DC

## 2010-07-09 NOTE — Telephone Encounter (Signed)
Rx refill sent to pharm. LMOM for pt to be aware of this and to remind him to keep ov with MW on 07/18/10.

## 2010-07-09 NOTE — Telephone Encounter (Signed)
I responded to this once already

## 2010-07-09 NOTE — Telephone Encounter (Signed)
Spoke with pt's spouse. She states pt's cough is some better, but he is now out of tramadol. 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  Requesting refill on tramadol. Pt last seen by MW on 06/15/10. Will forward to doc of the day. Pls advise thanks!

## 2010-07-09 NOTE — Telephone Encounter (Signed)
[id number] to get pt's sx.  Pt was last given rx for Tramadol on 5/8 for # 40 x 0 refills.

## 2010-07-09 NOTE — Telephone Encounter (Signed)
Ok to refill #30 and then pt needs OV with MW to regroup

## 2010-07-09 NOTE — Telephone Encounter (Signed)
Pt's wife returning call can be reached at [phone].Raylene Everts

## 2010-07-11 ENCOUNTER — Ambulatory Visit (INDEPENDENT_AMBULATORY_CARE_PROVIDER_SITE_OTHER): Payer: BC Managed Care – PPO | Admitting: Emergency Medicine

## 2010-07-11 DIAGNOSIS — I359 Nonrheumatic aortic valve disorder, unspecified: Secondary | ICD-10-CM

## 2010-07-11 DIAGNOSIS — Z954 Presence of other heart-valve replacement: Secondary | ICD-10-CM

## 2010-07-11 DIAGNOSIS — I4891 Unspecified atrial fibrillation: Secondary | ICD-10-CM

## 2010-07-11 DIAGNOSIS — Z7901 Long term (current) use of anticoagulants: Secondary | ICD-10-CM

## 2010-07-11 LAB — POCT INR: INR: 2.8

## 2010-07-18 ENCOUNTER — Encounter: Payer: Self-pay | Admitting: Gastroenterology

## 2010-07-18 ENCOUNTER — Ambulatory Visit (INDEPENDENT_AMBULATORY_CARE_PROVIDER_SITE_OTHER): Payer: BC Managed Care – PPO | Admitting: Internal Medicine

## 2010-07-18 ENCOUNTER — Encounter: Payer: Self-pay | Admitting: Internal Medicine

## 2010-07-18 VITALS — BP 134/82 | HR 72 | Temp 98.1°F | Ht 70.0 in | Wt 207.0 lb

## 2010-07-18 DIAGNOSIS — R05 Cough: Secondary | ICD-10-CM

## 2010-07-18 DIAGNOSIS — Z954 Presence of other heart-valve replacement: Secondary | ICD-10-CM

## 2010-07-18 DIAGNOSIS — K219 Gastro-esophageal reflux disease without esophagitis: Secondary | ICD-10-CM

## 2010-07-18 NOTE — Progress Notes (Signed)
Addended by: Christen Butter on: 07/18/2010 09:14 AM   Modules accepted: Orders

## 2010-07-18 NOTE — Patient Instructions (Addendum)
Continue flonase to twice daily and add chlortrimeton 4mg  one after supper  Please see patient coordinator before you leave today  to schedule follow up with Dr [name] re difficulty swallowing  Please schedule a follow up office visit in 6 weeks, call sooner if needed with PFTs

## 2010-07-18 NOTE — Progress Notes (Signed)
Subjective:    Patient ID: [name] [name], male    DOB: May 20, 1950, 60 y.o.   MRN: [id number]  HPI    89 yowm never smoker with chronic cough ever since AVR June 2011 at [facility]   March 07, 2010 1st pulmonary office eval cc daily mostly dry hacky cough waxes and wanes more in afternoon and evening only better after cough syrup assoc with new sensation of pnds x a year so with sensation of choking gagging and h/o dysphagia with choking on food in past but not since cough taking ppi one daily p breakfast since last GI eval ? name of doctor required previous dilation 2003 by [name] and also since then ? doctor ? date.  rec Prednisone 4 each am x 2days, 2x2days, 1x2days and stop  Change pantoprazole to Take one 30-60 min before first and last meals of the day  Take delsym two tsp every 12 hours and add tramadol 50 mg up to every 4 hours to suppress the urge to cough. Swallowing water or using ice chips/non mint and menthol containing candies (such as lifesavers or sugarless jolly ranchers) are also effective.  GERD (REFLUX) diet   April 05, 2010 ov Cough- little better, sob w/ activity--pt starts rehab next week. did not control cough with tramadol effectively. thinks flonase helping pnds, no change doe, no sign excess mucus production or purulent/ bloody secretions.  1) Increase flonase to twice daily and if nose still drips try chlortrimeton 4mg  one every 6 hours to dry it up  2) Stop Coreg  3) Start Bystolic 5 mg daily  4) Prednisone 4 each am x 2days, 2x2days, 1x2days and stop  5) pantoprazole to Take one 30-60 min before first and last meals of the day  6) Take delsym two tsp every 12 hours and add tramadol 50 mg up to 2 every 4 hours to suppress the urge to cough. Swallowing water or using ice chips/non mint and menthol containing candies (such as lifesavers or sugarless jolly ranchers) are also effective.  7) GERD (REFLUX) diet.   05/03/2010 ov cough only better while on tramadol not  taking delsym.  Worse before supper with overt HB at hs sev times a weeek. 1) Continue flonase to twice daily and if nose still drips try chlortrimeton 4mg  one every 6 hours to dry it up  2) stay off Coreg  3) Bystolic 5 mg daily (samples) 4) Prednisone 4 each am x 2days, 2x2days, 1x2days and stop  5) pantoprazole to Take one 30-60 min before first and last meals of the day  And Pepcid 20 mg one at bedtime 6) Take delsym two tsp every 12 hours and add tramadol 50 mg up to 2 every 4 hours to suppress the urge to cough. Swallowing water or using ice chips/non mint and menthol containing candies (such as lifesavers or sugarless jolly ranchers) are also effective.  GERD (REFLUX)   07/18/2010  Ov /[name] [name] still clearing the throat and needing "cough med" didn't bring this or all of his meds as requested, no excess mucus or noct awakening.    1) Continue flonase to twice daily and add chlortrimeton 4mg  one after supper 2) stay off Coreg  3) Bystolic 5 mg daily (samples) 4) pantoprazole to Take one 30-60 min before first and last meals of the day  And Pepcid 20 mg one at bedtime 6) Take delsym two tsp every 12 hours and add tramadol 50 mg up to 2 every 4 hours to  completely suppress the urge to cough  07/18/2010 ov/[name] [name]       Allergies  1) ! * Primaxin  2) ! Ace Inhibitors   Past History:  Aortic insuffficiency > AVR June 2011 [facility] gastroesophageal reflux disease  dyslipidemia  duodenal ulcer with GI bleeding  hypertension,  07/2009-[facility]misson for Pericardial effusion. Status post pericardial window  Acute pancreatitis with pancreatic necrosis.  08/2009- Admission for fever and syncope. No source for fever seen on cultures. Syncope thought to be related to orthostasis  09/2009 admission to [facility] with necrotizing pancreatitis and pseudocyst formation, also with ATN  10/2009 admission to [facility] with pneumonia and right pleural effusion status post thoracentesis  Cough onset June  2011........................................Marland KitchenWert       - Sinus CT   05/03/2010 > no sign finding GERD with stricture  - EGD with dilation 03/23/2001 ..........................Marland KitchenArlyce Dice      Review of Systems     Objective:    wt 189 > 193 March 07, 2010 > 197 April 05, 2010 >  202 05/03/2010 > 205 07/18/2010 > 207 07/18/2010  HEENT: nl dentition, turbinates, and orophanx. Nl external ear canals without cough reflex  NECK : without JVD/Nodes/TM/ nl carotid upstrokes bilaterally  LUNGS: no acc muscle use, clear to A and P bilaterally without cough on insp or exp maneuvers  CV: RRR , mechanical S2 , no s3 or murmur or increase in P2, no edema  ABD: soft and nontender with nl excursion in the supine position. No bruits or organomegaly, bowel sounds nl  MS: warm without deformities, calf tenderness, cyanosis or clubbing  SKIN: warm and dry without lesions     04/05/10 cxr Comparison: Chest 12/05/2009.  Findings: There is some scarring in the right lung base. Small  right pleural effusion seen on the prior study has nearly  completely resolved. Left lung remains clear. Postoperative  change of median sternotomy noted.  IMPRESSION:  Mild right basilar scar with a complete resolution of the small  right effusion. No new abnormality  Assessment & Plan:

## 2010-07-25 ENCOUNTER — Telehealth: Payer: Self-pay | Admitting: Internal Medicine

## 2010-07-25 ENCOUNTER — Telehealth: Payer: Self-pay | Admitting: *Deleted

## 2010-07-25 MED ORDER — TRAMADOL HCL 50 MG PO TABS: ORAL_TABLET | ORAL | Status: DC

## 2010-07-25 NOTE — Telephone Encounter (Signed)
I'll address the hard copies.

## 2010-07-25 NOTE — Telephone Encounter (Signed)
Returning call.[name] [name] ° °

## 2010-07-25 NOTE — Telephone Encounter (Signed)
Spoke with pt's spouse. She is asking for refill on his tramadol. Rx refill sent to pharm x 1 only.

## 2010-07-25 NOTE — Telephone Encounter (Signed)
Returning call can be reached at [phone].[name] [name]

## 2010-07-25 NOTE — Telephone Encounter (Signed)
[id number].Koury Roddy, CMA  

## 2010-07-25 NOTE — Telephone Encounter (Signed)
Pt has brought in forms for short and long term disability.  Forms are on your desk.

## 2010-08-08 ENCOUNTER — Ambulatory Visit (INDEPENDENT_AMBULATORY_CARE_PROVIDER_SITE_OTHER): Payer: BC Managed Care – PPO | Admitting: Emergency Medicine

## 2010-08-08 ENCOUNTER — Other Ambulatory Visit: Payer: Self-pay | Admitting: *Deleted

## 2010-08-08 DIAGNOSIS — Z954 Presence of other heart-valve replacement: Secondary | ICD-10-CM

## 2010-08-08 DIAGNOSIS — Z7901 Long term (current) use of anticoagulants: Secondary | ICD-10-CM

## 2010-08-08 DIAGNOSIS — I4891 Unspecified atrial fibrillation: Secondary | ICD-10-CM

## 2010-08-08 DIAGNOSIS — I359 Nonrheumatic aortic valve disorder, unspecified: Secondary | ICD-10-CM

## 2010-08-08 LAB — POCT INR: INR: 2.3

## 2010-08-08 MED ORDER — POTASSIUM CHLORIDE CRYS ER 20 MEQ PO TBCR: 20.0000 meq | EXTENDED_RELEASE_TABLET | Freq: Every day | ORAL | Status: DC

## 2010-08-15 ENCOUNTER — Telehealth: Payer: Self-pay | Admitting: Internal Medicine

## 2010-08-15 NOTE — Telephone Encounter (Signed)
Tramadol last filled on 07/25/10- is this okay to refill? Note that he has no appts pending. thanks

## 2010-08-16 ENCOUNTER — Telehealth: Payer: Self-pay | Admitting: Internal Medicine

## 2010-08-16 MED ORDER — TRAMADOL HCL 50 MG PO TABS: ORAL_TABLET | ORAL | Status: DC

## 2010-08-16 NOTE — Telephone Encounter (Signed)
The quantity of tramadol needs to last until the ov so that's fine

## 2010-08-16 NOTE — Telephone Encounter (Signed)
Needs to follow the last set of instructions and schedule ov with pft's asap.  When he returns, needs all active meds in hand  Give enough tramadol to last until that ov

## 2010-08-16 NOTE — Telephone Encounter (Signed)
Called, spoke with pt's wife.  She is aware pt will need to have a f/u with PFTs which were scheduled for Aug 15 (first available for PFT and OV same day) at 9am and 10am.  She is aware tramadol rx will be sent to last until this OV and pt will need to bring all active meds to OV.  She verbalized understanding of this.    Dr. Sherene Sires, pt's wife states pt is taking 2 tablets 2-3 times per day.  This would be a quantity of 162 pills to last until OV on Aug 15.  Pls advise if this is ok.  If not, pls specify how many tablets you would like to send.  Thanks!

## 2010-08-16 NOTE — Telephone Encounter (Signed)
Pt wife aware rx sent Carron Curie, CMA

## 2010-08-16 NOTE — Telephone Encounter (Signed)
Message already in triage. Taken care of

## 2010-08-17 ENCOUNTER — Telehealth: Payer: Self-pay | Admitting: Internal Medicine

## 2010-08-17 NOTE — Telephone Encounter (Signed)
Called [facility] and spoke with the pharmacist there and they never did get the tramadol rx for #162 to last until the pts appt in august. They will get this ready for them to pick up today.  Called and lmom to make them aware and to call for any problems or questions.

## 2010-08-20 ENCOUNTER — Encounter: Payer: Self-pay | Admitting: Cardiology

## 2010-08-23 ENCOUNTER — Ambulatory Visit (INDEPENDENT_AMBULATORY_CARE_PROVIDER_SITE_OTHER): Payer: BC Managed Care – PPO | Admitting: Cardiology

## 2010-08-23 ENCOUNTER — Encounter: Payer: Self-pay | Admitting: Cardiology

## 2010-08-23 DIAGNOSIS — I4891 Unspecified atrial fibrillation: Secondary | ICD-10-CM

## 2010-08-23 DIAGNOSIS — R002 Palpitations: Secondary | ICD-10-CM

## 2010-08-23 DIAGNOSIS — I359 Nonrheumatic aortic valve disorder, unspecified: Secondary | ICD-10-CM

## 2010-08-23 DIAGNOSIS — Z954 Presence of other heart-valve replacement: Secondary | ICD-10-CM

## 2010-08-23 DIAGNOSIS — R05 Cough: Secondary | ICD-10-CM

## 2010-08-23 DIAGNOSIS — I10 Essential (primary) hypertension: Secondary | ICD-10-CM

## 2010-08-23 MED ORDER — AMOXICILLIN 500 MG PO CAPS: ORAL_CAPSULE | ORAL | Status: DC

## 2010-08-23 MED ORDER — ZOLPIDEM TARTRATE 5 MG PO TABS: 5.0000 mg | ORAL_TABLET | Freq: Every evening | ORAL | Status: DC | PRN

## 2010-08-23 NOTE — Assessment & Plan Note (Signed)
He is being followed closely by Dr. Sherene Sires.  I appreciate his help.  Unfortunately the patient did not get better when his beta blocker was changed to Bystolic.  He says he is having trouble affording this.  I asked him to discuss this with Dr. Sherene Sires.  I would like for him to be switched back to carvedilol.

## 2010-08-23 NOTE — Assessment & Plan Note (Signed)
He is doing well status post valve replacement. No change in therapy is indicated.

## 2010-08-23 NOTE — Assessment & Plan Note (Signed)
He complains of palpitations as mentioned. I will followup with the 24-hour monitor period

## 2010-08-23 NOTE — Assessment & Plan Note (Signed)
The blood pressure is at target. No change in medications is indicated. We will continue with therapeutic lifestyle changes (TLC).  

## 2010-08-23 NOTE — Progress Notes (Signed)
HPI The patient presents for followup after aortic valve replacement. Since I last saw him he is somewhat better though he still complains of significant fatigue. Is not describing any shortness of breath, PND or orthopnea. He does have palpitations at night when he says his heart is just "cutting up". He's not had any presyncope or syncope. He's not had any chest pressure, neck or arm discomfort. Unfortunately he continues to have a cough that is nonproductive and being followed by Dr. Sherene Sires.  He has not had fevers or chills.  Allergies  Allergen Reactions  . Ace Inhibitors     REACTION: cough  . Primaxin (Imipenem W/Cilastatin Sodium)     rash    Current Outpatient Prescriptions  Medication Sig Dispense Refill  . ALPRAZolam (XANAX) 0.5 MG tablet Take 0.5 mg by mouth at bedtime as needed.        . cetirizine (ZYRTEC) 10 MG tablet Take 10 mg by mouth daily as needed.        [name] [name] dextromethorphan (DELSYM) 30 MG/5ML liquid Take 60 mg by mouth as needed.        . docusate sodium (COLACE) 100 MG capsule Take 100 mg by mouth 2 (two) times daily as needed.        . famotidine (PEPCID) 20 MG tablet One at bedtime  30 tablet  11  . ferrous sulfate 324 (65 FE) MG TBEC Take 1 tablet by mouth daily.        . fluticasone (FLONASE) 50 MCG/ACT nasal spray 2 sprays by Nasal route daily.  16 g  11  . furosemide (LASIX) 20 MG tablet Take 20 mg by mouth daily as needed.        [name] [name] HYDROcodone-acetaminophen (NORCO) 5-325 MG per tablet Take 1 tablet by mouth every 6 (six) hours as needed.        . nebivolol (BYSTOLIC) 5 MG tablet Take 1 tablet (5 mg total) by mouth daily.  30 tablet  11  . Pancrelipase, Lip-Prot-Amyl, (CREON) 6000 UNITS CPEP 1-2 tabs tid with meals  540 capsule  3  . pantoprazole (PROTONIX) 40 MG tablet Take 30- 60 min before your first and last meals of the day      . potassium chloride SA (K-DUR,KLOR-CON) 20 MEQ tablet Take 1 tablet (20 mEq total) by mouth daily.  30 tablet  6  . traMADol (ULTRAM)  50 MG tablet Take 1-2 tablets every 4-6 hours as needed for cough  162 tablet  0  . warfarin (COUMADIN) 1 MG tablet Take as directed by Anticoagulation clinic  60 tablet  3  . warfarin (COUMADIN) 2 MG tablet Take 2 mg by mouth as directed.          Past Medical History  Diagnosis Date  . Aortic insufficiency     with bicuspid aortic valve (the last MRI demonstrated the aortic root to be 4.8 x 4.7 cm.) Mod regurgitation. Normal chamber size with very mild left ventricular dysfunction.)  . Dyslipidemia   . Duodenal ulcer     with GI bleeding  . Hypertension   . Pericardial effusion 07/2009    admission to [facility], s/p pericardial window  . PNA (pneumonia) 10/2009    admnission to [facility], with right pleural effusion, s/p thoracentesis  . 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 Syncope 08/2009    admission for fever and syncope, no source for fever seen on cultures. Syncope thought to be r/t orthostasis  .  Pleural effusion 10/2009    s/p thoracentesis  . Cough 06/28/2009    Dr. Sherene Sires  . GERD (gastroesophageal reflux disease) 03/23/2001    egd w/ dilation...[name] KitchenMarland KitchenArlyce Dice  . Acute pancreatitis   . GI bleed     Gastritis, ? Chem, Working for [employer]    Past Surgical History  Procedure Date  . Esophagogastroduodenoscopy 1990, 1999    Hot dog in throat (1987)/ Chicken in throat (1990)/ 01/06/97 (Dr. Kinnie Scales) Impaction, dilatation, Schatzki's Ring/ 07/1998 repeat Dilatation/ 01/10/01 stricture/dilated/ 03/08/05 Schatzki's Ring, dilated/ 06/01/08 Food Disimpaction [name] Bulb Ulcer (Dr. Ewing Schlein)  . Esophagogastroduodenoscopy 01/06/1997    (Dr. Kinnie Scales) Impaction, dilatation, Schatzki's Ring  . Valve replacement 06/2009    St. Jude mechanical valve per Dr. Laneta Simmers  . Doppler echocardiography 01/18/97    LVH, mild dilated aorta - root mod to severe aortic regurg EF 50-55%  . Esophagogastroduodenoscopy 08/09/98    Repeat dilatation [name])  .  Esophagogastroduodenoscopy 01/13/2001    stricture/ HH, duodenal ulcer / bleeding Marina Goodell)  . Esophagogastroduodenoscopy 12/25/01    esophagus stricture, dilated  . Esophagogastroduodenoscopy 03/23/2001    stricture, dilated  . Cardiolite ekg 03/10/2003    (-) ? small/apical infarct EF 50%  . Mri 2/05    LVH global hypokin EF 48% Mod A.I.  . Doppler echocardiography 09/27/03    No changes, moderate severe aortic regurg  . Doppler echocardiography 07/09/2004    Mod severe aortic regurg 2-3+ T.R., T.I.R., mild P..R.  . Esophagogastroduodenoscopy 03/08/05    Schatzki's Ring   03/13/05  Schatzki's Ring - dilated, duodenitis  . Doppler echocardiography 04/01/06    Hypokin Apex EF 55%  . Esophagogastroduodenoscopy 06/11/08    with food disimpaction.  Food impaction Ms HH  Sm bulb ulcer (Dr. Ewing Schlein)  . Doppler echocardiography 02/15/2010    Mild LVH, mild decr Sys fctn EF 40-45% Mild Dias Dysfctn Mech AV Triv AR, MR    ROS:  As stated in the HPI and negative for all other systems.  PHYSICAL EXAM BP 120/70  Pulse 71  Resp 18  Ht 5\' 11"  (1.803 m)  Wt 210 lb (95.255 kg)  BMI 29.29 kg/m2 GENERAL:  Well appearing HEENT:  Pupils equal round and reactive, fundi not visualized, oral mucosa unremarkable NECK:  No jugular venous distention, waveform within normal limits, carotid upstroke brisk and symmetric, no bruits, no thyromegaly LYMPHATICS:  No cervical, inguinal adenopathy LUNGS:  Clear to auscultation bilaterally, right basilar crackles BACK:  No CVA tenderness CHEST:  Well healed sternotomy scar. HEART:  PMI not displaced or sustained,S1 within normal limits, mechanical S2 no S3, no S4, no clicks, no rubs, no murmurs ABD:  Flat, positive bowel sounds normal in frequency in pitch, no bruits, no rebound, no guarding, no midline pulsatile mass, no hepatomegaly, no splenomegaly EXT:  2 plus pulses throughout, trace edema, no cyanosis no clubbing SKIN:  No rashes no nodules NEURO:  Cranial nerves  II through XII grossly intact, motor grossly intact throughout PSYCH:  Cognitively intact, oriented to person place and time  EKG:  [name] [name] Past his rhythm, rate 71, left axis deviation, left anterior fascicular block, poor anterior R wave depression, no acute ST-T wave changes.  ASSESSMENT AND PLAN

## 2010-08-23 NOTE — Patient Instructions (Addendum)
Your physician has recommended that you wear a holter monitor. Holter monitors are medical devices that record the heart's electrical activity. Doctors most often use these monitors to diagnose arrhythmias. Arrhythmias are problems with the speed or rhythm of the heartbeat. The monitor is a small, portable device. You can wear one while you do your normal daily activities. This is usually used to diagnose what is causing palpitations/syncope (passing out).  Follow up with Dr Antoine Poche in 4 months.  You may take Ambein 5 mg at bedtime as needed.  Continue all medications as listed.

## 2010-08-30 ENCOUNTER — Encounter: Payer: Self-pay | Admitting: Gastroenterology

## 2010-08-30 ENCOUNTER — Ambulatory Visit (INDEPENDENT_AMBULATORY_CARE_PROVIDER_SITE_OTHER): Payer: BC Managed Care – PPO | Admitting: Gastroenterology

## 2010-08-30 DIAGNOSIS — R1310 Dysphagia, unspecified: Secondary | ICD-10-CM | POA: Insufficient documentation

## 2010-08-30 DIAGNOSIS — K862 Cyst of pancreas: Secondary | ICD-10-CM

## 2010-08-30 DIAGNOSIS — K859 Acute pancreatitis without necrosis or infection, unspecified: Secondary | ICD-10-CM

## 2010-08-30 NOTE — Assessment & Plan Note (Signed)
Followed by general surgery. This is a stable and improving problem.

## 2010-08-30 NOTE — Assessment & Plan Note (Signed)
Etiology not determined 

## 2010-08-30 NOTE — Progress Notes (Signed)
History of Present Illness: [name] [name] is a 60 year old white male status post aortic valve replacement, on Coumadin, with history of esophageal stricture, remote history of bleeding peptic ulcer disease, COPD and pancreatitis, referred at the request of Dr. Para March for dysphagia. He underwent an aortic valve replacement in 2011. In September, 2011 he developed pancreatitis complicated by a pseudocyst. 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 There is a pancreas pseudocyst that is decreased in size compared to prior scan.  Over the past month he's developed dysphagia to solids and pills. He denies odynophagia or pyrosis.    Review of Systems: He has mild dyspnea on exertion. Pertinent positive and negative review of systems were noted in the above HPI section. All other review of systems were otherwise negative.    Current Medications, Allergies, Past Medical History, Past Surgical History, Family History and Social History were reviewed in [employer] [facility] electronic medical record  Vital signs were reviewed in today's medical record. Physical Exam: General: Well developed , well nourished, no acute distress Head: Normocephalic and atraumatic Eyes:  sclerae anicteric, EOMI Ears: Normal auditory acuity Mouth: No deformity or lesions Lungs: Clear throughout to auscultation Heart: Regular rate and rhythm; heart sounds are crisp. There is a 2-3/6 early systolic murmur Abdomen: Soft, non tender and non distended. No masses, hepatosplenomegaly or hernias noted. Normal Bowel sounds Rectal:deferred Musculoskeletal: Symmetrical with no gross deformities  Pulses:  Normal pulses noted Extremities: No clubbing, cyanosis, edema or deformities noted Neurological: Alert oriented x 4, grossly nonfocal Psychological:  Alert and cooperative.  Normal mood and affect

## 2010-08-30 NOTE — Patient Instructions (Signed)
Your Endoscopy is scheduled on 10/04/2010 at 11am Please contact our office 10 days prior to procedure to get the information on your coumadin You can also contact Dr Lindaann Slough office concerning the Lovenox bridge as well

## 2010-08-30 NOTE — Assessment & Plan Note (Addendum)
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  Medications #1 upper endoscopy with dilatation as indicated #2 the patient's Coumadin will have to be held and he will require Lovenox. This will be directed by cardiology

## 2010-09-05 ENCOUNTER — Ambulatory Visit (INDEPENDENT_AMBULATORY_CARE_PROVIDER_SITE_OTHER): Payer: BC Managed Care – PPO | Admitting: Emergency Medicine

## 2010-09-05 DIAGNOSIS — I359 Nonrheumatic aortic valve disorder, unspecified: Secondary | ICD-10-CM

## 2010-09-05 DIAGNOSIS — Z7901 Long term (current) use of anticoagulants: Secondary | ICD-10-CM

## 2010-09-05 DIAGNOSIS — Z954 Presence of other heart-valve replacement: Secondary | ICD-10-CM

## 2010-09-05 DIAGNOSIS — I4891 Unspecified atrial fibrillation: Secondary | ICD-10-CM

## 2010-09-12 ENCOUNTER — Ambulatory Visit (INDEPENDENT_AMBULATORY_CARE_PROVIDER_SITE_OTHER)
Admission: RE | Admit: 2010-09-12 | Discharge: 2010-09-12 | Disposition: A | Discharge status: Home / Self Care | Payer: BC Managed Care – PPO | Source: Ambulatory Visit | Attending: Internal Medicine | Admitting: Internal Medicine

## 2010-09-12 ENCOUNTER — Encounter: Payer: Self-pay | Admitting: Internal Medicine

## 2010-09-12 ENCOUNTER — Ambulatory Visit (INDEPENDENT_AMBULATORY_CARE_PROVIDER_SITE_OTHER): Payer: BC Managed Care – PPO | Admitting: Internal Medicine

## 2010-09-12 VITALS — BP 112/70 | HR 82 | Temp 98.5°F | Ht 71.0 in | Wt 204.0 lb

## 2010-09-12 DIAGNOSIS — R05 Cough: Secondary | ICD-10-CM

## 2010-09-12 DIAGNOSIS — I10 Essential (primary) hypertension: Secondary | ICD-10-CM

## 2010-09-12 DIAGNOSIS — R0600 Dyspnea, unspecified: Secondary | ICD-10-CM | POA: Insufficient documentation

## 2010-09-12 DIAGNOSIS — R059 Cough, unspecified: Secondary | ICD-10-CM

## 2010-09-12 DIAGNOSIS — K862 Cyst of pancreas: Secondary | ICD-10-CM

## 2010-09-12 DIAGNOSIS — K863 Pseudocyst of pancreas: Secondary | ICD-10-CM

## 2010-09-12 DIAGNOSIS — R0989 Other specified symptoms and signs involving the circulatory and respiratory systems: Secondary | ICD-10-CM

## 2010-09-12 LAB — PULMONARY FUNCTION TEST

## 2010-09-12 MED ORDER — PREDNISONE (PAK) 10 MG PO TABS: ORAL_TABLET | ORAL | Status: AC

## 2010-09-12 NOTE — Assessment & Plan Note (Addendum)
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

## 2010-09-12 NOTE — Progress Notes (Signed)
PFT done today. 

## 2010-09-12 NOTE — Progress Notes (Signed)
Subjective:    Patient ID: [name] [name], male    DOB: 05/14/1950, 60 y.o.   MRN: [id number]  HPI    43 yowm never smoker with chronic cough ever since AVR June 2011 at [facility]   March 07, 2010 1st pulmonary office eval cc daily mostly dry hacky cough waxes and wanes more in afternoon and evening only better after cough syrup assoc with new sensation of pnds x a year so with sensation of choking gagging and h/o dysphagia with choking on food in past but not since cough taking ppi one daily p breakfast since last GI eval ? name of doctor required previous dilation 2003 by [name] and also since then ? doctor ? date.  rec Prednisone 4 each am x 2days, 2x2days, 1x2days and stop  Change pantoprazole to Take one 30-60 min before first and last meals of the day  Take delsym two tsp every 12 hours and add tramadol 50 mg up to every 4 hours to suppress the urge to cough. Swallowing water or using ice chips/non mint and menthol containing candies (such as lifesavers or sugarless jolly ranchers) are also effective.  GERD (REFLUX) diet   April 05, 2010 ov Cough- little better, sob w/ activity--pt starts rehab next week. did not control cough with tramadol effectively. thinks flonase helping pnds, no change doe, no sign excess mucus production or purulent/ bloody secretions.  1) Increase flonase to twice daily and if nose still drips try chlortrimeton 4mg  one every 6 hours to dry it up  2) Stop Coreg  3) Start Bystolic 5 mg daily  4) Prednisone 4 each am x 2days, 2x2days, 1x2days and stop  5) pantoprazole to Take one 30-60 min before first and last meals of the day  6) Take delsym two tsp every 12 hours and add tramadol 50 mg up to 2 every 4 hours to suppress the urge to cough. Swallowing water or using ice chips/non mint and menthol containing candies (such as lifesavers or sugarless jolly ranchers) are also effective.  7) GERD (REFLUX) diet.   05/03/2010 ov cough only better while on tramadol not  taking delsym.  Worse before supper with overt HB at hs sev times a weeek. 1) Continue flonase to twice daily and if nose still drips try chlortrimeton 4mg  one every 6 hours to dry it up  2) stay off Coreg  3) Bystolic 5 mg daily (samples) 4) Prednisone 4 each am x 2days, 2x2days, 1x2days and stop  5) pantoprazole to Take one 30-60 min before first and last meals of the day  And Pepcid 20 mg one at bedtime 6) Take delsym two tsp every 12 hours and add tramadol 50 mg up to 2 every 4 hours to suppress the urge to cough. Swallowing water or using ice chips/non mint and menthol containing candies (such as lifesavers or sugarless jolly ranchers) are also effective.  GERD (REFLUX)   07/18/2010  Ov /Wert still clearing the throat and needing "cough med" didn't bring this or all of his meds as requested, no excess mucus or noct awakening.    1) Continue flonase to twice daily and add chlortrimeton 4mg  one after supper 2) stay off Coreg  3) Bystolic 5 mg daily (samples) 4) pantoprazole to Take one 30-60 min before first and last meals of the day  And Pepcid 20 mg one at bedtime 6) Take delsym two tsp every 12 hours and add tramadol 50 mg up to 2 every 4 hours to  completely suppress the urge to cough  rec Continue flonase to twice daily and add chlortrimeton 4mg  one after supper  Please see patient coordinator before you leave today  to schedule follow up with Dr [name] re difficulty swallowing > egd due in Sept 2012   09/12/2010 ov/ Wert  Cc cough much worse for a couple of weeks. Using chlortrimeton at bedtime only and no zyrtec. Can do 30 min of yardwork before "just gives out"    Sleeping ok without nocturnal  or early am exac of resp c/o's.  Pt denies any significant sore throat, dysphagia, itching, sneezing,  nasal congestion or excess/ purulent secretions,  fever, chills, sweats, unintended wt loss, pleuritic or exertional cp, hempoptysis, orthopnea pnd or leg swelling.    Also denies any  obvious fluctuation of symptoms with weather or environmental changes or other aggravating or alleviating factors.     Allergies  1) ! * Primaxin  2) ! Ace Inhibitors   Past History:  Aortic insuffficiency > AVR June 2011 [facility] gastroesophageal reflux disease  dyslipidemia  duodenal ulcer with GI bleeding  hypertension,  07/2009-Admisson for Pericardial effusion. Status post pericardial window  Acute pancreatitis with pancreatic necrosis.  08/2009- Admission for fever and syncope. No source for fever seen on cultures. 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 Nl external ear canals without cough reflex  NECK : without JVD/Nodes/TM/ nl carotid upstrokes bilaterally  LUNGS: no acc muscle use, clear to A and P bilaterally without cough on insp or exp maneuvers  CV: RRR , mechanical S2 , no s3 or murmur or increase in P2, no edema  ABD: soft and nontender with nl excursion in the supine position. No bruits or organomegaly, bowel sounds nl  MS: warm without deformities, calf tenderness, cyanosis or clubbing  SKIN: warm and dry without lesions     04/05/10 cxr Comparison: Chest 12/05/2009.  Findings: There is some scarring in the right lung base. Small  right pleural effusion seen on the prior study has nearly  completely resolved. Left lung remains clear. Postoperative  change of median sternotomy noted.   IMPRESSION:  Mild right basilar scar with a complete resolution of the small  right effusion. No new abnormality  Assessment & Plan:

## 2010-09-12 NOTE — Assessment & Plan Note (Addendum)
Of the three most common causes of chronic cough, only one (GERD)  can actually cause the other two (asthma and post nasal drip syndrome)  and perpetuate the cylce of cough inducing airway trauma, inflammation, heightened sensitivity to reflux which is prompted by the cough itself via a cyclical mechanism.    This may partially respond to steroids and look like asthma and post nasal drainage but never erradicated completely unless the cough and the secondary reflux are eliminated, preferably both at the same time.  While not intuitively obvious, many patients with chronic low grade reflux do not cough until there is a secondary insult that disturbs the protective epithelial barrier and exposes sensitive nerve endings.  This can be viral or direct physical injury such as with an endotracheal tube.   The point is that once this occurs, it is difficult to eliminate using anything but a maximally effective acid suppression regimen at least in the short run, accompanied by an appropriate diet to address non acid GERD.  See instructions for specific recommendations which were reviewed directly with the patient who was given a copy with highlighter outlining the key components.

## 2010-09-12 NOTE — Assessment & Plan Note (Signed)
Agree with Dr Antoine Poche doubt the coreg contributing to cough in absence of convincing evidence of airflow obst so ok with me to go back on coreg when completes his bystolic samples

## 2010-09-12 NOTE — Patient Instructions (Signed)
Chlortrimeton 4mg  4x daily will stop the urge to clear your throat  Take delsym two tsp every 12 hours and supplement if needed with  tramadol 50 mg up to 2 every 4 hours to suppress the urge to cough. Swallowing water or using ice chips/non mint and menthol containing candies (such as lifesavers or sugarless jolly ranchers) are also effective.  You should rest your voice and avoid activities that you know make you cough.  Once you have eliminated the cough for 3 straight days try reducing the tramadol first,  then the delsym as tolerated.    Prednisone 10 mg take  4 each am x 2 days,   2 each am x 2 days,  1 each am x2days and stop  Weight control is simply a matter of calorie balance which needs to be tilted in your favor by eating less and exercising more.  To get the most out of exercise, you need to be continuously aware that you are short of breath, but never out of breath, for 30 minutes daily. As you improve, it will actually be easier for you to do the same amount of exercise  in  30 minutes so always push to the level where you are short of breath.  If this does not result in gradual weight reduction then I strongly recommend you see a nutritionist with a food diary x 2 weeks so that we can work out a negative calorie balance which is universally effective in steady weight loss programs.  Think of your calorie balance like you do your bank account where in this case you want the balance to go down so you must take in less calories than you burn up.  It's just that simple:  Hard to do, but easy to understand.  Good luck!   Please schedule a follow up office visit in 6 weeks, call sooner if needed

## 2010-09-12 NOTE — Assessment & Plan Note (Signed)
Defer rx to GI medicine and GI surgery to sort out

## 2010-09-12 NOTE — Progress Notes (Deleted)
Subjective:    Patient ID: [name] [name], male    DOB: 1950-04-27, 60 y.o.   MRN: [id number]  HPI    55 yowm never smoker with chronic cough ever since AVR June 2011 at [facility]   March 07, 2010 1st pulmonary office eval cc daily mostly dry hacky cough waxes and wanes more in afternoon and evening only better after cough syrup assoc with new sensation of pnds x a year so with sensation of choking gagging and h/o dysphagia with choking on food in past but not since cough taking ppi one daily p breakfast since last GI eval ? name of doctor required previous dilation 2003 by [name] and also since then ? doctor ? date.  rec Prednisone 4 each am x 2days, 2x2days, 1x2days and stop  Change pantoprazole to Take one 30-60 min before first and last meals of the day  Take delsym two tsp every 12 hours and add tramadol 50 mg up to every 4 hours to suppress the urge to cough. Swallowing water or using ice chips/non mint and menthol containing candies (such as lifesavers or sugarless jolly ranchers) are also effective.  GERD (REFLUX) diet   April 05, 2010 ov Cough- little better, sob w/ activity--pt starts rehab next week. did not control cough with tramadol effectively. thinks flonase helping pnds, no change doe, no sign excess mucus production or purulent/ bloody secretions.  1) Increase flonase to twice daily and if nose still drips try chlortrimeton 4mg  one every 6 hours to dry it up  2) Stop Coreg  3) Start Bystolic 5 mg daily  4) Prednisone 4 each am x 2days, 2x2days, 1x2days and stop  5) pantoprazole to Take one 30-60 min before first and last meals of the day  6) Take delsym two tsp every 12 hours and add tramadol 50 mg up to 2 every 4 hours to suppress the urge to cough. Swallowing water or using ice chips/non mint and menthol containing candies (such as lifesavers or sugarless jolly ranchers) are also effective.  7) GERD (REFLUX) diet.   05/03/2010 ov cough only better while on tramadol not  taking delsym.  Worse before supper with overt HB at hs sev times a weeek. 1) Continue flonase to twice daily and if nose still drips try chlortrimeton 4mg  one every 6 hours to dry it up  2) stay off Coreg  3) Bystolic 5 mg daily (samples) 4) Prednisone 4 each am x 2days, 2x2days, 1x2days and stop  5) pantoprazole to Take one 30-60 min before first and last meals of the day  And Pepcid 20 mg one at bedtime 6) Take delsym two tsp every 12 hours and add tramadol 50 mg up to 2 every 4 hours to suppress the urge to cough. Swallowing water or using ice chips/non mint and menthol containing candies (such as lifesavers or sugarless jolly ranchers) are also effective.  GERD (REFLUX)   09/12/2010  Ov /Lilla Callejo still clearing the throat and needing "cough med" didn't bring this or all of his meds as requested, no excess mucus or noct awakening.    1) Continue flonase to twice daily and add chlortrimeton 4mg  one after supper 2) stay off Coreg  3) Bystolic 5 mg daily (samples) 4) pantoprazole to Take one 30-60 min before first and last meals of the day  And Pepcid 20 mg one at bedtime 6) Take delsym two tsp every 12 hours and add tramadol 50 mg up to 2 every 4 hours to  completely suppress the urge to cough  09/12/2010 ov/Basha Krygier       Allergies  1) ! * Primaxin  2) ! Ace Inhibitors   Past History:  Aortic insuffficiency > AVR June 2011 [facility] gastroesophageal reflux disease  dyslipidemia  duodenal ulcer with GI bleeding  hypertension,  07/2009-Admisson for Pericardial effusion. Status post pericardial window  Acute pancreatitis with pancreatic necrosis.  08/2009- Admission for fever and syncope. No source for fever seen on cultures. 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 Nl external ear canals without cough reflex  NECK : without JVD/Nodes/TM/ nl carotid upstrokes bilaterally  LUNGS: no acc muscle use, clear to A and P bilaterally without cough on insp or exp maneuvers  CV: RRR , mechanical S2 , no s3 or murmur or increase in P2, no edema  ABD: soft and nontender with nl excursion in the supine position. No bruits or organomegaly, bowel sounds nl  MS: warm without deformities, calf tenderness, cyanosis or clubbing  SKIN: warm and dry without lesions     04/05/10 cxr Comparison: Chest 12/05/2009.  Findings: There is some scarring in the right lung base. Small  right pleural effusion seen on the prior study has nearly  completely resolved. Left lung remains clear. Postoperative  change of median sternotomy noted.  IMPRESSION:  Mild right basilar scar with a complete resolution of the small  right effusion. No new abnormality  Assessment & Plan:

## 2010-09-18 NOTE — Progress Notes (Signed)
Quick Note:  Spoke with pt and notified of results per Dr. Wert. Pt verbalized understanding and denied any questions.  ______ 

## 2010-09-28 ENCOUNTER — Telehealth: Payer: Self-pay | Admitting: Cardiology

## 2010-09-28 ENCOUNTER — Telehealth: Payer: Self-pay | Admitting: *Deleted

## 2010-09-28 NOTE — Telephone Encounter (Signed)
Called to speak with nurse about pt coming off coumadin, have not heard back ,faxed letter on 8-2; Waiting on call back from nurse. Pts procedure is 9-6

## 2010-09-28 NOTE — Telephone Encounter (Signed)
Called pt to called procedure on 10/04/2010. Have not heard about pts coumadin.  Dr Antoine Poche will look over pts chart and contact us with a decision about coumadin and Lovonox bridge, will reschedule at that time.

## 2010-09-28 NOTE — Telephone Encounter (Signed)
Per Zella Ball, pt having procedure on 9/6. A letter was sent over on 8/2. Please advise on coumadin & Lovenox bridges.

## 2010-09-28 NOTE — Telephone Encounter (Signed)
Although this is an aortic valve and lower risk, this patient had a very complicated course and I would like to bridge him with Lovenox.  We will need to arrange this through the coumadin clinic.  We would not be able to arrange this until next week.

## 2010-10-02 NOTE — Telephone Encounter (Signed)
Spoke with pt wife and she states that GI procedure has been Cx for 10/04/10 and they do not have reschedule date as of yet. She will call to [facility] office([facility]) to inform us of R/S date.

## 2010-10-02 NOTE — Telephone Encounter (Signed)
Pt has an appointment with Coumadin Clinic in [facility] 10/03/2010.

## 2010-10-03 ENCOUNTER — Ambulatory Visit (INDEPENDENT_AMBULATORY_CARE_PROVIDER_SITE_OTHER): Payer: BC Managed Care – PPO | Admitting: Emergency Medicine

## 2010-10-03 DIAGNOSIS — I359 Nonrheumatic aortic valve disorder, unspecified: Secondary | ICD-10-CM

## 2010-10-03 DIAGNOSIS — Z7901 Long term (current) use of anticoagulants: Secondary | ICD-10-CM

## 2010-10-03 DIAGNOSIS — I4891 Unspecified atrial fibrillation: Secondary | ICD-10-CM

## 2010-10-03 DIAGNOSIS — Z954 Presence of other heart-valve replacement: Secondary | ICD-10-CM

## 2010-10-03 LAB — POCT INR: INR: 3.4

## 2010-10-04 ENCOUNTER — Other Ambulatory Visit: Payer: BC Managed Care – PPO | Admitting: Gastroenterology

## 2010-10-15 ENCOUNTER — Telehealth: Payer: Self-pay | Admitting: Internal Medicine

## 2010-10-15 NOTE — Telephone Encounter (Signed)
No need for message. °

## 2010-10-15 NOTE — Telephone Encounter (Signed)
Spoke with pt's spouse. She states that MW had wanted pt to see GI and have his esophagus stretched prior to next followup here to see if this helped with the cough. She states that surgery was cancelled pending cards clearance. She states that pt currently doing fine and wants to resched appt with MW for after GI procedure. I advised that I will go ahead and cancel appt, and they should call in the meantime if needed. She verbalized understanding, states pt will need refill on tramadol. MW, is this okay to call in? Please advise, thanks!

## 2010-10-16 ENCOUNTER — Ambulatory Visit: Payer: BC Managed Care – PPO | Admitting: Internal Medicine

## 2010-10-16 MED ORDER — TRAMADOL HCL 50 MG PO TABS: ORAL_TABLET | ORAL | Status: DC

## 2010-10-16 NOTE — Telephone Encounter (Signed)
Ok to refill x one 

## 2010-10-16 NOTE — Telephone Encounter (Signed)
LMOM informing pt medication "requested" has been called to [facility] pharmacy and to call back with any additional questions. Will medication sent and will sign off on phone note.

## 2010-10-19 ENCOUNTER — Encounter: Payer: Self-pay | Admitting: Family Medicine

## 2010-10-23 DIAGNOSIS — Z0271 Encounter for disability determination: Secondary | ICD-10-CM

## 2010-10-24 ENCOUNTER — Ambulatory Visit (INDEPENDENT_AMBULATORY_CARE_PROVIDER_SITE_OTHER): Payer: BC Managed Care – PPO | Admitting: Emergency Medicine

## 2010-10-24 DIAGNOSIS — Z7901 Long term (current) use of anticoagulants: Secondary | ICD-10-CM

## 2010-10-24 DIAGNOSIS — Z954 Presence of other heart-valve replacement: Secondary | ICD-10-CM

## 2010-10-24 DIAGNOSIS — I359 Nonrheumatic aortic valve disorder, unspecified: Secondary | ICD-10-CM

## 2010-10-24 DIAGNOSIS — I4891 Unspecified atrial fibrillation: Secondary | ICD-10-CM

## 2010-10-25 ENCOUNTER — Ambulatory Visit (INDEPENDENT_AMBULATORY_CARE_PROVIDER_SITE_OTHER): Payer: BC Managed Care – PPO | Admitting: Family Medicine

## 2010-10-25 ENCOUNTER — Encounter: Payer: Self-pay | Admitting: Family Medicine

## 2010-10-25 VITALS — BP 110/70 | HR 88 | Temp 98.9°F | Ht 71.0 in | Wt 188.8 lb

## 2010-10-25 DIAGNOSIS — Z23 Encounter for immunization: Secondary | ICD-10-CM

## 2010-10-25 DIAGNOSIS — R634 Abnormal weight loss: Secondary | ICD-10-CM

## 2010-10-25 NOTE — Patient Instructions (Signed)
You can get your results through our phone system.  Follow the instructions on the blue card. Take care.  Make sure to drink frequently and take small meals as tolerated.   If your labs are okay, then we'll notify the GI clinic about potentially getting scheduled again.

## 2010-10-25 NOTE — Progress Notes (Signed)
08/30/10- 207 lbs.  Now 188 lbs.   Weight loss stared around labor day.  His appetite sig decreased at that point.  With minimal solid food intake, he'd feel full. He gets a similar feeling with liquids.  He was planned for esophageal eval but this hasn't been done yet.  No fevers but occ feels warm at night.  No vomiting.  No passing blood.  No dark tarry stools.  Cough is some better now with the tramadol.   PMH and SH reviewed  ROS: See HPI, otherwise noncontributory.  Meds, vitals, and allergies reviewed.   nad ncat  Op wnl Neck supple rrr with audible click from valve ctab abd soft, not ttp, normal BS Old scars noted on chest Ext without edema

## 2010-10-26 ENCOUNTER — Encounter: Payer: Self-pay | Admitting: Family Medicine

## 2010-10-26 DIAGNOSIS — R634 Abnormal weight loss: Secondary | ICD-10-CM | POA: Insufficient documentation

## 2010-10-26 DIAGNOSIS — Z23 Encounter for immunization: Secondary | ICD-10-CM

## 2010-10-26 LAB — CBC WITH DIFFERENTIAL/PLATELET
Basophils Relative: 0.3 % (ref 0.0–3.0)
Eosinophils Absolute: 0 10*3/uL (ref 0.0–0.7)
Lymphocytes Relative: 17.7 % (ref 12.0–46.0)
MCHC: 32.6 g/dL (ref 30.0–36.0)
Neutrophils Relative %: 77.1 % — ABNORMAL HIGH (ref 43.0–77.0)
Platelets: 351 10*3/uL (ref 150.0–400.0)
RBC: 4.24 Mil/uL (ref 4.22–5.81)
WBC: 8.3 10*3/uL (ref 4.5–10.5)

## 2010-10-26 LAB — COMPREHENSIVE METABOLIC PANEL
ALT: 16 U/L (ref 0–53)
AST: 35 U/L (ref 0–37)
Albumin: 2.4 g/dL — ABNORMAL LOW (ref 3.5–5.2)
BUN: 13 mg/dL (ref 6–23)
Calcium: 8.3 mg/dL — ABNORMAL LOW (ref 8.4–10.5)
Chloride: 97 mEq/L (ref 96–112)
Potassium: 5.1 mEq/L (ref 3.5–5.1)
Sodium: 129 mEq/L — ABNORMAL LOW (ref 135–145)
Total Protein: 7.2 g/dL (ref 6.0–8.3)

## 2010-10-26 NOTE — Assessment & Plan Note (Signed)
D/w pt that this is a possible structural problem (ie gastric stricture that prevents normal filling), but will check basic labs.  If wnl, then refer back to GI.  Nontoxic.  He agrees with plan.  >25 min spent with face to face with patient, >50% counseling.

## 2010-10-29 ENCOUNTER — Telehealth: Payer: Self-pay | Admitting: *Deleted

## 2010-10-29 NOTE — Telephone Encounter (Signed)
Please call GI and/or cards about coordinating care for the potential EGD.  Thanks.

## 2010-10-29 NOTE — Telephone Encounter (Signed)
Wife states they would like some help with getting the GI appt set up.

## 2010-10-30 ENCOUNTER — Telehealth: Payer: Self-pay | Admitting: Gastroenterology

## 2010-10-30 NOTE — Telephone Encounter (Signed)
Called GI.  Receptionist Karen Chafe) said that the patient had not stopped his Coumadin as instructed prior to the scheduled procedure.  I asked what we needed to do to get that rescheduled.  She says the nurse schedules these procedures and she will give her the information.  I asked that they speak with the wife Fulton Mole) to give any instructions, etc.  Fulton Mole was informed that GI nurse should be calling by the end of the week and if not, the GI phone number was provided to her to call.

## 2010-10-30 NOTE — Telephone Encounter (Signed)
Pt scheduled for previsit 11/01/10@3 :30pm, EGD with dil rescheduled for 11/12/10@10am . Contacted Dr. Balinda Quails office and the coumadin clinic regarding a lovenox bridge and coming off of his coumadin. Note sent to [facility] with the coumadin clinic. Pts wife aware of appt dates and times.

## 2010-10-31 ENCOUNTER — Ambulatory Visit (INDEPENDENT_AMBULATORY_CARE_PROVIDER_SITE_OTHER): Payer: BC Managed Care – PPO | Admitting: Emergency Medicine

## 2010-10-31 ENCOUNTER — Telehealth: Payer: Self-pay

## 2010-10-31 DIAGNOSIS — I359 Nonrheumatic aortic valve disorder, unspecified: Secondary | ICD-10-CM

## 2010-10-31 DIAGNOSIS — Z954 Presence of other heart-valve replacement: Secondary | ICD-10-CM

## 2010-10-31 DIAGNOSIS — I4891 Unspecified atrial fibrillation: Secondary | ICD-10-CM

## 2010-10-31 DIAGNOSIS — Z7901 Long term (current) use of anticoagulants: Secondary | ICD-10-CM

## 2010-10-31 LAB — POCT INR: INR: 3.2

## 2010-10-31 MED ORDER — ENOXAPARIN SODIUM 80 MG/0.8ML ~~LOC~~ SOLN: 80.0000 mg | Freq: Two times a day (BID) | SUBCUTANEOUS | Status: DC

## 2010-10-31 NOTE — Telephone Encounter (Signed)
Message copied by Michele Mcalpine on Wed Oct 31, 2010 11:05 AM ------      Message from: Migdalia Dk      Created: Wed Oct 31, 2010 11:01 AM      Regarding: RE: Lovenox bridge       Pt seen in Coumadin Clinic in [place] today, Lovenox bridge instructions given to pt.  Typed out in detail in Anticoagulation note in EPIC.       ----- Message -----         From: Lily Lovings, RN         Sent: 10/30/2010   4:27 PM           To: Luan Moore, RN      Subject: Lovenox bridge                                           Pt has been rescheduled for EGD with dil for 11/12/10 with Dr. [name]. Pt is on coumadin and needs lovenox bridge. Pts wife states he has an appt at the [facility] coumadin clinic tomorrow.             Please let me know when this is taken care of.            Thank,      Selinda Michaels, RN

## 2010-10-31 NOTE — Patient Instructions (Signed)
Take your last dose of Coumadin on 11/06/10 prior to procedure on 11/12/10. On 11/07/10 No Coumadin, No Lovenox On 11/08/10 Start taking Lovenox 80mg  every 12 hours (twice daily) first dosage in the am, and Lovenox 80mg  in pm On 11/09/10 Lovenox 80mg  in am, Lovenox 80mg  in pm On 11/10/10 Lovenox 80mg  in am, Lovenox 80mg  in pm On 11/11/10 Last dosage of Lovenox prior to procedure in am. Nothing in pm on 11/11/10 prior to procedure on 11/12/10 at 10am. Resume Coumadin and Lovenox per MD instruction after procedure.

## 2010-10-31 NOTE — Telephone Encounter (Signed)
Pt has been instructed how to manage coumadin and lovenox for procedure. See note in epic.

## 2010-11-01 ENCOUNTER — Encounter: Payer: Self-pay | Admitting: Gastroenterology

## 2010-11-01 ENCOUNTER — Ambulatory Visit (AMBULATORY_SURGERY_CENTER): Payer: BC Managed Care – PPO | Admitting: *Deleted

## 2010-11-01 VITALS — Ht 71.0 in | Wt 179.0 lb

## 2010-11-01 DIAGNOSIS — R1310 Dysphagia, unspecified: Secondary | ICD-10-CM

## 2010-11-12 ENCOUNTER — Ambulatory Visit (AMBULATORY_SURGERY_CENTER): Payer: BC Managed Care – PPO | Admitting: Gastroenterology

## 2010-11-12 ENCOUNTER — Encounter: Payer: Self-pay | Admitting: Gastroenterology

## 2010-11-12 DIAGNOSIS — K222 Esophageal obstruction: Secondary | ICD-10-CM

## 2010-11-12 DIAGNOSIS — R1310 Dysphagia, unspecified: Secondary | ICD-10-CM

## 2010-11-12 MED ORDER — SODIUM CHLORIDE 0.9 % IV SOLN: 500.0000 mL | INTRAVENOUS | Status: DC

## 2010-11-12 NOTE — Patient Instructions (Addendum)
Resume coumadin in am. May restart Lovenox today.  Green and blue discharge instructions reviewed with patient and care partner.  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  Dilation (post dilation diet given) CT Scan. Office will schedule this appointment. If you have not heard anything by Wednesday morning, please call the office to inquire.

## 2010-11-13 ENCOUNTER — Telehealth: Payer: Self-pay | Admitting: *Deleted

## 2010-11-13 ENCOUNTER — Telehealth: Payer: Self-pay

## 2010-11-13 DIAGNOSIS — K863 Pseudocyst of pancreas: Secondary | ICD-10-CM

## 2010-11-13 NOTE — Telephone Encounter (Signed)
Message left for patient

## 2010-11-14 NOTE — Telephone Encounter (Signed)
Pt scheduled for CT of Abdomen and Pelvis 11/16/10 @[facility] , arrival time 9:45am for 10am appt. Pt instructed to go by radiology and pick up his contrast and instructions. Pt aware of appt date and time.

## 2010-11-16 ENCOUNTER — Ambulatory Visit (HOSPITAL_COMMUNITY)
Admission: RE | Admit: 2010-11-16 | Discharge: 2010-11-16 | Disposition: A | Discharge status: Home / Self Care | Payer: BC Managed Care – PPO | Source: Ambulatory Visit | Attending: Gastroenterology | Admitting: Gastroenterology

## 2010-11-16 DIAGNOSIS — N200 Calculus of kidney: Secondary | ICD-10-CM | POA: Insufficient documentation

## 2010-11-16 DIAGNOSIS — K316 Fistula of stomach and duodenum: Secondary | ICD-10-CM | POA: Insufficient documentation

## 2010-11-16 DIAGNOSIS — J90 Pleural effusion, not elsewhere classified: Secondary | ICD-10-CM | POA: Insufficient documentation

## 2010-11-16 DIAGNOSIS — K863 Pseudocyst of pancreas: Secondary | ICD-10-CM

## 2010-11-16 DIAGNOSIS — K862 Cyst of pancreas: Secondary | ICD-10-CM | POA: Insufficient documentation

## 2010-11-16 MED ORDER — IOHEXOL 300 MG/ML  SOLN
100.0000 mL | Freq: Once | INTRAMUSCULAR | Status: AC | PRN
  Administered 2010-11-16: 100 mL via INTRAVENOUS

## 2010-11-20 ENCOUNTER — Telehealth: Payer: Self-pay

## 2010-11-20 ENCOUNTER — Other Ambulatory Visit: Payer: Self-pay | Admitting: *Deleted

## 2010-11-20 DIAGNOSIS — K863 Pseudocyst of pancreas: Secondary | ICD-10-CM

## 2010-11-20 NOTE — Progress Notes (Signed)
Quick Note:  I will speak with radiology regarding percutaneous drainage. ______

## 2010-11-20 NOTE — Telephone Encounter (Signed)
Pt has an abscess that needs to be drained by interventional radiology. Pt needs to be off of coumadin for 4 days prior to the procedure. Call placed to Dr. Balinda Quails office for guidance regarding stopping the coumadin. Interventional radiology can do the procedure but pt must be off coumadin for 4 days. Need to call Tobi Bastos back in radiology when we hear back.

## 2010-11-21 ENCOUNTER — Ambulatory Visit (INDEPENDENT_AMBULATORY_CARE_PROVIDER_SITE_OTHER): Payer: BC Managed Care – PPO | Admitting: Emergency Medicine

## 2010-11-21 DIAGNOSIS — Z954 Presence of other heart-valve replacement: Secondary | ICD-10-CM

## 2010-11-21 DIAGNOSIS — I359 Nonrheumatic aortic valve disorder, unspecified: Secondary | ICD-10-CM

## 2010-11-21 DIAGNOSIS — I4891 Unspecified atrial fibrillation: Secondary | ICD-10-CM

## 2010-11-21 DIAGNOSIS — Z7901 Long term (current) use of anticoagulants: Secondary | ICD-10-CM

## 2010-11-22 NOTE — Progress Notes (Signed)
Per Dr Antoine Poche pt was bridged the last time he needed to be off Coumadin. Spoke with Weston Brass who states pt does need bridging d/t history of AVR.  The Coumadin Clinic will contact the pt with instructions.

## 2010-11-22 NOTE — Telephone Encounter (Signed)
Per Dr. Kirtland Bouchard, [name] [name] will contact the pt and give them instructions regarding their coumadin prior to biopsy.

## 2010-11-23 ENCOUNTER — Inpatient Hospital Stay (HOSPITAL_COMMUNITY): Admission: RE | Admit: 2010-11-23 | Payer: BC Managed Care – PPO | Source: Ambulatory Visit

## 2010-11-23 ENCOUNTER — Telehealth: Payer: Self-pay

## 2010-11-23 NOTE — Telephone Encounter (Signed)
Pt's wife called states pt is scheduled for pancreatic absess draining on 11/28/10 at 9am.  Pt has been cleared to hold Coumadin x 4 days prior to procedure, with Lovenox bridge.  Spoke with pt's wife advised of Lovenox bridge instructions.  Advised to call pharmacy and refill Enoxaparin 80mg  rx.  1 refill given when last rx sent to the pharmacy.  Take your last dose of Coumadin on 11/13/10 prior to procedure on 11/28/10.  On 11/24/10 No Coumadin, No Lovenox  On 11/25/10 Start taking Lovenox 80mg  every 12 hours (twice daily) first dosage in the am, and Lovenox 80mg  in pm  On 11/26/10 Lovenox 80mg  in am, Lovenox 80mg  in pm  On 11/27/10 Last dosage of Lovenox prior to procedure in am. Nothing in pm on 11/27/10 prior to procedure on 11/28/10 at 9am.  Resume Coumadin and Lovenox per MD instruction after procedure.

## 2010-11-28 ENCOUNTER — Other Ambulatory Visit (INDEPENDENT_AMBULATORY_CARE_PROVIDER_SITE_OTHER): Payer: Self-pay | Admitting: Surgery

## 2010-11-28 ENCOUNTER — Telehealth (INDEPENDENT_AMBULATORY_CARE_PROVIDER_SITE_OTHER): Payer: Self-pay | Admitting: Surgery

## 2010-11-28 ENCOUNTER — Telehealth: Payer: Self-pay

## 2010-11-28 ENCOUNTER — Ambulatory Visit (HOSPITAL_COMMUNITY)
Admission: RE | Admit: 2010-11-28 | Discharge: 2010-11-28 | Disposition: A | Discharge status: Home / Self Care | Payer: BC Managed Care – PPO | Source: Ambulatory Visit | Attending: Gastroenterology | Admitting: Gastroenterology

## 2010-11-28 ENCOUNTER — Ambulatory Visit (INDEPENDENT_AMBULATORY_CARE_PROVIDER_SITE_OTHER): Payer: BC Managed Care – PPO | Admitting: Physician Assistant

## 2010-11-28 ENCOUNTER — Encounter: Payer: Self-pay | Admitting: Physician Assistant

## 2010-11-28 ENCOUNTER — Inpatient Hospital Stay (HOSPITAL_COMMUNITY)
Admission: AD | Admit: 2010-11-28 | Discharge: 2010-12-07 | DRG: 468 | Disposition: A | Discharge status: Home Health | Payer: BC Managed Care – PPO | Source: Ambulatory Visit | Attending: Gastroenterology | Admitting: Gastroenterology

## 2010-11-28 VITALS — BP 112/78 | HR 74 | Ht 71.0 in

## 2010-11-28 DIAGNOSIS — D649 Anemia, unspecified: Secondary | ICD-10-CM

## 2010-11-28 DIAGNOSIS — K862 Cyst of pancreas: Secondary | ICD-10-CM | POA: Insufficient documentation

## 2010-11-28 DIAGNOSIS — K863 Pseudocyst of pancreas: Secondary | ICD-10-CM

## 2010-11-28 DIAGNOSIS — I4891 Unspecified atrial fibrillation: Secondary | ICD-10-CM | POA: Diagnosis present

## 2010-11-28 DIAGNOSIS — R195 Other fecal abnormalities: Secondary | ICD-10-CM

## 2010-11-28 DIAGNOSIS — G4733 Obstructive sleep apnea (adult) (pediatric): Secondary | ICD-10-CM | POA: Diagnosis present

## 2010-11-28 DIAGNOSIS — Z8711 Personal history of peptic ulcer disease: Secondary | ICD-10-CM

## 2010-11-28 DIAGNOSIS — R059 Cough, unspecified: Secondary | ICD-10-CM | POA: Diagnosis present

## 2010-11-28 DIAGNOSIS — E441 Mild protein-calorie malnutrition: Secondary | ICD-10-CM | POA: Diagnosis present

## 2010-11-28 DIAGNOSIS — Z954 Presence of other heart-valve replacement: Secondary | ICD-10-CM

## 2010-11-28 DIAGNOSIS — E785 Hyperlipidemia, unspecified: Secondary | ICD-10-CM | POA: Diagnosis present

## 2010-11-28 DIAGNOSIS — R1310 Dysphagia, unspecified: Secondary | ICD-10-CM | POA: Insufficient documentation

## 2010-11-28 DIAGNOSIS — I509 Heart failure, unspecified: Secondary | ICD-10-CM | POA: Diagnosis present

## 2010-11-28 DIAGNOSIS — B954 Other streptococcus as the cause of diseases classified elsewhere: Secondary | ICD-10-CM | POA: Diagnosis present

## 2010-11-28 DIAGNOSIS — I714 Abdominal aortic aneurysm, without rupture, unspecified: Secondary | ICD-10-CM | POA: Diagnosis present

## 2010-11-28 DIAGNOSIS — Z7901 Long term (current) use of anticoagulants: Secondary | ICD-10-CM

## 2010-11-28 DIAGNOSIS — R05 Cough: Secondary | ICD-10-CM | POA: Diagnosis present

## 2010-11-28 DIAGNOSIS — L0291 Cutaneous abscess, unspecified: Secondary | ICD-10-CM

## 2010-11-28 DIAGNOSIS — D62 Acute posthemorrhagic anemia: Secondary | ICD-10-CM

## 2010-11-28 DIAGNOSIS — R188 Other ascites: Secondary | ICD-10-CM

## 2010-11-28 DIAGNOSIS — K219 Gastro-esophageal reflux disease without esophagitis: Secondary | ICD-10-CM | POA: Diagnosis present

## 2010-11-28 DIAGNOSIS — R609 Edema, unspecified: Secondary | ICD-10-CM | POA: Diagnosis present

## 2010-11-28 DIAGNOSIS — K316 Fistula of stomach and duodenum: Secondary | ICD-10-CM | POA: Diagnosis present

## 2010-11-28 DIAGNOSIS — E669 Obesity, unspecified: Secondary | ICD-10-CM | POA: Diagnosis present

## 2010-11-28 DIAGNOSIS — I4819 Other persistent atrial fibrillation: Secondary | ICD-10-CM | POA: Insufficient documentation

## 2010-11-28 DIAGNOSIS — K651 Peritoneal abscess: Principal | ICD-10-CM | POA: Diagnosis present

## 2010-11-28 DIAGNOSIS — I10 Essential (primary) hypertension: Secondary | ICD-10-CM | POA: Diagnosis present

## 2010-11-28 DIAGNOSIS — R630 Anorexia: Secondary | ICD-10-CM | POA: Diagnosis present

## 2010-11-28 DIAGNOSIS — E7800 Pure hypercholesterolemia, unspecified: Secondary | ICD-10-CM | POA: Diagnosis present

## 2010-11-28 DIAGNOSIS — R634 Abnormal weight loss: Secondary | ICD-10-CM | POA: Insufficient documentation

## 2010-11-28 HISTORY — DX: Anemia, unspecified: D64.9

## 2010-11-28 HISTORY — DX: Encounter for other specified aftercare: Z51.89

## 2010-11-28 HISTORY — DX: Reserved for inherently not codable concepts without codable children: IMO0001

## 2010-11-28 LAB — CBC
HCT: 21.4 % — ABNORMAL LOW (ref 39.0–52.0)
Hemoglobin: 7 g/dL — ABNORMAL LOW (ref 13.0–17.0)
MCH: 27.1 pg (ref 26.0–34.0)
MCHC: 32.7 g/dL (ref 30.0–36.0)
MCV: 82.9 fL (ref 78.0–100.0)
RBC: 2.58 MIL/uL — ABNORMAL LOW (ref 4.22–5.81)

## 2010-11-28 LAB — COMPREHENSIVE METABOLIC PANEL
AST: 19 U/L (ref 0–37)
Albumin: 1.8 g/dL — ABNORMAL LOW (ref 3.5–5.2)
Alkaline Phosphatase: 90 U/L (ref 39–117)
BUN: 10 mg/dL (ref 6–23)
CO2: 22 mEq/L (ref 19–32)
Chloride: 100 mEq/L (ref 96–112)
Creatinine, Ser: 0.72 mg/dL (ref 0.50–1.35)
GFR calc non Af Amer: >90 mL/min (ref >90)
Potassium: 3.9 mEq/L (ref 3.5–5.1)
Total Bilirubin: 0.4 mg/dL (ref 0.3–1.2)

## 2010-11-28 LAB — MRSA PCR SCREENING: MRSA by PCR: NEGATIVE

## 2010-11-28 NOTE — Progress Notes (Signed)
Quick Note:  Pt has a severe anemia. He needs to be evaluated today. Where is he? ______

## 2010-11-28 NOTE — Progress Notes (Signed)
Agree with Ms. Esterwood's assessment and plan. Carl E. Gessner, MD, FACG   

## 2010-11-28 NOTE — Progress Notes (Signed)
Subjective:    Patient ID: [name] [name], male    DOB: [birth year], 60 y.o.   MRN: [id number]  HPI [name] is a very nice complicated 60 year old white male with multiple medical problems. He has history of acute severe necrotizing pancreatitis in 2011 which was complicated by a chronic pancreatic pseudocyst. He was hospitalized in October of last year with a large pericardial effusion which required pericardial window. He also has history of bicuspid aortic valve, ascending aortic aneurysm and is status post aortic valve replacement with a St. Jude valve done per Dr. Laneta Simmers in June of 2011. He also has history of hypertension and chronic GERD . Is maintained on chronic Coumadin.  His pancreatic pseudocyst has been followed and unfortunately has persisted over the past year and a half . Most recently he was referred to Dr. [name] with complaints of weight loss early satiety and dysphagia. 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 Otherwise the exam was felt to be normal. Subsequent CT scan of the abdomen and pelvis on 11/16/2010 shows a thickwalled enhancing infected subdiaphragmatic pseudocyst on the right side measuring 18 cm a fistulous tract appears to extend from the duodenum to this collection in the duodenum demonstrates moderate inflammatory changes. He also has a persistent small intra-pancreatic pseudocyst in the body tail region no acute inflammation of the pancreas. Aorta normal in caliber no dissection small right pleural effusion.  Patient was scheduled today to undergo percutaneous drainage of the pseudocyst with interventional radiology. He had been taken off of his Coumadin and was on Lovenox bridge. Unfortunately with preoperative labs his INR was felt to be too high at 2.21 protime of 24.9 despite being off of his Coumadin since  Friday 10/26. Also the WBC was 4.8 hemoglobin 7 hematocrit 21.4 MCV of 82.9. Last CBC was done on September 27 showed hemoglobin of 11.3 hematocrit 34.6. Patient was referred back here. He has been on iron supplementation sedation but has not noted any melena or hematochezia. Generally his appetite is very poor and he is eating little due to our early satiety. He is not having any regular ,significant abdominal pain, no nausea or vomiting. He does complain of fatigue which has worsened over the past month.   Review of Systems  Constitutional: Positive for appetite change, fatigue and unexpected weight change.  HENT: Negative.   Eyes: Negative.   Respiratory: Negative.   Cardiovascular: Negative.   Gastrointestinal: Positive for abdominal pain.  Genitourinary: Negative.   Musculoskeletal: Negative.   Skin: Negative.   Neurological: Positive for weakness.  Hematological: Bruises/bleeds easily.  Psychiatric/Behavioral: Negative.    Outpatient Prescriptions Prior to Visit  Medication Sig Dispense Refill  . ALPRAZolam (XANAX) 0.5 MG tablet Take 0.5 mg by mouth at bedtime as needed.        [name] amoxicillin (AMOXIL) 500 MG capsule Take 4 capsules one hour before dental work.  4 capsule  3  . chlorpheniramine (CHLOR-TRIMETON) 4 MG tablet Take 4 mg by mouth as needed.        [name] dextromethorphan (DELSYM) 30 MG/5ML liquid Take 60 mg by mouth as needed.        . docusate sodium (COLACE) 100 MG capsule Take 100 mg by mouth every other day.       . enoxaparin (LOVENOX) 80 MG/0.8ML SOLN Inject 0.8 mLs (80 mg total) into the  skin every 12 (twelve) hours.  10 Syringe  1  . famotidine (PEPCID) 20 MG tablet One at bedtime  30 tablet  11  . ferrous sulfate 324 (65 FE) MG TBEC Take 1 tablet by mouth 2 (two) times daily.       . fluticasone (FLONASE) 50 MCG/ACT nasal spray 2 sprays by Nasal route daily.  16 g  11  . furosemide (LASIX) 20 MG tablet Take 20 mg by mouth daily as needed.        . nebivolol (BYSTOLIC)  5 MG tablet Take 1 tablet (5 mg total) by mouth daily.  30 tablet  11  . Pancrelipase, Lip-Prot-Amyl, (CREON) 6000 UNITS CPEP 1-2 tabs tid with meals  540 capsule  3  . pantoprazole (PROTONIX) 40 MG tablet Take 30- 60 min before your first and last meals of the day      . potassium chloride SA (K-DUR,KLOR-CON) 20 MEQ tablet Take 1 tablet (20 mEq total) by mouth daily.  30 tablet  6  . traMADol (ULTRAM) 50 MG tablet Take 1-2 tablets every 4-6 hours as needed for cough  120 tablet  1  . warfarin (COUMADIN) 1 MG tablet Take as directed by Anticoagulation clinic  60 tablet  3  . zolpidem (AMBIEN) 5 MG tablet Take 1 tablet (5 mg total) by mouth at bedtime as needed for sleep.  30 tablet  3  . warfarin (COUMADIN) 2 MG tablet Take 2 mg by mouth as directed.            Allergies  Allergen Reactions  . Ace Inhibitors     REACTION: cough  . Primaxin (Imipenem W/Cilastatin Sodium)     rash   Active Ambulatory Problems    Diagnosis Date Noted  . NEOP, UB, SKIN 08/08/2006  . HYPERCHOLESTEROLEMIA, PURE 07/31/2006  . OBESITY 07/17/2006  . OBSTRUCTIVE SLEEP APNEA 10/05/2009  . HYPERTENSION 07/17/2006  . PERICARDIAL EFFUSION 11/02/2009  . AORTIC INSUFFICIENCY 07/17/2006  . ATRIAL FIBRILLATION 08/15/2009  . GERD 07/17/2006  . ACUTE PANCREATITIS 11/02/2009  . Cyst and pseudocyst of pancreas 10/19/2009  . DERMATITIS 10/05/2008  . PALPITATIONS 09/19/2009  . ELEVATION, TRANSAMINASE/LDH LEVELS 07/31/2006  . HYPERGLYCEMIA 07/17/2006  . LIVER FUNCTION TESTS, ABNORMAL 07/17/2006  . FATIGUE 02/13/2010  . Cough 02/15/2010  . AORTIC VALVE REPLACEMENT, HX OF 02/15/2010  . Long term current use of anticoagulant 04/20/2010  . Shoulder pain, right 06/07/2010  . Dysphagia, unspecified 08/30/2010  . Dyspnea 09/12/2010  . Weight loss, abnormal 10/26/2010  . Infected pancreatic pseudocyst 11/28/2010   Resolved Ambulatory Problems    Diagnosis Date Noted  . DISORDER, CARBOHYDRATE METABOLISM NOS 07/31/2006    . HYPOTENSION 12/12/2009  . SNORING 06/05/2009   Past Medical History  Diagnosis Date  . Aortic insufficiency   . Dyslipidemia   . Duodenal ulcer   . Hypertension   . Pericardial effusion 07/2009  . PNA (pneumonia) 10/2009  . Necrotizing pancreatitis 09/2009  . Syncope 08/2009  . Pleural effusion 10/2009  . GERD (gastroesophageal reflux disease) 03/23/2001  . GI bleed   . CHF (congestive heart failure)   . Orthostasis 08/2009   History   Social History  . Marital Status: Married    Spouse Name: N/A    Number of Children: 2  . 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 Smoking status: Never Smoker   . Smokeless tobacco: Never Used  .  Alcohol Use: No  . Drug Use: No  . Sexually Active: Not on file   Other Topics Concern  . Not on file   Social History Narrative  . No narrative on file    Objective:   Physical Exam Well-developed chronically ill appearing white male appears older than stated age, sitting in a wheelchair company by his wife, alert oriented x3 pleasant blood pressure 112/78 pulse 74 O2 sat 98 on room air, HEENT; nonicteric, normocephalic, EOM,I PERRLA, sclera anicteric conjunctiva pal,enECK; Supple no JVD ,Cardiovascular; regular rate and rhythm with S1-S2 with mechanical valve click, pulmonary; clear bilaterally, Abdomen soft full across the upper abdomen with no definite palpable mass, no guarding, bowel sounds active, Recta;l exam ;dark green stool Hemoccult positive, Extremities; trace edema ankles,  Skin scattered ecchymoses, he has a very fine dry rash on his back, Psych ;mood and affect normal and appropriate.        Assessment & Plan:  #48 60 year old white male with a very large infected chronic pancreatic pseudocyst with fistulization to the duodenum.  #2 anemia with hemoglobin drop of 4 g in the past one month, Hemoccult positive stool or, with no overt GI bleeding. Suspect he  has been using intermittently possibly from inflammation at the fistulization site or bleeding from the pseudocyst itself in the setting of chronic anticoagulation. Patient symptomatic with fatigue.  #3 Chronic anticoagulation with Coumadin and currently on a Lovenox bridge in anticipation of pseudocyst drainage  #4 status post aortic valve replacement St. Jude  #5 history of severe necrotizing pancreatitis with chronic pancreatic pseudocyst  #6 history of large pericardial effusion status post pericardial window of July 2011 number status post thoracentesis for right effusion September 2011  #7 history of esophageal stricture status post recent dilation  Plan; patient will be admitted to [facility] for transfusion of 2 units packed RBCs initially with intent to keep his hemoglobin in the 9 range. Pharmacy consult to help with Lovenox dosing and bridging, continue holding Coumadin Will schedule for upper endoscopy with Dr. [name] to reassess area of fistulization for evidence of bleeding We'll consult interventional radiology to plan drainage of the pseudocyst while inpatient For further details please see the orders.

## 2010-11-28 NOTE — Telephone Encounter (Signed)
Pt scheduled to see Amy Esterwood PA today at 4pm. Pt aware of appt date and time.

## 2010-11-28 NOTE — Telephone Encounter (Signed)
Message copied by Michele Mcalpine on Wed Nov 28, 2010 10:16 AM ------      Message from: [name] D      Created: Wed Nov 28, 2010  9:45 AM       Pt has a severe anemia.  He needs to be evaluated today.  Where is he?

## 2010-11-28 NOTE — Telephone Encounter (Signed)
I SPOKE WITH MR Day'S WIFE RE APTT CX.

## 2010-11-28 NOTE — Telephone Encounter (Signed)
Pt was supposed to have abscess drained today but INR was to high. Pt has been rescheduled for Friday for procedure. Pt had CBC drawn today and Hgb is 7. Per Dr. [name] pt needs to be evaluated today, do hemoccult test and receive some blood. Called pts home and was told to call back around 11:30am. Pt not at home yet.  Called and left message on answering machine for pt to call back asap.

## 2010-11-29 ENCOUNTER — Other Ambulatory Visit: Payer: Self-pay | Admitting: Gastroenterology

## 2010-11-29 DIAGNOSIS — D649 Anemia, unspecified: Secondary | ICD-10-CM

## 2010-11-29 LAB — HEMOGLOBIN AND HEMATOCRIT, BLOOD: Hemoglobin: 9.1 g/dL — ABNORMAL LOW (ref 13.0–17.0)

## 2010-11-29 LAB — PREALBUMIN: Prealbumin: 2.8 mg/dL — ABNORMAL LOW (ref 17.0–34.0)

## 2010-11-29 NOTE — Progress Notes (Signed)
Reviewed and agree with management. Leo Weyandt D. Gailyn Crook, M.D., FACG  

## 2010-11-30 ENCOUNTER — Ambulatory Visit (INDEPENDENT_AMBULATORY_CARE_PROVIDER_SITE_OTHER): Payer: Self-pay | Admitting: Surgery

## 2010-11-30 ENCOUNTER — Ambulatory Visit (HOSPITAL_COMMUNITY): Payer: BC Managed Care – PPO

## 2010-11-30 ENCOUNTER — Inpatient Hospital Stay (HOSPITAL_COMMUNITY): Payer: BC Managed Care – PPO

## 2010-11-30 LAB — PROTIME-INR: INR: 2.15 — ABNORMAL HIGH (ref 0.00–1.49)

## 2010-12-01 DIAGNOSIS — K862 Cyst of pancreas: Secondary | ICD-10-CM

## 2010-12-01 DIAGNOSIS — Z954 Presence of other heart-valve replacement: Secondary | ICD-10-CM

## 2010-12-01 DIAGNOSIS — K863 Pseudocyst of pancreas: Secondary | ICD-10-CM

## 2010-12-01 DIAGNOSIS — D62 Acute posthemorrhagic anemia: Secondary | ICD-10-CM

## 2010-12-01 LAB — URINALYSIS, ROUTINE W REFLEX MICROSCOPIC
Glucose, UA: NEGATIVE mg/dL
Leukocytes, UA: NEGATIVE
Specific Gravity, Urine: 1.012 (ref 1.005–1.030)
pH: 6 (ref 5.0–8.0)

## 2010-12-01 LAB — CBC
HCT: 26.5 % — ABNORMAL LOW (ref 39.0–52.0)
MCV: 83.6 fL (ref 78.0–100.0)
RBC: 3.17 MIL/uL — ABNORMAL LOW (ref 4.22–5.81)
RDW: 18 % — ABNORMAL HIGH (ref 11.5–15.5)
WBC: 6.3 10*3/uL (ref 4.0–10.5)

## 2010-12-01 LAB — PROTIME-INR: INR: 1.84 — ABNORMAL HIGH (ref 0.00–1.49)

## 2010-12-01 MED ORDER — ZOLPIDEM TARTRATE 5 MG PO TABS: 5.0000 mg | ORAL_TABLET | Freq: Every evening | ORAL | Status: DC | PRN

## 2010-12-01 MED ORDER — DIPHENHYDRAMINE HCL 25 MG PO CAPS
25.0000 mg | ORAL_CAPSULE | Freq: Four times a day (QID) | ORAL | Status: DC | PRN
  Administered 2010-12-02: 25 mg via ORAL

## 2010-12-01 MED ORDER — PANCRELIPASE (LIP-PROT-AMYL) 12000-38000 UNITS PO CPEP
2.0000 | ORAL_CAPSULE | Freq: Three times a day (TID) | ORAL | Status: DC
  Administered 2010-12-02 – 2010-12-03 (×5): 2 via ORAL
  Filled 2010-12-01 (×9): qty 2

## 2010-12-01 MED ORDER — FLUTICASONE PROPIONATE 50 MCG/ACT NA SUSP
2.0000 | Freq: Every day | NASAL | Status: DC
Start: 2010-12-01 — End: 2010-12-07
  Administered 2010-12-01 – 2010-12-07 (×7): 2 via NASAL
  Filled 2010-12-01 (×2): qty 16

## 2010-12-01 MED ORDER — ONDANSETRON HCL 4 MG/2ML IJ SOLN: 4.0000 mg | Freq: Four times a day (QID) | INTRAMUSCULAR | Status: DC | PRN

## 2010-12-01 MED ORDER — ENSURE CLINICAL ST REVIGOR PO LIQD
237.0000 mL | Freq: Two times a day (BID) | ORAL | Status: DC
  Administered 2010-12-01 – 2010-12-07 (×12): 237 mL via ORAL

## 2010-12-01 MED ORDER — ALPRAZOLAM 0.5 MG PO TABS
0.5000 mg | ORAL_TABLET | Freq: Every day | ORAL | Status: DC
  Administered 2010-12-01 – 2010-12-07 (×7): 0.5 mg via ORAL
  Filled 2010-12-01 (×3): qty 1

## 2010-12-01 MED ORDER — POTASSIUM CHLORIDE CRYS ER 20 MEQ PO TBCR
20.0000 meq | EXTENDED_RELEASE_TABLET | Freq: Every day | ORAL | Status: DC
  Administered 2010-12-01 – 2010-12-07 (×7): 20 meq via ORAL
  Filled 2010-12-01 (×5): qty 1

## 2010-12-01 MED ORDER — ENOXAPARIN SODIUM 80 MG/0.8ML ~~LOC~~ SOLN
80.0000 mg | Freq: Two times a day (BID) | SUBCUTANEOUS | Status: DC
  Administered 2010-12-02: 80 mg via SUBCUTANEOUS
  Filled 2010-12-01 (×3): qty 0.8

## 2010-12-01 MED ORDER — HYDROCORTISONE 1 % EX CREA
TOPICAL_CREAM | CUTANEOUS | Status: DC | PRN
  Filled 2010-12-01 (×2): qty 28

## 2010-12-01 MED ORDER — DEXTROSE-NACL 5-0.45 % IV SOLN
INTRAVENOUS | Status: DC
  Administered 2010-12-03 – 2010-12-06 (×3): via INTRAVENOUS

## 2010-12-01 MED ORDER — FUROSEMIDE 20 MG PO TABS
20.0000 mg | ORAL_TABLET | Freq: Every day | ORAL | Status: DC | PRN
  Filled 2010-12-01: qty 1

## 2010-12-01 MED ORDER — TRAMADOL HCL 50 MG PO TABS: 50.0000 mg | ORAL_TABLET | ORAL | Status: DC | PRN

## 2010-12-01 MED ORDER — PANTOPRAZOLE SODIUM 40 MG PO TBEC
40.0000 mg | DELAYED_RELEASE_TABLET | Freq: Two times a day (BID) | ORAL | Status: DC
  Administered 2010-12-01 – 2010-12-02 (×3): 40 mg via ORAL
  Filled 2010-12-01: qty 1

## 2010-12-01 MED ORDER — TRAMADOL HCL 50 MG PO TABS
50.0000 mg | ORAL_TABLET | Freq: Four times a day (QID) | ORAL | Status: DC | PRN
  Administered 2010-12-03 – 2010-12-04 (×3): 100 mg via ORAL
  Administered 2010-12-05: 50 mg via ORAL
  Filled 2010-12-01 (×3): qty 2

## 2010-12-01 MED ORDER — NEBIVOLOL HCL 5 MG PO TABS
5.0000 mg | ORAL_TABLET | Freq: Every day | ORAL | Status: DC
  Administered 2010-12-01 – 2010-12-07 (×7): 5 mg via ORAL
  Filled 2010-12-01 (×7): qty 1

## 2010-12-01 MED ORDER — DOCUSATE SODIUM 100 MG PO CAPS
100.0000 mg | ORAL_CAPSULE | ORAL | Status: DC
  Administered 2010-12-01 – 2010-12-05 (×3): 100 mg via ORAL
  Filled 2010-12-01 (×3): qty 1

## 2010-12-01 MED ORDER — FUROSEMIDE 20 MG PO TABS
20.0000 mg | ORAL_TABLET | Freq: Every day | ORAL | Status: DC
  Administered 2010-12-02 – 2010-12-07 (×6): 20 mg via ORAL
  Filled 2010-12-01 (×6): qty 1

## 2010-12-01 MED ORDER — ALPRAZOLAM 0.5 MG PO TABS: 0.5000 mg | ORAL_TABLET | Freq: Every evening | ORAL | Status: DC | PRN

## 2010-12-02 DIAGNOSIS — K863 Pseudocyst of pancreas: Secondary | ICD-10-CM

## 2010-12-02 DIAGNOSIS — K862 Cyst of pancreas: Secondary | ICD-10-CM

## 2010-12-02 DIAGNOSIS — Z954 Presence of other heart-valve replacement: Secondary | ICD-10-CM

## 2010-12-02 LAB — CROSSMATCH
ABO/RH(D): O POS
Antibody Screen: NEGATIVE
Unit division: 0
Unit division: 0

## 2010-12-02 LAB — PROTIME-INR
INR: 1.63 — ABNORMAL HIGH (ref 0.00–1.49)
Prothrombin Time: 19.6 seconds — ABNORMAL HIGH (ref 11.6–15.2)

## 2010-12-02 LAB — CBC
Hemoglobin: 9.1 g/dL — ABNORMAL LOW (ref 13.0–17.0)
RBC: 3.29 MIL/uL — ABNORMAL LOW (ref 4.22–5.81)
WBC: 6.7 10*3/uL (ref 4.0–10.5)

## 2010-12-02 NOTE — Progress Notes (Signed)
For IR percutaneous drainage of large pseudocyst tomorrow.   Agree with Ms. Guenther's assessment and plan. Iva Boop, MD, Clementeen Graham

## 2010-12-02 NOTE — Progress Notes (Signed)
  Subjective: No complaints except for mild bilateral lower extremity itching, ?rash.   Objective: Vital signs in last 24 hours: Temp:  [97.3 F (36.3 C)-99.1 F (37.3 C)] 97.3 F (36.3 C) (11/04 0500) Pulse Rate:  [88-90] 90  (11/04 0500) Resp:  [16-18] 18  (11/04 0500) BP: (102-118)/(63-70) 102/67 mmHg (11/04 0500) SpO2:  [96 %-98 %] 97 % (11/04 0500) Weight:  [86.7 kg (191 lb 2.2 oz)] 191 lb 2.2 oz (86.7 kg) (11/04 0500) Last BM Date: 12/02/10 General:   Alert,  Well-developed, well-nourished, pleasant and cooperative in NAD. Wife at bedside. Heart:  Regular rate and rhythm; AVR click Abdomen:  Soft, nontender and nondistended.  Normal bowel sounds, without guarding, and without rebound.   Extremities:  1-2+ Bilateral LE edema, improved over yesterday. Lungs:  Decreased breath sounds RUL and RML, otherwise CTA bilaterally. Neurologic:  Alert and  oriented x4;  grossly normal neurologically. Skin:  Intact without significant lesions. On bothe lower extremities between ankles and knees there are some areas of red striations, non-raised. Psych:  Alert and cooperative. Normal mood and affect.  Intake/Output from previous day: 11/03 0701 - 11/04 0700 In: 2150 [P.O.:600; I.V.:1550] Out: 1925 [Urine:1925] Intake/Output this shift: Total I/O In: 360 [P.O.:360] Out: -   Lab Results:  Basename 12/02/10 0540 12/01/10 0500  WBC 6.7 6.3  HGB 9.1* 8.8*  HCT 27.7* 26.5*  PLT 221 212    Basename 12/02/10 0540 12/01/10 0500  LABPROT 19.6* 21.6*  INR 1.63* 1.84*    Urinalysis:  Cloudy, neg nitrite,neg leukocytes  Assessment / Plan: 1. Large infected pseudocyst with fistulization to duodenum. To Interventional Radiology tomorrow for drainage since INR down now. 2. Low grade temp, resolved. 3. Urinary urgency, malodorous urine, Urinalysis unremarkable really.  4.  AVR, Coumadin on hold. Currently on Lovenox. INR 1.63.   5. Anemia, recent 4gm drop in hemoglobin. EGD negative except  for fistula in duodenum. May need eventual colonoscopy. Hemoglobin stable. 6. Mild-moderately decreased breath sound RML and RLL, small pleural effusion with elevation of right hemidiaphragm from pseudocyst seen on scan few days ago. Trial of incentive spirometry for lung expansion. 7. Peripheral edema, improving with addition of daily Lasix.  8. Mild bilateral lower extremity rash, watch for now.   LOS: 4 days   Willette Cluster  12/02/2010, 10:01 AM

## 2010-12-03 ENCOUNTER — Inpatient Hospital Stay (HOSPITAL_COMMUNITY)
Admission: AD | Admit: 2010-12-03 | Discharge: 2010-12-03 | Disposition: A | Discharge status: Home / Self Care | Payer: BC Managed Care – PPO | Source: Ambulatory Visit | Attending: Gastroenterology | Admitting: Gastroenterology

## 2010-12-03 ENCOUNTER — Ambulatory Visit (HOSPITAL_COMMUNITY): Payer: BC Managed Care – PPO

## 2010-12-03 ENCOUNTER — Ambulatory Visit (HOSPITAL_COMMUNITY): Admit: 2010-12-03 | Payer: BC Managed Care – PPO

## 2010-12-03 LAB — PROTIME-INR: Prothrombin Time: 18.1 seconds — ABNORMAL HIGH (ref 11.6–15.2)

## 2010-12-03 MED ORDER — FENTANYL CITRATE 0.05 MG/ML IJ SOLN
INTRAMUSCULAR | Status: AC
  Filled 2010-12-03: qty 4

## 2010-12-03 MED ORDER — DEXTROMETHORPHAN POLISTIREX 30 MG/5ML PO LQCR
30.0000 mg | Freq: Four times a day (QID) | ORAL | Status: DC | PRN
  Filled 2010-12-03 (×2): qty 5

## 2010-12-03 MED ORDER — PANTOPRAZOLE SODIUM 40 MG PO TBEC
40.0000 mg | DELAYED_RELEASE_TABLET | Freq: Two times a day (BID) | ORAL | Status: DC
  Administered 2010-12-03 – 2010-12-05 (×5): 40 mg via ORAL
  Filled 2010-12-03 (×4): qty 1

## 2010-12-03 MED ORDER — PANCRELIPASE (LIP-PROT-AMYL) 12000-38000 UNITS PO CPEP
2.0000 | ORAL_CAPSULE | Freq: Three times a day (TID) | ORAL | Status: DC
  Administered 2010-12-03 – 2010-12-07 (×14): 2 via ORAL
  Filled 2010-12-03 (×17): qty 2

## 2010-12-03 MED ORDER — MIDAZOLAM HCL 2 MG/2ML IJ SOLN
INTRAMUSCULAR | Status: AC
  Filled 2010-12-03: qty 4

## 2010-12-03 MED ORDER — MORPHINE SULFATE 4 MG/ML IJ SOLN
2.0000 mg | INTRAMUSCULAR | Status: DC | PRN
  Administered 2010-12-03 – 2010-12-06 (×3): 2 mg via INTRAVENOUS
  Filled 2010-12-03 (×3): qty 1

## 2010-12-03 MED ORDER — MIDAZOLAM HCL 5 MG/5ML IJ SOLN
INTRAMUSCULAR | Status: AC | PRN
  Administered 2010-12-03: 1 mg via INTRAVENOUS

## 2010-12-03 MED ORDER — MORPHINE SULFATE 2 MG/ML IJ SOLN: 2.0000 mg | INTRAMUSCULAR | Status: DC | PRN

## 2010-12-03 NOTE — Progress Notes (Signed)
ANTICOAGULATION CONSULT NOTE - Follow Up Consult  Pharmacy Consult for Lovenox  Indication: valve  Allergies  Allergen Reactions  . Ace Inhibitors     REACTION: cough  . Primaxin (Imipenem W/Cilastatin Sodium)     rash    Patient Measurements: Height: 5\' 11"  (180.3 cm) (Entered for Cutover) Weight: 191 lb 2.2 oz (86.7 kg) IBW/kg (Calculated) : 75.3    Vital Signs: Temp: 98.4 F (36.9 C) (11/05 0629) Temp src: Oral (11/05 0629) BP: 168/63 mmHg (11/05 0629) Pulse Rate: 68  (11/05 0629)  Labs:  Basename 12/03/10 0953 12/02/10 0540 12/01/10 0500  HGB -- 9.1* 8.8*  HCT -- 27.7* 26.5*  PLT -- 221 212  APTT -- -- --  LABPROT 18.1* 19.6* 21.6*  INR 1.47 1.63* 1.84*  HEPARINUNFRC -- -- --  CREATININE -- -- --  CKTOTAL -- -- --  CKMB -- -- --  TROPONINI -- -- --   Estimated Creatinine Clearance: 107.2 ml/min (by C-G formula based on Cr of 0.72).   Medications:  Scheduled:    . ALPRAZolam  0.5 mg Oral Daily  . docusate sodium  100 mg Oral QODAY  . feeding supplement  237 mL Oral BID BM  . fluticasone  2 spray Each Nare Daily  . furosemide  20 mg Oral Daily  . lipase/protease/amylase  2 capsule Oral TID WC  . nebivolol  5 mg Oral Daily  . pantoprazole  40 mg Oral BID AC  . potassium chloride  20 mEq Oral Daily  . DISCONTD: enoxaparin (LOVENOX) injection  80 mg Subcutaneous Q12H  . DISCONTD: lipase/protease/amylase  2 capsule Oral TID WC  . DISCONTD: pantoprazole  40 mg Oral BID AC    Assessment: 60 year old on Coumadin PTA for valve. Coumadin currently on hold pending pancreatic cyst drainage today Goal of Therapy:  appropriate Lovenox Dosing   Plan:  Lovenox 80 mg sq q12 F/U for Coumadin restart  Elwin Sleight 12/03/2010,11:26 AM

## 2010-12-03 NOTE — Procedures (Signed)
Procedure:  CT guided drainage of abdominal psueocyst/abscess  Findings:  Purulent fluid.  Sent for culture.  12 Fr pigtail drain placed to suction bulb.  Complications:  None  Plan:  Follow output.  Flush tube q8 hours.

## 2010-12-03 NOTE — Progress Notes (Signed)
  [facility] Progress Note  Subjective: Feels okay, no abdominal pain, awaiting IR procedure  Objective: Vital signs in last 24 hours: Temp:  [98 F (36.7 C)-99.3 F (37.4 C)] 98.4 F (36.9 C) (11/05 0629) Pulse Rate:  [68-91] 68  (11/05 0629) Resp:  [18] 18  (11/05 0629) BP: (107-168)/(63-72) 168/63 mmHg (11/05 0629) SpO2:  [95 %-97 %] 97 % (11/05 0629) Last BM Date: 12/02/10 General:   Alert,  Well-developed, well-nourished, pleasant and cooperative in NAD Heart:  Regular rate and rhythm; aortic valve click. Abdomen:  Soft, nontender and nondistended.noted. Normal bowel sounds, without guarding, and without rebound.   Extremities:  1-2+ bliateral lower extremity edema.Neurologic:  Alert and  oriented x4;  grossly normal neurologically. Skin:   Still has some erythematous striations on both lower extremities between feet and knees.  Psych:  Alert and cooperative. Normal mood and affect.  Lab Results:  Basename 12/02/10 0540 12/01/10 0500  WBC 6.7 6.3  HGB 9.1* 8.8*  HCT 27.7* 26.5*  PLT 221 212   BMET No results found for this basename: NA:3,K:3,CL:3,CO2:3,GLUCOSE:3,BUN:3,CREATININE:3,CALCIUM:3 in the last 72 hours LFT No results found for this basename: PROT,ALBUMIN,AST,ALT,ALKPHOS,BILITOT,BILIDIR,IBILI in the last 72 hours PT/INR  Basename 12/03/10 0953 12/02/10 0540  LABPROT 18.1* 19.6*  INR 1.47 1.63*   Assessment / Plan: 1. Large infected pseudocyst with fistulization to duodenum. To Interventional Radiology today for drainage since INR down now. Will need to send fluid for Amylase, cell count, gram stain and cytology. 2. Low grade temp, resolved. 3.  AVR, Coumadin on hold. INR 1.47 5. Anemia, recent 4gm drop in hemoglobin. EGD negative except for fistula in duodenum. May need eventual colonoscopy. Hemoglobin stable. 6. Mild decreased breath sound RML and RLL, small pleural effusion with elevation of right hemidiaphragm from pseudocyst seen on scan  few days ago. Trial of incentive spirometry for lung expansion. 7. Peripheral edema, improving with addition of daily Lasix.  8. Mild bilateral lower extremity rash, watch for now.    LOS: 5 days   Willette Cluster  12/03/2010, 11:08 AM

## 2010-12-03 NOTE — Progress Notes (Signed)
Patient seen and I agree with the above documentation, including the assessment and plan. S/P IR perc drainage of large pseudocyst.  Too soon to know if truly infected.  Sent for culture, and other tests (amylase, cytology) Anemia stable after transfusion.  Did have heme + stools, but would not pursue colonoscopy at this time.  This can be addressed likely as an outpt.

## 2010-12-04 ENCOUNTER — Telehealth: Payer: Self-pay | Admitting: Gastroenterology

## 2010-12-04 DIAGNOSIS — K863 Pseudocyst of pancreas: Secondary | ICD-10-CM

## 2010-12-04 DIAGNOSIS — Z954 Presence of other heart-valve replacement: Secondary | ICD-10-CM

## 2010-12-04 DIAGNOSIS — K862 Cyst of pancreas: Secondary | ICD-10-CM

## 2010-12-04 DIAGNOSIS — K651 Peritoneal abscess: Secondary | ICD-10-CM

## 2010-12-04 DIAGNOSIS — K316 Fistula of stomach and duodenum: Secondary | ICD-10-CM | POA: Diagnosis present

## 2010-12-04 DIAGNOSIS — D62 Acute posthemorrhagic anemia: Secondary | ICD-10-CM

## 2010-12-04 LAB — URINE CULTURE

## 2010-12-04 MED ORDER — ENOXAPARIN SODIUM 100 MG/ML ~~LOC~~ SOLN
85.0000 mg | SUBCUTANEOUS | Status: AC
  Administered 2010-12-04: 85 mg via SUBCUTANEOUS
  Filled 2010-12-04: qty 1

## 2010-12-04 MED ORDER — PANCRELIPASE (LIP-PROT-AMYL) 12000-38000 UNITS PO CPEP
2.0000 | ORAL_CAPSULE | Freq: Three times a day (TID) | ORAL | Status: DC
  Filled 2010-12-04 (×2): qty 2

## 2010-12-04 MED ORDER — ENOXAPARIN SODIUM 100 MG/ML ~~LOC~~ SOLN
85.0000 mg | Freq: Two times a day (BID) | SUBCUTANEOUS | Status: DC
  Administered 2010-12-04 – 2010-12-07 (×5): 85 mg via SUBCUTANEOUS
  Filled 2010-12-04 (×7): qty 1

## 2010-12-04 NOTE — Progress Notes (Signed)
  Subjective:Mild discomfort at drain site when moving or coughing. Otherwise feels well.  Objective: Vital signs in last 24 hours: Temp:  [97.4 F (36.3 C)-98.4 F (36.9 C)] 97.6 F (36.4 C) (11/06 1041) Pulse Rate:  [68-78] 71  (11/06 1041) Resp:  [18-25] 18  (11/06 1041) BP: (88-118)/(45-75) 107/69 mmHg (11/06 1041) SpO2:  [95 %-100 %] 99 % (11/06 1041) Last BM Date: 12/03/10  Intake/Output from previous day: 11/05 0701 - 11/06 0700 In: 360 [P.O.:360] Out: 876 [Drains:736] Intake/Output this shift: Total I/O In: -  Out: 330 [Drains:330]  Drain site is clean and dry. New dressing just placed.  Lab Results:   [facility] 12/02/10 0540  WBC 6.7  HGB 9.1*  HCT 27.7*  PLT 221   BMET No results found for this basename: NA:2,K:2,CL:2,CO2:2,GLUCOSE:2,BUN:2,CREATININE:2,CALCIUM:2 in the last 72 hours PT/INR  Basename 12/04/10 0615 12/03/10 0953  LABPROT 18.3* 18.1*  INR 1.49 1.47   ABG No results found for this basename: PHART:2,PCO2:2,PO2:2,HCO3:2 in the last 72 hours  Studies/Results: Ct Guided Abscess Drain  12/03/2010  *RADIOLOGY REPORT*  Clinical Data: History of pancreatitis and pseudocyst formation. 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  CT GUIDED DRAINAGE OF PERITONEAL ABSCESS  Sedation:  1.0 mg IV Versed  Total Moderate Sedation Time: 30 minutes.  Procedure:  The procedure, risks, benefits, and alternatives were explained to the patient.  Questions regarding the procedure were encouraged and answered. The patient understands and consents to the procedure.  The right abdominal wall was prepped with betadine in a sterile fashion, and a sterile drape was applied covering the operative field.  A sterile gown and sterile gloves were used for the procedure. Local anesthesia was provided with 1% Lidocaine.  Under CT guidance, an 18 gauge needle was advanced into the right perihepatic abscess fluid collection.  Fluid was aspirated  and a sample sent for culture studies.  A guidewire was advanced into the collection.  Over a wire, the tract was dilated and a 12-French pigtail drainage catheter placed.  The catheter was flushed with saline and connected to a suction bulb.  It was secured at the skin with a Prolene retention suture and Stat-Lock device.  Complications: None  Findings: Aspirated fluid was grossly purulent.  A 12-French drain was advanced into the collection.  This is now draining well to a suction bulb.  Output will be followed.  IMPRESSION: CT guided percutaneous drainage of perihepatic abscess representing a pseudocyst with fistula formation to the duodenum.  Grossly purulent fluid was present and sent for culture studies.  A 12- French drain was placed.  Original Report Authenticated By: Reola Calkins, M.D.    Anti-infectives: Anti-infectives    None      Assessment/Plan: s/p Drain placement 12/03/2010 by Dr. Fredia Sorrow. for pancreatic pseudocyst  Continue to follow. Good output from drain - 736 mls out yesterday - 330 out today. Purulent.   LOS: 6 days    [name] [name] 12/04/2010

## 2010-12-04 NOTE — Telephone Encounter (Signed)
12-06-10 Procedure Cx.

## 2010-12-04 NOTE — Progress Notes (Signed)
RUQ dressing changed at 2350 12/04/2010, day RN had stated that dressing was not changed that day.  Dressing was dry and intact, sutures intact, small amount of redness around sutures.  No drainage around sutures.  Flushed with 10cc NS per MD order.  Will continue to monitor.  Angelique Blonder, RN

## 2010-12-04 NOTE — Progress Notes (Signed)
[facility] Progress Note  Subjective: Resting comfortably this am. No abdominal pain. Eating some, not excited about food choices.   Objective: Vital signs in last 24 hours: Temp:  [97.4 F (36.3 C)-98.4 F (36.9 C)] 97.6 F (36.4 C) (11/06 1041) Pulse Rate:  [68-76] 71  (11/06 1041) Resp:  [18-24] 18  (11/06 1041) BP: (88-118)/(45-75) 107/69 mmHg (11/06 1041) SpO2:  [95 %-100 %] 99 % (11/06 1041) Last BM Date: 12/03/10 General:   pleasant and cooperative in NAD Heart:  Regular rate and rhythm; aortic valve click.. Abdomen:  Soft, nontender and nondistended.  Normal bowel sounds, without guarding, and without rebound.   Extremities:  2+ bilateral lower extremity edema. Neurologic:  Alert and  oriented x4;  grossly normal neurologically. Skin:  Intact without significant lesions or rashes.  Intake/Output from previous day: 11/05 0701 - 11/06 0700 In: 360 [P.O.:360] Out: 876 [Drains:736] Intake/Output this shift: Total I/O In: -  Out: 420 [Drains:420]  Lab Results:  [facility] 12/02/10 0540  WBC 6.7  HGB 9.1*  HCT 27.7*  PLT 221   BMET No results found for this basename: NA:3,K:3,CL:3,CO2:3,GLUCOSE:3,BUN:3,CREATININE:3,CALCIUM:3 in the last 72 hours LFT No results found for this basename: PROT,ALBUMIN,AST,ALT,ALKPHOS,BILITOT,BILIDIR,IBILI in the last 72 hours PT/INR  Basename 12/04/10 0615 12/03/10 0953  LABPROT 18.3* 18.1*  INR 1.49 1.47    Studies/Results: Ct Guided Abscess Drain  12/03/2010  *RADIOLOGY REPORT*  Clinical Data: History of pancreatitis and pseudocyst formation. 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  CT GUIDED DRAINAGE OF PERITONEAL ABSCESS  Sedation:  1.0 mg IV Versed  Total Moderate Sedation Time: 30 minutes.  Procedure:  The procedure, risks, benefits, and alternatives were explained to the patient.  Questions regarding the procedure were encouraged and answered. The patient understands and  consents to the procedure.  The right abdominal wall was prepped with betadine in a sterile fashion, and a sterile drape was applied covering the operative field.  A sterile gown and sterile gloves were used for the procedure. Local anesthesia was provided with 1% Lidocaine.  Under CT guidance, an 18 gauge needle was advanced into the right perihepatic abscess fluid collection.  Fluid was aspirated and a sample sent for culture studies.  A guidewire was advanced into the collection.  Over a wire, the tract was dilated and a 12-French pigtail drainage catheter placed.  The catheter was flushed with saline and connected to a suction bulb.  It was secured at the skin with a Prolene retention suture and Stat-Lock device.  Complications: None  Findings: Aspirated fluid was grossly purulent.  A 12-French drain was advanced into the collection.  This is now draining well to a suction bulb.  Output will be followed.  IMPRESSION: CT guided percutaneous drainage of perihepatic abscess representing a pseudocyst with fistula formation to the duodenum.  Grossly purulent fluid was present and sent for culture studies.  A 12- French drain was placed.  Original Report Authenticated By: Reola Calkins, M.D.    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 S/p drain place by Interventional Radiology yesterday, gram stain shows gram positive cocci in pairs and gram negative rods. Drainage is extremely malodorous but patient is afebrile, does not appear toxic. He needs antibiotics, I think Infectious Disease should evaluate. 2. Low grade temp, resolved.  2. AVR, Coumadin on hold. INR 1.49. Patient's Lovenox was not restarted after IR procedure yesterday. I called pharmacy and they will reorder Lovenox now. Will continue  to hold Coumadin until sure he will not need any other procedures.  3. Anemia, recent 4gm drop in hemoglobin. EGD negative except for fistula in duodenum. May need eventual  colonoscopy. Hemoglobin stable.  5. Mild decreased breath sound RML and RLL, small pleural effusion with elevation of right hemidiaphragm from pseudocyst seen on scan few days ago. Trial of incentive spirometry for lung expansion.  6. Peripheral edema, improving with addition of daily Lasix.  7. Mild bilateral lower extremity rash, resolving.     LOS: 6 days   Willette Cluster  12/04/2010, 12:44 PM

## 2010-12-04 NOTE — Plan of Care (Signed)
Problem: Consults Goal: General Medical Patient Education See Patient Education Module for specific education.  Pt oriented to unit, routines and function of JP drain

## 2010-12-04 NOTE — Consult Note (Signed)
[name] [name] May 20, 1950  [id number].   Requesting MD: Dr. Rhea Belton  Patient Care Team: Joaquim Nam, MD as PCP - General (Family Medicine) Clovis Pu. 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   He has been followed by Dr. Luisa Hart in our office for the last 12 months. He was having serial CT scans to follow the pseudocyst. His most recent CT scan revealed a shrinking retropancreatic pseudocyst. The patient was actually supposed to followup with Dr. Luisa Hart this past Friday; however, the patient was admitted to the hospital secondary to requiring an esophageal dilatation and finding that he had a 4 g drop in his hemoglobin. Upon endoscopy, the patient was noted to have what appeared to be a fistula in the duodenum. He then had a CT scan which revealed a new giant right sub-diaphragmatic collection with a fistulous communication with the second portion of the duodenum. The pseudocyst that Dr. Luisa Hart had been following had now become stable and very small. This was obviously a new fluid collection with gas at the duodenal bulb & large gas pocket in the fluid collection suspicious for an abscess w fistulous connection to the duodenum..  The patient denied any fevers at home. He did occasionally have some chills. He did admit to a decrease in his appetite since Labor Day. He had lost approximately 20 pounds according to his wife. This fluid collection has been drained by interventional radiology this admission. Since then he feels like his appetite has begun to pick back up. His cultures are still currently pending for this new fluid collection that has been drained. We have been asked to evaluate the patient for further  surgical recommendations.  Review of systems: Please see history of present illness otherwise all other systems have been reviewed and are negative.    Family History  Problem Relation Age of Onset  . Obesity Mother     bedridden  . Hypertension Mother   . Heart disease Father     CABG, CAD  . Hypertension Father   . Diabetes Father   . Lymphoma Father   . Cancer Father     lymphoma  . Alcohol abuse Brother   . Colon cancer Neg Hx     Past Medical History  Diagnosis Date  . Aortic insufficiency     with bicuspid aortic valve (the last MRI demonstrated the aortic root to be 4.8 x 4.7 cm.) Mod regurgitation. Normal chamber size with very mild left ventricular dysfunction.)  . Dyslipidemia   . Duodenal ulcer     with GI bleeding  . Hypertension   . Pericardial effusion 07/2009    admission to [facility], s/p pericardial window  . PNA (pneumonia) 10/2009    admnission to [facility], with right pleural effusion, s/p thoracentesis  . 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 Syncope 08/2009    admission for fever and syncope, no source for fever seen on cultures. Syncope thought to be r/t orthostasis  . Pleural effusion 10/2009    s/p thoracentesis  . Cough 06/28/2009    Dr. Sherene Sires  . GERD (gastroesophageal reflux disease) 03/23/2001    egd w/ dilation...Marland KitchenMarland KitchenArlyce Dice  . Acute pancreatitis     with pancreatic necrosis  . GI bleed  Gastritis, ? Chem, Working for [employer]  . CHF (congestive heart failure)   . Orthostasis 08/2009    Fever and syncope, no source for fever on cultures.    Past Surgical History  Procedure Date  . Esophagogastroduodenoscopy 1990, 1999    Hot dog in throat (1987)/ Chicken in throat (1990)/ 01/06/97 (Dr. Kinnie Scales) Impaction, dilatation, Schatzki's Ring/ 07/1998 repeat Dilatation/ 01/10/01 stricture/dilated/ 03/08/05 Schatzki's Ring, dilated/ 06/01/08 Food Disimpaction [name] Bulb Ulcer (Dr. Ewing Schlein)  .  Esophagogastroduodenoscopy 01/06/1997    (Dr. Kinnie Scales) Impaction, dilatation, Schatzki's Ring  . Valve replacement 06/2009    St. Jude mechanical valve per Dr. Laneta Simmers  . Doppler echocardiography 01/18/97    LVH, mild dilated aorta - root mod to severe aortic regurg EF 50-55%  . Esophagogastroduodenoscopy 08/09/98    Repeat dilatation [name])  . Esophagogastroduodenoscopy 01/13/2001    stricture/ HH, duodenal ulcer / bleeding Marina Goodell)  . Esophagogastroduodenoscopy 12/25/01    esophagus stricture, dilated  . Esophagogastroduodenoscopy 03/23/2001    stricture, dilated  . Cardiolite ekg 03/10/2003    (-) ? small/apical infarct EF 50%  . Mri 2/05    LVH global hypokin EF 48% Mod A.I.  . Doppler echocardiography 09/27/03    No changes, moderate severe aortic regurg  . Doppler echocardiography 07/09/2004    Mod severe aortic regurg 2-3+ T.R., T.I.R., mild P..R.  . Esophagogastroduodenoscopy 03/08/05    Schatzki's Ring   03/13/05  Schatzki's Ring - dilated, duodenitis  . Doppler echocardiography 04/01/06    Hypokin Apex EF 55%  . Esophagogastroduodenoscopy 06/11/08    with food disimpaction.  Food impaction Ms HH  Sm bulb ulcer (Dr. Ewing Schlein)  . Doppler echocardiography 02/15/2010    Mild LVH, mild decr Sys fctn EF 40-45% Mild Dias Dysfctn Mech AV Triv AR, MR  . Hernia repair   . Esophagogastroduodenoscopy     stricture/HH, duodenal ulcer/bleeding Marina Goodell)    Social History:  reports that he has never smoked. He has never used smokeless tobacco. He reports that he does not drink alcohol or use illicit drugs.  He is now disabled.  Allergies:  Allergies  Allergen Reactions  . Ace Inhibitors     REACTION: cough  . Primaxin (Imipenem W/Cilastatin Sodium)     rash    Medications Prior to Admission  Medication Dose Route Frequency Provider Last Rate Last Dose  . ALPRAZolam Prudy Feeler) tablet 0.5 mg  0.5 mg Oral Daily Louis Meckel, MD   0.5 mg at 12/04/10 1046  . ALPRAZolam Prudy Feeler) tablet 0.5 mg   0.5 mg Oral QHS PRN Louis Meckel, MD      . dextromethorphan (DELSYM) 30 MG/5ML liquid 30 mg  30 mg Oral Q6H PRN Erick Blinks, MD      . dextrose 5 %-0.45 % sodium chloride infusion   Intravenous Continuous Louis Meckel, MD 50 mL/hr at 12/03/10 1838    . diphenhydrAMINE (BENADRYL) capsule 25 mg  25 mg Oral Q6H PRN Louis Meckel, MD   25 mg at 12/02/10 2118  . docusate sodium (COLACE) capsule 100 mg  100 mg Oral QODAY Louis Meckel, MD   100 mg at 12/03/10 1000  . enoxaparin (LOVENOX) injection 85 mg  85 mg Subcutaneous NOW Elwin Sleight, PHARMD   85 mg at 12/04/10 1405  . enoxaparin (LOVENOX) injection 85 mg  85 mg Subcutaneous Q12H Elwin Sleight, PHARMD      . feeding supplement (ENSURE CLINICAL STRENGTH) liquid 237 mL  237 mL Oral  BID BM Louis Meckel, MD   237 mL at 12/04/10 1535  . fentaNYL (SUBLIMAZE) 0.05 MG/ML injection           . fluticasone (FLONASE) 50 MCG/ACT nasal spray 2 spray  2 spray Each Nare Daily Louis Meckel, MD   2 spray at 12/04/10 1047  . furosemide (LASIX) tablet 20 mg  20 mg Oral Daily Louis Meckel, MD   20 mg at 12/04/10 1047  . hydrocortisone 1 % cream   Topical Q3H PRN Louis Meckel, MD      . lipase/protease/amylase (CREON-10/PANCREASE) capsule 2 capsule  2 capsule Oral TID WC Louis Meckel, MD   2 capsule at 12/04/10 1200  . midazolam (VERSED) 2 MG/2ML injection           . morphine 4 MG/ML injection 2 mg  2 mg Intravenous Q4H PRN Louis Meckel, MD   2 mg at 12/03/10 2128  . nebivolol (BYSTOLIC) tablet 5 mg  5 mg Oral Daily Louis Meckel, MD   5 mg at 12/04/10 1047  . ondansetron (ZOFRAN) injection 4 mg  4 mg Intravenous Q6H PRN Louis Meckel, MD      . pantoprazole (PROTONIX) EC tablet 40 mg  40 mg Oral BID AC Louis Meckel, MD   40 mg at 12/04/10 1535  . potassium chloride SA (K-DUR,KLOR-CON) CR tablet 20 mEq  20 mEq Oral Daily Louis Meckel, MD   20 mEq at 12/04/10 1047  . traMADol (ULTRAM) tablet 50-100 mg  50-100 mg Oral Q6H PRN  [place] R Smith, PHARMD   100 mg at 12/04/10 1535  . zolpidem (AMBIEN) tablet 5 mg  5 mg Oral QHS PRN Louis Meckel, MD      . DISCONTD: enoxaparin (LOVENOX) injection 80 mg  80 mg Subcutaneous Q12H Louis Meckel, MD   80 mg at 12/02/10 0901  . DISCONTD: furosemide (LASIX) tablet 20 mg  20 mg Oral Daily PRN Louis Meckel, MD      . DISCONTD: lipase/protease/amylase (CREON-10/PANCREASE) capsule 2 capsule  2 capsule Oral TID WC Louis Meckel, MD   2 capsule at 12/03/10 0700  . DISCONTD: lipase/protease/amylase (CREON-10/PANCREASE) capsule 2 capsule  2 capsule Oral TID WC Samella Parr, PHARMD      . DISCONTD: morphine 2 MG/ML injection 2 mg  2 mg Intravenous Q4H PRN Erick Blinks, MD      . DISCONTD: pantoprazole (PROTONIX) EC tablet 40 mg  40 mg Oral BID AC Louis Meckel, MD   40 mg at 12/02/10 1504  . DISCONTD: traMADol (ULTRAM) tablet 50 mg  50 mg Oral Q4H PRN Louis Meckel, MD       Medications Prior to Admission  Medication Sig Dispense Refill  . ALPRAZolam (XANAX) 0.5 MG tablet Take 0.5 mg by mouth at bedtime as needed. For sleep/anxiety      . chlorpheniramine (CHLOR-TRIMETON) 4 MG tablet Take 4 mg by mouth at bedtime as needed. For allergy symptoms      . dextromethorphan (DELSYM) 30 MG/5ML liquid Take 60 mg by mouth as needed. For cough      . docusate sodium (COLACE) 100 MG capsule Take 100 mg by mouth every other day.       . enoxaparin (LOVENOX) 80 MG/0.8ML SOLN Inject 0.8 mLs (80 mg total) into the skin every 12 (twelve) hours.  10 Syringe  1  . famotidine (PEPCID) 20 MG tablet Take 20 mg by  mouth at bedtime. One at bedtime       . ferrous sulfate 324 (65 FE) MG TBEC Take 1 tablet by mouth 2 (two) times daily.       . fluticasone (FLONASE) 50 MCG/ACT nasal spray Place 2 sprays into the nose daily as needed. For congestion       . furosemide (LASIX) 20 MG tablet Take 20 mg by mouth daily as needed. For feet swelling      . nebivolol (BYSTOLIC) 5 MG tablet Take 1 tablet (5 mg  total) by mouth daily.  30 tablet  11  . Pancrelipase, Lip-Prot-Amyl, 6000 UNITS CPEP Take 1-2 capsules by mouth 3 (three) times daily before meals. 2 capsules in the morning; 1 capsule with lunch and dinner       . pantoprazole (PROTONIX) 40 MG tablet Take 40 mg by mouth 2 (two) times daily. Take 30- 60 min before your first and last meals of the day       . potassium chloride SA (K-DUR,KLOR-CON) 20 MEQ tablet Take 1 tablet (20 mEq total) by mouth daily.  30 tablet  6  . traMADol (ULTRAM) 50 MG tablet Take 1-2 tablets every 4-6 hours as needed for cough  120 tablet  1  . warfarin (COUMADIN) 1 MG tablet Take 1-2 mg by mouth daily. Take as directed by Anticoagulation clinic; 1 tablet every day of the week except Tuesday take 2 tablets       . zolpidem (AMBIEN) 5 MG tablet Take 1 tablet (5 mg total) by mouth at bedtime as needed for sleep.  30 tablet  3    Blood pressure 93/56, pulse 69, temperature 98 F (36.7 C), temperature source Oral, resp. rate 18, height 5\' 11"  (1.803 m), weight 191 lb 2.2 oz (86.7 kg), SpO2 96.00%. Physical exam: HEENT: Head is normocephalic, atraumatic. Sclera are non-injected. Pupils are equal, round, and reactive to light. Ears and nose without any obvious masses or lesions. His mouth is pink and throat shows no exudate. Heart: Regular, rate, rhythm. Normal S1-S2. There are no murmurs, gallops, rubs. He does have a click from his heart valve. Palpable carotid, radial, pedal pulses bilaterally. Lungs: Clear to auscultation bilaterally with no wheezes, rhonchi, rails. Respiratory effort is nonlabored. Abdomen: Soft, nontender, nondistended, active bowel sounds. He has a JP drain in the right upper quadrant. This is draining purulent, foul-smelling discharge. This appears to be cloudy tan in nature with a slight greenish tent. No other masses, hernias, organomegaly are noted. Musculoskeletal: All 4 extremities are symmetrical. No cyanosis, clubbing. He does have bilateral trace  lower extremity edema. Skin: Warm and dry with no masses, lesions, rashes. Eyes: PERRL, normal EOM. Neuro: CN II-XII intact w/o focal sensory/motor deficits.  Lymph: No head/neck/groin lymphadenopathy Psych:  No delerium/psychosis/paranoia.  Pt awake/alert/oriented x4 in no major acute distress Neck: Supple, No tracheal deviation Skin: No petechiae / purpurae   Results for orders placed during the hospital encounter of 11/28/10 (from the past 48 hour(s))  PROTIME-INR     Status: Abnormal   Collection Time   12/03/10  9:53 AM      Component Value Range Comment   Prothrombin Time 18.1 (*) 11.6 - 15.2 (seconds)    INR 1.47  0.00 - 1.49    PROTIME-INR     Status: Abnormal   Collection Time   12/04/10  6:15 AM      Component Value Range Comment   Prothrombin Time 18.3 (*) 11.6 - 15.2 (seconds)  INR 1.49  0.00 - 1.49     Ct Guided Abscess Drain  12/03/2010  *RADIOLOGY REPORT*  Clinical Data: History of pancreatitis and pseudocyst formation. 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  CT GUIDED DRAINAGE OF PERITONEAL ABSCESS  Sedation:  1.0 mg IV Versed  Total Moderate Sedation Time: 30 minutes.  Procedure:  The procedure, risks, benefits, and alternatives were explained to the patient.  Questions regarding the procedure were encouraged and answered. The patient understands and consents to the procedure.  The right abdominal wall was prepped with betadine in a sterile fashion, and a sterile drape was applied covering the operative field.  A sterile gown and sterile gloves were used for the procedure. Local anesthesia was provided with 1% Lidocaine.  Under CT guidance, an 18 gauge needle was advanced into the right perihepatic abscess fluid collection.  Fluid was aspirated and a sample sent for culture studies.  A guidewire was advanced into the collection.  Over a wire, the tract was dilated and a 12-French pigtail drainage catheter placed.  The catheter was  flushed with saline and connected to a suction bulb.  It was secured at the skin with a Prolene retention suture and Stat-Lock device.  Complications: None  Findings: Aspirated fluid was grossly purulent.  A 12-French drain was advanced into the collection.  This is now draining well to a suction bulb.  Output will be followed.  IMPRESSION: CT guided percutaneous drainage of perihepatic abscess representing a pseudocyst with fistula formation to the duodenum.  Grossly purulent fluid was present and sent for culture studies.  A 12- French drain was placed.  Original Report Authenticated By: Reola Calkins, M.D.       Assessment/Plan 1. 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 No definite connection to the pancreas. 2. Pancreatic pseudocyst, almost resolved. 3. Esophageal stricture status post dilatation. 4. History of pancreatitis, unknown etiology 5. GERD 6. Atrial fibrillation on chronic warfarin anticoagulation 7. History of congestive heart failure  Plan: The subdiaphragmatic abscess with fistulous communication to the second portion of the duodenum is a separate fluid collection from the prior pancreatic pseudocyst the patient had been followed for in our office. There is no definite connection on the patient's CT scan to completely say that this abscess has any connection to the pancreas. That is not to say that it does not; however, this is unable to be determined at this point.   We agree with percutaneous drainage.  Add to wall suction to facilitate collapse of the collection.  Repeat CT or drain study later.  Consider an Upper GI gastrografin swallow vs drain study to see if this collection does if fact communicate with the duodenum as suspected.  Correctly controlled with internal drainage.  Let the drainage calm down 1st  We agree with IV antibiotics.  We will await the culture results to be able to specify what type of antibiotic  will best treat this. The patient is allergic to Primaxin which is a good antibiotic of choice for the pancreas; however, since we are not sure this is connected to the pancreas and the patient is allergic we will wait cultures for further recommendations. The gastroenterology team has consulted infectious disease which is appropriate for antibiotic choice.   In the meantime, we will recommend seeing if the patient will improve with nonoperative management including his percutaneous drain. If he does not he may need some type of surgical intervention.  However, unroofing the cavity like a liver abscess with washout would make the likelihood of duodenal fistula high.  Things are contained & the patient is better clinically.  Follow for now    The old pancreatic pseudocyst has nearly resolved & requires no surgical intervention.    We will follow the patient along with you. Thank you for this consultation.  OSBORNE,KELLY E 12/04/2010, 4:19 PM

## 2010-12-05 ENCOUNTER — Encounter (HOSPITAL_COMMUNITY): Payer: Self-pay | Admitting: General Practice

## 2010-12-05 LAB — CBC
HCT: 28.4 % — ABNORMAL LOW (ref 39.0–52.0)
Hemoglobin: 9.3 g/dL — ABNORMAL LOW (ref 13.0–17.0)
MCH: 28.1 pg (ref 26.0–34.0)
MCHC: 32.7 g/dL (ref 30.0–36.0)
RBC: 3.31 MIL/uL — ABNORMAL LOW (ref 4.22–5.81)

## 2010-12-05 LAB — PROTIME-INR: Prothrombin Time: 17.2 seconds — ABNORMAL HIGH (ref 11.6–15.2)

## 2010-12-05 MED ORDER — FLEET ENEMA 7-19 GM/118ML RE ENEM
1.0000 | ENEMA | Freq: Once | RECTAL | Status: AC
  Administered 2010-12-05: 1 via RECTAL
  Filled 2010-12-05: qty 1

## 2010-12-05 MED ORDER — SODIUM CHLORIDE 0.9 % IV SOLN
Freq: Once | INTRAVENOUS | Status: AC
  Administered 2010-12-05: 22:00:00 via INTRAVENOUS

## 2010-12-05 MED ORDER — PANTOPRAZOLE SODIUM 40 MG PO TBEC
40.0000 mg | DELAYED_RELEASE_TABLET | Freq: Every day | ORAL | Status: DC
  Administered 2010-12-05 – 2010-12-07 (×3): 40 mg via ORAL
  Filled 2010-12-05 (×2): qty 1

## 2010-12-05 MED ORDER — CIPROFLOXACIN IN D5W 400 MG/200ML IV SOLN
400.0000 mg | Freq: Two times a day (BID) | INTRAVENOUS | Status: DC
  Administered 2010-12-05 (×2): 400 mg via INTRAVENOUS
  Filled 2010-12-05 (×3): qty 200

## 2010-12-05 MED ORDER — METRONIDAZOLE IN NACL 5-0.79 MG/ML-% IV SOLN
500.0000 mg | Freq: Four times a day (QID) | INTRAVENOUS | Status: DC
  Administered 2010-12-05 – 2010-12-06 (×4): 500 mg via INTRAVENOUS
  Filled 2010-12-05 (×6): qty 100

## 2010-12-05 NOTE — Progress Notes (Addendum)
  Subjective: Pt without complaints.   Slight soreness in RUQ where drain is in place.  returned from drain!  RNs could not figure out how to place to wall suction - just continued bulb suction.  Objective: Vital signs in last 24 hours: Temp:  [97.6 F (36.4 C)-98 F (36.7 C)] 97.8 F (36.6 C) (11/07 0513) Pulse Rate:  [68-75] 68  (11/07 0513) Resp:  [18-20] 20  (11/07 0513) BP: (93-107)/(56-69) 107/67 mmHg (11/07 0513) SpO2:  [93 %-99 %] 97 % (11/07 0513) Last BM Date: 12/04/10  Intake/Output from previous day: 11/06 0701 - 11/07 0700 In: 1455 [P.O.:480; I.V.:975] Out: 1710 [Urine:400; Drains:1310] Intake/Output this shift:    PE: Abd: soft, NT, ND, +BS, drain now without and purulence.  Now with a serous/bilious output.  Still with foul smell General: Pt awake/alert/oriented x4 in no major acute distress Eyes: PERRL, normal EOM. Neuro: CN II-XII intact w/o focal sensory/motor deficits. Lymph: No head/neck/groin lymphadenopathy Psych:  No delerium/psychosis/paranoia HEENT: Normocephalic, Mucus membranes moist.  No thrush Neck: Supple, No tracheal deviation Chest: No pain w good excursion. CTA bilateral CV:  Pulses intact.  Regular rhythm.   Ext:  SCDs BLE.  No mjr edema.  No cyanosis Skin: No petechiae / purpurae   Lab Results:  No results found for this basename: WBC:2,HGB:2,HCT:2,PLT:2 in the last 72 hours BMET No results found for this basename: NA:2,K:2,CL:2,CO2:2,GLUCOSE:2,BUN:2,CREATININE:2,CALCIUM:2 in the last 72 hours PT/INR  Basename 12/04/10 0615 12/03/10 0953  LABPROT 18.3* 18.1*  INR 1.49 1.47     Studies/Results:  Anti-infectives: Anti-infectives    None       Assessment/Plan 1. Subdiaphragmatic abscess with possible fistulous communication to duodenum 2. Pancreatic pseudocyst, essentially resolved  Plan:  1. Await cultures for Abx adjustment.   Would prefer Primaxin but given allergy would start with Cipro/Flagyl given purulent  drainage & increased WBC. 2. Continue with drain for now. Retry hemovac.  Ask Int Rad to help adapt to wall suction. 3.  As output decreases will need repeat CT scan with drain injection to confirm if main cavity has a fistula to the duodenum as suspected vs separate abscess.  If this does not show a leak then she will need UGI to make sure no documentation of fistula from the intestinal side.  Suspect there is a connection given ##gas in cavity after EGD.    LOS: 7 days    OSBORNE,KELLY E 12/05/2010  The patient is stable.  There is no evidence of peritonitis, acute abdomen, nor shock.  There is no strong evidence of failure of improvement nor decline with current non-operative management.  There is no need for surgery at the present moment.  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  Things as contained w drainage.  We will continue to follow.  Ardeth Sportsman, M.D., F.A.C.S. Gastrointestinal and Minimally Invasive Surgery [facility] [address], [address] [place], [place] [address] [phone] Main / Paging [phone] Voice Mail

## 2010-12-05 NOTE — Progress Notes (Signed)
Talked with pt.s nurse Pat,RN-states that Dr. Michaell Cowing wants [name] [name] to wall suction;no order for jp to suction noted.;paged Dr. Michaell Cowing and [facility] rtc and stated no to suction due to decrease in drg.  Leandrew Koyanagi Bernhardt Riemenschneider,RN

## 2010-12-05 NOTE — Progress Notes (Addendum)
       [facility] Daily Rounding Note 12/05/2010, 8:37 AM  SUBJECTIVE:  Drained 736 ml yest, 1310 ml today, no longer looks like pus. No abx on board yet. Eating better.  Walking ina halls.  No sign SOB.  LE edema is better OBJECTIVE:  Looks better.  Engaged. Non toxic  Vital signs in last 24 hours: Temp:  [97.6 F (36.4 C)-98 F (36.7 C)] 97.8 F (36.6 C) (11/07 0513) Pulse Rate:  [68-75] 68  (11/07 0513) Resp:  [18-20] 20  (11/07 0513) BP: (93-107)/(56-69) 107/67 mmHg (11/07 0513) SpO2:  [93 %-99 %] 97 % (11/07 0513) Last BM Date: 12/04/10 General:  Lookes better.  Engaged.  Not toxic. Heart:  RRR with valve click  Abdomen:  Soft, ND,NT.  JP drainage is clear, brownish color, sedimenteous but not purulent. bowel sounds: Active. Extremities: 2 p;us pedal ankle edema .  LE rash nearly gone. Neuro/Psych: non depressed.  Intake/Output from previous day: 11/06 0701 - 11/07 0700 In: 1455 [P.O.:480; I.V.:975] Out: 1710 [Urine:400; Drains:1310] Intake/Output this shift:    Lab Results:  Basename 12/05/10 0800  WBC 3.7*  HGB 9.3*  HCT 28.4*  PLT 236   BMET No results found for this basename: NA:3,K:3,CL:3,CO2:3,GLUCOSE:3,BUN:3,CREATININE:3,CALCIUM:3 in the last 72 hours LFT No results found for this basename: PROT,ALBUMIN,AST,ALT,ALKPHOS,BILITOT,BILIDIR,IBILI in the last 72 hours PT/INR  Basename 12/04/10 0615 12/03/10 0953  LABPROT 18.3* 18.1*  INR 1.49 1.47      Studies/Results:  ASSESMENT: 1   SP drainage of large panc pseudocyst. 2   Mechanical AVR  Coumadin on hold.  High dose Lovenox in place.    3   [place] anemia. Heme Pos.   EGD unrevealing as to source. Was for colonoscopy at office tomorrow.     ? Should it get done inpt before getting re-coumadinized? 4   Pancreatic/duodenal fistula.    PLAN: 1.  Surgery and ID to consult. 2.  Colonoscopy? Before discharge?   LOS: 7 days   Jennye Moccasin  12/05/2010, 8:37 AM   Addendum Patient seen and I agree  with the above documentation, including the assessment and plan. Large abscess now s/p drain placement.  [facility] to follow as outpt with repeat imaging and to better assess patency/for the presence of fistulous tract.   ID on board with recs for abx.  Following cultures. Plan at least 14 days of abx which may need to be lengthened depending on resolution of cystic cavity/surgical plan. Will defer colon to outpt setting with Dr. [name], as there is no active/overt GI bleeding, and HCT is stable. Cont q12h lovenox given AVR status

## 2010-12-05 NOTE — Progress Notes (Signed)
ANTICOAGULATION CONSULT NOTE - Follow Up Consult  Pharmacy Consult for Lovenox  Indication: Mechanical AVR  Allergies  Allergen Reactions  . Ace Inhibitors     REACTION: cough  . Primaxin (Imipenem W/Cilastatin Sodium)     rash    Patient Measurements: Height: 5\' 11"  (180.3 cm) (Entered for Cutover) Weight: 191 lb 2.2 oz (86.7 kg) IBW/kg (Calculated) : 75.3    Vital Signs: Temp: 97.8 F (36.6 C) (11/07 0513) Temp src: Oral (11/07 0513) BP: 107/67 mmHg (11/07 0513) Pulse Rate: 68  (11/07 0513)  Labs:  Basename 12/05/10 0800 12/04/10 0615 12/03/10 0953  HGB 9.3* -- --  HCT 28.4* -- --  PLT 236 -- --  APTT -- -- --  LABPROT 17.2* 18.3* 18.1*  INR 1.38 1.49 1.47  HEPARINUNFRC -- -- --  CREATININE -- -- --  CKTOTAL -- -- --  CKMB -- -- --  TROPONINI -- -- --   Estimated Creatinine Clearance: 107.2 ml/min (by C-G formula based on Cr of 0.72).   Medications:  Prescriptions prior to admission  Medication Sig Dispense Refill  . ALPRAZolam (XANAX) 0.5 MG tablet Take 0.5 mg by mouth at bedtime as needed. For sleep/anxiety      . chlorpheniramine (CHLOR-TRIMETON) 4 MG tablet Take 4 mg by mouth at bedtime as needed. For allergy symptoms      . dextromethorphan (DELSYM) 30 MG/5ML liquid Take 60 mg by mouth as needed. For cough      . docusate sodium (COLACE) 100 MG capsule Take 100 mg by mouth every other day.       . enoxaparin (LOVENOX) 80 MG/0.8ML SOLN Inject 0.8 mLs (80 mg total) into the skin every 12 (twelve) hours.  10 Syringe  1  . famotidine (PEPCID) 20 MG tablet Take 20 mg by mouth at bedtime. One at bedtime       . ferrous sulfate 324 (65 FE) MG TBEC Take 1 tablet by mouth 2 (two) times daily.       . fluticasone (FLONASE) 50 MCG/ACT nasal spray Place 2 sprays into the nose daily as needed. For congestion       . furosemide (LASIX) 20 MG tablet Take 20 mg by mouth daily as needed. For feet swelling      . nebivolol (BYSTOLIC) 5 MG tablet Take 1 tablet (5 mg total)  by mouth daily.  30 tablet  11  . Pancrelipase, Lip-Prot-Amyl, 6000 UNITS CPEP Take 1-2 capsules by mouth 3 (three) times daily before meals. 2 capsules in the morning; 1 capsule with lunch and dinner       . pantoprazole (PROTONIX) 40 MG tablet Take 40 mg by mouth 2 (two) times daily. Take 30- 60 min before your first and last meals of the day       . potassium chloride SA (K-DUR,KLOR-CON) 20 MEQ tablet Take 1 tablet (20 mEq total) by mouth daily.  30 tablet  6  . traMADol (ULTRAM) 50 MG tablet Take 1-2 tablets every 4-6 hours as needed for cough  120 tablet  1  . warfarin (COUMADIN) 1 MG tablet Take 1-2 mg by mouth daily. Take as directed by Anticoagulation clinic; 1 tablet every day of the week except Tuesday take 2 tablets       . zolpidem (AMBIEN) 5 MG tablet Take 1 tablet (5 mg total) by mouth at bedtime as needed for sleep.  30 tablet  3  . DISCONTD: famotidine (PEPCID) 20 MG tablet One at bedtime  30 tablet  11  . DISCONTD: fluticasone (FLONASE) 50 MCG/ACT nasal spray 2 sprays by Nasal route daily.  16 g  11  . DISCONTD: Pancrelipase, Lip-Prot-Amyl, (CREON) 6000 UNITS CPEP 1-2 tabs tid with meals  540 capsule  3  . DISCONTD: pantoprazole (PROTONIX) 40 MG tablet Take 30- 60 min before your first and last meals of the day      . DISCONTD: warfarin (COUMADIN) 1 MG tablet Take as directed by Anticoagulation clinic  60 tablet  3    Assessment: Patient on Coumadin PTA for mechanical AVR. Coumadin held this admit for drainage of pancreatic pseudocyst, which was done 11/6. Lovenox resumed post procedurally for possible plans colonoscopy/other procedures. CBC stable. Goal of Therapy:  INR 2-3, Anti Xa level 0.6-1.2 iu/ml    Plan:  Lovenox 85mg  q12h, no changes for now. Will f/up for resumption of Coumadin.   [name] [name] 12/05/2010,9:30 AM

## 2010-12-05 NOTE — Progress Notes (Signed)
Pt has order for wall suction for JP drain.  Parts were ordered from IR, but x3 RN were unable to fit pieces together appropriately.  JP is draining appropriately and flushes well with 10cc NS.  Will leave sticky note for MD, notify day RN, and continue to monitor.  [name] [name]

## 2010-12-05 NOTE — Consult Note (Signed)
Infectious Diseases Consultation  Reason for Consult:antibiotic recommendations for infected pancreatic pseudocyst Referring Physician: [name] is an 60 y.o. male.  white male with a history of hypertension, AVR with st. Jude valve c/b CHF, HLD,  GERD, and hx of pancreatitis c/b pancreatic pseudocyst in 2011. [name] [name] had a complicated, lengthy hospitalization roughly 1 year ago relating to pancreatitis c/b pseudocyst, pericardial effusion s/p window for drainage, and pleural effusion s/p pleurex catheter. He has been doing well for the past 10 months with exception to most recently having fatigue, early satiety, and some abdominal discomfort. He denies overt fever, but does subscribe to chills and nightsweats. He has had unintentional 25 lb over the last 6 months per his wife's report. He is followed routinely by his gastroenterologist who reviews  serial CT scans to follow the pseudocyst. His most recent CT scan revealed a shrinking retropancreatic pseudocyst. He recently underwent esophageal dilatation and incidentally found to have significant drop in HCT. AN EGD revelated a small fistula in the duodenum. A repeat CT scan now showed a new right sub-diaphragmatic collection with a fistulous communication with the second portion of the duodenum with gas at the duodenal bulb & large gas pocket in the fluid collection suspicious for an abscess w fistulous connection to the duodenum. The patient has undergone catheter placement for drainage for which polymicrobial positive culture. Since having the drain placed, the patient feels like his appetite is improving.   Past Medical History  Diagnosis Date  . Aortic insufficiency     with bicuspid aortic valve (the last MRI demonstrated the aortic root to be 4.8 x 4.7 cm.) Mod regurgitation. Normal chamber size with very mild left ventricular dysfunction.)  . Dyslipidemia   . Duodenal ulcer     with GI bleeding  . Hypertension   .  Pericardial effusion 07/2009    admission to [facility] 2, s/p pericardial window  . PNA (pneumonia) 10/2009    admnission to [facility], with right pleural effusion, s/p thoracentesis  . 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 Syncope 08/2009    admission for fever and syncope, no source for fever seen on cultures. Syncope thought to be r/t orthostasis  . Pleural effusion 10/2009    s/p thoracentesis  . Cough 06/28/2009    Dr. Sherene [name]  . GERD (gastroesophageal reflux disease) 03/23/2001    egd w/ dilation...Marland KitchenMarland KitchenArlyce [name]  . Acute pancreatitis     with pancreatic necrosis  . CHF (congestive heart failure)   . Orthostasis 08/2009    Fever and syncope, no source for fever on cultures.  [name] Heart murmur   . Shortness of breath     "w/exerction"  . Pneumonia 2011    "twice"  . Anemia   . Blood transfusion   . Hemorrhoids   . GI bleed     Gastritis, ? Chem, Working for [name] [name]    Past Surgical History  Procedure Date  . Esophagogastroduodenoscopy 1990, 1999    Hot dog in throat (1987)/ Chicken in throat (1990)/ 01/06/97 (Dr. Kinnie [name]) Impaction, dilatation, Schatzki's Ring/ 07/1998 repeat Dilatation/ 01/10/01 stricture/dilated/ 03/08/05 Schatzki's Ring, dilated/ 06/01/08 Food Disimpaction [name] Sm Bulb Ulcer (Dr. Ewing [name])  . Esophagogastroduodenoscopy 01/06/1997    (Dr. Kinnie [name]) Impaction, dilatation, Schatzki's Ring  . Valve replacement 06/2009    St. Jude mechanical valve per Dr. Laneta [name]  . Doppler echocardiography 01/18/97    LVH, mild dilated aorta - root mod to severe aortic  regurg EF 50-55%  . Esophagogastroduodenoscopy 08/09/98    Repeat dilatation [name] [name])  . Esophagogastroduodenoscopy 01/13/2001    stricture/ HH, duodenal ulcer / bleeding [name] [name])  . Esophagogastroduodenoscopy 12/25/01    esophagus stricture, dilated  . Esophagogastroduodenoscopy 03/23/2001    stricture, dilated  . Cardiolite ekg 03/10/2003    (-) ? small/apical infarct EF 50%  . Mri 2/05     LVH global hypokin EF 48% Mod A.I.  . Doppler echocardiography 09/27/03    No changes, moderate severe aortic regurg  . Doppler echocardiography 07/09/2004    Mod severe aortic regurg 2-3+ T.R., T.I.R., mild P..R.  . Esophagogastroduodenoscopy 03/08/05    Schatzki's Ring   03/13/05  Schatzki's Ring - dilated, duodenitis  . Doppler echocardiography 04/01/06    Hypokin Apex EF 55%  . Esophagogastroduodenoscopy 06/11/08    with food disimpaction.  Food impaction [name]  Sm bulb ulcer (Dr. Ewing [name])  . Doppler echocardiography 02/15/2010    Mild LVH, mild decr Sys fctn EF 40-45% Mild Dias Dysfctn Mech AV Triv AR, MR  . Esophagogastroduodenoscopy     stricture/HH, duodenal ulcer/bleeding [name] [name])  . Cardiac valve replacement 07/10/2009    AVR  . Hernia repair 2011    abdominal; post AVR    Family History  Problem Relation Age of Onset  . Obesity Mother     bedridden  . Hypertension Mother   . Heart disease Father     CABG, CAD  . Hypertension Father   . Diabetes Father   . Lymphoma Father   . Cancer Father     lymphoma  . Alcohol abuse Brother   . Colon cancer Neg Hx     Social History:  reports that he has never smoked. He has never used smokeless tobacco. He reports that he does not drink alcohol or use illicit drugs.  Allergies:  Ace inhibitor = cough, primaxin = rash  Medications:  reviewed  Review of Systems  Constitutional: Positive for chills, weight loss, malaise/fatigue and diaphoresis. Negative for fever.  HENT: Negative.   Eyes: Negative.   Respiratory: Negative.   Cardiovascular: Negative.   Gastrointestinal: Positive for abdominal pain. Negative for heartburn, diarrhea, constipation, blood in stool and melena.  Genitourinary: Negative for dysuria, urgency and frequency.  Musculoskeletal: Negative for myalgias, back pain and joint pain.  Skin: Negative.   Neurological: Negative.   Endo/Heme/Allergies: Negative.   Psychiatric/Behavioral: Negative.    Blood pressure  82/52, pulse 72, temperature 98.1 F (36.7 C), temperature source Oral, resp. rate 16, height 5\' 11"  (1.803 m), weight 86.7 kg (191 lb 2.2 oz), SpO2 97.00%. Physical Exam  Constitutional: He is oriented to person, place, and time. He appears well-developed. No distress.  HENT:  Head: Normocephalic and atraumatic.  Nose: Nose normal.  Mouth/Throat: Oropharynx is clear and moist. No oropharyngeal exudate.  Eyes: Conjunctivae and EOM are normal. Pupils are equal, round, and reactive to light. Right eye exhibits no discharge. Left eye exhibits no discharge. No scleral icterus.  Neck: No JVD present. No tracheal deviation present.  Cardiovascular: Normal rate and regular rhythm.   Murmur heard.      2/6 murmur with click c/w AVR  Respiratory: Breath sounds normal. No respiratory distress. He has no wheezes. He has no rales. He exhibits no tenderness.  GI: He exhibits no distension. There is no tenderness. There is no rebound and no guarding.       Drain in RUQ  Musculoskeletal: Normal range of motion. He exhibits no edema and  no tenderness.  Lymphadenopathy:    He has no cervical adenopathy.  Neurological: He is alert and oriented to person, place, and time. No cranial nerve deficit.  Skin: Skin is warm and dry. No rash noted. He is not diaphoretic. No erythema.  Psychiatric: He has a normal mood and affect.    Labs: CBC    Component Value Date/Time   WBC 3.7* 12/05/2010 0800   RBC 3.31* 12/05/2010 0800   HGB 9.3* 12/05/2010 0800   HCT 28.4* 12/05/2010 0800   PLT 236 12/05/2010 0800   MCV 85.8 12/05/2010 0800   MCH 28.1 12/05/2010 0800   MCHC 32.7 12/05/2010 0800   RDW 17.8* 12/05/2010 0800   LYMPHSABS 1.5 10/25/2010 1600   MONOABS 0.4 10/25/2010 1600   EOSABS 0.0 10/25/2010 1600   BASOSABS 0.0 10/25/2010 1600    CMP     Component Value Date/Time   NA 131* 11/28/2010 1737   K 3.9 11/28/2010 1737   CL 100 11/28/2010 1737   CO2 22 11/28/2010 1737   GLUCOSE 94 11/28/2010 1737   BUN 10  11/28/2010 1737   CREATININE 0.72 11/28/2010 1737   CALCIUM 8.0* 11/28/2010 1737   PROT 7.1 11/28/2010 1737   ALBUMIN 1.8* 11/28/2010 1737   AST 19 11/28/2010 1737   ALT 13 11/28/2010 1737   ALKPHOS 90 11/28/2010 1737   BILITOT 0.4 11/28/2010 1737   GFRNONAA >90 11/28/2010 1737   GFRAA >90 11/28/2010 1737   MICRO: 11/05: peritoneal fluid : GPC in chains and pairs, GNR, ID is pending  Assessment/Plan: 60 yo M with multiple medical problems including pancreatitis c/b pseudocyst who now presents with large new subdiaphragmatic abscess s/p drainage.  empiric regimen can consist of ciprofloxacin 500mg  PO BID, and metronidazole 500mg  PO TID. Both these antibiotics have the same bioavailability in oral as in parental formulation. If patient can take oral medications, can give these antibiotics by mouth. I have called the micro lab to identify the various bacteria isolated from pseudocyst/peritoneal fluid so that we can find a simple, directed antibiotic regimen to treat his abscess. Depending on what bacteria is found in culture, we may need to change this regimen.  Length of treatment will depend on how quickly the fluid collection resolves. Recommend to re-image in 14 days to see if the fluid collection is completely drained. If it still persists, we would recommend extending antibiotics for an addn 2 wks until abscess is resolved.   Judyann Munson 12/05/2010, 11:54 PM

## 2010-12-06 ENCOUNTER — Other Ambulatory Visit: Payer: BC Managed Care – PPO | Admitting: Gastroenterology

## 2010-12-06 DIAGNOSIS — K862 Cyst of pancreas: Secondary | ICD-10-CM

## 2010-12-06 DIAGNOSIS — Z954 Presence of other heart-valve replacement: Secondary | ICD-10-CM

## 2010-12-06 DIAGNOSIS — K863 Pseudocyst of pancreas: Secondary | ICD-10-CM

## 2010-12-06 LAB — PROTIME-INR
INR: 1.42 (ref 0.00–1.49)
Prothrombin Time: 17.6 seconds — ABNORMAL HIGH (ref 11.6–15.2)

## 2010-12-06 MED ORDER — WARFARIN SODIUM 2 MG PO TABS
2.0000 mg | ORAL_TABLET | Freq: Once | ORAL | Status: AC
Start: 2010-12-06 — End: 2010-12-06
  Administered 2010-12-06: 2 mg via ORAL
  Filled 2010-12-06: qty 1

## 2010-12-06 MED ORDER — DOCUSATE SODIUM 100 MG PO CAPS
100.0000 mg | ORAL_CAPSULE | Freq: Every day | ORAL | Status: DC
  Administered 2010-12-06 – 2010-12-07 (×2): 100 mg via ORAL
  Filled 2010-12-06 (×2): qty 1

## 2010-12-06 MED ORDER — METRONIDAZOLE 500 MG PO TABS
500.0000 mg | ORAL_TABLET | Freq: Three times a day (TID) | ORAL | Status: DC
  Administered 2010-12-06 – 2010-12-07 (×4): 500 mg via ORAL
  Filled 2010-12-06 (×6): qty 1

## 2010-12-06 MED ORDER — CIPROFLOXACIN HCL 500 MG PO TABS
500.0000 mg | ORAL_TABLET | Freq: Two times a day (BID) | ORAL | Status: DC
  Administered 2010-12-06 – 2010-12-07 (×3): 500 mg via ORAL
  Filled 2010-12-06 (×5): qty 1

## 2010-12-06 NOTE — Progress Notes (Signed)
      [facility] Daily Rounding Note 12/06/2010, 9:15 AM  SUBJECTIVE:  drain output yesterday.  On Cipro & Flagyl.  Note ID consult.  Nothing growing from abcess or from blood.  No fevers.  Twinges of pain at drain site with twisting.  Stools hard, constipation last PM.  Takes stool softener at home.     OBJECTIVE:  Vital signs in last 24 hours: Temp:  [97.5 F (36.4 C)-98.1 F (36.7 C)] 98.1 F (36.7 C) (11/08 0532) Pulse Rate:  [51-73] 69  (11/08 0532) Resp:  [16-17] 17  (11/08 0532) BP: (77-104)/(43-66) 102/66 mmHg (11/08 0532) SpO2:  [97 %-98 %] 97 % (11/08 0532) Last BM Date: 12/04/10  General:  Looks better Heart: RRR, valve click Abdomen: Soft, drain site bandage.  Slightly purulent but clear fluid in JP drain Bowel sounds: Active. Extremities: Still L )) R pedal edema but overall better and no redness. Neuro/Psych:  Mood less blunted.    Intake/Output from previous day: 11/07 0701 - 11/08 0700 In: 240 [P.O.:240] Out: 109 [Drains:108; Stool:1]  Lab Results:  Basename 12/05/10 0800  WBC 3.7*  HGB 9.3*  HCT 28.4*  PLT 236   BMET No results found for this basename: NA:3,K:3,CL:3,CO2:3,GLUCOSE:3,BUN:3,CREATININE:3,CALCIUM:3 in the last 72 hours LFT No results found for this basename: PROT,ALBUMIN,AST,ALT,ALKPHOS,BILITOT,BILIDIR,IBILI in the last 72 hours PT/INR  Basename 12/06/10 0550 12/05/10 0800  LABPROT 17.6* 17.2*  INR 1.42 1.38   Hepatitis Panel No results found for this basename: HEPBSAG,HCVAB,HEPAIGM,HEPBIGM in the last 72 hours   Studies/Results: No results found.  ASSESMENT:ASSESMENT:   1 SP drainage of large panc pseudocyst. Pt is doing much better.  Can change to oral abx according to ID. 2 Mechanical AVR Coumadin on hold. High dose Lovenox in place. I think we can restart coumadin. 3 [place] anemia. Heme Pos. EGD unrevealing as to source. Plan to complete Colonoscopy as outpt in future. 4 Pancreatic/duodenal fistula.   PLAN: 1.   Change to oral Cipro Flagyl.  Change to daily colace.  2. Restart Warfarin.  3.  Pharm consult to manage Warfarin.  LOS: 8 days   Jennye Moccasin  12/06/2010, 9:15 AM

## 2010-12-06 NOTE — Progress Notes (Signed)
  Subjective: Feeling well. States abdominal pain much improved.  Denies nausea, vomiting or abdominal pain during visit.  Objective: Vital signs in last 24 hours: Temp:  [97.5 F (36.4 C)-98.1 F (36.7 C)] 98.1 F (36.7 C) (11/08 0532) Pulse Rate:  [51-73] 69  (11/08 0532) Resp:  [16-17] 17  (11/08 0532) BP: (77-112)/(43-66) 112/62 mmHg (11/08 0900) SpO2:  [97 %-98 %] 97 % (11/08 0532) Last BM Date: 12/04/10  Intake/Output from previous day: 11/07 0701 - 11/08 0700 In: 240 [P.O.:240] Out: 109 [Drains:108; Stool:1] Intake/Output this shift: Total I/O In: 480 [P.O.:480] Out: 40 [Drains:40]  PE:  Drain intact with thin purulent foul smelling drainage in JP bulb.  Abdomen drain soft and dry, nontender.  No evidence of leakage around insertion site.     Lab Results:   Basename 12/05/10 0800  WBC 3.7*  HGB 9.3*  HCT 28.4*  PLT 236   BMET No results found for this basename: NA:2,K:2,CL:2,CO2:2,GLUCOSE:2,BUN:2,CREATININE:2,CALCIUM:2 in the last 72 hours PT/INR  Basename 12/06/10 0550 12/05/10 0800  LABPROT 17.6* 17.2*  INR 1.42 1.38   ABG No results found for this basename: PHART:2,PCO2:2,PO2:2,HCO3:2 in the last 72 hours  Studies/Results: No results found.  Anti-infectives: Anti-infectives     Start     Dose/Rate Route Frequency Ordered Stop   12/06/10 1400   metroNIDAZOLE (FLAGYL) tablet 500 mg        500 mg Oral 3 times per day 12/06/10 0943     12/06/10 1030   ciprofloxacin (CIPRO) tablet 500 mg        500 mg Oral 2 times daily 12/06/10 0943     12/05/10 1200   metroNIDAZOLE (FLAGYL) IVPB 500 mg  Status:  Discontinued        500 mg 100 mL/hr over 60 Minutes Intravenous Every 6 hours 12/05/10 1047 12/06/10 0943   12/05/10 1100   ciprofloxacin (CIPRO) IVPB 400 mg  Status:  Discontinued        400 mg 200 mL/hr over 60 Minutes Intravenous Every 12 hours 12/05/10 1047 12/06/10 0943          Assessment/Plan:  Infected pancreatic pseudocyst s/p  percutaneous drainage placement 12/03/10.   To continue drain and flushes at this time. IR to follow at least 2x week while IP. Call if need [place] sooner or questions with drain management.   CAMPBELL,PAMELA D 12/06/2010

## 2010-12-06 NOTE — Progress Notes (Addendum)
INITIAL ADULT NUTRITION ASSESSMENT Date: 12/06/2010   Time: 10:44 AM  Reason for Assessment: Nutrition Risk Report  ASSESSMENT: Male 60 y.o.  Dx: pancreatic/duodenal fistula, s/p drain of pancreatic pseudocyst  Hx:  Past Medical History  Diagnosis Date  . Aortic insufficiency     with bicuspid aortic valve (the last MRI demonstrated the aortic root to be 4.8 x 4.7 cm.) Mod regurgitation. Normal chamber size with very mild left ventricular dysfunction.)  . Dyslipidemia   . Duodenal ulcer     with GI bleeding  . Hypertension   . Pericardial effusion 07/2009    admission to [facility], s/p pericardial window  . PNA (pneumonia) 10/2009    admnission to [facility], with right pleural effusion, s/p thoracentesis  . 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 Syncope 08/2009    admission for fever and syncope, no source for fever seen on cultures. Syncope thought to be r/t orthostasis  . Pleural effusion 10/2009    s/p thoracentesis  . Cough 06/28/2009    Dr. Sherene Sires  . GERD (gastroesophageal reflux disease) 03/23/2001    egd w/ dilation...Marland KitchenMarland KitchenArlyce Dice  . Acute pancreatitis     with pancreatic necrosis  . CHF (congestive heart failure)   . Orthostasis 08/2009    Fever and syncope, no source for fever on cultures.  [name] Heart murmur   . Shortness of breath     "w/exerction"  . Pneumonia 2011    "twice"  . Anemia   . Blood transfusion   . Hemorrhoids   . GI bleed     Gastritis, ? Chem, Working for [employer]    Related Meds:     . sodium chloride   Intravenous Once  . ALPRAZolam  0.5 mg Oral Daily  . ciprofloxacin  500 mg Oral BID  . docusate sodium  100 mg Oral Daily  . enoxaparin (LOVENOX) injection  85 mg Subcutaneous Q12H  . feeding supplement  237 mL Oral BID BM  . fluticasone  2 spray Each Nare Daily  . furosemide  20 mg Oral Daily  . lipase/protease/amylase  2 capsule Oral TID WC  . metroNIDAZOLE  500 mg Oral Q8H  . nebivolol  5 mg Oral Daily   . pantoprazole  40 mg Oral Q1200  . potassium chloride  20 mEq Oral Daily  . sodium phosphate  1 enema Rectal Once  . warfarin  2 mg Oral ONCE-1800  . DISCONTD: ciprofloxacin  400 mg Intravenous Q12H  . DISCONTD: docusate sodium  100 mg Oral QODAY  . DISCONTD: metronidazole  500 mg Intravenous Q6H     Ht: 5'11 (180 cm)  Wt: 191 lb 2.2 oz (86.7 kg)  Ideal Wt: 78 kg % Ideal Wt: 111%  Usual Wt: 265 lb (120 kg) % Usual Wt: 72%  Body mass index is 26.66 kg/(m^2).  Food/Nutrition Related Hx: dysphagia & weight loss > 10 lbs in past month per nutrition screen  Labs: no BMET in past 72 hours  I/O last 3 completed shifts: In: 740 [P.O.:240; I.V.:500] Out: 774 [Drains:773; Stool:1] Total I/O In: 480 [P.O.:480] Out: -    Diet Order: Heart Healthy  Supplements/Tube Feeding: Ensure Clinical Strength BID  IVF:    dextrose 5 % and 0.45% NaCl Last Rate: 50 mL/hr at 12/06/10 0328    Estimated Nutritional Needs:   Kcal: 2,000-2,200 Protein: 100-110 gms Fluid: 2.0-2.2 L  Patient s/p pancreatic pseudocyst drainage 11/5; states appetite has improved -- noted intake  was very poor PTA due to early satiety; currently consuming 100% of meals per records; reports 28% weight loss > 1 year (since June 2011) however recently has been stable; does not have trouble swallowing at this time due to recent esophageal dilation (October 2012); drinking Ensure Clinical Strength supplements well.  NUTRITION DIAGNOSIS: -Malnutrition (NI-5.2).  Status: Ongoing  RELATED TO: hx of dysphagia, poor appetite, hx of necrotizing pancreatitis  AS EVIDENCE BY: <75 % intake of estimated energy requirement for >1 month, 28% weight loss >1 year  MONITORING/EVALUATION(Goals): Goal: pt to meet >90% of estimated nutrition needs to prevent further weight loss Monitor: PO intake, weight, labs, skin integrity  EDUCATION NEEDS: -No education needs identified at this time  INTERVENTION: 1. Continue Ensure  Clinical Strength PO BID 2. RD to follow for nutrition care plan  Dietitian [phone]  DOCUMENTATION CODES Per approved criteria  -Severe malnutrition in the context of chronic illness    Waylan Boga Federkiewicz 12/06/2010, 10:44 AM

## 2010-12-06 NOTE — Progress Notes (Signed)
Patient seen and I agree with the above documentation, including the assessment and plan. Much less outpt via JP drain over last 24 hrs. Appreciate ID input, now on cipro/flagyl, which we will change to oral. Transition back to warfarin for AVR with lovenox bridge [facility] following and they will follow as outpt.

## 2010-12-06 NOTE — Progress Notes (Signed)
ANTICOAGULATION CONSULT NOTE - Initial Consult  Pharmacy Consult for Prosthetic Valve  Indication: Warfarin  Allergies  Allergen Reactions  . Ace Inhibitors     REACTION: cough  . Primaxin (Imipenem W/Cilastatin Sodium)     rash    Patient Measurements: Height: 5\' 11"  (180.3 cm) (Entered for Cutover) Weight: 191 lb 2.2 oz (86.7 kg) IBW/kg (Calculated) : 75.3   Vital Signs: Temp: 98.1 F (36.7 C) (11/08 0532) Temp src: Oral (11/08 0532) BP: 102/66 mmHg (11/08 0532) Pulse Rate: 69  (11/08 0532)  Labs:  Basename 12/06/10 0550 12/05/10 0800 12/04/10 0615  HGB -- 9.3* --  HCT -- 28.4* --  PLT -- 236 --  APTT -- -- --  LABPROT 17.6* 17.2* 18.3*  INR 1.42 1.38 1.49  HEPARINUNFRC -- -- --  CREATININE -- -- --  CKTOTAL -- -- --  CKMB -- -- --  TROPONINI -- -- --   Estimated Creatinine Clearance: 107.2 ml/min (by C-G formula based on Cr of 0.72).  Medical History: Past Medical History  Diagnosis Date  . Aortic insufficiency     with bicuspid aortic valve (the last MRI demonstrated the aortic root to be 4.8 x 4.7 cm.) Mod regurgitation. Normal chamber size with very mild left ventricular dysfunction.)  . Dyslipidemia   . Duodenal ulcer     with GI bleeding  . Hypertension   . Pericardial effusion 07/2009    admission to [facility], s/p pericardial window  . PNA (pneumonia) 10/2009    admnission to [facility], with right pleural effusion, s/p thoracentesis  . 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 Syncope 08/2009    admission for fever and syncope, no source for fever seen on cultures. Syncope thought to be r/t orthostasis  . Pleural effusion 10/2009    s/p thoracentesis  . Cough 06/28/2009    Dr. Sherene Sires  . GERD (gastroesophageal reflux disease) 03/23/2001    egd w/ dilation...Marland KitchenMarland KitchenArlyce Dice  . Acute pancreatitis     with pancreatic necrosis  . CHF (congestive heart failure)   . Orthostasis 08/2009    Fever and syncope, no source for  fever on cultures.  [name] Heart murmur   . Shortness of breath     "w/exerction"  . Pneumonia 2011    "twice"  . Anemia   . Blood transfusion   . Hemorrhoids   . GI bleed     Gastritis, ? Chem, Working for [employer]    Medications:  Prescriptions prior to admission  Medication Sig Dispense Refill  . ALPRAZolam (XANAX) 0.5 MG tablet Take 0.5 mg by mouth at bedtime as needed. For sleep/anxiety      . chlorpheniramine (CHLOR-TRIMETON) 4 MG tablet Take 4 mg by mouth at bedtime as needed. For allergy symptoms      . dextromethorphan (DELSYM) 30 MG/5ML liquid Take 60 mg by mouth as needed. For cough      . docusate sodium (COLACE) 100 MG capsule Take 100 mg by mouth every other day.       . enoxaparin (LOVENOX) 80 MG/0.8ML SOLN Inject 0.8 mLs (80 mg total) into the skin every 12 (twelve) hours.  10 Syringe  1  . famotidine (PEPCID) 20 MG tablet Take 20 mg by mouth at bedtime. One at bedtime       . ferrous sulfate 324 (65 FE) MG TBEC Take 1 tablet by mouth 2 (two) times daily.       . fluticasone (FLONASE) 50 MCG/ACT  nasal spray Place 2 sprays into the nose daily as needed. For congestion       . furosemide (LASIX) 20 MG tablet Take 20 mg by mouth daily as needed. For feet swelling      . nebivolol (BYSTOLIC) 5 MG tablet Take 1 tablet (5 mg total) by mouth daily.  30 tablet  11  . Pancrelipase, Lip-Prot-Amyl, 6000 UNITS CPEP Take 1-2 capsules by mouth 3 (three) times daily before meals. 2 capsules in the morning; 1 capsule with lunch and dinner       . pantoprazole (PROTONIX) 40 MG tablet Take 40 mg by mouth 2 (two) times daily. Take 30- 60 min before your first and last meals of the day       . potassium chloride SA (K-DUR,KLOR-CON) 20 MEQ tablet Take 1 tablet (20 mEq total) by mouth daily.  30 tablet  6  . traMADol (ULTRAM) 50 MG tablet Take 1-2 tablets every 4-6 hours as needed for cough  120 tablet  1  . warfarin (COUMADIN) 1 MG tablet Take 1-2 mg by mouth daily. Take as directed by  Anticoagulation clinic; 1 tablet every day of the week except Tuesday take 2 tablets       . zolpidem (AMBIEN) 5 MG tablet Take 1 tablet (5 mg total) by mouth at bedtime as needed for sleep.  30 tablet  3  . DISCONTD: famotidine (PEPCID) 20 MG tablet One at bedtime  30 tablet  11  . DISCONTD: fluticasone (FLONASE) 50 MCG/ACT nasal spray 2 sprays by Nasal route daily.  16 g  11  . DISCONTD: Pancrelipase, Lip-Prot-Amyl, (CREON) 6000 UNITS CPEP 1-2 tabs tid with meals  540 capsule  3  . DISCONTD: pantoprazole (PROTONIX) 40 MG tablet Take 30- 60 min before your first and last meals of the day      . DISCONTD: warfarin (COUMADIN) 1 MG tablet Take as directed by Anticoagulation clinic  60 tablet  3    Assessment: 60 yo M admitted and now s/p drainage of pancreatic cyst.  Now transitioning to PO meds and restarting warfarin   Goal of Therapy:  INR 2-3   Plan:  1) Warfarin 2 mg PO x 1 today 2) Continue warfarin 85 mg SQ q12h 3) Daily INR, CBC q72h, follow up warfarin education  Lovenia Kim Pharm.D., BCPS 12/06/2010 9:57 AM [phone]

## 2010-12-06 NOTE — Progress Notes (Signed)
Subjective: Feeling better, no fevers, improved appetite, less drainage noted in his bulb from JP drain.  Abtx: cipro 500mg  BID D#2 Metronidazole 500mg  TID D#2  Medications: i have reviewed his current medications Objective: Vital signs in last 24 hours: Temp:  [98.1 F (36.7 C)] 98.1 F (36.7 C) (11/08 1422) Pulse Rate:  [51-72] 72  (11/08 1422) Resp:  [16-18] 18  (11/08 1422) BP: (77-112)/(43-66) 99/63 mmHg (11/08 1422) SpO2:  [95 %-97 %] 95 % (11/08 1422) Weight change:  Last BM Date: 12/04/10  Constitutional: He is oriented to person, place, and time. He appears well-developed. No distress.  HENT:  Head: Normocephalic and atraumatic.  Nose: Nose normal.  Mouth/Throat: Oropharynx is clear and moist. No oropharyngeal exudate.  Eyes: Conjunctivae and EOM are normal. Pupils are equal, round, and reactive to light. Right eye exhibits no discharge. Left eye exhibits no discharge. No scleral icterus.  Neck: No JVD present. No tracheal deviation present.  Cardiovascular: Normal rate and regular rhythm.  Murmur heard. 2/6 murmur with click c/w AVR  Respiratory: Breath sounds normal. No respiratory distress. He has no wheezes. He has no rales. He exhibits no tenderness.  GI: He exhibits no distension. There is no tenderness. There is no rebound and no guarding.  Drain in RUQ  Musculoskeletal: Normal range of motion. He exhibits no edema and no tenderness.  Lymphadenopathy:  He has no cervical adenopathy.  Neurological: He is alert and oriented to person, place, and time. No cranial nerve deficit.  Skin: Skin is warm and dry. No rash noted. He is not diaphoretic. No erythema.  Psychiatric: He has a normal mood and affect.   Lab Results:  Basename 12/05/10 0800  WBC 3.7*  HGB 9.3*  HCT 28.4*  PLT 236   MICRO: ID from peritoneal fluid still pending  Assessment/Plan:  pancreatic pseudocyst abscess = polymicrobial organisms still pending. Continue on ciprofloxacin and  metronidazole, would at least treat for 14 days. Then have repeat abd CT imaging to see if abscess has resolved, or if still present, would need another 2 wk course of antibiotics   LOS: 8 days   Jandy Brackens 12/06/2010, 4:00 PM

## 2010-12-06 NOTE — Progress Notes (Signed)
MD notified of pt request for an enema to help him use the bathroom. His last bowel movement was on 12/04/2010. He stated that he could feel something hard in his rectum ready to come out and he is unable to strain due to abdominal pain and drain. Orders received from MD. Cassell Smiles Diandra    11/072012   2145  Pt BP was 82/52 manually and a pulse of 72. Oncall MD notified of pt current condition and previous hx of CHF. MD stated that it would be okay to still give the pt 50mg  of Tramadol and to continue to monitor the pt. Orders were received. Mechele Collin

## 2010-12-06 NOTE — Progress Notes (Signed)
Subjective: Feels better. Notes less coming thru drain.    Objective: Vital signs in last 24 hours: Temp:  [97.5 F (36.4 C)-98.1 F (36.7 C)] 98.1 F (36.7 C) (11/08 0532) Pulse Rate:  [51-73] 69  (11/08 0532) Resp:  [16-17] 17  (11/08 0532) BP: (77-104)/(43-66) 102/66 mmHg (11/08 0532) SpO2:  [97 %-98 %] 97 % (11/08 0532) Last BM Date: 12/04/10  Intake/Output from previous day: 11/07 0701 - 11/08 0700 In: 240 [P.O.:240] Out: 109 [Drains:108; Stool:1] Intake/Output this shift:    PE:  Alert cooperative, no distress Abd: soft, non tender,+bowel sounds, no distension, +BM, Drain Yellow colored, cloudy, Malodorus. Drainage.  Lab Results:   Basename 12/05/10 0800  WBC 3.7*  HGB 9.3*  HCT 28.4*  PLT 236    BMET No results found for this basename: NA:2,K:2,CL:2,CO2:2,GLUCOSE:2,BUN:2,CREATININE:2,CALCIUM:2 in the last 72 hours PT/INR  Basename 12/06/10 0550 12/05/10 0800  LABPROT 17.6* 17.2*  INR 1.42 1.38     Studies/Results: No results found.  Anti-infectives: Anti-infectives    None     Current Facility-Administered Medications  Medication Dose Route Frequency Provider Last Rate Last Dose  . 0.9 %  sodium chloride infusion   Intravenous Once Caleen Essex III, MD 250 mL/hr at 12/05/10 2211    . ALPRAZolam Prudy Feeler) tablet 0.5 mg  0.5 mg Oral Daily Louis Meckel, MD   0.5 mg at 12/05/10 1124  . ALPRAZolam Prudy Feeler) tablet 0.5 mg  0.5 mg Oral QHS PRN Louis Meckel, MD      . ciprofloxacin (CIPRO) IVPB 400 mg  400 mg Intravenous Q12H Ardeth Sportsman, MD   400 mg at 12/05/10 2252  . dextromethorphan (DELSYM) 30 MG/5ML liquid 30 mg  30 mg Oral Q6H PRN Erick Blinks, MD      . dextrose 5 %-0.45 % sodium chloride infusion   Intravenous Continuous Louis Meckel, MD 50 mL/hr at 12/06/10 0328    . diphenhydrAMINE (BENADRYL) capsule 25 mg  25 mg Oral Q6H PRN Louis Meckel, MD   25 mg at 12/02/10 2118  . docusate sodium (COLACE) capsule 100 mg  100 mg Oral QODAY  Louis Meckel, MD   100 mg at 12/05/10 1105  . enoxaparin (LOVENOX) injection 85 mg  85 mg Subcutaneous Q12H Elwin Sleight, PHARMD   85 mg at 12/05/10 2339  . feeding supplement (ENSURE CLINICAL STRENGTH) liquid 237 mL  237 mL Oral BID BM Louis Meckel, MD   237 mL at 12/05/10 1500  . fluticasone (FLONASE) 50 MCG/ACT nasal spray 2 spray  2 spray Each Nare Daily Louis Meckel, MD   2 spray at 12/05/10 1106  . furosemide (LASIX) tablet 20 mg  20 mg Oral Daily Louis Meckel, MD   20 mg at 12/05/10 1000  . hydrocortisone 1 % cream   Topical Q3H PRN Louis Meckel, MD      . lipase/protease/amylase (CREON-10/PANCREASE) capsule 2 capsule  2 capsule Oral TID WC Louis Meckel, MD   2 capsule at 12/05/10 1820  . metroNIDAZOLE (FLAGYL) IVPB 500 mg  500 mg Intravenous Q6H Ardeth Sportsman, MD   500 mg at 12/06/10 0649  . morphine 4 MG/ML injection 2 mg  2 mg Intravenous Q4H PRN Louis Meckel, MD   2 mg at 12/05/10 0000  . nebivolol (BYSTOLIC) tablet 5 mg  5 mg Oral Daily Louis Meckel, MD   5 mg at 12/05/10 1000  . ondansetron (ZOFRAN)  injection 4 mg  4 mg Intravenous Q6H PRN Louis Meckel, MD      . pantoprazole (PROTONIX) EC tablet 40 mg  40 mg Oral Q1200 Dianah Field, PA   40 mg at 12/05/10 1200  . potassium chloride SA (K-DUR,KLOR-CON) CR tablet 20 mEq  20 mEq Oral Daily Louis Meckel, MD   20 mEq at 12/05/10 1000  . sodium phosphate (FLEET) 7-19 GM/118ML enema 1 enema  1 enema Rectal Once Caleen Essex III, MD   1 enema at 12/05/10 2252  . traMADol (ULTRAM) tablet 50-100 mg  50-100 mg Oral Q6H PRN [place] R Smith, PHARMD   50 mg at 12/05/10 2338  . zolpidem (AMBIEN) tablet 5 mg  5 mg Oral QHS PRN Louis Meckel, MD      . DISCONTD: pantoprazole (PROTONIX) EC tablet 40 mg  40 mg Oral BID AC Louis Meckel, MD   40 mg at 12/05/10 0630    Assessment/Plan  1. Subdiaphragmatic abscess. Cultures pending.  ID following. On Cipro & Flagyl. 2. Hx of Pancreatic Pseudocyst,  pancreatitis. PLAN:  Continue to follow. Agree with current medical management, percutaneous drainage.  LOS: 8 days    [name] [name] 12/06/2010

## 2010-12-07 ENCOUNTER — Other Ambulatory Visit (INDEPENDENT_AMBULATORY_CARE_PROVIDER_SITE_OTHER): Payer: Self-pay | Admitting: General Surgery

## 2010-12-07 DIAGNOSIS — K316 Fistula of stomach and duodenum: Secondary | ICD-10-CM

## 2010-12-07 DIAGNOSIS — D62 Acute posthemorrhagic anemia: Secondary | ICD-10-CM

## 2010-12-07 LAB — CULTURE, ROUTINE-ABSCESS

## 2010-12-07 LAB — PROTIME-INR: INR: 1.43 (ref 0.00–1.49)

## 2010-12-07 MED ORDER — AMOXICILLIN-POT CLAVULANATE 875-125 MG PO TABS: 1.0000 | ORAL_TABLET | Freq: Two times a day (BID) | ORAL | Status: AC

## 2010-12-07 MED ORDER — FAMOTIDINE 20 MG PO TABS: ORAL_TABLET | ORAL | Status: DC

## 2010-12-07 MED ORDER — WARFARIN SODIUM 2 MG PO TABS
2.0000 mg | ORAL_TABLET | Freq: Once | ORAL | Status: AC
  Administered 2010-12-07: 2 mg via ORAL
  Filled 2010-12-07: qty 1

## 2010-12-07 MED ORDER — HYDROCORTISONE 1 % EX CREA
TOPICAL_CREAM | CUTANEOUS | Status: DC | PRN
  Administered 2010-12-07: 10:00:00 via TOPICAL
  Filled 2010-12-07: qty 28

## 2010-12-07 MED ORDER — PANCRELIPASE (LIP-PROT-AMYL) 12000-38000 UNITS PO CPEP: 2.0000 | ORAL_CAPSULE | Freq: Three times a day (TID) | ORAL | Status: DC

## 2010-12-07 MED ORDER — CIPROFLOXACIN HCL 500 MG PO TABS: 500.0000 mg | ORAL_TABLET | Freq: Two times a day (BID) | ORAL | Status: DC

## 2010-12-07 MED ORDER — PANCRELIPASE (LIP-PROT-AMYL) 6000-19000 UNITS PO CPEP: ORAL_CAPSULE | ORAL | Status: DC

## 2010-12-07 MED ORDER — AMOXICILLIN-POT CLAVULANATE 875-125 MG PO TABS: 1.0000 | ORAL_TABLET | Freq: Two times a day (BID) | ORAL | Status: DC

## 2010-12-07 MED ORDER — METRONIDAZOLE 500 MG PO TABS: 500.0000 mg | ORAL_TABLET | Freq: Three times a day (TID) | ORAL | Status: DC

## 2010-12-07 MED ORDER — WARFARIN SODIUM 1 MG PO TABS: 1.0000 mg | ORAL_TABLET | Freq: Every day | ORAL | Status: DC

## 2010-12-07 NOTE — Progress Notes (Signed)
Patient seen and I agree with the above documentation, including the assessment and plan. Need to work on home health for drain care. Will follow-up with Dr. Luisa Hart who will follow the repeat imaging and drain study to assess for fistulous tract. Lovenox bridge to warfarin.  Needs INR check next week after d/c

## 2010-12-07 NOTE — Discharge Planning (Signed)
Dc dictated see # [id number]   Jennye Moccasin PA-C

## 2010-12-07 NOTE — Progress Notes (Signed)
CX c/w anaerobic strep. -prob would continue Cipro/Flagyl but defer to ID -drain study +/- CT followup -outpt f/u Dr. Luisa Hart  The patient is stable.  There is no evidence of peritonitis, acute abdomen, nor shock.  There is no strong evidence of failure of improvement nor decline with current non-operative management.  There is no need for surgery at the present moment.  We will follow up on Sunday after CT done

## 2010-12-07 NOTE — Progress Notes (Signed)
Oda Lansdowne C. Mearle Drew, M.D., F.A.C.S. Gastrointestinal and Minimally Invasive Surgery [facility] [address], [address] [place], [place] [address] [phone] Main / Paging [phone] Voice Mail  

## 2010-12-07 NOTE — Progress Notes (Signed)
[name] [name] Daily Rounding Note 12/07/2010, 9:02 AM  SUBJECTIVE:  Feels well, better every day.  Occasional pain at drain site.   OBJECTIVE:  Vital signs in last 24 hours: Temp:  [97.7 F (36.5 C)-98.1 F (36.7 C)] 97.7 F (36.5 C) (11/09 0521) Pulse Rate:  [61-72] 61  (11/09 0521) Resp:  [16-18] 16  (11/09 0521) BP: (99-122)/(63-73) 122/73 mmHg (11/09 0521) SpO2:  [95 %-97 %] 97 % (11/09 0521) Last BM Date: 12/06/10 General: Looks better not toxic  Heart: RRR   Valve click  Abdomen:  Soft NT, active sounds Extremities: Trace pedal edema Neuro/Psych: Not depressed  Intake/Output from previous day: 11/08 0701 - 11/09 0700 In: 1990 [P.O.:1440; I.V.:550] Out: 475 [Urine:400; Drains:75] Intake/Output this shift:    Lab Results:  Basename 12/05/10 0800  WBC 3.7*  HGB 9.3*  HCT 28.4*  PLT 236   BMET No results found for this basename: NA:3,K:3,CL:3,CO2:3,GLUCOSE:3,BUN:3,CREATININE:3,CALCIUM:3 in the last 72 hours LFT No results found for this basename: PROT,ALBUMIN,AST,ALT,ALKPHOS,BILITOT,BILIDIR,IBILI in the last 72 hours PT/INR  Basename 12/07/10 0610 12/06/10 0550  LABPROT 17.7* 17.6*  INR 1.43 1.42   Hepatitis Panel No results found for this basename: HEPBSAG,HCVAB,HEPAIGM,HEPBIGM in the last 72 hours  Results for orders placed during the hospital encounter of 11/28/10  MRSA PCR SCREENING     Status: Normal   Collection Time   11/28/10  5:28 PM      Component Value Range Status Comment   MRSA by PCR NEGATIVE  NEGATIVE  Final   URINE CULTURE     Status: Normal   Collection Time   12/01/10  5:50 PM      Component Value Range Status Comment   Specimen Description URINE, CLEAN CATCH   Final    Special Requests NONE   Final    Setup Time [id number]   Final    Colony Count 25,000 COLONIES/ML   Final    Culture KLEBSIELLA OXYTOCA   Final    Report Status 12/04/2010 FINAL   Final    Organism ID, Bacteria KLEBSIELLA OXYTOCA   Final    Recent Results  (from the past 240 hour(s))  MRSA PCR SCREENING     Status: Normal   Collection Time   11/28/10  5:28 PM      Component Value Range Status Comment   MRSA by PCR NEGATIVE  NEGATIVE  Final   URINE CULTURE     Status: Normal   Collection Time   12/01/10  5:50 PM      Component Value Range Status Comment   Specimen Description URINE, CLEAN CATCH   Final    Special Requests NONE   Final    Setup Time [id number]   Final    Colony Count 25,000 COLONIES/ML   Final    Culture KLEBSIELLA OXYTOCA   Final    Report Status 12/04/2010 FINAL   Final    Organism ID, Bacteria KLEBSIELLA OXYTOCA   Final   ANAEROBIC CULTURE     Status: Normal (Preliminary result)   Collection Time   12/03/10  1:37 PM      Component Value Range Status Comment   Specimen Description PERITONEAL CAVITY   Final    Special Requests NONE   Final    Gram Stain     Final    Value: ABUNDANT WBC PRESENT, PREDOMINANTLY PMN     NO SQUAMOUS EPITHELIAL CELLS SEEN     ABUNDANT GRAM POSITIVE COCCI IN PAIRS AND CHAINS  ABUNDANT GRAM NEGATIVE RODS   Culture     Final    Value: NO ANAEROBES ISOLATED; CULTURE IN PROGRESS FOR 5 DAYS   Report Status PENDING   Incomplete   CULTURE, ROUTINE-ABSCESS     Status: Normal (Preliminary result)   Collection Time   12/03/10  1:37 PM      Component Value Range Status Comment   Specimen Description ABSCESS PERITONEAL CAVITY   Final    Special Requests NONE   Final    Gram Stain PENDING   Incomplete    Culture Culture reincubated for better growth   Final    Report Status PENDING   Incomplete     Studies/Results: No results found.  ASSESMENT: 1. Sp drainage lg abcess below diaphragm, question if this communicates with the pancreas. Marked clinical improvement since this was drained.  Day 3 cipr/flagyl. Note prelim culture data. 2.  Mech AVR.  Recoumadinizing the pt. Not yet therapeutic.  On high dose Lovenox for interim.  3. [place] anemia.  Complete outpt colonoscopy in future to finish  work up.  EGD unrevealing   PLAN: 1.  DC home soon, or wait until after CT on Monday? 2. DC IVF. 3.   CBC in AM     LOS: 9 days   Jennye Moccasin  12/07/2010, 9:02 AM

## 2010-12-07 NOTE — Progress Notes (Signed)
  Subjective: Pt without complaints.  Tolerating a regular diet.  Objective: Vital signs in last 24 hours: Temp:  [97.7 F (36.5 C)-98.1 F (36.7 C)] 97.7 F (36.5 C) (11/09 0521) Pulse Rate:  [61-72] 61  (11/09 0521) Resp:  [16-18] 16  (11/09 0521) BP: (99-122)/(62-73) 122/73 mmHg (11/09 0521) SpO2:  [95 %-97 %] 97 % (11/09 0521) Last BM Date: 12/06/10  Intake/Output from previous day: 11/08 0701 - 11/09 0700 In: 1990 [P.O.:1440; I.V.:550] Out: 475 [Urine:400; Drains:75] Intake/Output this shift:    PE: Abd: soft, NT, ND, +BS, JP with more serous type output.  Lab Results:   Basename 12/05/10 0800  WBC 3.7*  HGB 9.3*  HCT 28.4*  PLT 236   BMET No results found for this basename: NA:2,K:2,CL:2,CO2:2,GLUCOSE:2,BUN:2,CREATININE:2,CALCIUM:2 in the last 72 hours PT/INR  Basename 12/07/10 0610 12/06/10 0550  LABPROT 17.7* 17.6*  INR 1.43 1.42     Studies/Results: @RISRSLT2 @  Anti-infectives: Anti-infectives     Start     Dose/Rate Route Frequency Ordered Stop   12/06/10 1400   metroNIDAZOLE (FLAGYL) tablet 500 mg        500 mg Oral 3 times per day 12/06/10 0943     12/06/10 1030   ciprofloxacin (CIPRO) tablet 500 mg        500 mg Oral 2 times daily 12/06/10 0943     12/05/10 1200   metroNIDAZOLE (FLAGYL) IVPB 500 mg  Status:  Discontinued        500 mg 100 mL/hr over 60 Minutes Intravenous Every 6 hours 12/05/10 1047 12/06/10 0943   12/05/10 1100   ciprofloxacin (CIPRO) IVPB 400 mg  Status:  Discontinued        400 mg 200 mL/hr over 60 Minutes Intravenous Every 12 hours 12/05/10 1047 12/06/10 0943           Assessment/Plan  1. Sub-diaphragmatic abscess, s/p perc drain 2. H/o pancreatic pseudocyst, almost completely resolved  Plan: 1. Will need to repeat CT scan on Sunday if pt still here, with a drain injection to define the communication between the abscess cavity and the duodenum.  If pt has been d/c home before then will need to set that up as  an outpt and have him follow-up with Dr. Luisa Hart. 2. Cultures still pending. 3. Will follow.   LOS: 9 days    Amiliah Campisi E 12/07/2010

## 2010-12-07 NOTE — Progress Notes (Signed)
ANTICOAGULATION CONSULT NOTE - Follow Up Consult  Pharmacy Consult for Lovenox  Indication: Mechanical AVR  Allergies  Allergen Reactions  . Ace Inhibitors     REACTION: cough  . Primaxin (Imipenem W/Cilastatin Sodium)     rash    Patient Measurements: Height: 5\' 11"  (180.3 cm) (Entered for Cutover) Weight: 191 lb 2.2 oz (86.7 kg) IBW/kg (Calculated) : 75.3    Vital Signs: Temp: 97.7 F (36.5 C) (11/09 0521) Temp src: Oral (11/09 0521) BP: 122/73 mmHg (11/09 0521) Pulse Rate: 61  (11/09 0521)  Labs:  Basename 12/07/10 0610 12/06/10 0550 12/05/10 0800  HGB -- -- 9.3*  HCT -- -- 28.4*  PLT -- -- 236  APTT -- -- --  LABPROT 17.7* 17.6* 17.2*  INR 1.43 1.42 1.38  HEPARINUNFRC -- -- --  CREATININE -- -- --  CKTOTAL -- -- --  CKMB -- -- --  TROPONINI -- -- --   Estimated Creatinine Clearance: 107.2 ml/min (by C-G formula based on Cr of 0.72).   Medications:  Prescriptions prior to admission  Medication Sig Dispense Refill  . ALPRAZolam (XANAX) 0.5 MG tablet Take 0.5 mg by mouth at bedtime as needed. For sleep/anxiety      . chlorpheniramine (CHLOR-TRIMETON) 4 MG tablet Take 4 mg by mouth at bedtime as needed. For allergy symptoms      . dextromethorphan (DELSYM) 30 MG/5ML liquid Take 60 mg by mouth as needed. For cough      . docusate sodium (COLACE) 100 MG capsule Take 100 mg by mouth every other day.       . enoxaparin (LOVENOX) 80 MG/0.8ML SOLN Inject 0.8 mLs (80 mg total) into the skin every 12 (twelve) hours.  10 Syringe  1  . famotidine (PEPCID) 20 MG tablet Take 20 mg by mouth at bedtime. One at bedtime       . ferrous sulfate 324 (65 FE) MG TBEC Take 1 tablet by mouth 2 (two) times daily.       . fluticasone (FLONASE) 50 MCG/ACT nasal spray Place 2 sprays into the nose daily as needed. For congestion       . furosemide (LASIX) 20 MG tablet Take 20 mg by mouth daily as needed. For feet swelling      . nebivolol (BYSTOLIC) 5 MG tablet Take 1 tablet (5 mg total)  by mouth daily.  30 tablet  11  . Pancrelipase, Lip-Prot-Amyl, 6000 UNITS CPEP Take 1-2 capsules by mouth 3 (three) times daily before meals. 2 capsules in the morning; 1 capsule with lunch and dinner       . pantoprazole (PROTONIX) 40 MG tablet Take 40 mg by mouth 2 (two) times daily. Take 30- 60 min before your first and last meals of the day       . potassium chloride SA (K-DUR,KLOR-CON) 20 MEQ tablet Take 1 tablet (20 mEq total) by mouth daily.  30 tablet  6  . traMADol (ULTRAM) 50 MG tablet Take 1-2 tablets every 4-6 hours as needed for cough  120 tablet  1  . warfarin (COUMADIN) 1 MG tablet Take 1-2 mg by mouth daily. Take as directed by Anticoagulation clinic; 1 tablet every day of the week except Tuesday take 2 tablets       . zolpidem (AMBIEN) 5 MG tablet Take 1 tablet (5 mg total) by mouth at bedtime as needed for sleep.  30 tablet  3  . DISCONTD: famotidine (PEPCID) 20 MG tablet One at bedtime  30 tablet  11  . DISCONTD: fluticasone (FLONASE) 50 MCG/ACT nasal spray 2 sprays by Nasal route daily.  16 g  11  . DISCONTD: Pancrelipase, Lip-Prot-Amyl, (CREON) 6000 UNITS CPEP 1-2 tabs tid with meals  540 capsule  3  . DISCONTD: pantoprazole (PROTONIX) 40 MG tablet Take 30- 60 min before your first and last meals of the day      . DISCONTD: warfarin (COUMADIN) 1 MG tablet Take as directed by Anticoagulation clinic  60 tablet  3    Assessment: Patient on Coumadin PTA for mechanical AVR. Coumadin resumed last PM, INR below goal, as expected, Lovenox bridge continues. Noted patient on Cipro and Flagyl (day 2)  , both of which can abruptly increase INR d/t decreased Coumadin metabolism. Will proceed with Coumadin dosing cautiously.  Goal of Therapy:  INR 2-3, Anti Xa level 0.6-1.2 iu/ml    Plan:  Lovenox 85mg  q12h, no changes for now. Continue Lovenox until INR therapeutic. Coumadin 2mg  PO again today.  [name] 12/07/2010,7:52 AM

## 2010-12-07 NOTE — Progress Notes (Signed)
Pt selected [facility] for [facility] to assist with wound/drain care and for PT/INR blood draws. Will continue to follow for any other d/c need.  CRoyal RN MPH

## 2010-12-08 DIAGNOSIS — K862 Cyst of pancreas: Secondary | ICD-10-CM

## 2010-12-08 DIAGNOSIS — K863 Pseudocyst of pancreas: Secondary | ICD-10-CM

## 2010-12-08 LAB — ANAEROBIC CULTURE

## 2010-12-10 ENCOUNTER — Ambulatory Visit (INDEPENDENT_AMBULATORY_CARE_PROVIDER_SITE_OTHER): Payer: BC Managed Care – PPO | Admitting: *Deleted

## 2010-12-10 DIAGNOSIS — I4891 Unspecified atrial fibrillation: Secondary | ICD-10-CM

## 2010-12-10 DIAGNOSIS — I359 Nonrheumatic aortic valve disorder, unspecified: Secondary | ICD-10-CM

## 2010-12-10 DIAGNOSIS — Z7901 Long term (current) use of anticoagulants: Secondary | ICD-10-CM

## 2010-12-10 DIAGNOSIS — Z954 Presence of other heart-valve replacement: Secondary | ICD-10-CM

## 2010-12-10 LAB — POCT INR: INR: 1.4

## 2010-12-10 MED ORDER — ENOXAPARIN SODIUM 80 MG/0.8ML ~~LOC~~ SOLN: 80.0000 mg | Freq: Two times a day (BID) | SUBCUTANEOUS | Status: DC

## 2010-12-12 ENCOUNTER — Other Ambulatory Visit (INDEPENDENT_AMBULATORY_CARE_PROVIDER_SITE_OTHER): Payer: Self-pay | Admitting: General Surgery

## 2010-12-12 ENCOUNTER — Ambulatory Visit
Admission: RE | Admit: 2010-12-12 | Discharge: 2010-12-12 | Disposition: A | Discharge status: Home / Self Care | Payer: BC Managed Care – PPO | Source: Ambulatory Visit | Attending: Surgery | Admitting: Surgery

## 2010-12-12 DIAGNOSIS — K316 Fistula of stomach and duodenum: Secondary | ICD-10-CM

## 2010-12-12 MED ORDER — IOHEXOL 300 MG/ML  SOLN: 100.0000 mL | Freq: Once | INTRAMUSCULAR | Status: AC | PRN

## 2010-12-13 ENCOUNTER — Ambulatory Visit (INDEPENDENT_AMBULATORY_CARE_PROVIDER_SITE_OTHER): Payer: BC Managed Care – PPO | Admitting: *Deleted

## 2010-12-13 DIAGNOSIS — I4891 Unspecified atrial fibrillation: Secondary | ICD-10-CM

## 2010-12-13 DIAGNOSIS — I359 Nonrheumatic aortic valve disorder, unspecified: Secondary | ICD-10-CM

## 2010-12-13 DIAGNOSIS — Z954 Presence of other heart-valve replacement: Secondary | ICD-10-CM

## 2010-12-13 DIAGNOSIS — Z7901 Long term (current) use of anticoagulants: Secondary | ICD-10-CM

## 2010-12-13 LAB — POCT INR: INR: 1.3

## 2010-12-17 ENCOUNTER — Telehealth: Payer: Self-pay | Admitting: Cardiology

## 2010-12-17 ENCOUNTER — Ambulatory Visit (INDEPENDENT_AMBULATORY_CARE_PROVIDER_SITE_OTHER): Payer: BC Managed Care – PPO | Admitting: *Deleted

## 2010-12-17 DIAGNOSIS — Z954 Presence of other heart-valve replacement: Secondary | ICD-10-CM

## 2010-12-17 DIAGNOSIS — I4891 Unspecified atrial fibrillation: Secondary | ICD-10-CM

## 2010-12-17 DIAGNOSIS — I359 Nonrheumatic aortic valve disorder, unspecified: Secondary | ICD-10-CM

## 2010-12-17 DIAGNOSIS — Z7901 Long term (current) use of anticoagulants: Secondary | ICD-10-CM

## 2010-12-17 MED ORDER — WARFARIN SODIUM 1 MG PO TABS: 1.0000 mg | ORAL_TABLET | Freq: Every day | ORAL | Status: DC

## 2010-12-17 MED ORDER — WARFARIN SODIUM 1 MG PO TABS: 1.0000 mg | ORAL_TABLET | ORAL | Status: DC

## 2010-12-17 MED ORDER — WARFARIN SODIUM 2 MG PO TABS: 2.0000 mg | ORAL_TABLET | ORAL | Status: DC

## 2010-12-17 NOTE — Telephone Encounter (Signed)
Pharmacy calling regarding pt coumadin rx. Wanting to clarify dosage. Please return call to discuss further.

## 2010-12-17 NOTE — Telephone Encounter (Signed)
Called spoke with pharmacist at [facility] they received two rx for Warfarin 1mg  and 2mg  tablets.  Want to know if pt needs both and if he is taking 3mg  daily.  Called spoke with pt's wife explained 3mg  is a 1 x only dosage then resume 2mg  on Monday/1mg  all other days, wife states they will continue on the 1mg  tablets.  Advised pharmacy to cancel rx for Warfarin 2mg  tablet.

## 2010-12-17 NOTE — Progress Notes (Signed)
Addended by: Carmela Hurt on: 12/17/2010 10:45 AM   Modules accepted: Orders

## 2010-12-25 ENCOUNTER — Encounter: Payer: Self-pay | Admitting: Cardiology

## 2010-12-25 ENCOUNTER — Ambulatory Visit: Payer: BC Managed Care – PPO | Admitting: Cardiology

## 2010-12-25 ENCOUNTER — Ambulatory Visit (INDEPENDENT_AMBULATORY_CARE_PROVIDER_SITE_OTHER): Payer: BC Managed Care – PPO | Admitting: Cardiology

## 2010-12-25 DIAGNOSIS — I10 Essential (primary) hypertension: Secondary | ICD-10-CM

## 2010-12-25 DIAGNOSIS — I359 Nonrheumatic aortic valve disorder, unspecified: Secondary | ICD-10-CM

## 2010-12-25 DIAGNOSIS — I4891 Unspecified atrial fibrillation: Secondary | ICD-10-CM

## 2010-12-25 DIAGNOSIS — Z954 Presence of other heart-valve replacement: Secondary | ICD-10-CM

## 2010-12-25 MED ORDER — CARVEDILOL 6.25 MG PO TABS: 6.2500 mg | ORAL_TABLET | Freq: Two times a day (BID) | ORAL | Status: DC

## 2010-12-25 NOTE — Assessment & Plan Note (Signed)
He will continue with meds as listed.  He is having no symptomatic paroxysms of this.

## 2010-12-25 NOTE — Progress Notes (Signed)
HPI The patient presents for followup after aortic valve replacement. Since I last saw him he has had drainage of a pancreatic pseudocyst.  He feels much better with this. His appetite is better.  His cough is resolved.  He has no chest pain.  He denies SOB, PND or orthopnea.  Most importantly his fatigue is much improved.    Allergies  Allergen Reactions  . Ace Inhibitors     REACTION: cough  . Primaxin (Imipenem W/Cilastatin Sodium)     rash    Current Outpatient Prescriptions  Medication Sig Dispense Refill  . chlorpheniramine (CHLOR-TRIMETON) 4 MG tablet Take 4 mg by mouth at bedtime as needed. For allergy symptoms      . CREON 6000 UNITS CPEP Take 1 capsule by mouth 3 (three) times daily with meals.       . docusate sodium (COLACE) 100 MG capsule Take 100 mg by mouth every other day.       . famotidine (PEPCID) 20 MG tablet One at bedtime  30 tablet  3  . ferrous sulfate 324 (65 FE) MG TBEC Take 1 tablet by mouth 2 (two) times daily.       . fluticasone (FLONASE) 50 MCG/ACT nasal spray Place 2 sprays into the nose daily as needed. For congestion       . furosemide (LASIX) 20 MG tablet Take 20 mg by mouth daily as needed. For feet swelling      . potassium chloride SA (K-DUR,KLOR-CON) 20 MEQ tablet Take 1 tablet (20 mEq total) by mouth daily.  30 tablet  6  . traMADol (ULTRAM) 50 MG tablet Take 1-2 tablets every 4-6 hours as needed for cough  120 tablet  1  . warfarin (COUMADIN) 1 MG tablet Take 1 tablet (1 mg total) by mouth as directed. Take this dose until redirected by Anticoagulation clinic  60 tablet  3  . zolpidem (AMBIEN) 5 MG tablet Take 5 mg by mouth at bedtime as needed.        . ALPRAZolam (XANAX) 0.5 MG tablet Take 0.5 mg by mouth at bedtime as needed. For sleep/anxiety      . dextromethorphan (DELSYM) 30 MG/5ML liquid Take 60 mg by mouth as needed. For cough      . diphenhydrAMINE (BENADRYL) 25 MG tablet Take 25 mg by mouth every 6 (six) hours as needed.          Past  Medical History  Diagnosis Date  . Aortic insufficiency     with bicuspid aortic valve (the last MRI demonstrated the aortic root to be 4.8 x 4.7 cm.) Mod regurgitation. Normal chamber size with very mild left ventricular dysfunction.)  . Dyslipidemia   . Duodenal ulcer     with GI bleeding  . Hypertension   . Pericardial effusion 07/2009    admission to [facility], s/p pericardial window  . PNA (pneumonia) 10/2009    admnission to [facility], with right pleural effusion, s/p thoracentesis  . 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 Syncope 08/2009    admission for fever and syncope, no source for fever seen on cultures. Syncope thought to be r/t orthostasis  . Pleural effusion 10/2009    s/p thoracentesis  . Cough 06/28/2009    Dr. Sherene Sires  . GERD (gastroesophageal reflux disease) 03/23/2001    egd w/ dilation...Marland KitchenMarland KitchenArlyce Dice  . Acute pancreatitis     with pancreatic necrosis  . CHF (congestive heart failure)   .  Orthostasis 08/2009    Fever and syncope, no source for fever on cultures.  [name] Heart murmur   . Shortness of breath     "w/exerction"  . Pneumonia 2011    "twice"  . Anemia   . Blood transfusion   . Hemorrhoids   . GI bleed     Gastritis, ? Chem, Working for [employer]    Past Surgical History  Procedure Date  . Esophagogastroduodenoscopy 1990, 1999    Hot dog in throat (1987)/ Chicken in throat (1990)/ 01/06/97 (Dr. Kinnie Scales) Impaction, dilatation, Schatzki's Ring/ 07/1998 repeat Dilatation/ 01/10/01 stricture/dilated/ 03/08/05 Schatzki's Ring, dilated/ 06/01/08 Food Disimpaction [name] Bulb Ulcer (Dr. Ewing Schlein)  . Esophagogastroduodenoscopy 01/06/1997    (Dr. Kinnie Scales) Impaction, dilatation, Schatzki's Ring  . Valve replacement 06/2009    St. Jude mechanical valve per Dr. Laneta Simmers  . Doppler echocardiography 01/18/97    LVH, mild dilated aorta - root mod to severe aortic regurg EF 50-55%  . Esophagogastroduodenoscopy 08/09/98    Repeat dilatation  [name])  . Esophagogastroduodenoscopy 01/13/2001    stricture/ HH, duodenal ulcer / bleeding Marina Goodell)  . Esophagogastroduodenoscopy 12/25/01    esophagus stricture, dilated  . Esophagogastroduodenoscopy 03/23/2001    stricture, dilated  . Cardiolite ekg 03/10/2003    (-) ? small/apical infarct EF 50%  . Mri 2/05    LVH global hypokin EF 48% Mod A.I.  . Doppler echocardiography 09/27/03    No changes, moderate severe aortic regurg  . Doppler echocardiography 07/09/2004    Mod severe aortic regurg 2-3+ T.R., T.I.R., mild P..R.  . Esophagogastroduodenoscopy 03/08/05    Schatzki's Ring   03/13/05  Schatzki's Ring - dilated, duodenitis  . Doppler echocardiography 04/01/06    Hypokin Apex EF 55%  . Esophagogastroduodenoscopy 06/11/08    with food disimpaction.  Food impaction Ms HH  Sm bulb ulcer (Dr. Ewing Schlein)  . Doppler echocardiography 02/15/2010    Mild LVH, mild decr Sys fctn EF 40-45% Mild Dias Dysfctn Mech AV Triv AR, MR  . Esophagogastroduodenoscopy     stricture/HH, duodenal ulcer/bleeding Marina Goodell)  . Cardiac valve replacement 07/10/2009    AVR  . Hernia repair 2011    abdominal; post AVR    ROS:  As stated in the HPI and negative for all other systems.  PHYSICAL EXAM BP 126/75  Pulse 77  Ht 5\' 11"  (1.803 m)  Wt 186 lb (84.369 kg)  BMI 25.94 kg/m2 GENERAL:  Well appearing HEENT:  Pupils equal round and reactive, fundi not visualized, oral mucosa unremarkable NECK:  No jugular venous distention, waveform within normal limits, carotid upstroke brisk and symmetric, no bruits, no thyromegaly LYMPHATICS:  No cervical, inguinal adenopathy LUNGS:  Clear to auscultation bilaterally, right basilar crackles BACK:  No CVA tenderness CHEST:  Well healed sternotomy scar, percutaneous drain in place. HEART:  PMI not displaced or sustained,S1 within normal limits, mechanical S2 no S3, no S4, no clicks, no rubs, no murmurs ABD:  Flat, positive bowel sounds normal in frequency in pitch, no bruits,  no rebound, no guarding, no midline pulsatile mass, no hepatomegaly, no splenomegaly EXT:  2 plus pulses throughout, trace edema, no cyanosis no clubbing SKIN:  No rashes no nodules NEURO:  Cranial nerves II through XII grossly intact, motor grossly intact throughout PSYCH:  Cognitively intact, oriented to person place and time  ASSESSMENT AND PLAN

## 2010-12-25 NOTE — Assessment & Plan Note (Signed)
The blood pressure is at target. No change in medications is indicated. We will continue with therapeutic lifestyle changes (TLC).  

## 2010-12-25 NOTE — Patient Instructions (Addendum)
Follow up in 6 months with Dr Antoine Poche.  You will receive a letter in the mail 2 months before you are due.  Please call us when you receive this letter to schedule your follow up appointment.  Stop Bystolic and start carvedilol 5.62 mg twice a day.  Continue all other medications as listed.

## 2010-12-25 NOTE — Assessment & Plan Note (Signed)
No further imaging is indicated at this point.  We had an echo earlier this year.

## 2010-12-26 ENCOUNTER — Other Ambulatory Visit: Payer: Self-pay | Admitting: Cardiology

## 2010-12-26 MED ORDER — FERROUS SULFATE 324 (65 FE) MG PO TBEC: 1.0000 | DELAYED_RELEASE_TABLET | Freq: Two times a day (BID) | ORAL | Status: DC

## 2010-12-28 ENCOUNTER — Encounter (INDEPENDENT_AMBULATORY_CARE_PROVIDER_SITE_OTHER): Payer: Self-pay | Admitting: Surgery

## 2010-12-28 ENCOUNTER — Ambulatory Visit (INDEPENDENT_AMBULATORY_CARE_PROVIDER_SITE_OTHER): Payer: BC Managed Care – PPO | Admitting: *Deleted

## 2010-12-28 ENCOUNTER — Ambulatory Visit (INDEPENDENT_AMBULATORY_CARE_PROVIDER_SITE_OTHER): Payer: BC Managed Care – PPO | Admitting: Surgery

## 2010-12-28 VITALS — BP 118/80 | HR 70 | Temp 96.7°F | Resp 16 | Ht 71.0 in | Wt 190.4 lb

## 2010-12-28 DIAGNOSIS — I359 Nonrheumatic aortic valve disorder, unspecified: Secondary | ICD-10-CM

## 2010-12-28 DIAGNOSIS — I4891 Unspecified atrial fibrillation: Secondary | ICD-10-CM

## 2010-12-28 DIAGNOSIS — K651 Peritoneal abscess: Secondary | ICD-10-CM

## 2010-12-28 DIAGNOSIS — Z7901 Long term (current) use of anticoagulants: Secondary | ICD-10-CM

## 2010-12-28 DIAGNOSIS — Z954 Presence of other heart-valve replacement: Secondary | ICD-10-CM

## 2010-12-28 NOTE — Patient Instructions (Signed)
T scan to remove drain.  Follow up 1 month.

## 2010-12-28 NOTE — Progress Notes (Signed)
Subjective:     Patient ID: [name] [name], male   DOB: 06/27/1950, 60 y.o.   MRN: [id number]  HPI The patient returns to clinic today. He was in the hospital about a month ago due to an intra-abdominal abscess. This was percutaneously drained. He also had drainage from his duodenum were appeared to be pus noted on upper endoscopy prior to this. He's doing very well. His appetite is much better. He denies any abdominal pain.   Review of Systems  Constitutional: Negative for fever and chills.  HENT: Negative.   Gastrointestinal: Negative.   Neurological: Negative.   Hematological: Bruises/bleeds easily.       Objective:   Physical Exam  Constitutional: He appears well-developed and well-nourished.  HENT:  Head: Normocephalic and atraumatic.  Eyes: EOM are normal. Pupils are equal, round, and reactive to light.  Neck: Normal range of motion. Neck supple.  Abdominal: Soft. Bowel sounds are normal. There is no tenderness.       Drain right upper quadrant noted with minimal serous drainage       Assessment:     Intra-abdominal abscess status post percutaneous drainage    Plan:    I reviewed his most recent CT scan which shows the fluid collections been drained. He looks and feels better. The drainage does not appear to be enteric contents. There is no evidence of this being a fistula tract. I will send him for CT next week to see if we can remove his catheter. Follow up in one month.

## 2010-12-31 ENCOUNTER — Other Ambulatory Visit (INDEPENDENT_AMBULATORY_CARE_PROVIDER_SITE_OTHER): Payer: Self-pay | Admitting: General Surgery

## 2010-12-31 DIAGNOSIS — IMO0002 Reserved for concepts with insufficient information to code with codable children: Secondary | ICD-10-CM

## 2011-01-01 ENCOUNTER — Ambulatory Visit (INDEPENDENT_AMBULATORY_CARE_PROVIDER_SITE_OTHER): Payer: BC Managed Care – PPO | Admitting: Gastroenterology

## 2011-01-01 ENCOUNTER — Ambulatory Visit
Admission: RE | Admit: 2011-01-01 | Discharge: 2011-01-01 | Disposition: A | Discharge status: Home / Self Care | Payer: BC Managed Care – PPO | Source: Ambulatory Visit | Attending: Surgery | Admitting: Surgery

## 2011-01-01 ENCOUNTER — Other Ambulatory Visit (INDEPENDENT_AMBULATORY_CARE_PROVIDER_SITE_OTHER): Payer: BC Managed Care – PPO

## 2011-01-01 ENCOUNTER — Encounter: Payer: Self-pay | Admitting: Gastroenterology

## 2011-01-01 VITALS — BP 126/74 | HR 88 | Ht 71.0 in | Wt 187.0 lb

## 2011-01-01 DIAGNOSIS — D649 Anemia, unspecified: Secondary | ICD-10-CM

## 2011-01-01 DIAGNOSIS — K651 Peritoneal abscess: Secondary | ICD-10-CM

## 2011-01-01 DIAGNOSIS — K863 Pseudocyst of pancreas: Secondary | ICD-10-CM

## 2011-01-01 DIAGNOSIS — IMO0002 Reserved for concepts with insufficient information to code with codable children: Secondary | ICD-10-CM

## 2011-01-01 LAB — CBC WITH DIFFERENTIAL/PLATELET
Eosinophils Absolute: 0.2 10*3/uL (ref 0.0–0.7)
Eosinophils Relative: 3.1 % (ref 0.0–5.0)
MCV: 93 fl (ref 78.0–100.0)
Monocytes Absolute: 0.9 10*3/uL (ref 0.1–1.0)
Neutrophils Relative %: 54.9 % (ref 43.0–77.0)
Platelets: 168 10*3/uL (ref 150.0–400.0)
WBC: 7.2 10*3/uL (ref 4.5–10.5)

## 2011-01-01 MED ORDER — IOHEXOL 300 MG/ML  SOLN
100.0000 mL | Freq: Once | INTRAMUSCULAR | Status: AC | PRN
  Administered 2011-01-01: 100 mL via INTRAVENOUS

## 2011-01-01 NOTE — Assessment & Plan Note (Addendum)
Hemoccult-positive stool and anemia suggest a chronic GI blood loss. Recent upper GI endoscopy was negative. No gross blood was seen from  his drainage.  A colonic bleeding source should be ruled out.  Recommendations #1 colonoscopy. I will check with Dr. Antoine Poche to see whether we can hold his Coumadin #2 repeat CBC

## 2011-01-01 NOTE — Progress Notes (Signed)
History of Present Illness:  [name] [name] has returned following hospitalization for anemia  and percutaneous drainage of a large intra-abdominal abscess complicating a pancreatic pseudocyst. He was Hemoccult positive and received 2 units of packed cells. Upper endoscopy was unrevealing for a source of GI blood loss. Today's followup CT scan demonstrated resolution of the abscess and his percutaneous catheter was pulled. He has no GI complaints including pain, melena or hematochezia. Altogether he is feeling quite improved with an excellent appetite.    Review of Systems: Pertinent positive and negative review of systems were noted in the above HPI section. All other review of systems were otherwise negative.    Current Medications, Allergies, Past Medical History, Past Surgical History, Family History and Social History were reviewed in [employer] [facility] electronic medical record  Vital signs were reviewed in today's medical record. Physical Exam: General: Well developed , well nourished, no acute distress

## 2011-01-01 NOTE — Patient Instructions (Signed)
You will go to the basement for labs today You will need to call back to schedule your Colonoscopy in February We will check with Dr Antoine Poche  About holding your Coumadin

## 2011-01-01 NOTE — Assessment & Plan Note (Signed)
Status post percutaneous drainage with resolution of abscess

## 2011-01-02 ENCOUNTER — Telehealth: Payer: Self-pay | Admitting: Pharmacist

## 2011-01-02 NOTE — Telephone Encounter (Signed)
Pt pending colonoscopy by Dr. [name].  Per pt's wife- colonoscopy not scheduled until February.  Dr. Antoine Poche has cleared pt to hold Coumadin but will need Lovenox bridge.  She is aware to call our office once a date is set so we can set up his Lovenox bridge.

## 2011-01-08 ENCOUNTER — Ambulatory Visit (INDEPENDENT_AMBULATORY_CARE_PROVIDER_SITE_OTHER): Payer: BC Managed Care – PPO | Admitting: *Deleted

## 2011-01-08 DIAGNOSIS — I359 Nonrheumatic aortic valve disorder, unspecified: Secondary | ICD-10-CM

## 2011-01-08 DIAGNOSIS — Z954 Presence of other heart-valve replacement: Secondary | ICD-10-CM

## 2011-01-08 DIAGNOSIS — I4891 Unspecified atrial fibrillation: Secondary | ICD-10-CM

## 2011-01-08 DIAGNOSIS — Z7901 Long term (current) use of anticoagulants: Secondary | ICD-10-CM

## 2011-01-08 LAB — POCT INR: INR: 1.2

## 2011-01-11 ENCOUNTER — Other Ambulatory Visit: Payer: Self-pay

## 2011-01-11 MED ORDER — FUROSEMIDE 20 MG PO TABS: 20.0000 mg | ORAL_TABLET | Freq: Every day | ORAL | Status: DC | PRN

## 2011-01-11 NOTE — Telephone Encounter (Signed)
..   Requested Prescriptions   Signed Prescriptions Disp Refills  . furosemide (LASIX) 20 MG tablet 30 tablet 11    Sig: Take 1 tablet (20 mg total) by mouth daily as needed. For feet swelling    Authorizing Provider: Rollene Rotunda    Ordering User: Lacie Scotts   E-scribe to [place] [facility].

## 2011-01-17 ENCOUNTER — Encounter: Payer: BC Managed Care – PPO | Admitting: *Deleted

## 2011-01-18 ENCOUNTER — Ambulatory Visit (INDEPENDENT_AMBULATORY_CARE_PROVIDER_SITE_OTHER): Payer: BC Managed Care – PPO | Admitting: *Deleted

## 2011-01-18 DIAGNOSIS — I4891 Unspecified atrial fibrillation: Secondary | ICD-10-CM

## 2011-01-18 DIAGNOSIS — Z7901 Long term (current) use of anticoagulants: Secondary | ICD-10-CM

## 2011-01-18 DIAGNOSIS — I359 Nonrheumatic aortic valve disorder, unspecified: Secondary | ICD-10-CM

## 2011-01-18 DIAGNOSIS — Z954 Presence of other heart-valve replacement: Secondary | ICD-10-CM

## 2011-01-18 LAB — POCT INR: INR: 1.3

## 2011-01-28 ENCOUNTER — Ambulatory Visit (INDEPENDENT_AMBULATORY_CARE_PROVIDER_SITE_OTHER): Payer: BC Managed Care – PPO | Admitting: *Deleted

## 2011-01-28 DIAGNOSIS — I4891 Unspecified atrial fibrillation: Secondary | ICD-10-CM

## 2011-01-28 DIAGNOSIS — Z7901 Long term (current) use of anticoagulants: Secondary | ICD-10-CM

## 2011-01-28 DIAGNOSIS — I359 Nonrheumatic aortic valve disorder, unspecified: Secondary | ICD-10-CM

## 2011-01-28 DIAGNOSIS — Z954 Presence of other heart-valve replacement: Secondary | ICD-10-CM

## 2011-02-06 ENCOUNTER — Ambulatory Visit (INDEPENDENT_AMBULATORY_CARE_PROVIDER_SITE_OTHER): Payer: BC Managed Care – PPO | Admitting: Emergency Medicine

## 2011-02-06 DIAGNOSIS — I359 Nonrheumatic aortic valve disorder, unspecified: Secondary | ICD-10-CM

## 2011-02-06 DIAGNOSIS — I4891 Unspecified atrial fibrillation: Secondary | ICD-10-CM

## 2011-02-06 DIAGNOSIS — Z7901 Long term (current) use of anticoagulants: Secondary | ICD-10-CM

## 2011-02-06 DIAGNOSIS — Z954 Presence of other heart-valve replacement: Secondary | ICD-10-CM

## 2011-02-20 ENCOUNTER — Ambulatory Visit (INDEPENDENT_AMBULATORY_CARE_PROVIDER_SITE_OTHER): Payer: BC Managed Care – PPO | Admitting: Emergency Medicine

## 2011-02-20 DIAGNOSIS — I4891 Unspecified atrial fibrillation: Secondary | ICD-10-CM

## 2011-02-20 DIAGNOSIS — I359 Nonrheumatic aortic valve disorder, unspecified: Secondary | ICD-10-CM

## 2011-02-20 DIAGNOSIS — Z7901 Long term (current) use of anticoagulants: Secondary | ICD-10-CM

## 2011-02-20 DIAGNOSIS — Z954 Presence of other heart-valve replacement: Secondary | ICD-10-CM

## 2011-02-20 LAB — POCT INR: INR: 2.1

## 2011-02-21 ENCOUNTER — Ambulatory Visit (INDEPENDENT_AMBULATORY_CARE_PROVIDER_SITE_OTHER): Payer: BC Managed Care – PPO | Admitting: Surgery

## 2011-02-21 ENCOUNTER — Encounter (INDEPENDENT_AMBULATORY_CARE_PROVIDER_SITE_OTHER): Payer: Self-pay | Admitting: Surgery

## 2011-02-21 VITALS — BP 130/82 | HR 83 | Temp 97.2°F | Ht 71.0 in | Wt 209.0 lb

## 2011-02-21 DIAGNOSIS — Z87898 Personal history of other specified conditions: Secondary | ICD-10-CM

## 2011-02-21 NOTE — Progress Notes (Signed)
Subjective:     Patient ID: [name] [name], male   DOB: 02/22/1950, 61 y.o.   MRN: [id number]  HPI Pt looks great. No abdominal pain.  Tired.   Review of Systems  Constitutional: Positive for fatigue.  HENT: Negative.   Eyes: Negative.   Respiratory: Negative.   Gastrointestinal: Positive for blood in stool. Negative for abdominal pain.  Genitourinary: Negative.        Objective:   Physical Exam  Constitutional: He appears well-developed and well-nourished.  HENT:  Head: Normocephalic and atraumatic.  Eyes: EOM are normal. Pupils are equal, round, and reactive to light.  Neck: Normal range of motion. Neck supple.  Abdominal: Soft. Bowel sounds are normal. There is no tenderness.       Assessment:       Intraabdominal abscess status post drainage  Doing well Plan:     Return in 6 months.

## 2011-02-21 NOTE — Patient Instructions (Signed)
Follow-up 6 months

## 2011-02-26 ENCOUNTER — Other Ambulatory Visit: Payer: Self-pay | Admitting: Pharmacist

## 2011-02-26 MED ORDER — WARFARIN SODIUM 1 MG PO TABS: 1.0000 mg | ORAL_TABLET | ORAL | Status: DC

## 2011-03-13 ENCOUNTER — Ambulatory Visit (INDEPENDENT_AMBULATORY_CARE_PROVIDER_SITE_OTHER): Payer: BC Managed Care – PPO | Admitting: Emergency Medicine

## 2011-03-13 DIAGNOSIS — I359 Nonrheumatic aortic valve disorder, unspecified: Secondary | ICD-10-CM

## 2011-03-13 DIAGNOSIS — I4891 Unspecified atrial fibrillation: Secondary | ICD-10-CM

## 2011-03-13 DIAGNOSIS — Z7901 Long term (current) use of anticoagulants: Secondary | ICD-10-CM

## 2011-03-13 DIAGNOSIS — Z954 Presence of other heart-valve replacement: Secondary | ICD-10-CM

## 2011-04-02 ENCOUNTER — Other Ambulatory Visit: Payer: Self-pay

## 2011-04-02 ENCOUNTER — Other Ambulatory Visit: Payer: Self-pay | Admitting: *Deleted

## 2011-04-02 MED ORDER — POTASSIUM CHLORIDE CRYS ER 20 MEQ PO TBCR: 20.0000 meq | EXTENDED_RELEASE_TABLET | Freq: Every day | ORAL | Status: DC

## 2011-04-17 ENCOUNTER — Ambulatory Visit (INDEPENDENT_AMBULATORY_CARE_PROVIDER_SITE_OTHER): Payer: BC Managed Care – PPO

## 2011-04-17 DIAGNOSIS — I4891 Unspecified atrial fibrillation: Secondary | ICD-10-CM

## 2011-04-17 DIAGNOSIS — Z954 Presence of other heart-valve replacement: Secondary | ICD-10-CM

## 2011-04-17 DIAGNOSIS — I359 Nonrheumatic aortic valve disorder, unspecified: Secondary | ICD-10-CM

## 2011-04-17 DIAGNOSIS — Z7901 Long term (current) use of anticoagulants: Secondary | ICD-10-CM

## 2011-04-17 LAB — POCT INR: INR: 2.6

## 2011-05-29 ENCOUNTER — Ambulatory Visit (INDEPENDENT_AMBULATORY_CARE_PROVIDER_SITE_OTHER): Payer: BC Managed Care – PPO

## 2011-05-29 DIAGNOSIS — Z954 Presence of other heart-valve replacement: Secondary | ICD-10-CM

## 2011-05-29 DIAGNOSIS — I359 Nonrheumatic aortic valve disorder, unspecified: Secondary | ICD-10-CM

## 2011-05-29 DIAGNOSIS — Z7901 Long term (current) use of anticoagulants: Secondary | ICD-10-CM

## 2011-05-29 DIAGNOSIS — I4891 Unspecified atrial fibrillation: Secondary | ICD-10-CM

## 2011-06-14 ENCOUNTER — Telehealth: Payer: Self-pay | Admitting: Gastroenterology

## 2011-06-19 ENCOUNTER — Telehealth: Payer: Self-pay | Admitting: Gastroenterology

## 2011-06-19 NOTE — Telephone Encounter (Signed)
See additional phone note. 

## 2011-06-19 NOTE — Telephone Encounter (Signed)
Pt was scheduled for previous colon but appt had to be cancelled due to Dr. Marzetta Board surgery. Pt rescheduled for previsit 06/27/11@10 :30am, colon scheduled for 07/11/11@2 :30pm. Pt knows to contact coumadin clinic for lovenox bridge with coumadin for procedure. Pt aware of appt date and time.

## 2011-06-27 ENCOUNTER — Ambulatory Visit (AMBULATORY_SURGERY_CENTER): Payer: BC Managed Care – PPO | Admitting: *Deleted

## 2011-06-27 VITALS — Ht 71.0 in | Wt 234.6 lb

## 2011-06-27 DIAGNOSIS — D649 Anemia, unspecified: Secondary | ICD-10-CM

## 2011-06-27 MED ORDER — PEG-KCL-NACL-NASULF-NA ASC-C 100 G PO SOLR: ORAL | Status: DC

## 2011-06-28 ENCOUNTER — Other Ambulatory Visit: Payer: Self-pay | Admitting: *Deleted

## 2011-07-02 ENCOUNTER — Other Ambulatory Visit: Payer: Self-pay

## 2011-07-02 MED ORDER — FLUTICASONE PROPIONATE 50 MCG/ACT NA SUSP: 2.0000 | Freq: Every day | NASAL | Status: DC | PRN

## 2011-07-02 NOTE — Telephone Encounter (Signed)
Pt wife request refill flonase sent to [facility] pharmacy. Pulmonary doctor  usually prescribes but pt does not have to see him any more. Please advise.

## 2011-07-02 NOTE — Telephone Encounter (Signed)
Sent!

## 2011-07-03 ENCOUNTER — Ambulatory Visit (INDEPENDENT_AMBULATORY_CARE_PROVIDER_SITE_OTHER): Payer: BC Managed Care – PPO

## 2011-07-03 DIAGNOSIS — Z954 Presence of other heart-valve replacement: Secondary | ICD-10-CM

## 2011-07-03 DIAGNOSIS — I4891 Unspecified atrial fibrillation: Secondary | ICD-10-CM

## 2011-07-03 DIAGNOSIS — Z7901 Long term (current) use of anticoagulants: Secondary | ICD-10-CM

## 2011-07-03 DIAGNOSIS — I359 Nonrheumatic aortic valve disorder, unspecified: Secondary | ICD-10-CM

## 2011-07-03 MED ORDER — ENOXAPARIN SODIUM 100 MG/ML ~~LOC~~ SOLN: 100.0000 mg | Freq: Two times a day (BID) | SUBCUTANEOUS | Status: DC

## 2011-07-03 NOTE — Patient Instructions (Addendum)
Take your last dose of Coumadin on 07/05/11 prior to procedure on 07/11/11. 07/06/11 No Coumadin, No Lovenox 07/07/11 Start taking Lovenox 100mg  injection twice daily (every 12 hrs) in the am, and in the pm. 07/08/11 Lovenox 100mg  injection in the am and in the pm. 07/09/11 Lovenox 100mg  injection in the am and in the pm. 07/10/11 Take your last dosage of Lovenox 100mg  in the am prior, No Lovenox in the pm prior to procedure on 07/11/11. Restart Coumadin and Lovenox post procedure when GI MD tells you it is safe to resume.  Take Coumadin 3mg  x 3 days, then resume previous dosage of 2mg  daily except 3mg  on Mondays, Wednesdays, and Fridays.  Restart Lovenox twice daily (every 12 hrs), until recheck and INR is >2.0.  Recheck INR on 07/16/11.

## 2011-07-07 IMAGING — CR DG CHEST 1V PORT
1 series · 1 of 1 positions shown · non-contrast
Comparison: Portable exam 6666 hours compared to 09/11/2009

CLINICAL DATA: Shortness of breath, pancreatitis

PORTABLE CHEST - 1 VIEW

[AP]
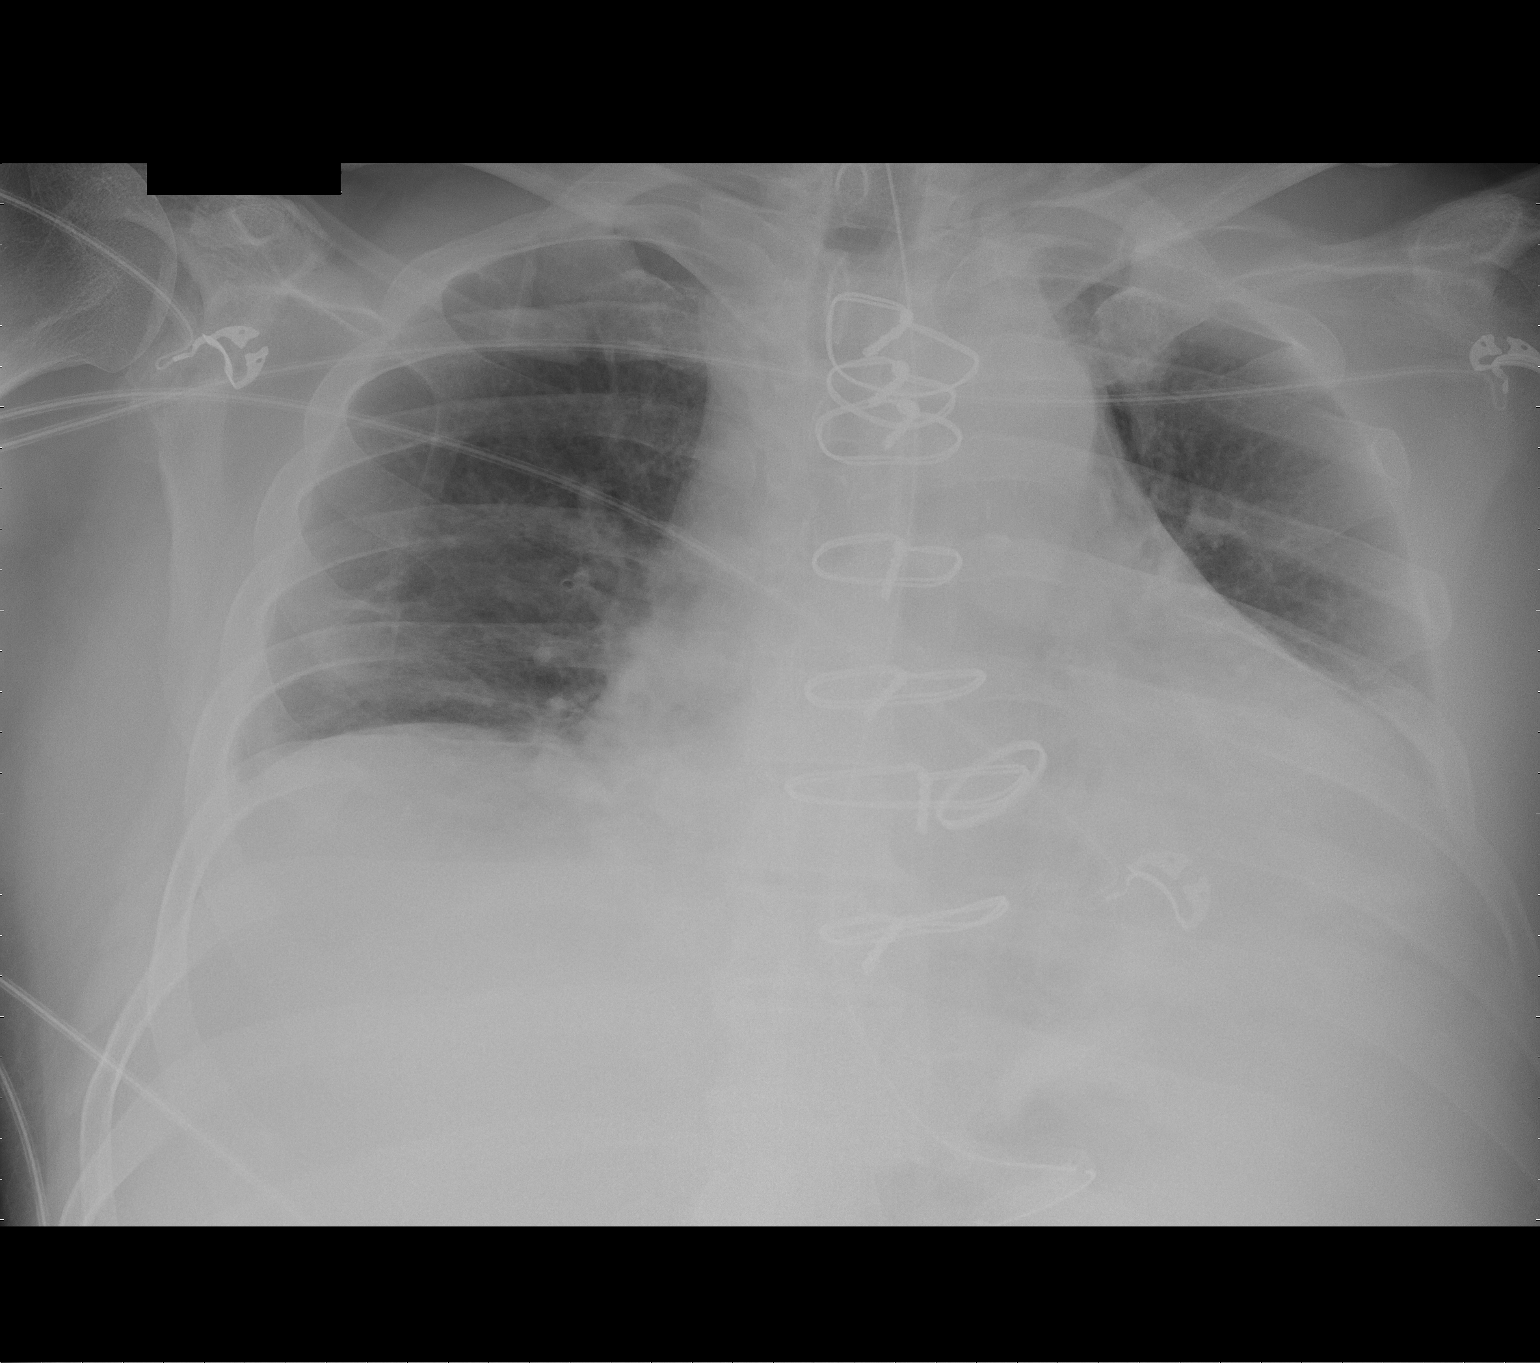

[1 of 1 positions shown; findings below may reference images not displayed]

FINDINGS: Nasogastric tube extends into stomach.
Enlargement of cardiac silhouette post median sternotomy and AVR.
Low lung volumes with bibasilar atelectasis, cannot completely
exclude left lower lobe consolidation.
Upper lungs clear.
Bones unremarkable.
IMPRESSION: Cardiac silhouette enlargement post AVR.
Low lung volumes with bibasilar atelectasis, cannot exclude left
lower lobe consolidation.

## 2011-07-08 IMAGING — CR DG CHEST 1V PORT
1 series · 1 of 1 positions shown · non-contrast
Comparison: Portable exam 3598 hours compared to 10/09/2009

CLINICAL DATA: Shortness of breath, pancreatitis

PORTABLE CHEST - 1 VIEW

[AP]
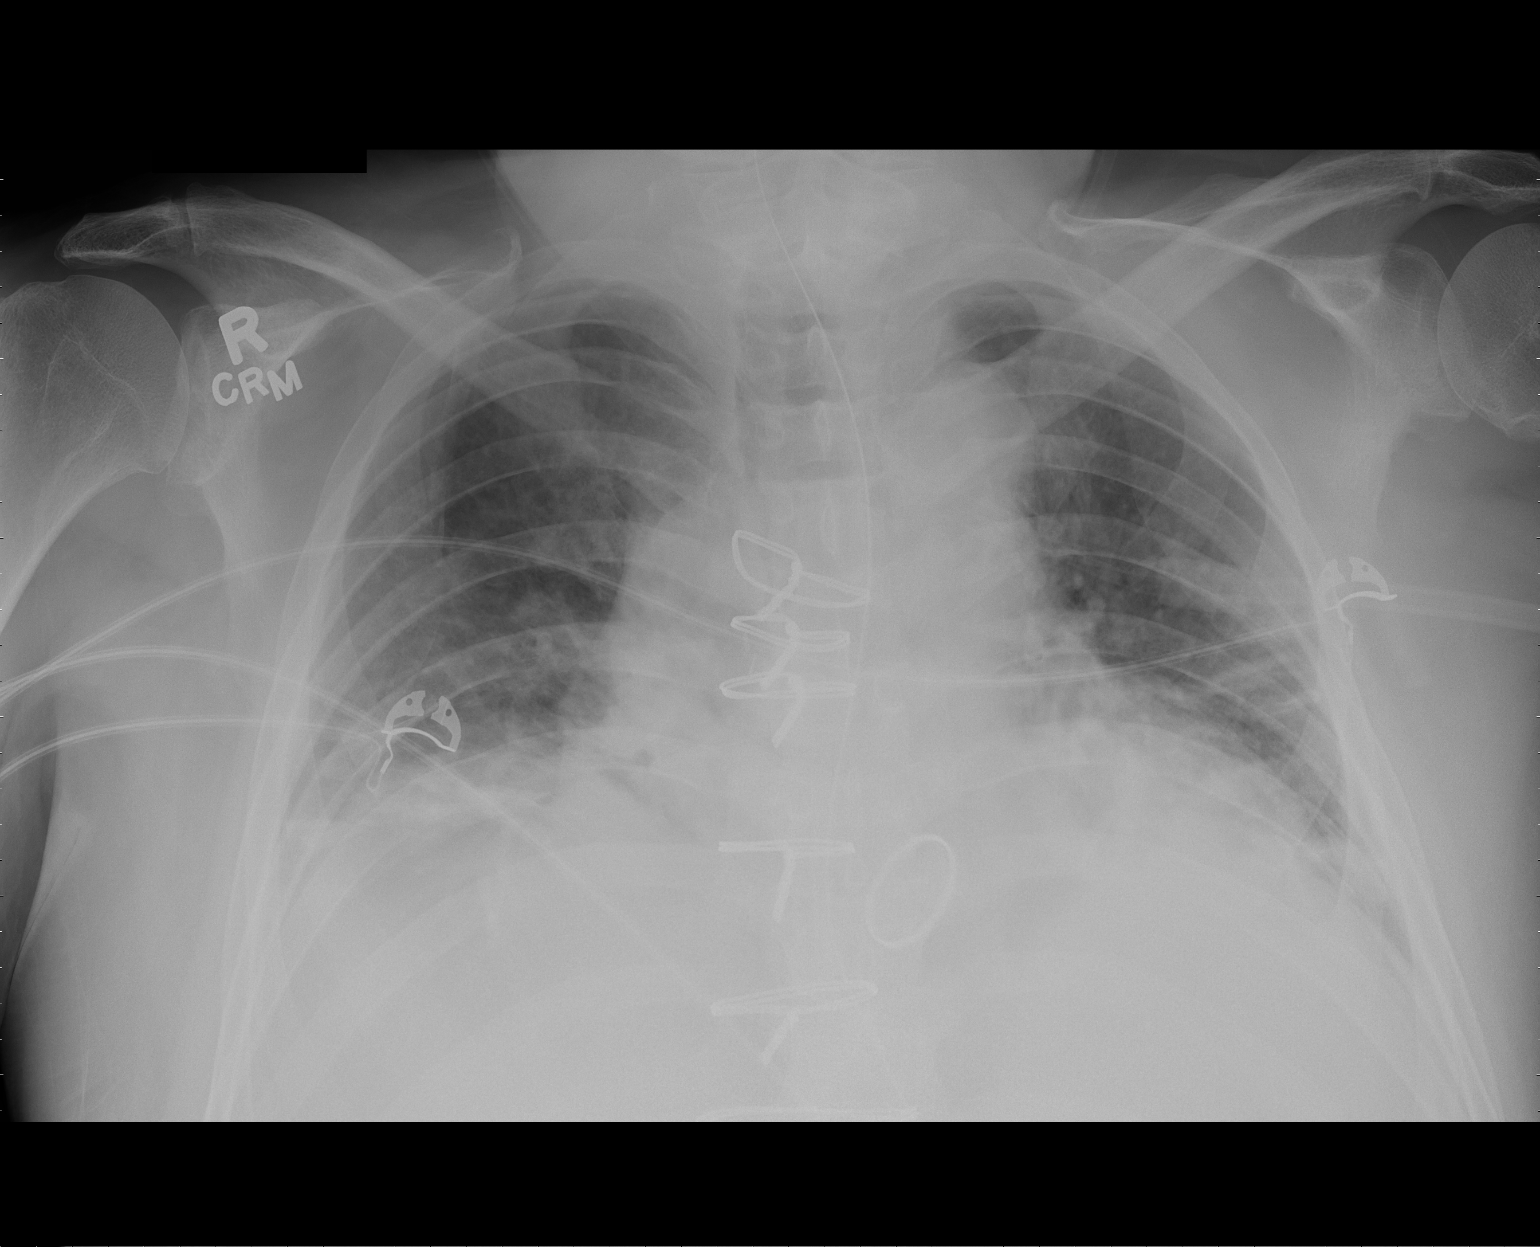

[1 of 1 positions shown; findings below may reference images not displayed]

FINDINGS: Nasogastric tube extends into abdomen.
Enlargement of cardiac silhouette post AVR.
Tortuous aorta.
Low lung volumes with bibasilar atelectasis.
No pneumothorax.
IMPRESSION: Enlargement of cardiac silhouette post AVR.
Bibasilar atelectasis.

## 2011-07-08 IMAGING — CR DG CHEST 1V PORT
1 series · 1 of 1 positions shown · non-contrast
Comparison: 10/10/2009

CLINICAL DATA: Line placement.

PORTABLE CHEST - 1 VIEW

[AP]
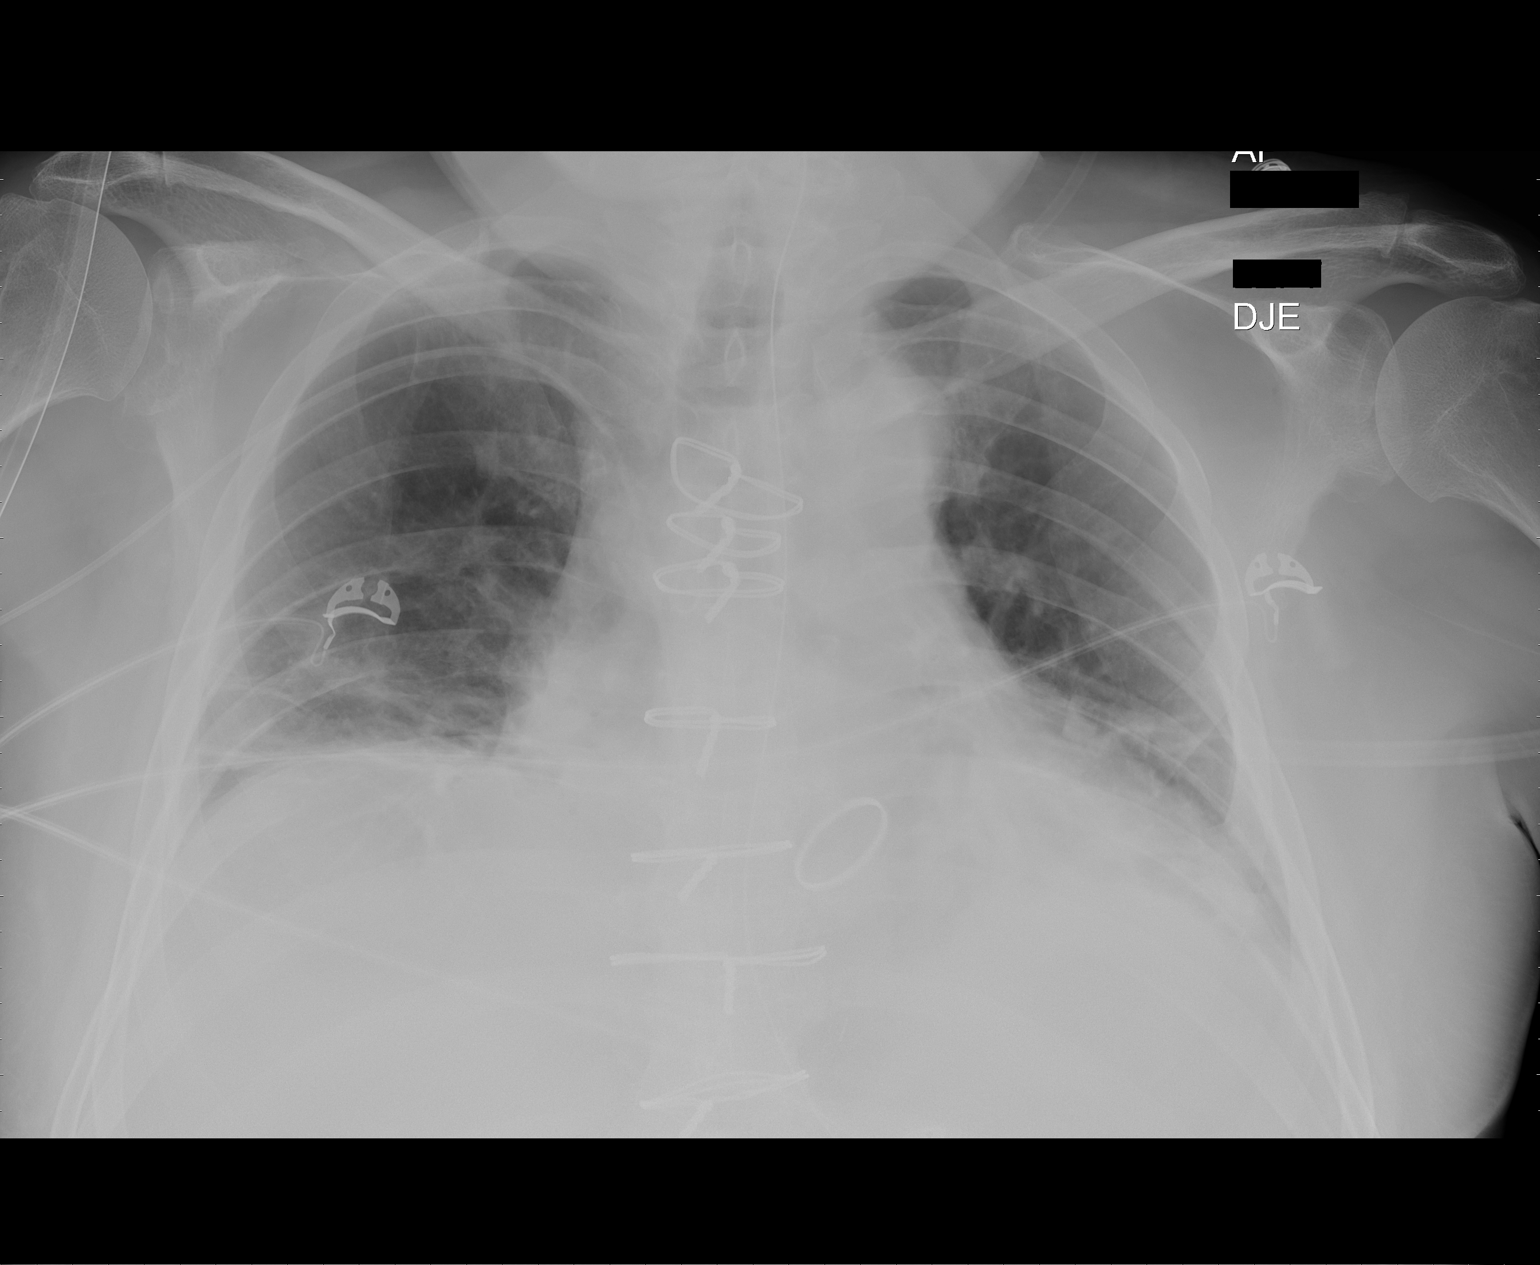

[1 of 1 positions shown; findings below may reference images not displayed]

FINDINGS: A left central venous catheter is identified with tip
overlying the right atrium - consider 2-3 cm retraction.
This is a low-volume film with moderate bibasilar
atelectasis/airspace disease.
Cardiomegaly and cardiac valve replacement again noted.
There is no evidence of pneumothorax.
An NG tube is identified with tip overlying the stomach.
IMPRESSION: Left venous catheter with tip overlying the right atrium -
considered 2-3 cm retraction.

Low-volume film with moderate bibasilar atelectasis/airspace
disease.

## 2011-07-09 IMAGING — CR DG ABD PORTABLE 2V
2 series · 2 of 2 positions shown · non-contrast
Comparison: The 10/09/2009

CLINICAL DATA: Small bowel obstruction.  Abdominal pain.

ABDOMEN - 2 VIEW

[view not recorded (1 of 2)]
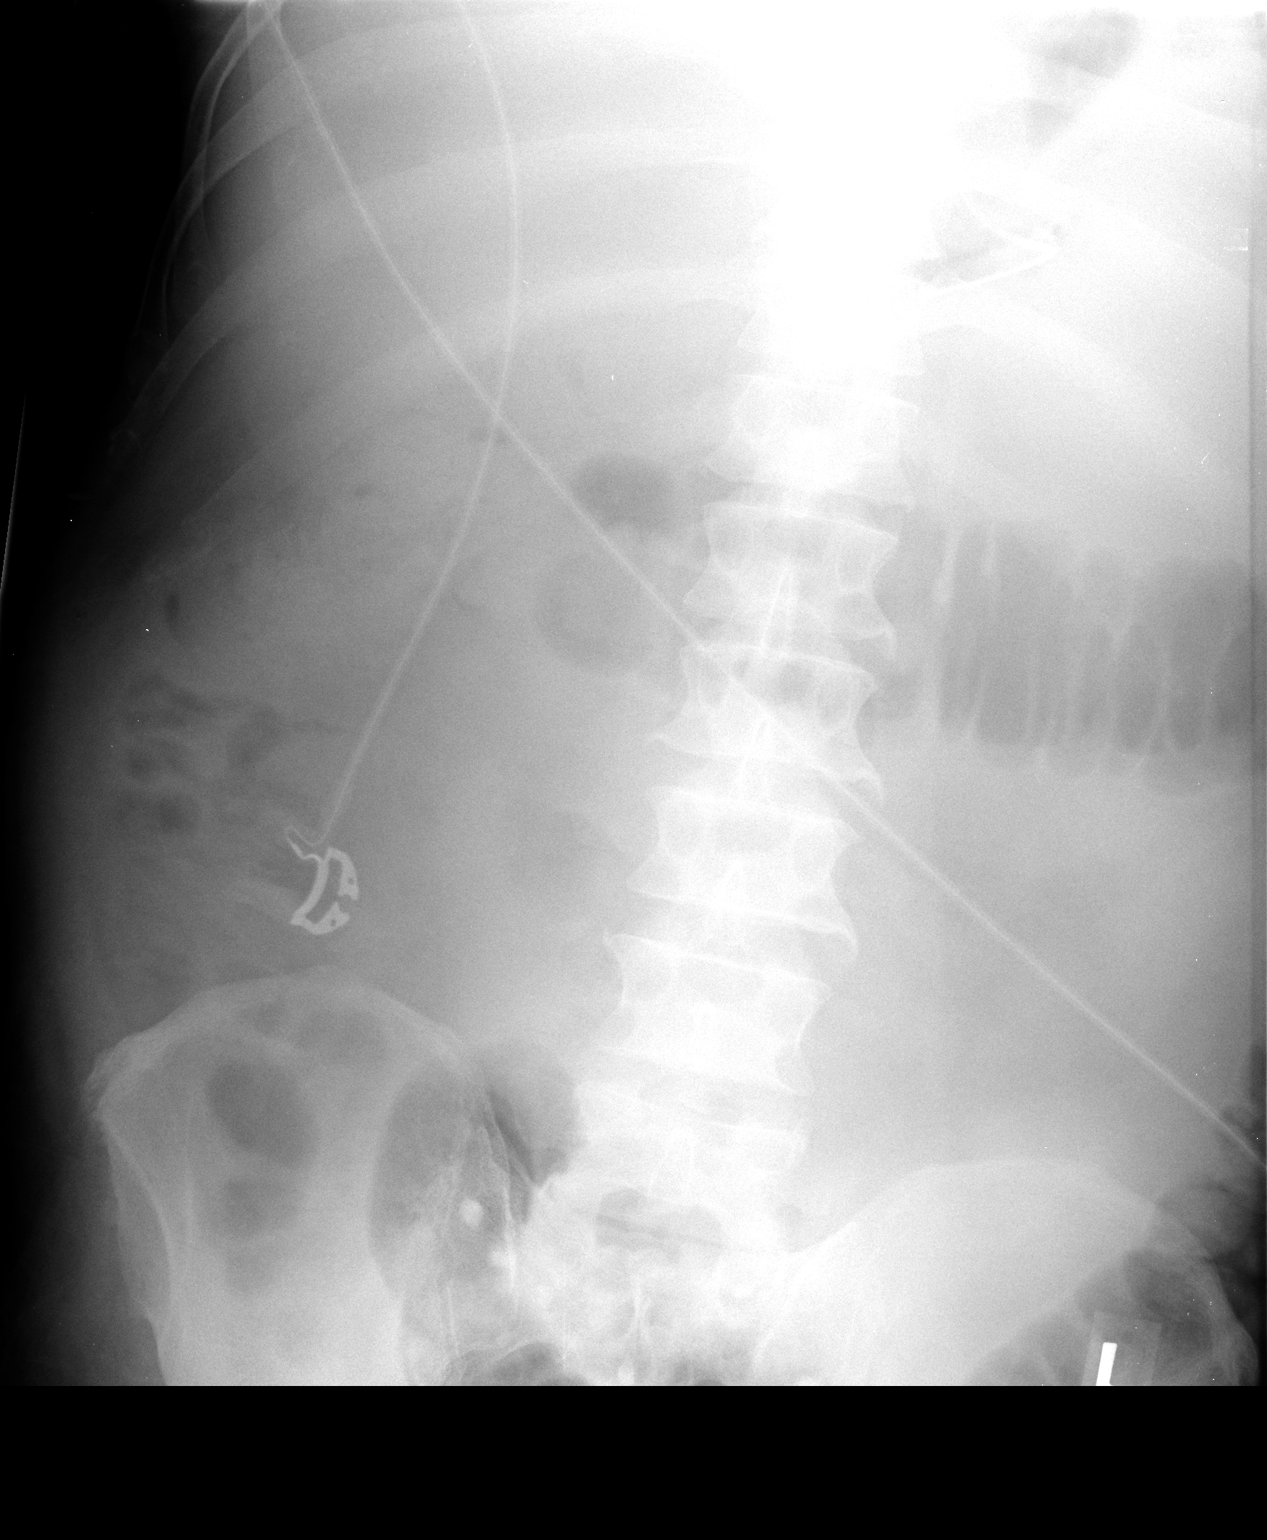

[view not recorded (2 of 2)]
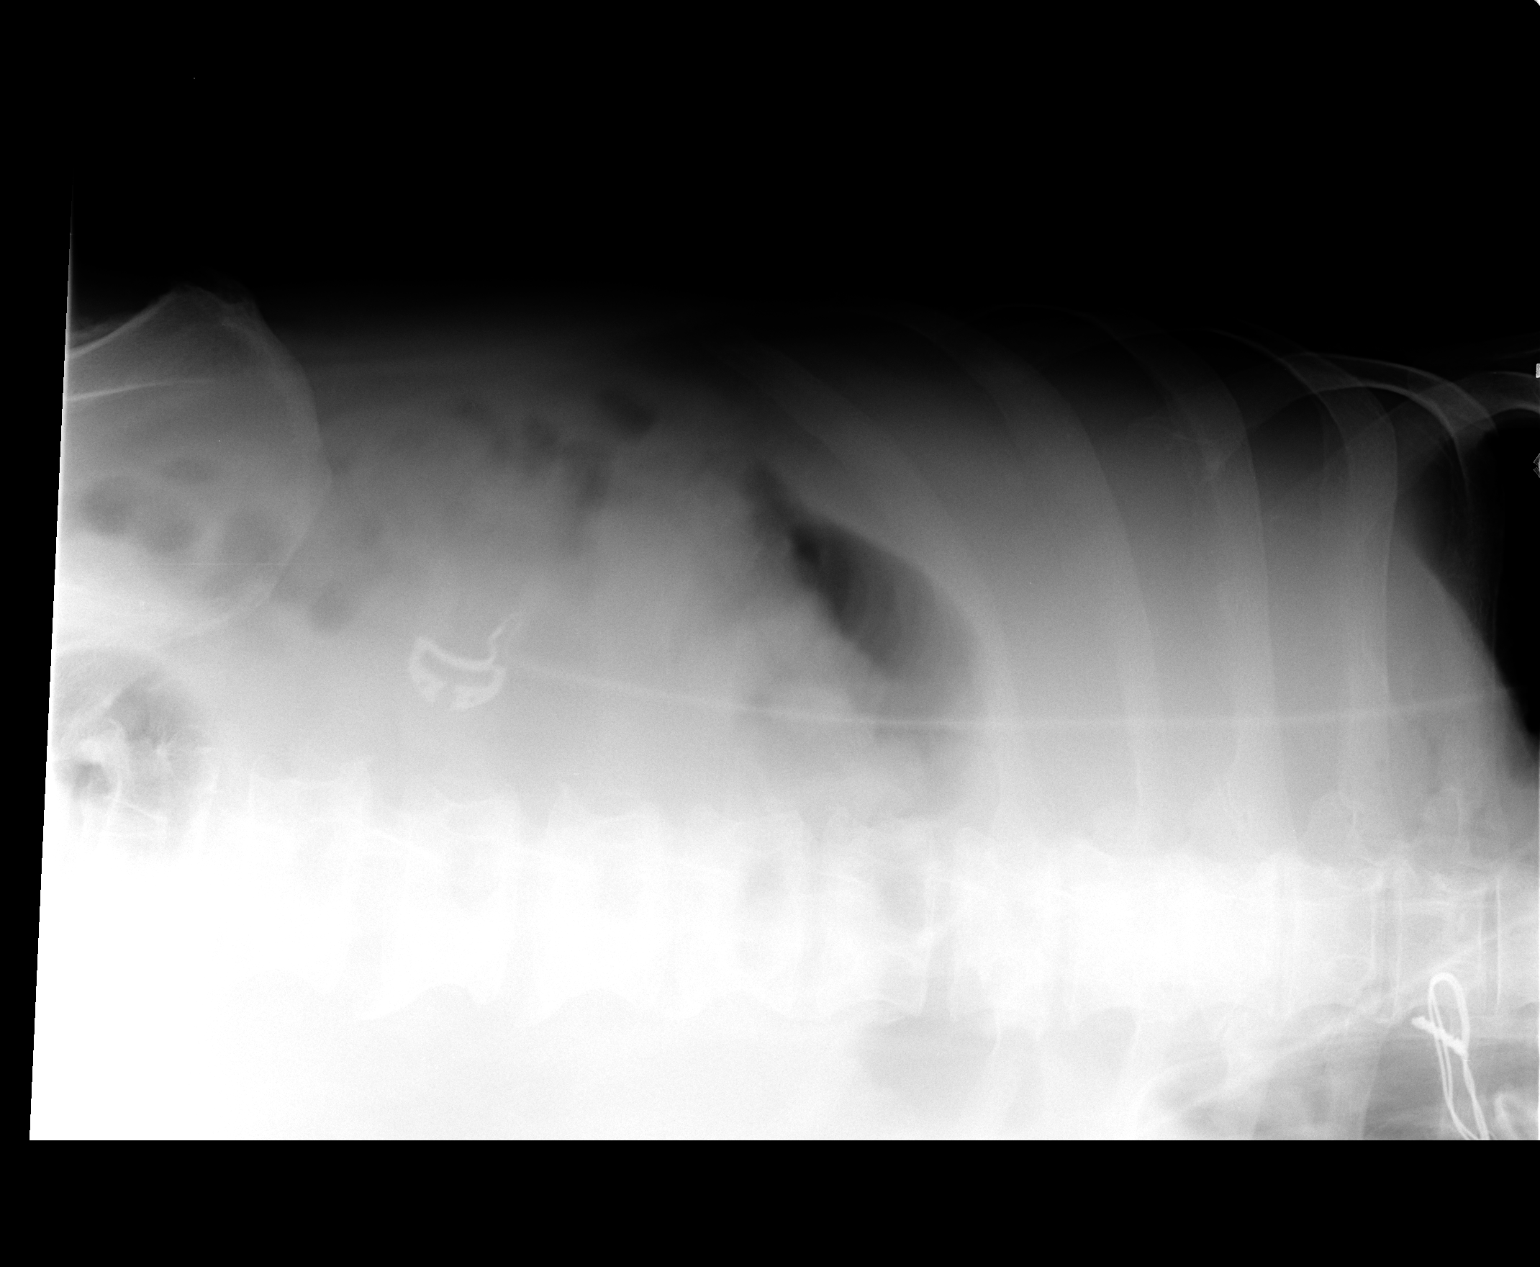

[2 of 2 positions shown; findings below may reference images not displayed]

FINDINGS: Small bowel distention has resolved since prior study.
There is mild gaseous distention of the transverse colon and left
side of the colon.  Question mild ileus.  Contrast material seen
within the sigmoid diverticula.  No free air.  NG tube is present
in the stomach.
IMPRESSION: No current evidence for small bowel obstruction.  Mild gaseous
distention of colon could reflect mild ileus.

## 2011-07-11 ENCOUNTER — Encounter: Payer: Self-pay | Admitting: Gastroenterology

## 2011-07-11 ENCOUNTER — Ambulatory Visit (AMBULATORY_SURGERY_CENTER): Payer: BC Managed Care – PPO | Admitting: Internal Medicine

## 2011-07-11 VITALS — BP 147/79 | HR 62 | Temp 97.5°F | Resp 18 | Ht 71.0 in | Wt 234.0 lb

## 2011-07-11 DIAGNOSIS — D649 Anemia, unspecified: Secondary | ICD-10-CM

## 2011-07-11 DIAGNOSIS — R195 Other fecal abnormalities: Secondary | ICD-10-CM

## 2011-07-11 DIAGNOSIS — Z7901 Long term (current) use of anticoagulants: Secondary | ICD-10-CM

## 2011-07-11 MED ORDER — SODIUM CHLORIDE 0.9 % IV SOLN: 500.0000 mL | INTRAVENOUS | Status: DC

## 2011-07-11 NOTE — Patient Instructions (Addendum)
Impressions/recommendations:  Diverticulosis (handout given) Hemorrhoids (handout given)  Resume coumadin and Lovenox starting today as advised by your coumadin clinic.  Repeat colonoscopy in 10 years.  YOU HAD AN ENDOSCOPIC PROCEDURE TODAY AT THE [facility]: Refer to the procedure report that was given to you for any specific questions about what was found during the examination.  If the procedure report does not answer your questions, please call your gastroenterologist to clarify.  If you requested that your care partner not be given the details of your procedure findings, then the procedure report has been included in a sealed envelope for you to review at your convenience later.  YOU SHOULD EXPECT: Some feelings of bloating in the abdomen. Passage of more gas than usual.  Walking can help get rid of the air that was put into your GI tract during the procedure and reduce the bloating. If you had a lower endoscopy (such as a colonoscopy or flexible sigmoidoscopy) you may notice spotting of blood in your stool or on the toilet paper. If you underwent a bowel prep for your procedure, then you may not have a normal bowel movement for a few days.  DIET: Your first meal following the procedure should be a light meal and then it is ok to progress to your normal diet.  A half-sandwich or bowl of soup is an example of a good first meal.  Heavy or fried foods are harder to digest and may make you feel nauseous or bloated.  Likewise meals heavy in dairy and vegetables can cause extra gas to form and this can also increase the bloating.  Drink plenty of fluids but you should avoid alcoholic beverages for 24 hours.  ACTIVITY: Your care partner should take you home directly after the procedure.  You should plan to take it easy, moving slowly for the rest of the day.  You can resume normal activity the day after the procedure however you should NOT DRIVE or use heavy machinery for 24 hours (because  of the sedation medicines used during the test).    SYMPTOMS TO REPORT IMMEDIATELY: A gastroenterologist can be reached at any hour.  During normal business hours, 8:30 AM to 5:00 PM Monday through Friday, call [phone].  After hours and on weekends, please call the GI answering service at [phone] who will take a message and have the physician on call contact you.   Following lower endoscopy (colonoscopy or flexible sigmoidoscopy):  Excessive amounts of blood in the stool  Significant tenderness or worsening of abdominal pains  Swelling of the abdomen that is new, acute  Fever of 100F or higher  Following upper endoscopy (EGD)  Vomiting of blood or coffee ground material  New chest pain or pain under the shoulder blades  Painful or persistently difficult swallowing  New shortness of breath  Fever of 100F or higher  Black, tarry-looking stools  FOLLOW UP: If any biopsies were taken you will be contacted by phone or by letter within the next 1-3 weeks.  Call your gastroenterologist if you have not heard about the biopsies in 3 weeks.  Our staff will call the home number listed on your records the next business day following your procedure to check on you and address any questions or concerns that you may have at that time regarding the information given to you following your procedure. This is a courtesy call and so if there is no answer at the home number and we have not heard  from you through the emergency physician on call, we will assume that you have returned to your regular daily activities without incident.  SIGNATURES/CONFIDENTIALITY: You and/or your care partner have signed paperwork which will be entered into your electronic medical record.  These signatures attest to the fact that that the information above on your After Visit Summary has been reviewed and is understood.  Full responsibility of the confidentiality of this discharge information lies with you and/or  your care-partner.

## 2011-07-11 NOTE — Progress Notes (Signed)
Patient did not experience any of the following events: a burn prior to discharge; a fall within the facility; wrong site/side/patient/procedure/implant event; or a hospital transfer or hospital admission upon discharge from the facility. (G8907) Patient did not have preoperative order for IV antibiotic SSI prophylaxis. (G8918)  

## 2011-07-11 NOTE — Op Note (Signed)
[facility] [address] [place], [place]  [address]  COLONOSCOPY PROCEDURE REPORT  PATIENT:  [name] [name], [name] [name]  MR#:  [id number] BIRTHDATE:  17-Aug-1950, 61 yrs. old  GENDER:  male ENDOSCOPIST:  Carie Caddy. Jackalyn Haith, MD  PROCEDURE DATE:  07/11/2011 PROCEDURE:  Colonoscopy 24401 ASA CLASS:  Class III INDICATIONS:  heme positive stool, Anemia MEDICATIONS:   MAC sedation, administered by CRNA, propofol (Diprivan) 200 mg IV  DESCRIPTION OF PROCEDURE:   After the risks benefits and alternatives of the procedure were thoroughly explained, informed consent was obtained.  Digital rectal exam was performed and revealed no rectal masses.   The LB [id number] endoscope was introduced through the anus and advanced to the cecum, which was identified by both the appendix and ileocecal valve, without limitations.  The quality of the prep was good, using MoviPrep. The instrument was then slowly withdrawn as the colon was fully examined. <<PROCEDUREIMAGES>>  FINDINGS:  Moderate diverticulosis was found ascending colon to sigmoid colon.  This was otherwise a normal examination of the colon.   Retroflexed views in the rectum revealed internal hemorrhoids.  The scope was then withdrawn  from the cecum and the procedure completed.  COMPLICATIONS:  None  ENDOSCOPIC IMPRESSION: 1) Moderate diverticulosis ascending colon to sigmoid colon 2) Otherwise normal examination 3) Internal hemorrhoids  RECOMMENDATIONS: 1) High fiber diet. 2) You should continue to follow colorectal cancer screening guidelines for "routine risk" patients with a repeat colonoscopy in 10 years. There is no need for FOBT (stool) testing for at least 5 years.  Carie Caddy. Rhea Belton, MD  CC:  The Patient [name], MD  n. Rosalie DoctorCarie Caddy. Heinrich Fertig at 07/11/2011 04:25 PM  Estil Daft, [phone]

## 2011-07-12 ENCOUNTER — Telehealth: Payer: Self-pay | Admitting: *Deleted

## 2011-07-12 NOTE — Telephone Encounter (Signed)
  Follow up Call-  Call back number 07/11/2011 11/12/2010  Post procedure Call Back phone  # [phone] HOME#  Permission to leave phone message Yes -     Patient questions:  Do you have a fever, pain , or abdominal swelling? no Pain Score  0 *  Have you tolerated food without any problems? yes  Have you been able to return to your normal activities? yes  Do you have any questions about your discharge instructions: Diet   no Medications  no Follow up visit  no  Do you have questions or concerns about your Care? no  Actions: * If pain score is 4 or above: No action needed, pain <4.

## 2011-07-17 ENCOUNTER — Ambulatory Visit (INDEPENDENT_AMBULATORY_CARE_PROVIDER_SITE_OTHER): Payer: BC Managed Care – PPO

## 2011-07-17 DIAGNOSIS — I359 Nonrheumatic aortic valve disorder, unspecified: Secondary | ICD-10-CM

## 2011-07-17 DIAGNOSIS — Z7901 Long term (current) use of anticoagulants: Secondary | ICD-10-CM

## 2011-07-17 DIAGNOSIS — I4891 Unspecified atrial fibrillation: Secondary | ICD-10-CM

## 2011-07-17 DIAGNOSIS — Z954 Presence of other heart-valve replacement: Secondary | ICD-10-CM

## 2011-07-23 ENCOUNTER — Other Ambulatory Visit: Payer: BC Managed Care – PPO

## 2011-07-23 ENCOUNTER — Ambulatory Visit (INDEPENDENT_AMBULATORY_CARE_PROVIDER_SITE_OTHER): Payer: BC Managed Care – PPO

## 2011-07-23 DIAGNOSIS — I4891 Unspecified atrial fibrillation: Secondary | ICD-10-CM

## 2011-07-23 DIAGNOSIS — I359 Nonrheumatic aortic valve disorder, unspecified: Secondary | ICD-10-CM

## 2011-07-23 DIAGNOSIS — Z954 Presence of other heart-valve replacement: Secondary | ICD-10-CM

## 2011-07-23 DIAGNOSIS — Z7901 Long term (current) use of anticoagulants: Secondary | ICD-10-CM

## 2011-07-23 LAB — HEMOCCULT SLIDES (X 3 CARDS)
OCCULT 3: NEGATIVE
OCCULT 4: NEGATIVE
OCCULT 5: NEGATIVE

## 2011-07-23 NOTE — Patient Instructions (Signed)
Take 4mg  today, then change to 3 mg every day except 2 mg Saturday, Sunday and Tuesday. Recheck in 1 week.

## 2011-07-25 NOTE — Progress Notes (Signed)
Quick Note:  Please inform the patient that hemeoccults were normal and to continue current plan of action ______ 

## 2011-07-31 ENCOUNTER — Ambulatory Visit (INDEPENDENT_AMBULATORY_CARE_PROVIDER_SITE_OTHER): Payer: BC Managed Care – PPO | Admitting: *Deleted

## 2011-07-31 DIAGNOSIS — I4891 Unspecified atrial fibrillation: Secondary | ICD-10-CM

## 2011-07-31 DIAGNOSIS — Z7901 Long term (current) use of anticoagulants: Secondary | ICD-10-CM

## 2011-07-31 DIAGNOSIS — Z954 Presence of other heart-valve replacement: Secondary | ICD-10-CM

## 2011-07-31 DIAGNOSIS — I359 Nonrheumatic aortic valve disorder, unspecified: Secondary | ICD-10-CM

## 2011-07-31 LAB — POCT INR: INR: 2.1

## 2011-08-05 ENCOUNTER — Other Ambulatory Visit: Payer: Self-pay | Admitting: *Deleted

## 2011-08-05 MED ORDER — POTASSIUM CHLORIDE CRYS ER 20 MEQ PO TBCR: 20.0000 meq | EXTENDED_RELEASE_TABLET | Freq: Every day | ORAL | Status: DC

## 2011-08-08 ENCOUNTER — Encounter (INDEPENDENT_AMBULATORY_CARE_PROVIDER_SITE_OTHER): Payer: BC Managed Care – PPO | Admitting: Surgery

## 2011-08-14 ENCOUNTER — Ambulatory Visit (INDEPENDENT_AMBULATORY_CARE_PROVIDER_SITE_OTHER): Payer: BC Managed Care – PPO

## 2011-08-14 DIAGNOSIS — I4891 Unspecified atrial fibrillation: Secondary | ICD-10-CM

## 2011-08-14 DIAGNOSIS — Z7901 Long term (current) use of anticoagulants: Secondary | ICD-10-CM

## 2011-08-14 DIAGNOSIS — Z954 Presence of other heart-valve replacement: Secondary | ICD-10-CM

## 2011-08-14 DIAGNOSIS — I359 Nonrheumatic aortic valve disorder, unspecified: Secondary | ICD-10-CM

## 2011-08-14 LAB — POCT INR: INR: 2.3

## 2011-08-27 ENCOUNTER — Ambulatory Visit (INDEPENDENT_AMBULATORY_CARE_PROVIDER_SITE_OTHER): Payer: Self-pay | Admitting: Surgery

## 2011-08-27 ENCOUNTER — Encounter (INDEPENDENT_AMBULATORY_CARE_PROVIDER_SITE_OTHER): Payer: Self-pay | Admitting: Surgery

## 2011-08-27 VITALS — BP 142/82 | HR 64 | Temp 97.6°F | Ht 70.0 in | Wt 246.2 lb

## 2011-08-27 DIAGNOSIS — Z8719 Personal history of other diseases of the digestive system: Secondary | ICD-10-CM | POA: Insufficient documentation

## 2011-08-27 NOTE — Patient Instructions (Signed)
Return as needed

## 2011-08-27 NOTE — Progress Notes (Signed)
Subjective:     Patient ID: [name] [name], male   DOB: [birth year], 61 y.o.   MRN: [id number]  HPI  Patient presents in followup today on intra-abdominal fluid collection and history of pancreatitis. He had a lower sheath abscess drained back in December of 2012. Etiology is unclear but there were some concern he had a perforation of the duodenal but this was never conclusively proven. He underwent drainage of this has resolved. He has a history of complex pancreatitis or pseudocyst. 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 He also an incisional repair by me last fall. He has multiple medical problems. He looks good.  Review of Systems  Constitutional: Positive for fatigue.  HENT: Negative.   Eyes: Negative.   Respiratory: Negative.   Cardiovascular: Negative.   Gastrointestinal: Negative.        Objective:   Physical Exam  Constitutional: He appears well-developed and well-nourished.  HENT:  Head: Normocephalic and atraumatic.  Eyes: EOM are normal. Pupils are equal, round, and reactive to light.  Abdominal:         Assessment:     History of pancreatitis History of intraabdominal abscess.    Plan:     He looks well. He's had no further problems from his intra-abdominal abscess in the last 6 months. He has no epigastric pain. He soon sutures all no longer following since he is asymptomatic. He will return as needed. He will call with questions.

## 2011-09-11 ENCOUNTER — Ambulatory Visit (INDEPENDENT_AMBULATORY_CARE_PROVIDER_SITE_OTHER): Payer: BC Managed Care – PPO

## 2011-09-11 DIAGNOSIS — Z7901 Long term (current) use of anticoagulants: Secondary | ICD-10-CM

## 2011-09-11 DIAGNOSIS — I4891 Unspecified atrial fibrillation: Secondary | ICD-10-CM

## 2011-09-11 DIAGNOSIS — I359 Nonrheumatic aortic valve disorder, unspecified: Secondary | ICD-10-CM

## 2011-09-11 DIAGNOSIS — Z954 Presence of other heart-valve replacement: Secondary | ICD-10-CM

## 2011-09-18 ENCOUNTER — Other Ambulatory Visit: Payer: Self-pay | Admitting: *Deleted

## 2011-09-18 MED ORDER — WARFARIN SODIUM 1 MG PO TABS: ORAL_TABLET | ORAL | Status: DC

## 2011-10-09 ENCOUNTER — Ambulatory Visit (INDEPENDENT_AMBULATORY_CARE_PROVIDER_SITE_OTHER): Payer: BC Managed Care – PPO

## 2011-10-09 DIAGNOSIS — I4891 Unspecified atrial fibrillation: Secondary | ICD-10-CM

## 2011-10-09 DIAGNOSIS — I359 Nonrheumatic aortic valve disorder, unspecified: Secondary | ICD-10-CM

## 2011-10-09 DIAGNOSIS — Z954 Presence of other heart-valve replacement: Secondary | ICD-10-CM

## 2011-10-09 DIAGNOSIS — Z7901 Long term (current) use of anticoagulants: Secondary | ICD-10-CM

## 2011-11-04 ENCOUNTER — Ambulatory Visit (INDEPENDENT_AMBULATORY_CARE_PROVIDER_SITE_OTHER): Payer: BC Managed Care – PPO | Admitting: Family Medicine

## 2011-11-04 ENCOUNTER — Encounter: Payer: Self-pay | Admitting: Family Medicine

## 2011-11-04 VITALS — BP 132/84 | HR 68 | Temp 98.2°F | Wt 217.0 lb

## 2011-11-04 DIAGNOSIS — R5383 Other fatigue: Secondary | ICD-10-CM

## 2011-11-04 DIAGNOSIS — E1165 Type 2 diabetes mellitus with hyperglycemia: Secondary | ICD-10-CM

## 2011-11-04 DIAGNOSIS — E119 Type 2 diabetes mellitus without complications: Secondary | ICD-10-CM

## 2011-11-04 NOTE — Patient Instructions (Addendum)
Drink plenty of water (no tea or soda).  No white foods- bread, pasta, rice, potatoes.  Okay to eat lean meat, veggies.  Avoid sweat fruits.  We'll be in touch tomorrow. If you get abdominal pain, feel faint, get short of breath, or have a fever then go to the ER.  Take care.

## 2011-11-04 NOTE — Progress Notes (Signed)
He had a URI and then had some weight loss, 240-->217.  Noted a dry mouth. Drinking more water, but still with dry mouth.  Going on for last 6 weeks.  Polyuria, urgency.  Feels weaker overall.  H/o pancreatitis prev.  No abd pain now.   Meds, vitals, and allergies reviewed.   ROS: See HPI.  Otherwise, noncontributory.  nad ncat Mmm rrr ctab abd soft Ext w/o edema

## 2011-11-05 ENCOUNTER — Other Ambulatory Visit: Payer: Self-pay | Admitting: *Deleted

## 2011-11-05 DIAGNOSIS — E119 Type 2 diabetes mellitus without complications: Secondary | ICD-10-CM | POA: Insufficient documentation

## 2011-11-05 LAB — COMPREHENSIVE METABOLIC PANEL
Albumin: 4.6 g/dL (ref 3.5–5.2)
Alkaline Phosphatase: 159 U/L — ABNORMAL HIGH (ref 39–117)
BUN: 13 mg/dL (ref 6–23)
Calcium: 9.8 mg/dL (ref 8.4–10.5)
Chloride: 95 mEq/L — ABNORMAL LOW (ref 96–112)
Creat: 1.09 mg/dL (ref 0.50–1.35)
Glucose, Bld: 426 mg/dL — ABNORMAL HIGH (ref 70–99)
Potassium: 4.9 mEq/L (ref 3.5–5.3)

## 2011-11-05 LAB — INSULIN, RANDOM: Insulin: 6 u[IU]/mL (ref 3–28)

## 2011-11-05 LAB — HEMOGLOBIN A1C: Mean Plasma Glucose: 369 mg/dL — ABNORMAL HIGH (ref <117)

## 2011-11-05 MED ORDER — ACCU-CHEK MULTICLIX LANCETS MISC: Status: DC

## 2011-11-05 MED ORDER — GLUCOSE BLOOD VI STRP: ORAL_STRIP | Status: DC

## 2011-11-05 MED ORDER — ACCU-CHEK AVIVA PLUS W/DEVICE KIT: 1.0000 | PACK | Freq: Once | Status: DC

## 2011-11-05 MED ORDER — INSULIN GLARGINE 100 UNIT/ML ~~LOC~~ SOLN: SUBCUTANEOUS | Status: DC

## 2011-11-05 MED ORDER — INSULIN PEN NEEDLE 30G X 8 MM MISC: Status: DC

## 2011-11-05 NOTE — Assessment & Plan Note (Signed)
New dx.  Path/phys d/w pt.  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  See notes on labs.  Not acutely ill and doesn't appear to need hospitalization.  Insulin level will influence decision for metformin vs insulin.  Continue fluids PO and list of foods to avoid given.  >25 min spent with face to face with patient, >50% counseling and/or coordinating care  All understood by patient/wife.

## 2011-11-06 ENCOUNTER — Ambulatory Visit (INDEPENDENT_AMBULATORY_CARE_PROVIDER_SITE_OTHER): Payer: BC Managed Care – PPO

## 2011-11-06 DIAGNOSIS — I359 Nonrheumatic aortic valve disorder, unspecified: Secondary | ICD-10-CM

## 2011-11-06 DIAGNOSIS — Z7901 Long term (current) use of anticoagulants: Secondary | ICD-10-CM

## 2011-11-06 DIAGNOSIS — Z954 Presence of other heart-valve replacement: Secondary | ICD-10-CM

## 2011-11-06 DIAGNOSIS — I4891 Unspecified atrial fibrillation: Secondary | ICD-10-CM

## 2011-11-06 LAB — POCT INR: INR: 1.3

## 2011-11-08 ENCOUNTER — Encounter: Payer: Self-pay | Admitting: Family Medicine

## 2011-11-08 ENCOUNTER — Ambulatory Visit (INDEPENDENT_AMBULATORY_CARE_PROVIDER_SITE_OTHER): Payer: BC Managed Care – PPO | Admitting: Family Medicine

## 2011-11-08 VITALS — BP 142/76 | HR 74 | Temp 97.8°F | Wt 218.0 lb

## 2011-11-08 DIAGNOSIS — E1165 Type 2 diabetes mellitus with hyperglycemia: Secondary | ICD-10-CM

## 2011-11-08 LAB — BASIC METABOLIC PANEL
Chloride: 102 mEq/L (ref 96–112)
Creatinine, Ser: 0.9 mg/dL (ref 0.4–1.5)
GFR: 92.77 mL/min (ref >60.00)

## 2011-11-08 MED ORDER — ZOLPIDEM TARTRATE 5 MG PO TABS: 5.0000 mg | ORAL_TABLET | Freq: Every evening | ORAL | Status: DC | PRN

## 2011-11-08 NOTE — Patient Instructions (Addendum)
Http://www.diabetes.org/ Look at type 2 diabetes and start reading.  See Shirlee Limerick about your referral before you leave today. Check on insurance coverage for the diabetes classes.   Keep going up by 1 unit of lantus a day until your sugar is <150.  If 101 to 149, then no change in lantus dose.  If <100, then cut back by 1 unit.

## 2011-11-10 ENCOUNTER — Encounter: Payer: Self-pay | Admitting: Family Medicine

## 2011-11-10 NOTE — Progress Notes (Signed)
Started on lantus, sugar improving, now 200-250 fasting in AM. Vision, fatigue, dry mouth, polyuria all much improved.  Feels much better.  D/w pt about DM2 diet and interventions at this point.   Meds, vitals, and allergies reviewed.   ROS: See HPI.  Otherwise, noncontributory.  nad ncat Mmm rrr ctab abd soft

## 2011-11-10 NOTE — Assessment & Plan Note (Signed)
See notes on BMET, recheck at OV in about 2 weeks.   Much improved clinically.  Refer for DM2 teaching.  He agrees.   D/w pt about diabetes.org.

## 2011-11-20 ENCOUNTER — Ambulatory Visit (INDEPENDENT_AMBULATORY_CARE_PROVIDER_SITE_OTHER): Payer: BC Managed Care – PPO

## 2011-11-20 DIAGNOSIS — I4891 Unspecified atrial fibrillation: Secondary | ICD-10-CM

## 2011-11-20 DIAGNOSIS — Z7901 Long term (current) use of anticoagulants: Secondary | ICD-10-CM

## 2011-11-20 DIAGNOSIS — I359 Nonrheumatic aortic valve disorder, unspecified: Secondary | ICD-10-CM

## 2011-11-20 DIAGNOSIS — Z954 Presence of other heart-valve replacement: Secondary | ICD-10-CM

## 2011-11-20 LAB — POCT INR: INR: 1.3

## 2011-11-20 MED ORDER — WARFARIN SODIUM 3 MG PO TABS: ORAL_TABLET | ORAL | Status: DC

## 2011-11-26 ENCOUNTER — Ambulatory Visit: Payer: Self-pay | Admitting: Family Medicine

## 2011-11-28 ENCOUNTER — Ambulatory Visit (INDEPENDENT_AMBULATORY_CARE_PROVIDER_SITE_OTHER): Payer: BC Managed Care – PPO | Admitting: Family Medicine

## 2011-11-28 ENCOUNTER — Encounter: Payer: Self-pay | Admitting: Family Medicine

## 2011-11-28 VITALS — BP 130/82 | HR 54 | Temp 97.9°F | Wt 222.5 lb

## 2011-11-28 DIAGNOSIS — Z23 Encounter for immunization: Secondary | ICD-10-CM

## 2011-11-28 DIAGNOSIS — E119 Type 2 diabetes mellitus without complications: Secondary | ICD-10-CM

## 2011-11-28 DIAGNOSIS — E1165 Type 2 diabetes mellitus with hyperglycemia: Secondary | ICD-10-CM

## 2011-11-28 MED ORDER — INSULIN GLARGINE 100 UNIT/ML ~~LOC~~ SOLN: SUBCUTANEOUS | Status: DC

## 2011-11-28 NOTE — Patient Instructions (Addendum)
Recheck A1c in 3 months and then come see me after that.   lantus 32 units in the morning.   Increase morning dose of insulin by 1 unit per day until morning sugar is <150.  If <100 in AM, then decrease by 1 unit. If 100-150, then no change.  Take care.   Glad to see you.

## 2011-11-29 ENCOUNTER — Ambulatory Visit: Payer: Self-pay | Admitting: Family Medicine

## 2011-11-29 NOTE — Assessment & Plan Note (Signed)
No sx of hyperglycemia now.  Increase morning dose of insulin by 1 unit per day until morning sugar is <150. If <100 in AM, then decrease by 1 unit. If 100-150, then no change.  Recheck A1c in about 3 months.  Will need to add on ACE eventually.  I want to settle his sugars first.  He agrees with plan.  He's doing well.

## 2011-11-29 NOTE — Progress Notes (Signed)
Diabetes:  Using medications without difficulties:yes Hypoglycemic episodes:no Hyperglycemic episodes: occ, but <200 fasting now Feet problems:no Blood Sugars averaging: 150-200 in AM now Tolerating insulin titration, up to 32 units/day now  Meds, vitals, and allergies reviewed.   ROS: See HPI.  Otherwise negative.    GEN: nad, alert and oriented HEENT: mucous membranes moist NECK: supple w/o LA CV: rrr. PULM: ctab, no inc wob ABD: soft, +bs EXT: no edema SKIN: no acute rash  Diabetic foot exam: Normal inspection No skin breakdown No calluses  Normal DP pulses Normal sensation to light touch and monofilament Nails normal

## 2011-12-04 ENCOUNTER — Ambulatory Visit (INDEPENDENT_AMBULATORY_CARE_PROVIDER_SITE_OTHER): Payer: BC Managed Care – PPO

## 2011-12-04 DIAGNOSIS — I359 Nonrheumatic aortic valve disorder, unspecified: Secondary | ICD-10-CM

## 2011-12-04 DIAGNOSIS — I4891 Unspecified atrial fibrillation: Secondary | ICD-10-CM

## 2011-12-04 DIAGNOSIS — Z954 Presence of other heart-valve replacement: Secondary | ICD-10-CM

## 2011-12-04 DIAGNOSIS — Z7901 Long term (current) use of anticoagulants: Secondary | ICD-10-CM

## 2011-12-16 ENCOUNTER — Other Ambulatory Visit: Payer: Self-pay

## 2011-12-16 MED ORDER — POTASSIUM CHLORIDE CRYS ER 20 MEQ PO TBCR: 20.0000 meq | EXTENDED_RELEASE_TABLET | Freq: Every day | ORAL | Status: DC

## 2011-12-16 NOTE — Telephone Encounter (Signed)
..   Requested Prescriptions   Signed Prescriptions Disp Refills  . potassium chloride SA (K-DUR,KLOR-CON) 20 MEQ tablet 30 tablet 3    Sig: Take 1 tablet (20 mEq total) by mouth daily.    Authorizing Provider: Rollene Rotunda    Ordering User: Christella Hartigan, Axtyn Woehler Judie Petit

## 2011-12-18 ENCOUNTER — Ambulatory Visit (INDEPENDENT_AMBULATORY_CARE_PROVIDER_SITE_OTHER): Payer: BC Managed Care – PPO

## 2011-12-18 DIAGNOSIS — Z7901 Long term (current) use of anticoagulants: Secondary | ICD-10-CM

## 2011-12-18 DIAGNOSIS — Z954 Presence of other heart-valve replacement: Secondary | ICD-10-CM

## 2011-12-18 DIAGNOSIS — I4891 Unspecified atrial fibrillation: Secondary | ICD-10-CM

## 2011-12-18 DIAGNOSIS — I359 Nonrheumatic aortic valve disorder, unspecified: Secondary | ICD-10-CM

## 2011-12-29 ENCOUNTER — Ambulatory Visit: Payer: Self-pay | Admitting: Family Medicine

## 2012-01-08 ENCOUNTER — Ambulatory Visit (INDEPENDENT_AMBULATORY_CARE_PROVIDER_SITE_OTHER): Payer: BC Managed Care – PPO

## 2012-01-08 DIAGNOSIS — Z7901 Long term (current) use of anticoagulants: Secondary | ICD-10-CM

## 2012-01-08 DIAGNOSIS — I359 Nonrheumatic aortic valve disorder, unspecified: Secondary | ICD-10-CM

## 2012-01-08 DIAGNOSIS — I4891 Unspecified atrial fibrillation: Secondary | ICD-10-CM

## 2012-01-08 DIAGNOSIS — Z954 Presence of other heart-valve replacement: Secondary | ICD-10-CM

## 2012-01-08 LAB — POCT INR: INR: 1.6

## 2012-01-21 ENCOUNTER — Other Ambulatory Visit: Payer: Self-pay

## 2012-01-21 MED ORDER — CARVEDILOL 6.25 MG PO TABS: 6.2500 mg | ORAL_TABLET | Freq: Two times a day (BID) | ORAL | Status: DC

## 2012-01-29 ENCOUNTER — Ambulatory Visit: Payer: Self-pay | Admitting: Family Medicine

## 2012-02-05 ENCOUNTER — Ambulatory Visit (INDEPENDENT_AMBULATORY_CARE_PROVIDER_SITE_OTHER): Payer: Medicare Other

## 2012-02-05 DIAGNOSIS — Z954 Presence of other heart-valve replacement: Secondary | ICD-10-CM

## 2012-02-05 DIAGNOSIS — I359 Nonrheumatic aortic valve disorder, unspecified: Secondary | ICD-10-CM

## 2012-02-05 DIAGNOSIS — Z7901 Long term (current) use of anticoagulants: Secondary | ICD-10-CM

## 2012-02-05 DIAGNOSIS — I4891 Unspecified atrial fibrillation: Secondary | ICD-10-CM

## 2012-02-05 LAB — POCT INR: INR: 2.4

## 2012-02-21 ENCOUNTER — Other Ambulatory Visit (INDEPENDENT_AMBULATORY_CARE_PROVIDER_SITE_OTHER): Payer: Medicare Other

## 2012-02-21 DIAGNOSIS — E119 Type 2 diabetes mellitus without complications: Secondary | ICD-10-CM

## 2012-02-28 ENCOUNTER — Encounter: Payer: Self-pay | Admitting: Family Medicine

## 2012-02-28 ENCOUNTER — Ambulatory Visit (INDEPENDENT_AMBULATORY_CARE_PROVIDER_SITE_OTHER): Payer: Medicare Other | Admitting: Family Medicine

## 2012-02-28 VITALS — BP 170/80 | HR 51 | Temp 97.8°F | Wt 242.0 lb

## 2012-02-28 DIAGNOSIS — E119 Type 2 diabetes mellitus without complications: Secondary | ICD-10-CM

## 2012-02-28 DIAGNOSIS — I10 Essential (primary) hypertension: Secondary | ICD-10-CM

## 2012-02-28 MED ORDER — LISINOPRIL 10 MG PO TABS: 10.0000 mg | ORAL_TABLET | Freq: Every day | ORAL | Status: DC

## 2012-02-28 NOTE — Assessment & Plan Note (Signed)
Much improved.  He may have a 'honeymoon' period with the insulin.  If his sugar is low enough to taper off, then he'll keep checking his sugar and restart if needed.  Add on ACE today (d/w pt about salt restrtiction).  He agrees.  Recheck in 3 months.  Recheck lipids at that point with A1c.

## 2012-02-28 NOTE — Patient Instructions (Addendum)
Start taking lisinopril 10mg  a day. Come back for nonfasting labs in about 2 weeks.   Check your BP in the meantime and drop off the recordings.  Cut out salt.   Recheck labs in 3 months- fasting.  Visit with Para March a few days after that.  Take care.  Keep moving your insulin up or down as needed.

## 2012-02-28 NOTE — Assessment & Plan Note (Signed)
Will add on ACE to see if cough returns.  If not, then continue.  If returns, then consider ARB.  Recheck BP at home and drop off the numbers when he comes for labs in ~2 weeks.  He agrees.

## 2012-02-28 NOTE — Progress Notes (Signed)
Diabetes:  Using medications without difficulties: yes Hypoglycemic episodes:no Hyperglycemic episodes:no Feet problems:no Blood Sugars averaging: ~120 eye exam within last year: yes He's down to 5 units of lantus.    It may be that the ACE prev didn't cause the cough.  Stopping the med didn't change the cough, but the resolution of pancreatitis did.  We discussed ACE use for HTN in DM2.   Hypertension:    Using medication without problems or lightheadedness: yes Chest pain with exertion:no Edema:no Short of breath:no Average home BPs: Has been salting his food.    Meds, vitals, and allergies reviewed.   ROS: See HPI.  Otherwise negative.    GEN: nad, alert and oriented HEENT: mucous membranes moist NECK: supple w/o LA CV: rrr. Click noted, midline scar healed PULM: ctab, no inc wob ABD: soft, +bs EXT: no edema SKIN: no acute rash  Diabetic foot exam: Normal inspection No skin breakdown No calluses  Normal DP pulses Normal sensation to light touch and monofilament Nails normal

## 2012-03-04 ENCOUNTER — Ambulatory Visit (INDEPENDENT_AMBULATORY_CARE_PROVIDER_SITE_OTHER): Payer: Medicare Other

## 2012-03-04 DIAGNOSIS — Z954 Presence of other heart-valve replacement: Secondary | ICD-10-CM

## 2012-03-04 DIAGNOSIS — I359 Nonrheumatic aortic valve disorder, unspecified: Secondary | ICD-10-CM

## 2012-03-04 DIAGNOSIS — Z7901 Long term (current) use of anticoagulants: Secondary | ICD-10-CM

## 2012-03-04 DIAGNOSIS — I4891 Unspecified atrial fibrillation: Secondary | ICD-10-CM

## 2012-03-04 LAB — POCT INR: INR: 2.9

## 2012-03-06 ENCOUNTER — Encounter: Payer: Self-pay | Admitting: Family Medicine

## 2012-03-13 ENCOUNTER — Other Ambulatory Visit: Payer: Medicare Other

## 2012-03-17 ENCOUNTER — Other Ambulatory Visit (INDEPENDENT_AMBULATORY_CARE_PROVIDER_SITE_OTHER): Payer: Medicare Other

## 2012-03-17 ENCOUNTER — Other Ambulatory Visit: Payer: Self-pay | Admitting: *Deleted

## 2012-03-17 DIAGNOSIS — E7800 Pure hypercholesterolemia, unspecified: Secondary | ICD-10-CM

## 2012-03-17 DIAGNOSIS — E119 Type 2 diabetes mellitus without complications: Secondary | ICD-10-CM

## 2012-03-17 LAB — BASIC METABOLIC PANEL
CO2: 26 mEq/L (ref 19–32)
Calcium: 9.6 mg/dL (ref 8.4–10.5)
Creatinine, Ser: 1.1 mg/dL (ref 0.4–1.5)
Glucose, Bld: 111 mg/dL — ABNORMAL HIGH (ref 70–99)

## 2012-03-17 NOTE — Addendum Note (Signed)
Addended by: Liane Comber C on: 03/17/2012 11:03 AM   Modules accepted: Orders

## 2012-04-01 ENCOUNTER — Ambulatory Visit (INDEPENDENT_AMBULATORY_CARE_PROVIDER_SITE_OTHER): Payer: Medicare Other

## 2012-04-01 DIAGNOSIS — Z7901 Long term (current) use of anticoagulants: Secondary | ICD-10-CM

## 2012-04-01 DIAGNOSIS — I4891 Unspecified atrial fibrillation: Secondary | ICD-10-CM

## 2012-04-01 DIAGNOSIS — Z954 Presence of other heart-valve replacement: Secondary | ICD-10-CM

## 2012-04-01 DIAGNOSIS — I359 Nonrheumatic aortic valve disorder, unspecified: Secondary | ICD-10-CM

## 2012-04-01 MED ORDER — WARFARIN SODIUM 3 MG PO TABS: ORAL_TABLET | ORAL | Status: DC

## 2012-04-06 ENCOUNTER — Other Ambulatory Visit: Payer: Self-pay

## 2012-04-06 MED ORDER — WARFARIN SODIUM 3 MG PO TABS: ORAL_TABLET | ORAL | Status: DC

## 2012-04-15 ENCOUNTER — Telehealth: Payer: Self-pay | Admitting: Family Medicine

## 2012-04-15 MED ORDER — LOSARTAN POTASSIUM 25 MG PO TABS: 25.0000 mg | ORAL_TABLET | Freq: Every day | ORAL | Status: DC

## 2012-04-15 NOTE — Telephone Encounter (Signed)
Noted, thanks.  Stop that and change to losartan.  Rx sent.  Thanks.  Have him check his BP at home and notify us if consistently >130/>90 on the new medicine.

## 2012-04-15 NOTE — Telephone Encounter (Signed)
Dr. Para March, Patient said you asked him to call if he is coughing a lot on the Lisinopril which is a new medication given to him.  He is coughing a lot and would like to try a different medication if possible. [phone]

## 2012-04-15 NOTE — Telephone Encounter (Signed)
Left message asking patient to call back

## 2012-04-16 ENCOUNTER — Other Ambulatory Visit: Payer: Self-pay

## 2012-04-16 MED ORDER — POTASSIUM CHLORIDE CRYS ER 20 MEQ PO TBCR: 20.0000 meq | EXTENDED_RELEASE_TABLET | Freq: Every day | ORAL | Status: DC

## 2012-04-16 NOTE — Telephone Encounter (Signed)
..   Requested Prescriptions   Signed Prescriptions Disp Refills  . potassium chloride SA (K-DUR,KLOR-CON) 20 MEQ tablet 30 tablet 1    Sig: Take 1 tablet (20 mEq total) by mouth daily.    Authorizing Provider: Rollene Rotunda    Ordering User: Christella Hartigan, Linzy Laury Judie Petit

## 2012-04-17 NOTE — Telephone Encounter (Signed)
Wife advised. 

## 2012-05-06 ENCOUNTER — Ambulatory Visit (INDEPENDENT_AMBULATORY_CARE_PROVIDER_SITE_OTHER): Payer: Medicare Other

## 2012-05-06 DIAGNOSIS — I4891 Unspecified atrial fibrillation: Secondary | ICD-10-CM

## 2012-05-06 DIAGNOSIS — I359 Nonrheumatic aortic valve disorder, unspecified: Secondary | ICD-10-CM

## 2012-05-06 DIAGNOSIS — Z7901 Long term (current) use of anticoagulants: Secondary | ICD-10-CM

## 2012-05-06 DIAGNOSIS — Z954 Presence of other heart-valve replacement: Secondary | ICD-10-CM

## 2012-06-03 ENCOUNTER — Ambulatory Visit (INDEPENDENT_AMBULATORY_CARE_PROVIDER_SITE_OTHER): Payer: Medicare Other

## 2012-06-03 DIAGNOSIS — Z7901 Long term (current) use of anticoagulants: Secondary | ICD-10-CM

## 2012-06-03 DIAGNOSIS — Z954 Presence of other heart-valve replacement: Secondary | ICD-10-CM

## 2012-06-03 DIAGNOSIS — I4891 Unspecified atrial fibrillation: Secondary | ICD-10-CM

## 2012-06-03 DIAGNOSIS — I359 Nonrheumatic aortic valve disorder, unspecified: Secondary | ICD-10-CM

## 2012-06-04 ENCOUNTER — Other Ambulatory Visit: Payer: Self-pay

## 2012-06-04 MED ORDER — WARFARIN SODIUM 1 MG PO TABS: ORAL_TABLET | ORAL | Status: DC

## 2012-06-17 ENCOUNTER — Other Ambulatory Visit: Payer: Self-pay | Admitting: *Deleted

## 2012-06-17 MED ORDER — POTASSIUM CHLORIDE CRYS ER 20 MEQ PO TBCR: 20.0000 meq | EXTENDED_RELEASE_TABLET | Freq: Every day | ORAL | Status: DC

## 2012-07-01 ENCOUNTER — Encounter (INDEPENDENT_AMBULATORY_CARE_PROVIDER_SITE_OTHER): Payer: Medicare Other

## 2012-07-01 DIAGNOSIS — Z7901 Long term (current) use of anticoagulants: Secondary | ICD-10-CM

## 2012-07-01 DIAGNOSIS — I4891 Unspecified atrial fibrillation: Secondary | ICD-10-CM

## 2012-07-01 DIAGNOSIS — I359 Nonrheumatic aortic valve disorder, unspecified: Secondary | ICD-10-CM

## 2012-07-01 DIAGNOSIS — Z954 Presence of other heart-valve replacement: Secondary | ICD-10-CM

## 2012-07-06 ENCOUNTER — Other Ambulatory Visit: Payer: Self-pay

## 2012-07-06 MED ORDER — FUROSEMIDE 20 MG PO TABS: 20.0000 mg | ORAL_TABLET | Freq: Every day | ORAL | Status: DC | PRN

## 2012-07-06 NOTE — Telephone Encounter (Signed)
..   Requested Prescriptions   Signed Prescriptions Disp Refills  . furosemide (LASIX) 20 MG tablet 30 tablet 3    Sig: Take 1 tablet (20 mg total) by mouth daily as needed. For feet swelling    Authorizing Provider: Rollene Rotunda    Ordering User: Christella Hartigan, Avari Nevares Judie Petit

## 2012-07-13 ENCOUNTER — Other Ambulatory Visit: Payer: Self-pay | Admitting: *Deleted

## 2012-07-13 MED ORDER — FLUTICASONE PROPIONATE 50 MCG/ACT NA SUSP: 2.0000 | Freq: Every day | NASAL | Status: DC | PRN

## 2012-07-15 ENCOUNTER — Ambulatory Visit (INDEPENDENT_AMBULATORY_CARE_PROVIDER_SITE_OTHER): Payer: Medicare Other

## 2012-07-15 DIAGNOSIS — Z954 Presence of other heart-valve replacement: Secondary | ICD-10-CM

## 2012-07-15 DIAGNOSIS — Z7901 Long term (current) use of anticoagulants: Secondary | ICD-10-CM

## 2012-07-15 DIAGNOSIS — I4891 Unspecified atrial fibrillation: Secondary | ICD-10-CM

## 2012-07-15 DIAGNOSIS — I359 Nonrheumatic aortic valve disorder, unspecified: Secondary | ICD-10-CM

## 2012-07-16 ENCOUNTER — Other Ambulatory Visit: Payer: Self-pay

## 2012-07-16 MED ORDER — POTASSIUM CHLORIDE CRYS ER 20 MEQ PO TBCR: 20.0000 meq | EXTENDED_RELEASE_TABLET | Freq: Every day | ORAL | Status: DC

## 2012-07-16 NOTE — Telephone Encounter (Signed)
..   Requested Prescriptions   Signed Prescriptions Disp Refills  . potassium chloride SA (K-DUR,KLOR-CON) 20 MEQ tablet 30 tablet 0    Sig: Take 1 tablet (20 mEq total) by mouth daily.    Authorizing Provider: Rollene Rotunda    Ordering User: Christella Hartigan, [name] Judie Petit

## 2012-08-06 ENCOUNTER — Other Ambulatory Visit: Payer: Self-pay

## 2012-08-11 ENCOUNTER — Other Ambulatory Visit: Payer: Self-pay | Admitting: *Deleted

## 2012-08-11 MED ORDER — WARFARIN SODIUM 3 MG PO TABS: ORAL_TABLET | ORAL | Status: DC

## 2012-08-13 ENCOUNTER — Ambulatory Visit: Payer: Medicare Other | Admitting: Cardiology

## 2012-08-18 ENCOUNTER — Other Ambulatory Visit: Payer: Self-pay

## 2012-08-18 MED ORDER — POTASSIUM CHLORIDE CRYS ER 20 MEQ PO TBCR: 20.0000 meq | EXTENDED_RELEASE_TABLET | Freq: Every day | ORAL | Status: DC

## 2012-08-19 ENCOUNTER — Other Ambulatory Visit: Payer: Self-pay | Admitting: Family Medicine

## 2012-08-19 NOTE — Telephone Encounter (Signed)
Sent!

## 2012-08-19 NOTE — Telephone Encounter (Signed)
Received refill request electronically from pharmacy. Last office visit 02/10/12. Is it okay to refill medication?

## 2012-08-26 ENCOUNTER — Encounter: Payer: Self-pay | Admitting: Cardiology

## 2012-08-26 ENCOUNTER — Ambulatory Visit (INDEPENDENT_AMBULATORY_CARE_PROVIDER_SITE_OTHER): Payer: Medicare Other | Admitting: *Deleted

## 2012-08-26 DIAGNOSIS — Z954 Presence of other heart-valve replacement: Secondary | ICD-10-CM

## 2012-08-26 DIAGNOSIS — I359 Nonrheumatic aortic valve disorder, unspecified: Secondary | ICD-10-CM

## 2012-08-26 DIAGNOSIS — I4891 Unspecified atrial fibrillation: Secondary | ICD-10-CM

## 2012-08-26 DIAGNOSIS — Z7901 Long term (current) use of anticoagulants: Secondary | ICD-10-CM

## 2012-08-26 LAB — POCT INR: INR: 2.8

## 2012-08-28 ENCOUNTER — Other Ambulatory Visit: Payer: Self-pay | Admitting: *Deleted

## 2012-08-31 MED ORDER — WARFARIN SODIUM 3 MG PO TABS: ORAL_TABLET | ORAL | Status: DC

## 2012-09-02 ENCOUNTER — Other Ambulatory Visit: Payer: Self-pay

## 2012-09-03 ENCOUNTER — Other Ambulatory Visit (INDEPENDENT_AMBULATORY_CARE_PROVIDER_SITE_OTHER): Payer: Medicare Other

## 2012-09-03 DIAGNOSIS — E7800 Pure hypercholesterolemia, unspecified: Secondary | ICD-10-CM

## 2012-09-03 DIAGNOSIS — E119 Type 2 diabetes mellitus without complications: Secondary | ICD-10-CM

## 2012-09-03 LAB — HEMOGLOBIN A1C: Hgb A1c MFr Bld: 7.7 % — ABNORMAL HIGH (ref 4.6–6.5)

## 2012-09-03 LAB — LIPID PANEL
Cholesterol: 175 mg/dL (ref 0–200)
HDL: 38.7 mg/dL — ABNORMAL LOW (ref >39.00)
Triglycerides: 96 mg/dL (ref 0.0–149.0)
VLDL: 19.2 mg/dL (ref 0.0–40.0)

## 2012-09-10 ENCOUNTER — Ambulatory Visit: Payer: Medicare Other | Admitting: Family Medicine

## 2012-09-10 ENCOUNTER — Encounter: Payer: Self-pay | Admitting: Family Medicine

## 2012-09-10 ENCOUNTER — Ambulatory Visit (INDEPENDENT_AMBULATORY_CARE_PROVIDER_SITE_OTHER): Payer: Medicare Other | Admitting: Family Medicine

## 2012-09-10 VITALS — BP 156/88 | HR 62 | Temp 97.7°F | Wt 254.5 lb

## 2012-09-10 DIAGNOSIS — Z7901 Long term (current) use of anticoagulants: Secondary | ICD-10-CM

## 2012-09-10 DIAGNOSIS — I10 Essential (primary) hypertension: Secondary | ICD-10-CM

## 2012-09-10 DIAGNOSIS — E119 Type 2 diabetes mellitus without complications: Secondary | ICD-10-CM

## 2012-09-10 DIAGNOSIS — E7800 Pure hypercholesterolemia, unspecified: Secondary | ICD-10-CM

## 2012-09-10 MED ORDER — LOSARTAN POTASSIUM 50 MG PO TABS: 50.0000 mg | ORAL_TABLET | Freq: Every day | ORAL | Status: DC

## 2012-09-10 NOTE — Patient Instructions (Addendum)
Schedule your next coumadin check and lab draw here.  You don't need to fast.  Recheck at a physical in about 6 months.  Labs ahead of time.  I would get a flu shot each fall.   Increase the losartan to 50mg  a day.  Let me know if you BP stays elevated.  Take care.

## 2012-09-10 NOTE — Progress Notes (Signed)
Diabetes:  Using medications without difficulties:yes Hypoglycemic episodes:no Hyperglycemic episodes:no Feet problems:no Blood Sugars averaging: 161 this AM.   eye exam within last year:yes Diet and exercise discussed. "I need to work on it."  A1c discussed.   Elevated Cholesterol: Using medications without problems:yes Muscle aches:no Diet compliance:discussed Exercise:discussed  Hypertension:    Using medication without problems or lightheadedness: yes, no cough now on ARB Chest pain with exertion:no Edema:no Short of breath:no Average home BPs: similar to prev.   He's more active than prev, in the garden.   PMH and SH reviewed  Meds, vitals, and allergies reviewed.   ROS: See HPI.  Otherwise negative.    GEN: nad, alert and oriented HEENT: mucous membranes moist NECK: supple w/o LA CV: rrr. Audible click noted w/o stethoscope, at baseline for patient PULM: ctab, no inc wob ABD: soft, +bs EXT: no edema SKIN: no acute rash  Diabetic foot exam: Normal inspection No skin breakdown No calluses  Normal DP pulses Normal sensation to light touch and monofilament Nails normal

## 2012-09-11 NOTE — Assessment & Plan Note (Signed)
No change in meds, he'll work on diet and weight.  Recheck as scheduled.

## 2012-09-11 NOTE — Assessment & Plan Note (Signed)
He'll work on [employer], discussed labs.  Continue ARB but inc to 50mg  losartan. Recheck BMET as scheduled.  Discussed.

## 2012-09-11 NOTE — Assessment & Plan Note (Signed)
He'll work on diet, discussed labs. Recheck periodically.

## 2012-09-15 ENCOUNTER — Other Ambulatory Visit: Payer: Self-pay

## 2012-09-15 MED ORDER — POTASSIUM CHLORIDE CRYS ER 20 MEQ PO TBCR: 20.0000 meq | EXTENDED_RELEASE_TABLET | Freq: Every day | ORAL | Status: DC

## 2012-09-29 ENCOUNTER — Ambulatory Visit (INDEPENDENT_AMBULATORY_CARE_PROVIDER_SITE_OTHER): Payer: Medicare Other | Admitting: Cardiology

## 2012-09-29 ENCOUNTER — Other Ambulatory Visit: Payer: Self-pay

## 2012-09-29 ENCOUNTER — Encounter: Payer: Self-pay | Admitting: Cardiology

## 2012-09-29 VITALS — BP 126/66 | HR 62 | Ht 71.0 in | Wt 251.0 lb

## 2012-09-29 DIAGNOSIS — I4891 Unspecified atrial fibrillation: Secondary | ICD-10-CM

## 2012-09-29 DIAGNOSIS — I359 Nonrheumatic aortic valve disorder, unspecified: Secondary | ICD-10-CM

## 2012-09-29 MED ORDER — AMOXICILLIN 250 MG PO CAPS: 1000.0000 mg | ORAL_CAPSULE | Freq: Once | ORAL | Status: DC

## 2012-09-29 MED ORDER — WARFARIN SODIUM 1 MG PO TABS: ORAL_TABLET | ORAL | Status: DC

## 2012-09-29 MED ORDER — POTASSIUM CHLORIDE CRYS ER 20 MEQ PO TBCR: 20.0000 meq | EXTENDED_RELEASE_TABLET | Freq: Every day | ORAL | Status: DC

## 2012-09-29 NOTE — Patient Instructions (Addendum)
The current medical regimen is effective;  continue present plan and medications.  Your physician has requested that you have an echocardiogram. Echocardiography is a painless test that uses sound waves to create images of your heart. It provides your doctor with information about the size and shape of your heart and how well your heart's chambers and valves are working. This procedure takes approximately one hour. There are no restrictions for this procedure.  Follow up in 18 months with Dr Hochrein.  You will receive a letter in the mail 2 months before you are due.  Please call us when you receive this letter to schedule your follow up appointment.  

## 2012-09-29 NOTE — Progress Notes (Signed)
HPI The patient presents for followup after aortic valve replacement. Since I last saw him he has been doing much better. After treatment for pancreatitis he is symptoms actually have resolved and his appetite is up. In fact he's gained about 70 pounds back. He is doing some yard work and some activities.  He has no chest pain.  He denies SOB, PND or orthopnea.    Allergies  Allergen Reactions  . Ace Inhibitors     REACTION: cough  . Primaxin [Imipenem W/Cilastatin Sodium]     rash    Current Outpatient Prescriptions  Medication Sig Dispense Refill  . amoxicillin (AMOXIL) 250 MG capsule Take 4 capsules by mouth one hour prior to dental work.      . Blood Glucose Monitoring Suppl (ACCU-CHEK AVIVA PLUS) W/DEVICE KIT 1 kit by Does not apply route once.  1 kit  0  . carvedilol (COREG) 6.25 MG tablet Take 1 tablet (6.25 mg total) by mouth 2 (two) times daily.  60 tablet  11  . CREON 6000 UNITS CPEP Take 1 capsule by mouth 3 (three) times daily with meals.       . diphenhydrAMINE (BENADRYL) 25 MG tablet Take 25 mg by mouth every 6 (six) hours as needed.        . docusate sodium (COLACE) 100 MG capsule Take 100 mg by mouth every other day.       . fluticasone (FLONASE) 50 MCG/ACT nasal spray USE 2 SPRAYS IN EACH NOSTRIL ONCE A DAY AS NEEDED FOR CONGESTION  16 g  5  . furosemide (LASIX) 20 MG tablet Take 1 tablet (20 mg total) by mouth daily as needed. For feet swelling  30 tablet  3  . glucose blood (ACCU-CHEK AVIVA) test strip Use as instructed  100 each  12  . insulin glargine (LANTUS) 100 UNIT/ML injection 16 units in the morning.   Increase morning dose of insulin by 1 unit per day until morning sugar is <150.  If <100 in AM, then decrease by 1 unit.  Diagnosis 250.00      . Insulin Pen Needle (NOVOFINE) 30G X 8 MM MISC For use to inject insulin subcutaneously into the skin as directed.  Diagnosis:  250.00  100 each  11  . Lancets (ACCU-CHEK MULTICLIX) lancets Use as instructed  100 each  12   . losartan (COZAAR) 50 MG tablet Take 1 tablet (50 mg total) by mouth daily.  90 tablet  3  . potassium chloride SA (K-DUR,KLOR-CON) 20 MEQ tablet Take 1 tablet (20 mEq total) by mouth daily.  30 tablet  0  . warfarin (COUMADIN) 1 MG tablet Take as directed by coumadin clinic.  30 tablet  3  . warfarin (COUMADIN) 3 MG tablet Take as directed by anticoagulation clinic  30 tablet  3   No current facility-administered medications for this visit.    Past Medical History  Diagnosis Date  . Aortic insufficiency     with bicuspid aortic valve (the last MRI demonstrated the aortic root to be 4.8 x 4.7 cm.) Mod regurgitation. Normal chamber size with very mild left ventricular dysfunction.)  . Dyslipidemia   . Duodenal ulcer     with GI bleeding  . Hypertension   . Pericardial effusion 07/2009    admission to [facility], s/p pericardial window  . PNA (pneumonia) 10/2009    admnission to [facility], with right pleural effusion, s/p thoracentesis  . 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 Syncope 08/2009    admission for fever and syncope, no source for fever seen on cultures. Syncope thought to be r/t orthostasis  . Pleural effusion 10/2009    s/p thoracentesis  . Cough 06/28/2009    Dr. Sherene Sires  . GERD (gastroesophageal reflux disease) 03/23/2001    egd w/ dilation...Marland KitchenMarland KitchenArlyce Dice  . Acute pancreatitis     with pancreatic necrosis  . CHF (congestive heart failure)   . Orthostasis 08/2009    Fever and syncope, no source for fever on cultures.  [name] Heart murmur   . Shortness of breath     "w/exerction"  . Pneumonia 2011    "twice"  . Anemia   . Blood transfusion   . Hemorrhoids   . GI bleed     Gastritis, ? Chem, Working for [employer]  . Diabetes mellitus without complication     Past Surgical History  Procedure Laterality Date  . Esophagogastroduodenoscopy  1990, 1999    Hot dog in throat (1987)/ Chicken in throat (1990)/ 01/06/97 (Dr. Kinnie Scales) Impaction,  dilatation, Schatzki's Ring/ 07/1998 repeat Dilatation/ 01/10/01 stricture/dilated/ 03/08/05 Schatzki's Ring, dilated/ 06/01/08 Food Disimpaction [name] Bulb Ulcer (Dr. Ewing Schlein)  . Esophagogastroduodenoscopy  01/06/1997    (Dr. Kinnie Scales) Impaction, dilatation, Schatzki's Ring  . Valve replacement  06/2009    [employer] Jude mechanical valve per Dr. Laneta Simmers  . Doppler echocardiography  01/18/97    LVH, mild dilated aorta - root mod to severe aortic regurg EF 50-55%  . Esophagogastroduodenoscopy  08/09/98    Repeat dilatation [name])  . Esophagogastroduodenoscopy  01/13/2001    stricture/ HH, duodenal ulcer / bleeding Marina Goodell)  . Esophagogastroduodenoscopy  12/25/01    esophagus stricture, dilated  . Esophagogastroduodenoscopy  03/23/2001    stricture, dilated  . Cardiolite ekg  03/10/2003    (-) ? small/apical infarct EF 50%  . Mri  2/05    LVH global hypokin EF 48% Mod A.I.  . Doppler echocardiography  09/27/03    No changes, moderate severe aortic regurg  . Doppler echocardiography  07/09/2004    Mod severe aortic regurg 2-3+ T.R., T.I.R., mild P..R.  . Esophagogastroduodenoscopy  03/08/05    Schatzki's Ring   03/13/05  Schatzki's Ring - dilated, duodenitis  . Doppler echocardiography  04/01/06    Hypokin Apex EF 55%  . Esophagogastroduodenoscopy  06/11/08    with food disimpaction.  Food impaction Ms HH  Sm bulb ulcer (Dr. Ewing Schlein)  . Doppler echocardiography  02/15/2010    Mild LVH, mild decr Sys fctn EF 40-45% Mild Dias Dysfctn Mech AV Triv AR, MR  . Esophagogastroduodenoscopy      stricture/HH, duodenal ulcer/bleeding Marina Goodell)  . Cardiac valve replacement  07/10/2009    AVR  . Hernia repair  2011    abdominal; post AVR    ROS:  As stated in the HPI and negative for all other systems.  PHYSICAL EXAM BP 126/66  Pulse 62  Ht 5\' 11"  (1.803 m)  Wt 251 lb (113.853 kg)  BMI 35.02 kg/m2 GENERAL:  Well appearing HEENT:  Pupils equal round and reactive, fundi not visualized, oral mucosa  unremarkable NECK:  No jugular venous distention, waveform within normal limits, carotid upstroke brisk and symmetric, no bruits, no thyromegaly LYMPHATICS:  No cervical, inguinal adenopathy LUNGS:  Clear to auscultation bilaterally, right basilar crackles BACK:  No CVA tenderness CHEST:  Well healed sternotomy scar, percutaneous drain in place. HEART:  PMI not displaced or sustained,S1 within normal limits, mechanical  S2 no S3, no S4, no clicks, no rubs, no murmurs ABD:  Flat, positive bowel sounds normal in frequency in pitch, no bruits, no rebound, no guarding, no midline pulsatile mass, no hepatomegaly, no splenomegaly, obese EXT:  2 plus pulses throughout, trace edema, no cyanosis no clubbing SKIN:  No rashes no nodules NEURO:  Cranial nerves II through XII grossly intact, motor grossly intact throughout PSYCH:  Cognitively intact, oriented to person place and time  EKG:  Sinus rhythm, rate 60 to, left axis deviation, lateral infarct, lateral T-wave inversions.  Lateral Q waves in T-wave inversion are new since previous.. 09/29/2012  ASSESSMENT AND PLAN  AVR:  I will repeat an echocardiogram. He understands endocarditis prophylaxis. He tolerates his anticoagulation.  CARDIOMYOPATHY:  I will assess this with the echocardiogram as above.  OBESITY: The patient understands the need to lose weight with diet and exercise. We have discussed specific strategies for this.  ABNORMAL EKG:  His T-wave inversions in Q waves are new compared with previous. However, he had no coronary disease in 2011 on cath. He has had no symptoms. If his ejection fraction is unchanged I will not plan further evaluation of this.

## 2012-09-30 ENCOUNTER — Other Ambulatory Visit: Payer: Self-pay | Admitting: *Deleted

## 2012-09-30 MED ORDER — WARFARIN SODIUM 1 MG PO TABS: ORAL_TABLET | ORAL | Status: DC

## 2012-10-07 ENCOUNTER — Ambulatory Visit (HOSPITAL_COMMUNITY): Payer: Medicare Other | Attending: Cardiology | Admitting: Radiology

## 2012-10-07 DIAGNOSIS — Z954 Presence of other heart-valve replacement: Secondary | ICD-10-CM | POA: Insufficient documentation

## 2012-10-07 DIAGNOSIS — I10 Essential (primary) hypertension: Secondary | ICD-10-CM | POA: Insufficient documentation

## 2012-10-07 DIAGNOSIS — R002 Palpitations: Secondary | ICD-10-CM | POA: Insufficient documentation

## 2012-10-07 DIAGNOSIS — E119 Type 2 diabetes mellitus without complications: Secondary | ICD-10-CM | POA: Insufficient documentation

## 2012-10-07 DIAGNOSIS — R5381 Other malaise: Secondary | ICD-10-CM | POA: Insufficient documentation

## 2012-10-07 DIAGNOSIS — R0989 Other specified symptoms and signs involving the circulatory and respiratory systems: Secondary | ICD-10-CM | POA: Insufficient documentation

## 2012-10-07 DIAGNOSIS — I4891 Unspecified atrial fibrillation: Secondary | ICD-10-CM | POA: Insufficient documentation

## 2012-10-07 DIAGNOSIS — E669 Obesity, unspecified: Secondary | ICD-10-CM | POA: Insufficient documentation

## 2012-10-07 DIAGNOSIS — G4733 Obstructive sleep apnea (adult) (pediatric): Secondary | ICD-10-CM | POA: Insufficient documentation

## 2012-10-07 DIAGNOSIS — I359 Nonrheumatic aortic valve disorder, unspecified: Secondary | ICD-10-CM | POA: Insufficient documentation

## 2012-10-07 DIAGNOSIS — E7800 Pure hypercholesterolemia, unspecified: Secondary | ICD-10-CM | POA: Insufficient documentation

## 2012-10-07 DIAGNOSIS — R0609 Other forms of dyspnea: Secondary | ICD-10-CM | POA: Insufficient documentation

## 2012-10-07 NOTE — Progress Notes (Signed)
Echocardiogram performed.  

## 2012-10-08 ENCOUNTER — Ambulatory Visit (INDEPENDENT_AMBULATORY_CARE_PROVIDER_SITE_OTHER): Payer: Medicare Other | Admitting: Family Medicine

## 2012-10-08 ENCOUNTER — Other Ambulatory Visit (INDEPENDENT_AMBULATORY_CARE_PROVIDER_SITE_OTHER): Payer: Medicare Other

## 2012-10-08 DIAGNOSIS — Z7901 Long term (current) use of anticoagulants: Secondary | ICD-10-CM

## 2012-10-08 DIAGNOSIS — E119 Type 2 diabetes mellitus without complications: Secondary | ICD-10-CM

## 2012-10-08 DIAGNOSIS — I4891 Unspecified atrial fibrillation: Secondary | ICD-10-CM

## 2012-10-08 DIAGNOSIS — Z954 Presence of other heart-valve replacement: Secondary | ICD-10-CM

## 2012-10-08 DIAGNOSIS — I359 Nonrheumatic aortic valve disorder, unspecified: Secondary | ICD-10-CM

## 2012-10-08 LAB — CBC WITH DIFFERENTIAL/PLATELET
Basophils Absolute: 0 10*3/uL (ref 0.0–0.1)
Basophils Relative: 0.6 % (ref 0.0–3.0)
Eosinophils Absolute: 0.2 10*3/uL (ref 0.0–0.7)
Lymphocytes Relative: 37 % (ref 12.0–46.0)
MCHC: 34.3 g/dL (ref 30.0–36.0)
MCV: 92.4 fl (ref 78.0–100.0)
Monocytes Absolute: 0.8 10*3/uL (ref 0.1–1.0)
Neutrophils Relative %: 47.6 % (ref 43.0–77.0)
Platelets: 156 10*3/uL (ref 150.0–400.0)
RBC: 4.97 Mil/uL (ref 4.22–5.81)

## 2012-10-08 LAB — BASIC METABOLIC PANEL
BUN: 13 mg/dL (ref 6–23)
Chloride: 105 mEq/L (ref 96–112)
Creatinine, Ser: 0.9 mg/dL (ref 0.4–1.5)
GFR: 90.14 mL/min (ref >60.00)
Potassium: 4.3 mEq/L (ref 3.5–5.1)

## 2012-10-09 ENCOUNTER — Encounter: Payer: Self-pay | Admitting: *Deleted

## 2012-11-19 ENCOUNTER — Ambulatory Visit: Payer: Medicare Other

## 2012-11-23 ENCOUNTER — Ambulatory Visit (INDEPENDENT_AMBULATORY_CARE_PROVIDER_SITE_OTHER): Payer: Medicare Other | Admitting: Family Medicine

## 2012-11-23 ENCOUNTER — Ambulatory Visit: Payer: Medicare Other

## 2012-11-23 DIAGNOSIS — I4891 Unspecified atrial fibrillation: Secondary | ICD-10-CM

## 2012-11-23 DIAGNOSIS — Z23 Encounter for immunization: Secondary | ICD-10-CM

## 2012-11-23 DIAGNOSIS — I359 Nonrheumatic aortic valve disorder, unspecified: Secondary | ICD-10-CM

## 2012-11-23 DIAGNOSIS — Z954 Presence of other heart-valve replacement: Secondary | ICD-10-CM

## 2012-11-23 DIAGNOSIS — Z7901 Long term (current) use of anticoagulants: Secondary | ICD-10-CM

## 2012-11-23 LAB — POCT INR: INR: 2.6

## 2012-11-30 ENCOUNTER — Other Ambulatory Visit: Payer: Self-pay | Admitting: Family Medicine

## 2012-12-09 ENCOUNTER — Encounter: Payer: Self-pay | Admitting: Family Medicine

## 2012-12-09 ENCOUNTER — Ambulatory Visit (INDEPENDENT_AMBULATORY_CARE_PROVIDER_SITE_OTHER): Payer: Medicare Other | Admitting: Family Medicine

## 2012-12-09 VITALS — BP 140/84 | HR 64 | Temp 97.7°F | Ht 71.0 in | Wt 254.5 lb

## 2012-12-09 DIAGNOSIS — J069 Acute upper respiratory infection, unspecified: Secondary | ICD-10-CM

## 2012-12-09 MED ORDER — BENZONATATE 200 MG PO CAPS: 200.0000 mg | ORAL_CAPSULE | Freq: Three times a day (TID) | ORAL | Status: DC | PRN

## 2012-12-09 NOTE — Progress Notes (Signed)
Pre-visit discussion using our clinic review tool. No additional management support is needed unless otherwise documented below in the visit note.  

## 2012-12-09 NOTE — Progress Notes (Signed)
Known sick contact.  Mult sick contacts in the community.  Sx started about 5 days ago.  Started with sneezing, then ST.  More cough in the meantime.  Cough is worse at night.  Some wheeze at night.  Some sputum.  No fevers documented but felt hot, occ sweats.  Some HA. No ear pain. No rhinorrhea.  Intermittently stuffy. No rash, vomiting, diarrhea.  Taking baseline meds.  Fatigued.  Cough is most bothersome.   Meds, vitals, and allergies reviewed.   ROS: See HPI.  Otherwise, noncontributory.  GEN: nad, alert and oriented HEENT: mucous membranes moist, tm w/o erythema, nasal exam w/o erythema, scant clear discharge noted,  OP with mild cobblestoning NECK: supple w/o LA CV: rrr with click noted from valve replacement.  PULM: ctab, no inc wob EXT: no edema SKIN: no acute rash

## 2012-12-09 NOTE — Patient Instructions (Signed)
You can try taking delsym in the AM and the tessalon later in the day and at night.  See if that helps.  If not improving gradually, then let me know.   Take care. Drink plenty of fluids and try to get some rest.  Glad to see you.

## 2012-12-10 DIAGNOSIS — J069 Acute upper respiratory infection, unspecified: Secondary | ICD-10-CM | POA: Insufficient documentation

## 2012-12-10 NOTE — Assessment & Plan Note (Signed)
Looks to be viral. Nontoxic and lungs completely ctab.  Nontoxic. Would use delsym and tessalon for the cough and that should help. Call back prn.  He agrees.

## 2012-12-25 ENCOUNTER — Other Ambulatory Visit: Payer: Self-pay | Admitting: Family Medicine

## 2013-01-01 ENCOUNTER — Other Ambulatory Visit: Payer: Self-pay | Admitting: Family Medicine

## 2013-01-07 ENCOUNTER — Ambulatory Visit (INDEPENDENT_AMBULATORY_CARE_PROVIDER_SITE_OTHER): Payer: Medicare Other | Admitting: Family Medicine

## 2013-01-07 DIAGNOSIS — Z7901 Long term (current) use of anticoagulants: Secondary | ICD-10-CM

## 2013-01-07 DIAGNOSIS — Z954 Presence of other heart-valve replacement: Secondary | ICD-10-CM

## 2013-01-07 DIAGNOSIS — I359 Nonrheumatic aortic valve disorder, unspecified: Secondary | ICD-10-CM

## 2013-01-07 DIAGNOSIS — I4891 Unspecified atrial fibrillation: Secondary | ICD-10-CM

## 2013-01-07 LAB — POCT INR: INR: 3.8

## 2013-01-19 ENCOUNTER — Other Ambulatory Visit: Payer: Self-pay | Admitting: *Deleted

## 2013-01-19 MED ORDER — WARFARIN SODIUM 1 MG PO TABS: ORAL_TABLET | ORAL | Status: DC

## 2013-02-08 ENCOUNTER — Ambulatory Visit: Payer: Medicare Other

## 2013-02-11 ENCOUNTER — Ambulatory Visit (INDEPENDENT_AMBULATORY_CARE_PROVIDER_SITE_OTHER): Payer: Medicare Other | Admitting: Family Medicine

## 2013-02-11 DIAGNOSIS — I359 Nonrheumatic aortic valve disorder, unspecified: Secondary | ICD-10-CM

## 2013-02-11 DIAGNOSIS — Z954 Presence of other heart-valve replacement: Secondary | ICD-10-CM

## 2013-02-11 DIAGNOSIS — I4891 Unspecified atrial fibrillation: Secondary | ICD-10-CM

## 2013-02-11 DIAGNOSIS — Z7901 Long term (current) use of anticoagulants: Secondary | ICD-10-CM

## 2013-02-11 LAB — POCT INR: INR: 2.1

## 2013-02-19 ENCOUNTER — Other Ambulatory Visit: Payer: Self-pay

## 2013-02-19 MED ORDER — CARVEDILOL 6.25 MG PO TABS: 6.2500 mg | ORAL_TABLET | Freq: Two times a day (BID) | ORAL | Status: DC

## 2013-03-13 ENCOUNTER — Other Ambulatory Visit: Payer: Self-pay | Admitting: Family Medicine

## 2013-03-15 ENCOUNTER — Other Ambulatory Visit: Payer: Self-pay | Admitting: Family Medicine

## 2013-03-15 ENCOUNTER — Other Ambulatory Visit (INDEPENDENT_AMBULATORY_CARE_PROVIDER_SITE_OTHER): Payer: Medicare Other

## 2013-03-15 ENCOUNTER — Other Ambulatory Visit: Payer: Medicare Other

## 2013-03-15 DIAGNOSIS — E119 Type 2 diabetes mellitus without complications: Secondary | ICD-10-CM

## 2013-03-15 DIAGNOSIS — E669 Obesity, unspecified: Secondary | ICD-10-CM

## 2013-03-15 DIAGNOSIS — I10 Essential (primary) hypertension: Secondary | ICD-10-CM

## 2013-03-15 DIAGNOSIS — R7402 Elevation of levels of lactic acid dehydrogenase (LDH): Secondary | ICD-10-CM

## 2013-03-15 DIAGNOSIS — E7800 Pure hypercholesterolemia, unspecified: Secondary | ICD-10-CM

## 2013-03-15 DIAGNOSIS — R5383 Other fatigue: Secondary | ICD-10-CM

## 2013-03-15 DIAGNOSIS — R5381 Other malaise: Secondary | ICD-10-CM

## 2013-03-15 DIAGNOSIS — R740 Nonspecific elevation of levels of transaminase and lactic acid dehydrogenase [LDH]: Secondary | ICD-10-CM

## 2013-03-15 LAB — COMPREHENSIVE METABOLIC PANEL
ALBUMIN: 4.2 g/dL (ref 3.5–5.2)
ALK PHOS: 67 U/L (ref 39–117)
ALT: 31 U/L (ref 0–53)
AST: 37 U/L (ref 0–37)
BUN: 15 mg/dL (ref 6–23)
CALCIUM: 8.9 mg/dL (ref 8.4–10.5)
CHLORIDE: 105 meq/L (ref 96–112)
CO2: 26 mEq/L (ref 19–32)
Creatinine, Ser: 1 mg/dL (ref 0.4–1.5)
GFR: 80.73 mL/min (ref >60.00)
Glucose, Bld: 123 mg/dL — ABNORMAL HIGH (ref 70–99)
Potassium: 4.5 mEq/L (ref 3.5–5.1)
Sodium: 139 mEq/L (ref 135–145)
Total Bilirubin: 1.3 mg/dL — ABNORMAL HIGH (ref 0.3–1.2)
Total Protein: 7.5 g/dL (ref 6.0–8.3)

## 2013-03-15 LAB — TSH: TSH: 0.99 u[IU]/mL (ref 0.35–5.50)

## 2013-03-15 LAB — HEMOGLOBIN A1C: HEMOGLOBIN A1C: 8.7 % — AB (ref 4.6–6.5)

## 2013-03-16 ENCOUNTER — Other Ambulatory Visit: Payer: Medicare Other

## 2013-03-18 ENCOUNTER — Encounter: Payer: Medicare Other | Admitting: Family Medicine

## 2013-03-23 ENCOUNTER — Encounter: Payer: Medicare Other | Admitting: Family Medicine

## 2013-03-23 ENCOUNTER — Ambulatory Visit: Payer: Medicare Other

## 2013-03-25 ENCOUNTER — Ambulatory Visit: Payer: Medicare Other

## 2013-04-01 ENCOUNTER — Encounter: Payer: Self-pay | Admitting: Family Medicine

## 2013-04-01 ENCOUNTER — Other Ambulatory Visit: Payer: Self-pay

## 2013-04-01 ENCOUNTER — Ambulatory Visit (INDEPENDENT_AMBULATORY_CARE_PROVIDER_SITE_OTHER): Payer: Medicare Other | Admitting: Family Medicine

## 2013-04-01 VITALS — BP 154/80 | HR 99 | Temp 97.6°F | Ht 71.0 in | Wt 254.5 lb

## 2013-04-01 DIAGNOSIS — E119 Type 2 diabetes mellitus without complications: Secondary | ICD-10-CM

## 2013-04-01 DIAGNOSIS — Z7901 Long term (current) use of anticoagulants: Secondary | ICD-10-CM

## 2013-04-01 DIAGNOSIS — Z954 Presence of other heart-valve replacement: Secondary | ICD-10-CM

## 2013-04-01 DIAGNOSIS — Z5181 Encounter for therapeutic drug level monitoring: Secondary | ICD-10-CM

## 2013-04-01 DIAGNOSIS — I4891 Unspecified atrial fibrillation: Secondary | ICD-10-CM

## 2013-04-01 DIAGNOSIS — Z23 Encounter for immunization: Secondary | ICD-10-CM

## 2013-04-01 DIAGNOSIS — I10 Essential (primary) hypertension: Secondary | ICD-10-CM

## 2013-04-01 DIAGNOSIS — Z0000 Encounter for general adult medical examination without abnormal findings: Secondary | ICD-10-CM

## 2013-04-01 DIAGNOSIS — I359 Nonrheumatic aortic valve disorder, unspecified: Secondary | ICD-10-CM

## 2013-04-01 LAB — POCT INR: INR: 3.6

## 2013-04-01 MED ORDER — FUROSEMIDE 20 MG PO TABS: 20.0000 mg | ORAL_TABLET | Freq: Every day | ORAL | Status: DC | PRN

## 2013-04-01 NOTE — Patient Instructions (Addendum)
Check with your insurance to see if they will cover the shingles shot. Call about an eye exam for diabetes.   Stop the creon for now.  If you have greasy stools restart it and let me know.   Recheck an A1c in 3 months before a visit.  Work on [employer] in the meantime.

## 2013-04-01 NOTE — Progress Notes (Signed)
Pre visit review using our clinic review tool, if applicable. No additional management support is needed unless otherwise documented below in the visit note.  CPE- See plan.  Routine anticipatory guidance given to patient.  See health maintenance. Tetanus today.  Shingles d/w pt.  PNA 2011 Flu 2014 Diet and exercise d/w pt.  Prostate cancer screening and PSA options (with potential risks and benefits of testing vs not testing) were discussed along with recent recs/guidelines.  He declined testing PSA at this point. Colonoscopy 2013.  Living will d/w pt.  Wife designated if incapacitated.    DM2.  Pancreatic function prev improved some, able to skip the creon some without adverse event.  Weight isn't down.  Working on insulin dosing, with sugar 150-200 in AMs.  No low. No foot complaints.  Compliant with insulin. Discussed diet and exercise.   HTN.  No CP, SOB, BLE.  Still on coumadin with his valve. No atypical bleeding. Can walk ~1/2 mile.   PMH and SH reviewed  Meds, vitals, and allergies reviewed.   ROS: See HPI.  Otherwise negative.    GEN: nad, alert and oriented HEENT: mucous membranes moist NECK: supple w/o LA CV: rrr. Audible click from valve noted PULM: ctab, no inc wob ABD: soft, +bs EXT: no edema SKIN: no acute rash  Diabetic foot exam: Normal inspection No skin breakdown No calluses  Normal DP pulses Normal sensation to light touch and monofilament Nails normal

## 2013-04-02 ENCOUNTER — Encounter: Payer: Self-pay | Admitting: Family Medicine

## 2013-04-02 DIAGNOSIS — Z0000 Encounter for general adult medical examination without abnormal findings: Secondary | ICD-10-CM | POA: Insufficient documentation

## 2013-04-02 NOTE — Assessment & Plan Note (Signed)
He'll work on diet, titrate insulin and recheck in about 3 months.  D/w pt.  He can likely stop creon, may need to eventually restart.  D/w pt. He agrees.

## 2013-04-02 NOTE — Assessment & Plan Note (Signed)
Didn't change meds today, with work on diet and exercise this should improve. He agrees.

## 2013-04-02 NOTE — Assessment & Plan Note (Signed)
Routine anticipatory guidance given to patient.  See health maintenance. Tetanus 2015.  Shingles d/w pt.  PNA 2011 Flu 2014 Diet and exercise d/w pt.  Prostate cancer screening and PSA options (with potential risks and benefits of testing vs not testing) were discussed along with recent recs/guidelines.  He declined testing PSA at this point. Colonoscopy 2013.  Living will d/w pt.  Wife designated if incapacitated.

## 2013-04-27 ENCOUNTER — Other Ambulatory Visit: Payer: Self-pay | Admitting: *Deleted

## 2013-04-27 MED ORDER — WARFARIN SODIUM 3 MG PO TABS: ORAL_TABLET | ORAL | Status: DC

## 2013-05-07 ENCOUNTER — Ambulatory Visit (INDEPENDENT_AMBULATORY_CARE_PROVIDER_SITE_OTHER): Payer: Medicare Other | Admitting: Family Medicine

## 2013-05-07 ENCOUNTER — Encounter: Payer: Self-pay | Admitting: Family Medicine

## 2013-05-07 VITALS — BP 148/80 | HR 68 | Temp 98.0°F | Wt 251.2 lb

## 2013-05-07 DIAGNOSIS — J069 Acute upper respiratory infection, unspecified: Secondary | ICD-10-CM

## 2013-05-07 MED ORDER — BENZONATATE 200 MG PO CAPS: 200.0000 mg | ORAL_CAPSULE | Freq: Three times a day (TID) | ORAL | Status: DC | PRN

## 2013-05-07 NOTE — Patient Instructions (Signed)
Use the flonase AM and PM (8 sprays a day) for now, then go back to just AM use.  Use the tessalon in the meantime for the cough and that should help.  Take care. Update me if needed.  Glad to see you.

## 2013-05-07 NOTE — Progress Notes (Signed)
Pre visit review using our review tool, if applicable. No additional management support is needed unless otherwise documented below in the visit note.  A few days with progressive cough and fatigue.  Tickle in his throat.  Ribs sore from coughing.  L ear feels abnormal.  Nose isn't stuffy.  Some ST and tickle in his throat.  tmax 99.  No rash.  Clear sputum.   He stopped creon w/o any troubles.  Discussed.  Okay to stay off for now.  He worked on diet, his insulin requirement came down to 9 units.    Taking 3mg  +1mg  coumadin a day, every day.    Meds, vitals, and allergies reviewed.   ROS: See HPI.  Otherwise, noncontributory.  GEN: nad, alert and oriented HEENT: mucous membranes moist, tm w/o erythema, nasal exam w/o erythema, clear discharge noted,  OP with cobblestoning NECK: supple w/o LA CV: rrr, typical valve click noted PULM: ctab, no inc wob, cough noted.  EXT: no edema SKIN: no acute rash Pain with cough better with giving himself a hug.   L ETD noted on exam.

## 2013-05-09 DIAGNOSIS — J069 Acute upper respiratory infection, unspecified: Secondary | ICD-10-CM | POA: Insufficient documentation

## 2013-05-09 NOTE — Assessment & Plan Note (Signed)
Likely viral, nontoxic, with ETD dysfunction and chest wall sx.  Reassured.  Anatomy d/w pt.  Tessalon for cough, inc flonase in meantime.  He agrees.

## 2013-05-13 ENCOUNTER — Ambulatory Visit: Payer: Medicare Other

## 2013-05-17 LAB — HM DIABETES EYE EXAM

## 2013-05-24 ENCOUNTER — Encounter: Payer: Self-pay | Admitting: Family Medicine

## 2013-05-27 ENCOUNTER — Other Ambulatory Visit: Payer: Self-pay | Admitting: *Deleted

## 2013-05-27 NOTE — Telephone Encounter (Signed)
Needs 1 mg coumadin refilled

## 2013-05-28 MED ORDER — WARFARIN SODIUM 1 MG PO TABS
ORAL_TABLET | ORAL | Status: DC
Start: ? — End: 2013-09-03

## 2013-06-03 ENCOUNTER — Ambulatory Visit (INDEPENDENT_AMBULATORY_CARE_PROVIDER_SITE_OTHER): Payer: Medicare Other | Admitting: Family Medicine

## 2013-06-03 DIAGNOSIS — I4891 Unspecified atrial fibrillation: Secondary | ICD-10-CM

## 2013-06-03 DIAGNOSIS — Z5181 Encounter for therapeutic drug level monitoring: Secondary | ICD-10-CM

## 2013-06-03 DIAGNOSIS — Z954 Presence of other heart-valve replacement: Secondary | ICD-10-CM

## 2013-06-03 DIAGNOSIS — I359 Nonrheumatic aortic valve disorder, unspecified: Secondary | ICD-10-CM

## 2013-06-03 DIAGNOSIS — Z7901 Long term (current) use of anticoagulants: Secondary | ICD-10-CM

## 2013-06-03 LAB — POCT INR: INR: 2.3

## 2013-07-07 ENCOUNTER — Other Ambulatory Visit: Payer: Self-pay | Admitting: Family Medicine

## 2013-07-15 ENCOUNTER — Other Ambulatory Visit (INDEPENDENT_AMBULATORY_CARE_PROVIDER_SITE_OTHER): Payer: Medicare Other

## 2013-07-15 ENCOUNTER — Ambulatory Visit (INDEPENDENT_AMBULATORY_CARE_PROVIDER_SITE_OTHER): Payer: Medicare Other | Admitting: Family Medicine

## 2013-07-15 DIAGNOSIS — Z7901 Long term (current) use of anticoagulants: Secondary | ICD-10-CM

## 2013-07-15 DIAGNOSIS — Z5181 Encounter for therapeutic drug level monitoring: Secondary | ICD-10-CM

## 2013-07-15 DIAGNOSIS — I359 Nonrheumatic aortic valve disorder, unspecified: Secondary | ICD-10-CM

## 2013-07-15 DIAGNOSIS — Z954 Presence of other heart-valve replacement: Secondary | ICD-10-CM

## 2013-07-15 DIAGNOSIS — I4891 Unspecified atrial fibrillation: Secondary | ICD-10-CM

## 2013-07-15 DIAGNOSIS — E119 Type 2 diabetes mellitus without complications: Secondary | ICD-10-CM

## 2013-07-15 LAB — HEMOGLOBIN A1C: Hgb A1c MFr Bld: 8.2 % — ABNORMAL HIGH (ref 4.6–6.5)

## 2013-07-15 LAB — POCT INR: INR: 3.9

## 2013-07-22 ENCOUNTER — Ambulatory Visit (INDEPENDENT_AMBULATORY_CARE_PROVIDER_SITE_OTHER): Payer: Medicare Other | Admitting: Family Medicine

## 2013-07-22 ENCOUNTER — Encounter: Payer: Self-pay | Admitting: Family Medicine

## 2013-07-22 VITALS — BP 150/80 | HR 62 | Temp 97.4°F | Wt 247.5 lb

## 2013-07-22 DIAGNOSIS — E119 Type 2 diabetes mellitus without complications: Secondary | ICD-10-CM

## 2013-07-22 MED ORDER — METFORMIN HCL 500 MG PO TABS: ORAL_TABLET | ORAL | Status: DC

## 2013-07-22 NOTE — Assessment & Plan Note (Signed)
At this point, okay to try metformin.  Will slowly inc metformin dose, will likely taper off insulin.  See instructions.  D/w pt.  He agrees.  Okay for outpatient f/u.  Recheck later in 2015.

## 2013-07-22 NOTE — Patient Instructions (Signed)
Start taking 1 metformin a day.  Do that for about 1 week, then up to 1+1.  Keep increasing by 1 pill a week if needed (2+1, then 2+2).  Max 4 pills a day.  If GI upset, then cut the dose back.  If AM sugar is <150, then cut your insulin by 1 unit.  You'll likely wean off the insulin.  If you get down to 4 or 5 units, then stop the insulin.  Recheck in about 3 months.  Call back as needed.   Take care.

## 2013-07-22 NOTE — Progress Notes (Signed)
Pre visit review using our clinic review tool, if applicable. No additional management support is needed unless otherwise documented below in the visit note.  Diabetes:  Using medications without difficulties:yes Hypoglycemic episodes:no Hyperglycemic episodes :rarely >200 Feet problems:no Blood Sugars averaging: 140 or lower eye exam within last year: Only on 7 units of insulin a day.  Down from prev dose.  Losing weight intentionally.   His brother died and I offered my condolences.  He was at [facility] then [facility].    Meds, vitals, and allergies reviewed.   ROS: See HPI.  Otherwise negative.    GEN: nad, alert and oriented HEENT: mucous membranes moist NECK: supple w/o LA CV: rrr. PULM: ctab, no inc wob ABD: soft, +bs EXT: no edema SKIN: no acute rash

## 2013-08-26 ENCOUNTER — Ambulatory Visit: Payer: Medicare Other

## 2013-08-30 ENCOUNTER — Ambulatory Visit (INDEPENDENT_AMBULATORY_CARE_PROVIDER_SITE_OTHER): Payer: Medicare Other | Admitting: Family Medicine

## 2013-08-30 DIAGNOSIS — Z5181 Encounter for therapeutic drug level monitoring: Secondary | ICD-10-CM

## 2013-08-30 DIAGNOSIS — Z7901 Long term (current) use of anticoagulants: Secondary | ICD-10-CM

## 2013-08-30 DIAGNOSIS — I359 Nonrheumatic aortic valve disorder, unspecified: Secondary | ICD-10-CM

## 2013-08-30 DIAGNOSIS — Z954 Presence of other heart-valve replacement: Secondary | ICD-10-CM

## 2013-08-30 LAB — POCT INR: INR: 3.6

## 2013-09-03 ENCOUNTER — Other Ambulatory Visit: Payer: Self-pay | Admitting: *Deleted

## 2013-09-03 MED ORDER — FLUTICASONE PROPIONATE 50 MCG/ACT NA SUSP: NASAL | Status: DC

## 2013-09-03 MED ORDER — WARFARIN SODIUM 1 MG PO TABS: ORAL_TABLET | ORAL | Status: DC

## 2013-09-09 ENCOUNTER — Other Ambulatory Visit: Payer: Self-pay | Admitting: *Deleted

## 2013-09-09 MED ORDER — WARFARIN SODIUM 3 MG PO TABS: ORAL_TABLET | ORAL | Status: DC

## 2013-10-06 ENCOUNTER — Other Ambulatory Visit: Payer: Self-pay | Admitting: Family Medicine

## 2013-10-06 MED ORDER — LOSARTAN POTASSIUM 50 MG PO TABS: 50.0000 mg | ORAL_TABLET | Freq: Every day | ORAL | Status: DC

## 2013-10-07 ENCOUNTER — Ambulatory Visit (INDEPENDENT_AMBULATORY_CARE_PROVIDER_SITE_OTHER): Payer: Medicare Other | Admitting: Family Medicine

## 2013-10-07 DIAGNOSIS — Z5181 Encounter for therapeutic drug level monitoring: Secondary | ICD-10-CM

## 2013-10-07 DIAGNOSIS — Z954 Presence of other heart-valve replacement: Secondary | ICD-10-CM

## 2013-10-07 DIAGNOSIS — Z7901 Long term (current) use of anticoagulants: Secondary | ICD-10-CM

## 2013-10-07 DIAGNOSIS — I359 Nonrheumatic aortic valve disorder, unspecified: Secondary | ICD-10-CM

## 2013-10-07 LAB — POCT INR: INR: 2.6

## 2013-10-11 ENCOUNTER — Telehealth: Payer: Self-pay

## 2013-10-11 MED ORDER — TRIAMCINOLONE ACETONIDE 0.1 % EX CREA: 1.0000 "application " | TOPICAL_CREAM | Freq: Two times a day (BID) | CUTANEOUS | Status: DC

## 2013-10-11 NOTE — Telephone Encounter (Signed)
Sent TAC rx. Use prn. Fu prn. Thanks.

## 2013-10-11 NOTE — Telephone Encounter (Signed)
Mrs Huelsmann left v/m; pt has gaulded area, reddened area between both legs; pt has been working out side and sweating a lot. Request med sent to [facility]. Unable to reach pt or pts wife by phone for more info.

## 2013-10-12 ENCOUNTER — Other Ambulatory Visit: Payer: Self-pay

## 2013-10-12 MED ORDER — POTASSIUM CHLORIDE CRYS ER 20 MEQ PO TBCR: 20.0000 meq | EXTENDED_RELEASE_TABLET | Freq: Every day | ORAL | Status: DC

## 2013-10-12 NOTE — Telephone Encounter (Signed)
Pt's wife made aware. 

## 2013-10-24 ENCOUNTER — Other Ambulatory Visit: Payer: Self-pay | Admitting: Family Medicine

## 2013-10-24 DIAGNOSIS — E119 Type 2 diabetes mellitus without complications: Secondary | ICD-10-CM

## 2013-10-26 ENCOUNTER — Other Ambulatory Visit (INDEPENDENT_AMBULATORY_CARE_PROVIDER_SITE_OTHER): Payer: Medicare Other

## 2013-10-26 DIAGNOSIS — E119 Type 2 diabetes mellitus without complications: Secondary | ICD-10-CM

## 2013-10-26 LAB — LIPID PANEL
CHOL/HDL RATIO: 5
Cholesterol: 182 mg/dL (ref 0–200)
HDL: 37.8 mg/dL — ABNORMAL LOW (ref >39.00)
LDL Cholesterol: 122 mg/dL — ABNORMAL HIGH (ref 0–99)
NonHDL: 144.2
TRIGLYCERIDES: 112 mg/dL (ref 0.0–149.0)
VLDL: 22.4 mg/dL (ref 0.0–40.0)

## 2013-10-26 LAB — HEMOGLOBIN A1C: Hgb A1c MFr Bld: 6.9 % — ABNORMAL HIGH (ref 4.6–6.5)

## 2013-10-29 ENCOUNTER — Ambulatory Visit: Payer: Medicare Other | Admitting: Family Medicine

## 2013-11-01 ENCOUNTER — Encounter: Payer: Self-pay | Admitting: Family Medicine

## 2013-11-01 ENCOUNTER — Ambulatory Visit (INDEPENDENT_AMBULATORY_CARE_PROVIDER_SITE_OTHER): Payer: Medicare Other | Admitting: Family Medicine

## 2013-11-01 VITALS — BP 140/80 | HR 60 | Temp 97.7°F | Wt 248.5 lb

## 2013-11-01 DIAGNOSIS — E119 Type 2 diabetes mellitus without complications: Secondary | ICD-10-CM

## 2013-11-01 DIAGNOSIS — Z23 Encounter for immunization: Secondary | ICD-10-CM

## 2013-11-01 NOTE — Progress Notes (Signed)
Pre visit review using our clinic review tool, if applicable. No additional management support is needed unless otherwise documented below in the visit note.  His mother in law and her sister recently moved in. They had to remodel some.    Diabetes:  Using medications without difficulties: yes, off insulin.  No GI sx.   Hypoglycemic episodes: rare sx, if prolonged fasting Hyperglycemic episodes: no Feet problems: no Blood Sugars averaging: 120-140 eye exam within last year: yes A1c improved, dw pt.    Lipids acceptable for now.  D/w pt.    Recent URI sx, improved now.    Meds, vitals, and allergies reviewed.   ROS: See HPI.  Otherwise negative.    GEN: nad, alert and oriented HEENT: mucous membranes moist NECK: supple w/o LA CV: rrr. Click noted. PULM: ctab, no inc wob ABD: soft, +bs EXT: no edema SKIN: no acute rash

## 2013-11-01 NOTE — Patient Instructions (Signed)
Recheck A1c in 3 months before a visit.   If you have a lot of low sugars, then cut the metformin back to 1 a day and notify me if needed.   Glad to see you.

## 2013-11-03 NOTE — Assessment & Plan Note (Signed)
A1c improved, d/w pt.  Off insulin. Continue metformin for now, but may need to cut dose if lower sugars noted with more weight loss.  He agrees.  Recheck A1c in 3 months before a visit.

## 2013-11-15 ENCOUNTER — Ambulatory Visit (INDEPENDENT_AMBULATORY_CARE_PROVIDER_SITE_OTHER): Payer: Medicare Other | Admitting: *Deleted

## 2013-11-15 DIAGNOSIS — Z5181 Encounter for therapeutic drug level monitoring: Secondary | ICD-10-CM

## 2013-11-15 DIAGNOSIS — Z7901 Long term (current) use of anticoagulants: Secondary | ICD-10-CM

## 2013-11-15 LAB — POCT INR: INR: 2.9

## 2013-11-27 ENCOUNTER — Other Ambulatory Visit: Payer: Self-pay | Admitting: Family Medicine

## 2013-12-01 ENCOUNTER — Other Ambulatory Visit: Payer: Self-pay | Admitting: *Deleted

## 2013-12-01 MED ORDER — FLUTICASONE PROPIONATE 50 MCG/ACT NA SUSP: NASAL | Status: DC

## 2013-12-20 ENCOUNTER — Other Ambulatory Visit: Payer: Self-pay | Admitting: Family Medicine

## 2013-12-21 ENCOUNTER — Emergency Department: Payer: Self-pay | Admitting: Emergency Medicine

## 2013-12-27 ENCOUNTER — Other Ambulatory Visit (INDEPENDENT_AMBULATORY_CARE_PROVIDER_SITE_OTHER): Payer: Medicare Other

## 2013-12-27 ENCOUNTER — Ambulatory Visit: Payer: Medicare Other

## 2013-12-27 DIAGNOSIS — Z5181 Encounter for therapeutic drug level monitoring: Secondary | ICD-10-CM

## 2013-12-27 LAB — PROTIME-INR
INR: 2.9 ratio — ABNORMAL HIGH (ref 0.8–1.0)
Prothrombin Time: 31.6 s — ABNORMAL HIGH (ref 9.6–13.1)

## 2014-01-12 ENCOUNTER — Ambulatory Visit (INDEPENDENT_AMBULATORY_CARE_PROVIDER_SITE_OTHER): Payer: Medicare Other

## 2014-01-12 DIAGNOSIS — Z5181 Encounter for therapeutic drug level monitoring: Secondary | ICD-10-CM

## 2014-01-12 DIAGNOSIS — I4891 Unspecified atrial fibrillation: Secondary | ICD-10-CM

## 2014-01-24 ENCOUNTER — Ambulatory Visit (INDEPENDENT_AMBULATORY_CARE_PROVIDER_SITE_OTHER): Payer: Medicare Other | Admitting: Family Medicine

## 2014-01-24 DIAGNOSIS — I4891 Unspecified atrial fibrillation: Secondary | ICD-10-CM

## 2014-01-24 DIAGNOSIS — Z5181 Encounter for therapeutic drug level monitoring: Secondary | ICD-10-CM

## 2014-01-24 LAB — POCT INR: INR: 3

## 2014-01-25 ENCOUNTER — Other Ambulatory Visit: Payer: Self-pay | Admitting: *Deleted

## 2014-01-25 ENCOUNTER — Other Ambulatory Visit: Payer: Self-pay | Admitting: Family Medicine

## 2014-01-25 DIAGNOSIS — E119 Type 2 diabetes mellitus without complications: Secondary | ICD-10-CM

## 2014-01-25 MED ORDER — GLUCOSE BLOOD VI STRP: ORAL_STRIP | Status: DC

## 2014-01-31 ENCOUNTER — Other Ambulatory Visit (INDEPENDENT_AMBULATORY_CARE_PROVIDER_SITE_OTHER): Payer: Medicare Other

## 2014-01-31 ENCOUNTER — Other Ambulatory Visit: Payer: Self-pay | Admitting: Family Medicine

## 2014-01-31 DIAGNOSIS — E119 Type 2 diabetes mellitus without complications: Secondary | ICD-10-CM | POA: Diagnosis not present

## 2014-01-31 LAB — HEMOGLOBIN A1C: HEMOGLOBIN A1C: 8.5 % — AB (ref 4.6–6.5)

## 2014-02-03 ENCOUNTER — Ambulatory Visit (INDEPENDENT_AMBULATORY_CARE_PROVIDER_SITE_OTHER): Payer: Medicare Other | Admitting: Family Medicine

## 2014-02-03 ENCOUNTER — Encounter: Payer: Self-pay | Admitting: Family Medicine

## 2014-02-03 VITALS — BP 142/70 | HR 60 | Temp 97.8°F | Wt 250.8 lb

## 2014-02-03 DIAGNOSIS — E119 Type 2 diabetes mellitus without complications: Secondary | ICD-10-CM

## 2014-02-03 DIAGNOSIS — L821 Other seborrheic keratosis: Secondary | ICD-10-CM

## 2014-02-03 MED ORDER — METFORMIN HCL 500 MG PO TABS: 500.0000 mg | ORAL_TABLET | Freq: Three times a day (TID) | ORAL | Status: DC

## 2014-02-03 NOTE — Progress Notes (Signed)
Pre visit review using our clinic review tool, if applicable. No additional management support is needed unless otherwise documented below in the visit note.  Diabetes:  Using medications without difficulties:yes Hypoglycemic episodes:no Hyperglycemic episodes: prev yes, improved with extra metformin dose, now tid Feet problems: no Blood Sugars averaging: now ~150, prev up to ~200.  Diet changed over the holidays.  He is working on restarting DM2 diet.  eye exam within last year: yes A1c up, d/w pt.    He had an itchy area on his R lower leg, where he had an insect bite last year, ie not recently.  SK noted on R calf at the site, irritated.  No ulceration.   PMH and SH reviewed  Meds, vitals, and allergies reviewed.   ROS: See HPI.  Otherwise negative.    GEN: nad, alert and oriented HEENT: mucous membranes moist NECK: supple w/o LA CV: rrr. PULM: ctab, no inc wob ABD: soft, +bs EXT: no edema SKIN: no acute rash but small (a few mm diameter)  SK noted on R calf at the site, irritated.    Diabetic foot exam: Normal inspection No skin breakdown No calluses  Normal DP pulses Normal sensation to light touch and monofilament Nails normal

## 2014-02-03 NOTE — Patient Instructions (Addendum)
If you are going to have dental work done (like a pulled tooth), then have the dental clinic contact us.  We can make plans at that point.   Schedule a physical with labs ahead of time in about 3-4 months. Continue on 3 metformin a day for now, cut back to 2 a day if needed.  Cut back on the doughnuts.  Keep the spot on your leg covered with a bandaid if needed.  It should blister up and/or flake off.  Take care.  Glad to see you.

## 2014-02-04 DIAGNOSIS — L821 Other seborrheic keratosis: Secondary | ICD-10-CM | POA: Insufficient documentation

## 2014-02-04 NOTE — Assessment & Plan Note (Signed)
Inflamed SK on the leg, d/w pt.  Offered options, then treated with liq N2 x3, no complications.  Tolerated well. Routine instructions given.  F/u prn.  He agrees.

## 2014-02-04 NOTE — Assessment & Plan Note (Addendum)
Labs d/w pt.  He's back on DM2 diet.  Continue on 3 metformin a day for now, cut back to 2 a day if needed.  Recheck labs in about 3-4 months.  He agrees.

## 2014-02-08 ENCOUNTER — Other Ambulatory Visit: Payer: Self-pay | Admitting: *Deleted

## 2014-02-08 MED ORDER — WARFARIN SODIUM 3 MG PO TABS: ORAL_TABLET | ORAL | Status: DC

## 2014-02-15 ENCOUNTER — Other Ambulatory Visit: Payer: Self-pay | Admitting: Family Medicine

## 2014-02-15 NOTE — Telephone Encounter (Signed)
Electronic refill request. Last Filled:    30 tablet 0 Rf on 12/20/2013  Please advise.

## 2014-02-16 NOTE — Telephone Encounter (Signed)
Sent. Thanks.   

## 2014-02-23 ENCOUNTER — Other Ambulatory Visit: Payer: Self-pay

## 2014-02-23 MED ORDER — CARVEDILOL 6.25 MG PO TABS: 6.2500 mg | ORAL_TABLET | Freq: Two times a day (BID) | ORAL | Status: DC

## 2014-03-07 ENCOUNTER — Other Ambulatory Visit (INDEPENDENT_AMBULATORY_CARE_PROVIDER_SITE_OTHER): Payer: Medicare Other

## 2014-03-07 DIAGNOSIS — Z5181 Encounter for therapeutic drug level monitoring: Secondary | ICD-10-CM

## 2014-03-07 LAB — POCT INR: INR: 2.2

## 2014-03-11 ENCOUNTER — Telehealth: Payer: Self-pay | Admitting: Family Medicine

## 2014-03-11 ENCOUNTER — Emergency Department: Payer: Self-pay | Admitting: Emergency Medicine

## 2014-03-11 MED ORDER — TRIAMCINOLONE ACETONIDE 0.1 % EX CREA: 1.0000 "application " | TOPICAL_CREAM | Freq: Two times a day (BID) | CUTANEOUS | Status: DC

## 2014-03-11 NOTE — Telephone Encounter (Signed)
Please try to see what update you can get and let me know.  Thanks.

## 2014-03-11 NOTE — Telephone Encounter (Signed)
Patient Name: [name] [name]  DOB: 1950-03-27    Initial Comment Caller states husband has rash, it is itchy.    Nurse Assessment  Nurse: Mallie Mussel, RN, [name] Date/Time Eilene Ghazi Time): 03/11/2014 2:40:06 PM  Confirm and document reason for call. If symptomatic, describe symptoms. ---Caller states that her husband has a rash on both of his arms for the past several days. She describes it as red raised bumps. He has had this for about a week now. The rash is itchy. Denies fever. On his left arm, there is a knot underneath the rash. Her husband is not with her at present. I recommended that she contact us when she is with him so we can do an assessment of his rash. She verbalized understanding.  Has the patient traveled out of the country within the last 30 days? ---Not Applicable  Does the patient require triage? ---No     Guidelines    Guideline Title Affirmed Question Affirmed Notes       Final Disposition User   Clinical Call Mallie Mussel, RN, [name]    Comments  [phone]- Attempt made. No answer after 10 rings.

## 2014-03-11 NOTE — Telephone Encounter (Signed)
If he has had it 1 week and he thinks it is poison ivy or similar, then okay to try TAC cream.  I sent it in, in case he was out.  If that isn't helping, then he needs to be checked.  Thanks.

## 2014-03-11 NOTE — Telephone Encounter (Signed)
Wife advised. 

## 2014-03-11 NOTE — Telephone Encounter (Signed)
[facility] Patient Name: [name] [name] DOB: 1950/10/04 Initial Comment Caller states that her husband has a rash on both of his arms forthe past several days. She describes it as red raised bumps. He hashad this for about a week now. The rash is itchy. Denies fever.On his left arm, there is a knot underneath the rash. Her husbandis not with her at present. I recommended that she contact uswhen she is with him so we can do an assessment of his rash. Sheverbalized understanding. Nurse Assessment Nurse: Martyn Ehrich RN, Felicia Date/Time (Eastern Time): 03/11/2014 3:19:43 PM Confirm and document reason for call. If symptomatic, describe symptoms. ---Pt has raised rash on both arms onset 1 wk ago. L arm feels like it has a knot inside forearm 1/2 way up. He thought it was poison oak. Has the patient traveled out of the country within the last 30 days? ---No Does the patient require triage? ---Yes Related visit to physician within the last 2 weeks? ---No Does the PT have any chronic conditions? (i.e. diabetes, asthma, etc.) ---Yes List chronic conditions. ---DM Guidelines Guideline Title Affirmed Question Affirmed Notes Spider Bite - [place] [1] Rash elsewhere on body AND [2] developed after spider bite Final Disposition User Go to ED Now (or PCP triage) Martyn Ehrich, RN, Felicia Comments He has a brown scab in the middle of a lump with bumps around it - he doesn't believe it is a boil. His MD is Elsie Stain Pt was in an area where spiders could be and mentioned thatPLEASE NOTE: All timestamps contained within this report are represented as [place] Standard Time. CONFIDENTIALTY NOTICE: This fax transmission is intended only for the addressee. It contains information that is legally privileged, confidential or otherwise protected from use or disclosure. If you are not the intended recipient, you  are strictly prohibited from reviewing, disclosing, copying using or disseminating any of this information or taking any action in reliance on or regarding this information. If you have received this fax in error, please notify us immediately by telephone so that we can arrange for its return to [place]. Phone: [phone], Toll-Free: [phone], Fax: [phone] Page: 1 of 1 Call Id: [id number]

## 2014-03-14 ENCOUNTER — Other Ambulatory Visit: Payer: Self-pay | Admitting: Family Medicine

## 2014-04-18 ENCOUNTER — Other Ambulatory Visit (INDEPENDENT_AMBULATORY_CARE_PROVIDER_SITE_OTHER): Payer: Medicare Other

## 2014-04-18 ENCOUNTER — Other Ambulatory Visit: Payer: Self-pay | Admitting: Family Medicine

## 2014-04-18 DIAGNOSIS — Z5181 Encounter for therapeutic drug level monitoring: Secondary | ICD-10-CM

## 2014-04-18 LAB — POCT INR: INR: 3.1

## 2014-04-18 NOTE — Telephone Encounter (Signed)
Received refill request electronically. See allergy/contraindication. Is it okay to refill medication? 

## 2014-04-19 NOTE — Telephone Encounter (Signed)
Okay, sent. Thanks.

## 2014-04-28 ENCOUNTER — Telehealth: Payer: Self-pay

## 2014-04-28 ENCOUNTER — Telehealth: Payer: Self-pay | Admitting: Family Medicine

## 2014-04-28 NOTE — Telephone Encounter (Signed)
PLEASE NOTE: All timestamps contained within this report are represented as [place] Standard Time. CONFIDENTIALTY NOTICE: This fax transmission is intended only for the addressee. It contains information that is legally privileged, confidential or otherwise protected from use or disclosure. If you are not the intended recipient, you are strictly prohibited from reviewing, disclosing, copying using or disseminating any of this information or taking any action in reliance on or regarding this information. If you have received this fax in error, please notify us immediately by telephone so that we can arrange for its return to [place]. Phone: [phone], Toll-Free: [phone], Fax: [phone] Page: 1 of 1 Call Id: [id number] [facility] Patient Name: [name] [name] Gender: Male DOB: 10-Sep-1950 Age: 64 Y 2 M Return Phone Number: [phone] (Primary) Address: City/State/Zip: [place] [address] Client [facility] - Client Client Site [facility] - Day Contact Type Call Caller Name na Caller Phone Number na Relationship To Patient Spouse Is this call to report lab results? No Call Type General Information Initial Comment Caller states needing to speak with nurse in office, connected her with backline General Information Type Call Transferred Nurse Assessment Guidelines Guideline Title Affirmed Question Affirmed Notes Nurse Date/Time (Electra Time) Disp. Time Eilene Ghazi Time) Disposition Final User 04/28/2014 11:01:13 AM General Information Provided Yes Fransico Michael After Care Instructions Given Call Event Type User Date / Time Description

## 2014-04-28 NOTE — Telephone Encounter (Signed)
Pt has CPX scheduled on 05/06/2014.

## 2014-04-28 NOTE — Telephone Encounter (Signed)
Noted, agreed, thanks

## 2014-04-28 NOTE — Telephone Encounter (Signed)
[facility] Patient Name: [name] [name] DOB: October 01, 1950 Initial Comment Caller states spouse has allergies and it's getting worse. Caller states pt has appt to be seen next week. Nurse Assessment Nurse: Erlene Quan, RN, Manuela Schwartz Date/Time Eilene Ghazi Time): 04/28/2014 11:16:45 AM Confirm and document reason for call. If symptomatic, describe symptoms. ---Caller states spouse has allergies and it's getting worse - they worked in the yard al day yesterday and now he is all congested and full -eyes are puffy and itchy they have tried OTC allergy medication and it did not help - states he took Zyrtex just one time 2 days ago and has not taken it again - wants to know if a zpack can be called in - Caller states pt has appt to be seen next week. but he needs to know what he can do now Has the patient traveled out of the country within the last 30 days? ---Not Applicable Does the patient require triage? ---Yes Related visit to physician within the last 2 weeks? ---No Does the PT have any chronic conditions? (i.e. diabetes, asthma, etc.) ---Yes List chronic conditions. ---diabetic Guidelines Guideline Title Affirmed Question Affirmed Notes Nasal Allergies (Hay Fever) [1] Nasal allergies AND [1] only certain times of year AND [3] diagnosis of hay fever has never been confirmed by a doctor Final Disposition User See PCP within 2 Christen Bame, RN, Manuela Schwartz Comments Advised caller that antibiotics will not help allergies - due to other health issues advised caller to begin doing nasal saline washes every day several times a day - states he has flonase he is not using - advised to do a saline wash prior to using his flonase in the morning and he needs to take the Zyrtex every day not just one time - advised can use OTC allergy eye drops if needed and cool compresses - advised have him shower and wash hair  every night after being outside and she may need to change bed linens - we bring pollen in with [place] - she is going to start these and see if he feels better advised to call back if not getting any better after 2 -3 days on medications

## 2014-05-01 ENCOUNTER — Other Ambulatory Visit: Payer: Self-pay | Admitting: Family Medicine

## 2014-05-01 DIAGNOSIS — Z5181 Encounter for therapeutic drug level monitoring: Secondary | ICD-10-CM

## 2014-05-01 DIAGNOSIS — E119 Type 2 diabetes mellitus without complications: Secondary | ICD-10-CM

## 2014-05-02 ENCOUNTER — Other Ambulatory Visit (INDEPENDENT_AMBULATORY_CARE_PROVIDER_SITE_OTHER): Payer: Medicare Other

## 2014-05-02 ENCOUNTER — Ambulatory Visit (INDEPENDENT_AMBULATORY_CARE_PROVIDER_SITE_OTHER): Payer: Medicare Other | Admitting: Family Medicine

## 2014-05-02 ENCOUNTER — Encounter: Payer: Self-pay | Admitting: Family Medicine

## 2014-05-02 ENCOUNTER — Other Ambulatory Visit: Payer: Self-pay | Admitting: Family Medicine

## 2014-05-02 VITALS — BP 142/80 | HR 73 | Temp 98.6°F | Wt 242.0 lb

## 2014-05-02 DIAGNOSIS — J0110 Acute frontal sinusitis, unspecified: Secondary | ICD-10-CM | POA: Diagnosis not present

## 2014-05-02 DIAGNOSIS — Z5181 Encounter for therapeutic drug level monitoring: Secondary | ICD-10-CM

## 2014-05-02 DIAGNOSIS — E119 Type 2 diabetes mellitus without complications: Secondary | ICD-10-CM

## 2014-05-02 LAB — CBC WITH DIFFERENTIAL/PLATELET
BASOS ABS: 0 10*3/uL (ref 0.0–0.1)
BASOS PCT: 0.5 % (ref 0.0–3.0)
EOS PCT: 1.1 % (ref 0.0–5.0)
Eosinophils Absolute: 0.1 10*3/uL (ref 0.0–0.7)
HCT: 46.9 % (ref 39.0–52.0)
HEMOGLOBIN: 16.1 g/dL (ref 13.0–17.0)
Lymphocytes Relative: 25.6 % (ref 12.0–46.0)
Lymphs Abs: 2.2 10*3/uL (ref 0.7–4.0)
MCHC: 34.4 g/dL (ref 30.0–36.0)
MCV: 91.3 fl (ref 78.0–100.0)
MONO ABS: 1 10*3/uL (ref 0.1–1.0)
Monocytes Relative: 12 % (ref 3.0–12.0)
NEUTROS ABS: 5.1 10*3/uL (ref 1.4–7.7)
Neutrophils Relative %: 60.8 % (ref 43.0–77.0)
Platelets: 213 10*3/uL (ref 150.0–400.0)
RBC: 5.14 Mil/uL (ref 4.22–5.81)
RDW: 13.9 % (ref 11.5–15.5)
WBC: 8.4 10*3/uL (ref 4.0–10.5)

## 2014-05-02 LAB — LIPID PANEL
CHOLESTEROL: 173 mg/dL (ref 0–200)
HDL: 45.8 mg/dL (ref >39.00)
LDL CALC: 108 mg/dL — AB (ref 0–99)
NonHDL: 127.2
Total CHOL/HDL Ratio: 4
Triglycerides: 96 mg/dL (ref 0.0–149.0)
VLDL: 19.2 mg/dL (ref 0.0–40.0)

## 2014-05-02 LAB — COMPREHENSIVE METABOLIC PANEL
ALBUMIN: 4.3 g/dL (ref 3.5–5.2)
ALK PHOS: 71 U/L (ref 39–117)
ALT: 27 U/L (ref 0–53)
AST: 25 U/L (ref 0–37)
BILIRUBIN TOTAL: 1 mg/dL (ref 0.2–1.2)
BUN: 11 mg/dL (ref 6–23)
CO2: 28 mEq/L (ref 19–32)
Calcium: 10.1 mg/dL (ref 8.4–10.5)
Chloride: 100 mEq/L (ref 96–112)
Creatinine, Ser: 0.91 mg/dL (ref 0.40–1.50)
GFR: 89.68 mL/min (ref >60.00)
GLUCOSE: 156 mg/dL — AB (ref 70–99)
POTASSIUM: 4.7 meq/L (ref 3.5–5.1)
Sodium: 135 mEq/L (ref 135–145)
Total Protein: 8.2 g/dL (ref 6.0–8.3)

## 2014-05-02 LAB — HEMOGLOBIN A1C: Hgb A1c MFr Bld: 7.4 % — ABNORMAL HIGH (ref 4.6–6.5)

## 2014-05-02 MED ORDER — DOXYCYCLINE HYCLATE 100 MG PO TABS: 100.0000 mg | ORAL_TABLET | Freq: Two times a day (BID) | ORAL | Status: DC

## 2014-05-02 MED ORDER — BENZONATATE 200 MG PO CAPS: 200.0000 mg | ORAL_CAPSULE | Freq: Three times a day (TID) | ORAL | Status: DC | PRN

## 2014-05-02 NOTE — Patient Instructions (Signed)
Start doxycycline.  Cut coumadin back to 2mg  a day for now.   I'll see you Friday, update me sooner if needed.  Continue tessalon for cough.

## 2014-05-02 NOTE — Assessment & Plan Note (Signed)
Start doxy, cut coumadin back to 2mg  a day, use tessalon for cough, continue nasal saline.  Nontoxic, f/u prn.  He agrees.

## 2014-05-02 NOTE — Progress Notes (Signed)
Pre visit review using our clinic review tool, if applicable. No additional management support is needed unless otherwise documented below in the visit note.  Coumadin: 4mg  a day except for 2mg  on Wednesdays.   Sx started about 2 weeks ago.  Started with cough and ST.  Worse in the last few days.  More head congestion/pressure.  Yellow rhinorrhea.  No fever, no chills, no vomiting.  Some occ loose stools.  HA from coughing.  Some cough, better recently.    Meds, vitals, and allergies reviewed.   ROS: See HPI.  Otherwise, noncontributory.  GEN: nad, alert and oriented HEENT: mucous membranes moist, tm w/o erythema, nasal exam w/o erythema, clear discharge noted,  OP with cobblestoning, frontal sinuses ttp NECK: supple w/o LA CV: rrr.  Click noted, at baseline PULM: ctab, no inc wob EXT: no edema SKIN: no acute rash

## 2014-05-06 ENCOUNTER — Encounter: Payer: Medicare Other | Admitting: Family Medicine

## 2014-05-09 ENCOUNTER — Other Ambulatory Visit: Payer: Self-pay | Admitting: *Deleted

## 2014-05-09 MED ORDER — CARVEDILOL 6.25 MG PO TABS
6.2500 mg | ORAL_TABLET | Freq: Two times a day (BID) | ORAL | Status: DC
Start: 2014-05-09 — End: 2014-05-31

## 2014-05-10 ENCOUNTER — Encounter: Payer: Self-pay | Admitting: Family Medicine

## 2014-05-10 ENCOUNTER — Ambulatory Visit (INDEPENDENT_AMBULATORY_CARE_PROVIDER_SITE_OTHER): Payer: Medicare Other | Admitting: Family Medicine

## 2014-05-10 VITALS — BP 140/70 | HR 71 | Temp 98.4°F | Ht 71.0 in | Wt 242.5 lb

## 2014-05-10 DIAGNOSIS — Z5181 Encounter for therapeutic drug level monitoring: Secondary | ICD-10-CM | POA: Diagnosis not present

## 2014-05-10 DIAGNOSIS — R21 Rash and other nonspecific skin eruption: Secondary | ICD-10-CM

## 2014-05-10 DIAGNOSIS — Z7189 Other specified counseling: Secondary | ICD-10-CM

## 2014-05-10 DIAGNOSIS — E119 Type 2 diabetes mellitus without complications: Secondary | ICD-10-CM

## 2014-05-10 DIAGNOSIS — H9190 Unspecified hearing loss, unspecified ear: Secondary | ICD-10-CM

## 2014-05-10 DIAGNOSIS — J0110 Acute frontal sinusitis, unspecified: Secondary | ICD-10-CM | POA: Diagnosis not present

## 2014-05-10 DIAGNOSIS — Z0000 Encounter for general adult medical examination without abnormal findings: Secondary | ICD-10-CM | POA: Diagnosis not present

## 2014-05-10 LAB — POCT INR: INR: 1.8

## 2014-05-10 MED ORDER — TRIAMCINOLONE ACETONIDE 0.5 % EX CREA: 1.0000 "application " | TOPICAL_CREAM | Freq: Two times a day (BID) | CUTANEOUS | Status: DC | PRN

## 2014-05-10 NOTE — Progress Notes (Signed)
Pre visit review using our clinic review tool, if applicable. No additional management support is needed unless otherwise documented below in the visit note.  I have personally reviewed the Medicare Annual Wellness questionnaire and have noted 1. The patient's medical and social history 2. Their use of alcohol, tobacco or illicit drugs 3. Their current medications and supplements 4. The patient's functional ability including ADL's, fall risks, home safety risks and hearing or visual             impairment. 5. Diet and physical activities 6. Evidence for depression or mood disorders  The patients weight, height, BMI have been recorded in the chart and visual acuity is per eye clinic.  I have made referrals, counseling and provided education to the patient based review of the above and I have provided the pt with a written personalized care plan for preventive services.  Provider list updated- see scanned forms.  Routine anticipatory guidance given to patient.  See health maintenance.  Flu 2015 Shingles d/w pt.  PNA 2011 Tetanus 2015 Colonoscopy 2013 Prostate cancer screening declined. Prostate cancer screening and PSA options (with potential risks and benefits of testing vs not testing) were discussed along with recent recs/guidelines.  He declined testing PSA at this point. Advance directive- wife designated if patient were incapacitated.  Cognitive function addressed- see scanned forms- and if abnormal then additional documentation follows.   Prev sinus pain much improved, he is going to finish abx soon, feels much better.  Due for f/u INR today, see notes on lab.   Itchy rash on arm. Some better but not resolve with TAC 0.1% cream.  No other new lesions.  D/w pt.    He has f/u with cards pending.    DM2.  Sugar usually 130-150 at home.  A1c improved, d/w pt.  No foot sx. No high or low sugars on home checks.   PMH and SH reviewed  Meds, vitals, and allergies reviewed.   ROS:  See HPI.  Otherwise negative.    GEN: nad, alert and oriented HEENT: mucous membranes moist NECK: supple w/o LA CV: rrr. Click noted from valve replacement.   PULM: ctab, no inc wob ABD: soft, +bs EXT: no edema SKIN: no acute rash but chronic patch about 3cm across on L forearm noted, looks to have chronically dry flaky skin w/o ulceration

## 2014-05-10 NOTE — Patient Instructions (Addendum)
Go to the lab on the way out.  We'll contact you with your lab report. Take 2mg  of coumadin today in the meantime- we'll adjust your dose when I see your INR.  Rosaria Ferries will call about your referral. Call cardiology about a follow up appointment.  Check with your insurance to see if they will cover the shingles shot. Use the higher dose of triamcinolone in the meantime and let me know if the rash doesn't get better.  Recheck A1c before a visit in about 6 months.

## 2014-05-11 ENCOUNTER — Telehealth: Payer: Self-pay

## 2014-05-11 NOTE — Telephone Encounter (Signed)
Spoke to patient's wife and advised her that we have not had to do this in the past for a shingles vaccine. Advised patient's wife to contact the insurance company and let us know what we are suppose to do to get this approved. Dr. Damita Dunnings said that if they will send him paperwork he will sign it. Patient's wife agreed that she will call [employer] back and see exactly what they need and have them contact Dr. Damita Dunnings. Spoke to [place] and was advised that she has had to get a PA a couple of times for the shingles vaccine.

## 2014-05-11 NOTE — Telephone Encounter (Signed)
Called patient's wife back and was advised that she spoke again to the insurance company and was advised that the only time you have to get a PA is if the patient is under 48. Patient's wife stated that they will check and find out if it is cheaper to get the vaccine at a pharmacy instead of the doctor's office and will call back and let Dr. Damita Dunnings know if they need a script sent to the pharmacy or schedule a nurse visit.

## 2014-05-11 NOTE — Telephone Encounter (Signed)
[name] [name] left v/m requesting prior auth for shingles vaccine sent to [facility]. [name] Niccoli spoke with [facility] and shingles vaccine needs PA prior to getting immunization.Please advise.

## 2014-05-12 ENCOUNTER — Telehealth: Payer: Self-pay | Admitting: Family Medicine

## 2014-05-12 DIAGNOSIS — L989 Disorder of the skin and subcutaneous tissue, unspecified: Secondary | ICD-10-CM | POA: Insufficient documentation

## 2014-05-12 DIAGNOSIS — R21 Rash and other nonspecific skin eruption: Secondary | ICD-10-CM | POA: Insufficient documentation

## 2014-05-12 DIAGNOSIS — Z7189 Other specified counseling: Secondary | ICD-10-CM | POA: Insufficient documentation

## 2014-05-12 NOTE — Assessment & Plan Note (Signed)
He can try TAC 0.5% and update me as needed.  Routine cautions given.  He agrees.

## 2014-05-12 NOTE — Telephone Encounter (Signed)
Noted, thanks!

## 2014-05-12 NOTE — Assessment & Plan Note (Signed)
Much improved, see notes on INR.

## 2014-05-12 NOTE — Telephone Encounter (Signed)
Patient's wife called and said patient wants to get his shingles shot at [facility].  Please send prescription to [facility] [facility].  Please call patient's wife when [facility].

## 2014-05-12 NOTE — Assessment & Plan Note (Signed)
A1c improved, lipids are reasonable, continue as is.  D/w pt.  He agrees.

## 2014-05-12 NOTE — Assessment & Plan Note (Signed)
Flu 2015 Shingles d/w pt.  PNA 2011 Tetanus 2015 Colonoscopy 2013 Prostate cancer screening declined. Prostate cancer screening and PSA options (with potential risks and benefits of testing vs not testing) were discussed along with recent recs/guidelines.  He declined testing PSA at this point. Advance directive- wife designated if patient were incapacitated.  Cognitive function addressed- see scanned forms- and if abnormal then additional documentation follows.

## 2014-05-13 MED ORDER — ZOSTER VACCINE LIVE 19400 UNT/0.65ML ~~LOC~~ SOLR: 0.6500 mL | Freq: Once | SUBCUTANEOUS | Status: DC

## 2014-05-13 NOTE — Telephone Encounter (Signed)
Sent. Thanks.   

## 2014-05-13 NOTE — Telephone Encounter (Signed)
Left a voicemail for patient.

## 2014-05-13 NOTE — Addendum Note (Signed)
Addended by: Tonia Ghent on: 05/13/2014 08:14 AM   Modules accepted: Orders

## 2014-05-19 ENCOUNTER — Encounter: Payer: Self-pay | Admitting: Family Medicine

## 2014-05-30 ENCOUNTER — Other Ambulatory Visit: Payer: Medicare Other

## 2014-05-30 ENCOUNTER — Other Ambulatory Visit (INDEPENDENT_AMBULATORY_CARE_PROVIDER_SITE_OTHER): Payer: Medicare Other

## 2014-05-30 DIAGNOSIS — Z5181 Encounter for therapeutic drug level monitoring: Secondary | ICD-10-CM | POA: Diagnosis not present

## 2014-05-30 LAB — POCT INR: INR: 3.5

## 2014-05-31 ENCOUNTER — Other Ambulatory Visit: Payer: Self-pay | Admitting: *Deleted

## 2014-05-31 MED ORDER — CARVEDILOL 6.25 MG PO TABS: 6.2500 mg | ORAL_TABLET | Freq: Two times a day (BID) | ORAL | Status: DC

## 2014-06-02 LAB — HM DIABETES EYE EXAM

## 2014-06-09 ENCOUNTER — Other Ambulatory Visit (INDEPENDENT_AMBULATORY_CARE_PROVIDER_SITE_OTHER): Payer: Medicare Other

## 2014-06-09 DIAGNOSIS — Z5181 Encounter for therapeutic drug level monitoring: Secondary | ICD-10-CM

## 2014-06-09 LAB — POCT INR: INR: 2.3

## 2014-06-10 ENCOUNTER — Other Ambulatory Visit: Payer: Self-pay

## 2014-06-10 MED ORDER — CARVEDILOL 6.25 MG PO TABS: 6.2500 mg | ORAL_TABLET | Freq: Two times a day (BID) | ORAL | Status: DC

## 2014-06-20 ENCOUNTER — Encounter: Payer: Self-pay | Admitting: Cardiology

## 2014-06-20 ENCOUNTER — Ambulatory Visit (INDEPENDENT_AMBULATORY_CARE_PROVIDER_SITE_OTHER): Payer: Medicare Other | Admitting: Cardiology

## 2014-06-20 VITALS — BP 150/82 | HR 50 | Ht 71.0 in | Wt 245.0 lb

## 2014-06-20 DIAGNOSIS — Z954 Presence of other heart-valve replacement: Secondary | ICD-10-CM

## 2014-06-20 DIAGNOSIS — I4891 Unspecified atrial fibrillation: Secondary | ICD-10-CM | POA: Diagnosis not present

## 2014-06-20 DIAGNOSIS — Z952 Presence of prosthetic heart valve: Secondary | ICD-10-CM | POA: Insufficient documentation

## 2014-06-20 NOTE — Patient Instructions (Signed)
Your physician recommends that you schedule a follow-up appointment in: 18 months with Dr. Hochrein  

## 2014-06-20 NOTE — Progress Notes (Signed)
HPI The patient presents for followup after aortic valve replacement. Since I last saw him he has been doing OK.  At the last visit he did have a mildly reduced ejection fraction. However, he's had no symptoms. Actually feels well. He denies any chest pressure, neck or arm discomfort. He's not had any palpitations, presyncope or syncope. He's had no new shortness of breath, PND or orthopnea. He's had no weight gain or edema. He does some household chores but doesn't exercise routinely.    Allergies  Allergen Reactions  . Ace Inhibitors     REACTION: cough  . Primaxin [Imipenem W/Cilastatin Sodium]     rash    Current Outpatient Prescriptions  Medication Sig Dispense Refill  . ACCU-CHEK FASTCLIX LANCETS MISC USE AS DIRECTED 102 each 3  . amoxicillin (AMOXIL) 250 MG capsule Take 4 capsules (1,000 mg total) by mouth once. Take 4 capsules by mouth one hour prior to dental work. 4 capsule 2  . benzonatate (TESSALON) 200 MG capsule Take 1 capsule (200 mg total) by mouth 3 (three) times daily as needed for cough. 30 capsule 1  . Blood Glucose Monitoring Suppl (ACCU-CHEK AVIVA PLUS) W/DEVICE KIT 1 kit by Does not apply route once. 1 kit 0  . carvedilol (COREG) 6.25 MG tablet Take 1 tablet (6.25 mg total) by mouth 2 (two) times daily. 60 tablet 1  . diphenhydrAMINE (BENADRYL) 25 MG tablet Take 25 mg by mouth every 6 (six) hours as needed.      . docusate sodium (COLACE) 100 MG capsule Take 100 mg by mouth every other day.     [name] doxycycline (VIBRA-TABS) 100 MG tablet Take 1 tablet (100 mg total) by mouth 2 (two) times daily. 20 tablet 0  . fluticasone (FLONASE) 50 MCG/ACT nasal spray USE 2 SPRAYS IN EACH NOSTRIL ONCE A DAY AS NEEDED FOR CONGESTION 16 g 11  . furosemide (LASIX) 20 MG tablet Take 1 tablet (20 mg total) by mouth daily as needed. For feet swelling 30 tablet 12  . glucose blood (ACCU-CHEK AVIVA PLUS) test strip USE AS DIRECTED TO CHECK BLOOD SUGAR 2 TO 4 TIMES A DAY.  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 100 each 3  . losartan (COZAAR) 50 MG tablet TAKE 1 TABLET BY MOUTH ONCE A DAY 90 tablet 3  . metFORMIN (GLUCOPHAGE) 500 MG tablet Take 1 tablet (500 mg total) by mouth 3 (three) times daily.    . potassium chloride SA (K-DUR,KLOR-CON) 20 MEQ tablet Take 1 tablet (20 mEq total) by mouth daily. 30 tablet 6  . triamcinolone cream (KENALOG) 0.5 % Apply 1 application topically 2 (two) times daily as needed. 30 g 1  . warfarin (COUMADIN) 1 MG tablet TAKE AS DIRECTED BY COUMADIN CLINIC 30 tablet 12  . warfarin (COUMADIN) 3 MG tablet Take as directed by anticoagulation clinic 30 tablet 3  . zoster vaccine live, PF, (ZOSTAVAX) 16553 UNT/0.65ML injection Inject 19,400 Units into the skin once. 1 each 0   No current facility-administered medications for this visit.    Past Medical History  Diagnosis Date  . Aortic insufficiency     with bicuspid aortic valve (the last MRI demonstrated the aortic root to be 4.8 x 4.7 cm.) Mod regurgitation. Normal chamber size with very mild left ventricular dysfunction.)  . Dyslipidemia   . Duodenal ulcer     with GI bleeding  . Hypertension   . Pericardial effusion 07/2009    admission to [facility], s/p pericardial  window  . PNA (pneumonia) 10/2009    admnission to [facility], with right pleural effusion, s/p thoracentesis  . 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 Syncope 08/2009    admission for fever and syncope, no source for fever seen on cultures. Syncope thought to be r/t orthostasis  . Pleural effusion 10/2009    s/p thoracentesis  . Cough 06/28/2009    Dr. Melvyn Novas  . GERD (gastroesophageal reflux disease) 03/23/2001    egd w/ dilation...Marland KitchenMarland KitchenDeatra Ina  . Acute pancreatitis     with pancreatic necrosis  . CHF (congestive heart failure)   . Orthostasis 08/2009    Fever and syncope, no source for fever on cultures.  . Pneumonia 2011    "twice"  . Anemia   . Blood transfusion   . Hemorrhoids   .  GI bleed     Gastritis, ? Chem, Working for [employer]  . Diabetes mellitus without complication     Past Surgical History  Procedure Laterality Date  . Esophagogastroduodenoscopy  1990, 1999    Hot dog in throat (1987)/ Chicken in throat (1990)/ 01/06/97 (Dr. Earlean Shawl) Impaction, dilatation, Schatzki's Ring/ 07/1998 repeat Dilatation/ 01/10/01 stricture/dilated/ 03/08/05 Schatzki's Ring, dilated/ 06/01/08 Food Disimpaction [name] Bulb Ulcer (Dr. Watt Climes)  . Esophagogastroduodenoscopy  01/06/1997    (Dr. Earlean Shawl) Impaction, dilatation, Schatzki's Ring  . Valve replacement  06/2009    St. Jude mechanical valve per Dr. Cyndia Bent  . Doppler echocardiography  01/18/97    LVH, mild dilated aorta - root mod to severe aortic regurg EF 50-55%  . Esophagogastroduodenoscopy  08/09/98    Repeat dilatation Deatra Ina)  . Esophagogastroduodenoscopy  01/13/2001    stricture/ HH, duodenal ulcer / bleeding Henrene Pastor)  . Esophagogastroduodenoscopy  12/25/01    esophagus stricture, dilated  . Esophagogastroduodenoscopy  03/23/2001    stricture, dilated  . Cardiolite ekg  03/10/2003    (-) ? small/apical infarct EF 50%  . Mri  2/05    LVH global hypokin EF 48% Mod A.I.  . Doppler echocardiography  09/27/03    No changes, moderate severe aortic regurg  . Doppler echocardiography  07/09/2004    Mod severe aortic regurg 2-3+ T.R., T.I.R., mild P..R.  . Esophagogastroduodenoscopy  03/08/05    Schatzki's Ring   03/13/05  Schatzki's Ring - dilated, duodenitis  . Doppler echocardiography  04/01/06    Hypokin Apex EF 55%  . Esophagogastroduodenoscopy  06/11/08    with food disimpaction.  Food impaction Ms [place]  Sm bulb ulcer (Dr. Watt Climes)  . Doppler echocardiography  02/15/2010    Mild LVH, mild decr Sys fctn EF 40-45% Mild Dias Dysfctn Mech AV Triv AR, MR  . Esophagogastroduodenoscopy      stricture/HH, duodenal ulcer/bleeding Henrene Pastor)  . Cardiac valve replacement  07/10/2009    AVR  . Hernia repair  2011    abdominal; post AVR     ROS:  As stated in the HPI and negative for all other systems.  PHYSICAL EXAM Ht _0  (1.803 m)  Wt 245 lb (111.131 kg)  BMI 34.19 kg/m2 GENERAL:  Well appearing HEENT:  Pupils equal round and reactive, fundi not visualized, oral mucosa unremarkable NECK:  No jugular venous distention, waveform within normal limits, carotid upstroke brisk and symmetric, no bruits, no thyromegaly LYMPHATICS:  No cervical, inguinal adenopathy LUNGS:  Clear to auscultation bilaterally, right basilar crackles BACK:  No CVA tenderness CHEST:  Well healed sternotomy scar, percutaneous drain in place. HEART:  PMI not displaced or sustained,S1 within normal limits, mechanical S2 no S3, no S4, no clicks, no rubs, no murmurs ABD:  Flat, positive bowel sounds normal in frequency in pitch, no bruits, no rebound, no guarding, no midline pulsatile mass, no hepatomegaly, no splenomegaly, obese EXT:  2 plus pulses throughout, trace edema, no cyanosis no clubbing, mild chronic venous stasis changes SKIN:  No rashes no nodules NEURO:  Cranial nerves II through XII grossly intact, motor grossly intact throughout PSYCH:  Cognitively intact, oriented to person place and time  EKG:  Sinus rhythm, rate 60 to, left axis deviation, lateral infarct, lateral T-wave inversions.  Lateral Q waves in T-wave inversion are unchanged from previous. 06/20/2014  ASSESSMENT AND PLAN  AVR:  He understands endocarditis prophylaxis. He tolerates his anticoagulation.  No change in therapy is indicated.  CARDIOMYOPATHY:  I will assess this with the echocardiogram as his ejection fraction was slightly lower when it was checked most recently.  OBESITY: The patient understands the need to lose weight with diet and exercise. We have discussed specific strategies for this.  He understands that his blood pressure control and diabetes control are contingent on this.  ABNORMAL EKG:  His EKG is unchanged from previous. I will assessment the echo  above.  HTN:  His blood pressure is mildly elevated. He was given instructions on keeping a blood pressure diary and I would be happy to review this.

## 2014-06-30 ENCOUNTER — Encounter: Payer: Self-pay | Admitting: Family Medicine

## 2014-06-30 ENCOUNTER — Other Ambulatory Visit (INDEPENDENT_AMBULATORY_CARE_PROVIDER_SITE_OTHER): Payer: Medicare Other

## 2014-06-30 DIAGNOSIS — Z5181 Encounter for therapeutic drug level monitoring: Secondary | ICD-10-CM

## 2014-06-30 LAB — POCT INR: INR: 3

## 2014-07-11 ENCOUNTER — Other Ambulatory Visit: Payer: Medicare Other

## 2014-07-12 ENCOUNTER — Encounter: Payer: Self-pay | Admitting: *Deleted

## 2014-07-18 ENCOUNTER — Telehealth: Payer: Self-pay | Admitting: *Deleted

## 2014-07-18 MED ORDER — WARFARIN SODIUM 3 MG PO TABS: ORAL_TABLET | ORAL | Status: DC

## 2014-07-18 MED ORDER — WARFARIN SODIUM 1 MG PO TABS: ORAL_TABLET | ORAL | Status: DC

## 2014-07-18 NOTE — Telephone Encounter (Signed)
Does he need 3mg  or 1mg  refill?  Thanks.

## 2014-07-18 NOTE — Telephone Encounter (Signed)
Called patient to verify dosage: 4 mg daily except 2 mg on Wednesday and Sunday.  Appropriate refill sent to [facility].  Patient is scheduled for recheck at [facility] on 07/28/14.

## 2014-07-18 NOTE — Addendum Note (Signed)
Addended by: Amherst Lions on: 07/18/2014 03:26 PM   Modules accepted: Orders

## 2014-07-18 NOTE — Telephone Encounter (Signed)
Noted, thanks!

## 2014-07-18 NOTE — Telephone Encounter (Signed)
SENT TO [facility] FOR REFILL

## 2014-07-28 ENCOUNTER — Other Ambulatory Visit: Payer: Medicare Other

## 2014-07-28 ENCOUNTER — Telehealth: Payer: Self-pay | Admitting: Family Medicine

## 2014-07-28 ENCOUNTER — Ambulatory Visit: Payer: Medicare Other

## 2014-07-28 NOTE — Telephone Encounter (Signed)
Patient did not come in for their appointment today for inr check.  Please let me know if patient needs to be contacted immediately for follow up or no follow up needed.

## 2014-07-28 NOTE — Telephone Encounter (Signed)
Please call patient to reschedule.  Must be next week 7/7.

## 2014-08-02 ENCOUNTER — Other Ambulatory Visit: Payer: Medicare Other

## 2014-08-02 NOTE — Telephone Encounter (Signed)
Scheduled for 7/7 at 845. Pt aware

## 2014-08-04 ENCOUNTER — Ambulatory Visit (INDEPENDENT_AMBULATORY_CARE_PROVIDER_SITE_OTHER): Payer: Medicare Other | Admitting: *Deleted

## 2014-08-04 DIAGNOSIS — Z5181 Encounter for therapeutic drug level monitoring: Secondary | ICD-10-CM

## 2014-08-04 DIAGNOSIS — I4891 Unspecified atrial fibrillation: Secondary | ICD-10-CM | POA: Diagnosis not present

## 2014-08-04 LAB — POCT INR: INR: 2.6

## 2014-08-04 NOTE — Progress Notes (Signed)
Pre visit review using our clinic review tool, if applicable. No additional management support is needed unless otherwise documented below in the visit note. 

## 2014-08-09 ENCOUNTER — Other Ambulatory Visit: Payer: Self-pay | Admitting: *Deleted

## 2014-08-09 MED ORDER — CARVEDILOL 6.25 MG PO TABS: 6.2500 mg | ORAL_TABLET | Freq: Two times a day (BID) | ORAL | Status: DC

## 2014-09-01 ENCOUNTER — Ambulatory Visit (INDEPENDENT_AMBULATORY_CARE_PROVIDER_SITE_OTHER): Payer: Medicare Other | Admitting: *Deleted

## 2014-09-01 DIAGNOSIS — I4891 Unspecified atrial fibrillation: Secondary | ICD-10-CM | POA: Diagnosis not present

## 2014-09-01 DIAGNOSIS — Z5181 Encounter for therapeutic drug level monitoring: Secondary | ICD-10-CM | POA: Diagnosis not present

## 2014-09-01 LAB — POCT INR: INR: 2.6

## 2014-09-01 NOTE — Progress Notes (Signed)
Pre visit review using our clinic review tool, if applicable. No additional management support is needed unless otherwise documented below in the visit note. 

## 2014-09-13 ENCOUNTER — Other Ambulatory Visit: Payer: Self-pay | Admitting: Family Medicine

## 2014-09-29 ENCOUNTER — Ambulatory Visit (INDEPENDENT_AMBULATORY_CARE_PROVIDER_SITE_OTHER): Payer: Medicare Other | Admitting: *Deleted

## 2014-09-29 DIAGNOSIS — Z5181 Encounter for therapeutic drug level monitoring: Secondary | ICD-10-CM

## 2014-09-29 DIAGNOSIS — I4891 Unspecified atrial fibrillation: Secondary | ICD-10-CM

## 2014-09-29 LAB — POCT INR: INR: 2.8

## 2014-09-29 NOTE — Progress Notes (Signed)
Pre visit review using our clinic review tool, if applicable. No additional management support is needed unless otherwise documented below in the visit note. 

## 2014-10-12 ENCOUNTER — Other Ambulatory Visit: Payer: Self-pay | Admitting: *Deleted

## 2014-10-12 MED ORDER — FUROSEMIDE 20 MG PO TABS: 20.0000 mg | ORAL_TABLET | Freq: Every day | ORAL | Status: DC | PRN

## 2014-10-27 ENCOUNTER — Telehealth: Payer: Self-pay | Admitting: *Deleted

## 2014-10-27 ENCOUNTER — Ambulatory Visit (INDEPENDENT_AMBULATORY_CARE_PROVIDER_SITE_OTHER): Payer: Medicare Other | Admitting: *Deleted

## 2014-10-27 ENCOUNTER — Ambulatory Visit: Payer: Medicare Other

## 2014-10-27 DIAGNOSIS — Z5181 Encounter for therapeutic drug level monitoring: Secondary | ICD-10-CM

## 2014-10-27 DIAGNOSIS — I4891 Unspecified atrial fibrillation: Secondary | ICD-10-CM

## 2014-10-27 LAB — POCT INR: INR: 2.1

## 2014-10-27 NOTE — Progress Notes (Signed)
Pre visit review using our clinic review tool, if applicable. No additional management support is needed unless otherwise documented below in the visit note. 

## 2014-10-27 NOTE — Telephone Encounter (Signed)
No show for Coumadin clinic appointment today 9/29 at 8:30a.  Left message for patient to return call.

## 2014-10-31 NOTE — Telephone Encounter (Signed)
Patient called to reschedule for same day.

## 2014-11-05 ENCOUNTER — Other Ambulatory Visit: Payer: Self-pay | Admitting: Family Medicine

## 2014-11-05 DIAGNOSIS — E119 Type 2 diabetes mellitus without complications: Secondary | ICD-10-CM

## 2014-11-09 ENCOUNTER — Other Ambulatory Visit (INDEPENDENT_AMBULATORY_CARE_PROVIDER_SITE_OTHER): Payer: Medicare Other

## 2014-11-09 DIAGNOSIS — E119 Type 2 diabetes mellitus without complications: Secondary | ICD-10-CM | POA: Diagnosis not present

## 2014-11-09 LAB — HEMOGLOBIN A1C: HEMOGLOBIN A1C: 8.9 % — AB (ref 4.6–6.5)

## 2014-11-16 ENCOUNTER — Encounter: Payer: Self-pay | Admitting: Family Medicine

## 2014-11-16 ENCOUNTER — Ambulatory Visit (INDEPENDENT_AMBULATORY_CARE_PROVIDER_SITE_OTHER): Payer: Medicare Other | Admitting: Family Medicine

## 2014-11-16 VITALS — BP 120/62 | HR 63 | Temp 98.3°F | Wt 242.5 lb

## 2014-11-16 DIAGNOSIS — Z23 Encounter for immunization: Secondary | ICD-10-CM | POA: Diagnosis not present

## 2014-11-16 DIAGNOSIS — Z119 Encounter for screening for infectious and parasitic diseases, unspecified: Secondary | ICD-10-CM | POA: Diagnosis not present

## 2014-11-16 DIAGNOSIS — E119 Type 2 diabetes mellitus without complications: Secondary | ICD-10-CM

## 2014-11-16 MED ORDER — POTASSIUM CHLORIDE ER 10 MEQ PO TBCR: 10.0000 meq | EXTENDED_RELEASE_TABLET | Freq: Two times a day (BID) | ORAL | Status: DC

## 2014-11-16 MED ORDER — METFORMIN HCL 500 MG PO TABS: ORAL_TABLET | ORAL | Status: DC

## 2014-11-16 NOTE — Assessment & Plan Note (Signed)
Taking 1 metformin BID prev, recently up to 2 BID (in the last week).  Sugar has improved in the meantime, down to 160 this AM.   A1c d/w pt, elevated from prev Diet and exercise d/w pt.  He has been working on both.   Recheck A1c in about 50months, he can update me with his sugar reading in the meantime if needed.  Is clearly improve on the 1000mg  metformin BID recently.

## 2014-11-16 NOTE — Progress Notes (Signed)
Pre visit review using our clinic review tool, if applicable. No additional management support is needed unless otherwise documented below in the visit note.  He had trouble swallowing his 62mEq K dur and I sent rx for 22mEq per his request.    Diabetes:  Using medications without difficulties: yes Hypoglycemic episodes: no Hyperglycemic episodes: yes, in the last month, up to 300 Feet problems:no Blood Sugars averaging: see below.   eye exam within last year: yes Taking 1 metformin BID prev, recently up to 2 BID (in the last week).  Sugar has improved in the meantime, down to 160 this AM.   A1c d/w pt.   Diet and exercise d/w pt.  He has been working on both.    Pt opts in for HIV screening.  D/w pt re: routine screening.    Meds, vitals, and allergies reviewed.   ROS: See HPI.  Otherwise negative.    GEN: nad, alert and oriented HEENT: mucous membranes moist NECK: supple w/o LA CV: rrr. Click noted as expected after valve replacement.  PULM: ctab, no inc wob ABD: soft, +bs EXT: trace (almost no) edema SKIN: no acute rash  Diabetic foot exam: Normal inspection No skin breakdown No calluses  Normal DP pulses Normal sensation to light touch and monofilament Nails normal

## 2014-11-16 NOTE — Patient Instructions (Addendum)
Recheck A1c in about 3 months before a visit.   Update me in about 2 weeks or as needed in the meantime.   Keep working on [employer] and exercise.   Take care.  Glad to see you.   If you have trouble on the higher dose of metformin, then let me know.

## 2014-11-24 ENCOUNTER — Ambulatory Visit: Payer: Medicare Other

## 2014-12-01 ENCOUNTER — Ambulatory Visit (INDEPENDENT_AMBULATORY_CARE_PROVIDER_SITE_OTHER): Payer: Medicare Other | Admitting: *Deleted

## 2014-12-01 DIAGNOSIS — Z5181 Encounter for therapeutic drug level monitoring: Secondary | ICD-10-CM

## 2014-12-01 DIAGNOSIS — I4891 Unspecified atrial fibrillation: Secondary | ICD-10-CM | POA: Diagnosis not present

## 2014-12-01 LAB — POCT INR: INR: 2.3

## 2014-12-01 NOTE — Progress Notes (Signed)
Pre visit review using our clinic review tool, if applicable. No additional management support is needed unless otherwise documented below in the visit note. 

## 2014-12-15 ENCOUNTER — Other Ambulatory Visit: Payer: Self-pay | Admitting: *Deleted

## 2014-12-15 NOTE — Telephone Encounter (Signed)
Faxed refill request. Last Filled:    45 tablet 3 07/18/2014  Please advise.

## 2014-12-16 MED ORDER — WARFARIN SODIUM 1 MG PO TABS: ORAL_TABLET | ORAL | Status: DC

## 2014-12-29 ENCOUNTER — Ambulatory Visit (INDEPENDENT_AMBULATORY_CARE_PROVIDER_SITE_OTHER): Payer: Medicare Other | Admitting: *Deleted

## 2014-12-29 ENCOUNTER — Other Ambulatory Visit: Payer: Self-pay | Admitting: *Deleted

## 2014-12-29 DIAGNOSIS — Z5181 Encounter for therapeutic drug level monitoring: Secondary | ICD-10-CM | POA: Diagnosis not present

## 2014-12-29 DIAGNOSIS — I4891 Unspecified atrial fibrillation: Secondary | ICD-10-CM

## 2014-12-29 LAB — POCT INR: INR: 2.6

## 2014-12-29 MED ORDER — GLUCOSE BLOOD VI STRP: ORAL_STRIP | Status: DC

## 2014-12-29 NOTE — Progress Notes (Signed)
Pre visit review using our clinic review tool, if applicable. No additional management support is needed unless otherwise documented below in the visit note. 

## 2015-01-05 ENCOUNTER — Other Ambulatory Visit: Payer: Self-pay | Admitting: *Deleted

## 2015-01-05 NOTE — Telephone Encounter (Signed)
Faxed refill request. Last Filled:    30 tablet 3 07/18/2014  Please advise.

## 2015-01-06 MED ORDER — WARFARIN SODIUM 3 MG PO TABS: ORAL_TABLET | ORAL | Status: DC

## 2015-02-13 ENCOUNTER — Ambulatory Visit (INDEPENDENT_AMBULATORY_CARE_PROVIDER_SITE_OTHER): Payer: Medicare Other | Admitting: *Deleted

## 2015-02-13 ENCOUNTER — Other Ambulatory Visit (INDEPENDENT_AMBULATORY_CARE_PROVIDER_SITE_OTHER): Payer: Medicare Other

## 2015-02-13 DIAGNOSIS — Z5181 Encounter for therapeutic drug level monitoring: Secondary | ICD-10-CM

## 2015-02-13 DIAGNOSIS — I4891 Unspecified atrial fibrillation: Secondary | ICD-10-CM | POA: Diagnosis not present

## 2015-02-13 DIAGNOSIS — Z119 Encounter for screening for infectious and parasitic diseases, unspecified: Secondary | ICD-10-CM

## 2015-02-13 DIAGNOSIS — E119 Type 2 diabetes mellitus without complications: Secondary | ICD-10-CM

## 2015-02-13 LAB — POCT INR: INR: 2.6

## 2015-02-13 LAB — HEMOGLOBIN A1C: Hgb A1c MFr Bld: 8 % — ABNORMAL HIGH (ref 4.6–6.5)

## 2015-02-13 NOTE — Progress Notes (Signed)
Pre visit review using our clinic review tool, if applicable. No additional management support is needed unless otherwise documented below in the visit note. 

## 2015-02-14 LAB — HIV ANTIBODY (ROUTINE TESTING W REFLEX): HIV 1&2 Ab, 4th Generation: NONREACTIVE

## 2015-02-17 ENCOUNTER — Encounter: Payer: Self-pay | Admitting: Family Medicine

## 2015-02-17 ENCOUNTER — Ambulatory Visit (INDEPENDENT_AMBULATORY_CARE_PROVIDER_SITE_OTHER): Payer: Medicare Other | Admitting: Family Medicine

## 2015-02-17 VITALS — BP 142/70 | HR 62 | Temp 97.8°F | Wt 248.2 lb

## 2015-02-17 DIAGNOSIS — E119 Type 2 diabetes mellitus without complications: Secondary | ICD-10-CM

## 2015-02-17 NOTE — Patient Instructions (Signed)
Don't change your meds.  Keep working on [employer].  Use your exercise bike.   Recheck labs before a physical in about 3 months.  Take care.  Glad to see you.

## 2015-02-17 NOTE — Assessment & Plan Note (Signed)
Not controlled but improved.  He'll work on diet and exercise.  No change in meds. He may get some A1c improvement w/o med change. He agrees.  Recheck in about 3 months.   Incidentally, he'll update me if his hand/finger sx get worse.

## 2015-02-17 NOTE — Progress Notes (Signed)
Pre visit review using our clinic review tool, if applicable. No additional management support is needed unless otherwise documented below in the visit note.]  Diabetes:  Using medications without difficulties: yes Hypoglycemic episodes: no Hyperglycemic episodes: no Feet problems: no Blood Sugars averaging: ~150 in the AMs, occ higher.   eye exam within last year: yes A1c improved to 8.   Compliant with meds.   He can do better with diet and exercise.    He has noted some L 3rd DIP quick onset pain w/o stiffness.  No trauma.  Pain self resolves.  Normal ROM.   Meds, vitals, and allergies reviewed.   ROS: See HPI.  Otherwise negative.    GEN: nad, alert and oriented NECK: supple w/o LA CV: rrr. Valve click noted at baseline.  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   Diabetic foot exam: Normal inspection No skin breakdown No calluses  Normal DP pulses Normal sensation to light touch and monofilament Nails normal

## 2015-03-16 ENCOUNTER — Other Ambulatory Visit: Payer: Self-pay | Admitting: *Deleted

## 2015-03-16 NOTE — Telephone Encounter (Signed)
Faxed refill request. Last Filled:    120 tablet 0 11/18/  Last office visit:   02/17/2015  Please advise.

## 2015-03-17 MED ORDER — WARFARIN SODIUM 1 MG PO TABS: ORAL_TABLET | ORAL | Status: DC

## 2015-03-27 ENCOUNTER — Ambulatory Visit (INDEPENDENT_AMBULATORY_CARE_PROVIDER_SITE_OTHER): Payer: Medicare Other | Admitting: *Deleted

## 2015-03-27 DIAGNOSIS — Z5181 Encounter for therapeutic drug level monitoring: Secondary | ICD-10-CM

## 2015-03-27 DIAGNOSIS — I4891 Unspecified atrial fibrillation: Secondary | ICD-10-CM | POA: Diagnosis not present

## 2015-03-27 LAB — POCT INR: INR: 2.6

## 2015-03-27 NOTE — Progress Notes (Signed)
Pre visit review using our clinic review tool, if applicable. No additional management support is needed unless otherwise documented below in the visit note. 

## 2015-04-03 ENCOUNTER — Other Ambulatory Visit: Payer: Self-pay | Admitting: *Deleted

## 2015-04-03 MED ORDER — ACCU-CHEK FASTCLIX LANCETS MISC: Status: DC

## 2015-04-03 NOTE — Telephone Encounter (Signed)
Refill sent electronically to pharmacy. 

## 2015-05-01 ENCOUNTER — Other Ambulatory Visit: Payer: Self-pay | Admitting: *Deleted

## 2015-05-01 MED ORDER — WARFARIN SODIUM 3 MG PO TABS: ORAL_TABLET | ORAL | Status: DC

## 2015-05-07 ENCOUNTER — Other Ambulatory Visit: Payer: Self-pay | Admitting: Family Medicine

## 2015-05-07 DIAGNOSIS — E119 Type 2 diabetes mellitus without complications: Secondary | ICD-10-CM

## 2015-05-07 DIAGNOSIS — I4891 Unspecified atrial fibrillation: Secondary | ICD-10-CM

## 2015-05-11 ENCOUNTER — Other Ambulatory Visit (INDEPENDENT_AMBULATORY_CARE_PROVIDER_SITE_OTHER): Payer: Medicare Other

## 2015-05-11 ENCOUNTER — Ambulatory Visit (INDEPENDENT_AMBULATORY_CARE_PROVIDER_SITE_OTHER): Payer: Medicare Other | Admitting: *Deleted

## 2015-05-11 DIAGNOSIS — E119 Type 2 diabetes mellitus without complications: Secondary | ICD-10-CM | POA: Diagnosis not present

## 2015-05-11 DIAGNOSIS — Z5181 Encounter for therapeutic drug level monitoring: Secondary | ICD-10-CM

## 2015-05-11 DIAGNOSIS — I4891 Unspecified atrial fibrillation: Secondary | ICD-10-CM

## 2015-05-11 LAB — COMPREHENSIVE METABOLIC PANEL
ALT: 34 U/L (ref 0–53)
AST: 39 U/L — ABNORMAL HIGH (ref 0–37)
Albumin: 4.2 g/dL (ref 3.5–5.2)
Alkaline Phosphatase: 68 U/L (ref 39–117)
BUN: 10 mg/dL (ref 6–23)
CHLORIDE: 103 meq/L (ref 96–112)
CO2: 28 meq/L (ref 19–32)
Calcium: 9.3 mg/dL (ref 8.4–10.5)
Creatinine, Ser: 0.89 mg/dL (ref 0.40–1.50)
GFR: 91.7 mL/min (ref >60.00)
GLUCOSE: 157 mg/dL — AB (ref 70–99)
POTASSIUM: 4.3 meq/L (ref 3.5–5.1)
SODIUM: 138 meq/L (ref 135–145)
Total Bilirubin: 0.8 mg/dL (ref 0.2–1.2)
Total Protein: 7.4 g/dL (ref 6.0–8.3)

## 2015-05-11 LAB — CBC WITH DIFFERENTIAL/PLATELET
Basophils Absolute: 0 10*3/uL (ref 0.0–0.1)
Basophils Relative: 0.6 % (ref 0.0–3.0)
Eosinophils Absolute: 0.2 10*3/uL (ref 0.0–0.7)
Eosinophils Relative: 3.8 % (ref 0.0–5.0)
HCT: 42.2 % (ref 39.0–52.0)
Hemoglobin: 14.4 g/dL (ref 13.0–17.0)
LYMPHS ABS: 2.4 10*3/uL (ref 0.7–4.0)
LYMPHS PCT: 41.7 % (ref 12.0–46.0)
MCHC: 34.3 g/dL (ref 30.0–36.0)
MCV: 92.2 fl (ref 78.0–100.0)
MONO ABS: 0.6 10*3/uL (ref 0.1–1.0)
MONOS PCT: 10.1 % (ref 3.0–12.0)
NEUTROS ABS: 2.5 10*3/uL (ref 1.4–7.7)
NEUTROS PCT: 43.8 % (ref 43.0–77.0)
PLATELETS: 150 10*3/uL (ref 150.0–400.0)
RBC: 4.57 Mil/uL (ref 4.22–5.81)
RDW: 14.2 % (ref 11.5–15.5)
WBC: 5.7 10*3/uL (ref 4.0–10.5)

## 2015-05-11 LAB — LIPID PANEL
CHOLESTEROL: 194 mg/dL (ref 0–200)
HDL: 42.6 mg/dL (ref >39.00)
LDL CALC: 121 mg/dL — AB (ref 0–99)
NONHDL: 151.87
Total CHOL/HDL Ratio: 5
Triglycerides: 156 mg/dL — ABNORMAL HIGH (ref 0.0–149.0)
VLDL: 31.2 mg/dL (ref 0.0–40.0)

## 2015-05-11 LAB — HEMOGLOBIN A1C: HEMOGLOBIN A1C: 8 % — AB (ref 4.6–6.5)

## 2015-05-11 LAB — POCT INR: INR: 2.7

## 2015-05-11 NOTE — Progress Notes (Signed)
Pre visit review using our clinic review tool, if applicable. No additional management support is needed unless otherwise documented below in the visit note. 

## 2015-05-15 ENCOUNTER — Other Ambulatory Visit: Payer: Self-pay | Admitting: *Deleted

## 2015-05-15 MED ORDER — LOSARTAN POTASSIUM 50 MG PO TABS: 50.0000 mg | ORAL_TABLET | Freq: Every day | ORAL | Status: DC

## 2015-05-18 ENCOUNTER — Ambulatory Visit (INDEPENDENT_AMBULATORY_CARE_PROVIDER_SITE_OTHER): Payer: Medicare Other | Admitting: Family Medicine

## 2015-05-18 ENCOUNTER — Encounter: Payer: Self-pay | Admitting: Family Medicine

## 2015-05-18 VITALS — BP 142/82 | HR 61 | Temp 97.7°F | Ht 71.0 in | Wt 245.2 lb

## 2015-05-18 DIAGNOSIS — E119 Type 2 diabetes mellitus without complications: Secondary | ICD-10-CM | POA: Diagnosis not present

## 2015-05-18 DIAGNOSIS — I10 Essential (primary) hypertension: Secondary | ICD-10-CM | POA: Diagnosis not present

## 2015-05-18 DIAGNOSIS — Z0000 Encounter for general adult medical examination without abnormal findings: Secondary | ICD-10-CM

## 2015-05-18 MED ORDER — ACCU-CHEK FASTCLIX LANCETS MISC: Status: DC

## 2015-05-18 NOTE — Progress Notes (Signed)
Pre visit review using our clinic review tool, if applicable. No additional management support is needed unless otherwise documented below in the visit note.  I have personally reviewed the Medicare Annual Wellness questionnaire and have noted 1. The patient's medical and social history 2. Their use of alcohol, tobacco or illicit drugs 3. Their current medications and supplements 4. The patient's functional ability including ADL's, fall risks, home safety risks and hearing or visual             impairment. 5. Diet and physical activities 6. Evidence for depression or mood disorders  The patients weight, height, BMI have been recorded in the chart and visual acuity is per eye clinic.  I have made referrals, counseling and provided education to the patient based review of the above and I have provided the pt with a written personalized care plan for preventive services.  Provider list updated- see scanned forms.  Routine anticipatory guidance given to patient.  See health maintenance.  Flu up to date Shingles up to date PNA up to date Tetanus up to date Colonoscopy 2013 Prostate cancer screening and PSA options (with potential risks and benefits of testing vs not testing) were discussed along with recent recs/guidelines.  He declined testing PSA at this point. He'll consider for the future.  Advance directive- wife designated if patient were incapacitated.   Cognitive function addressed- see scanned forms- and if abnormal then additional documentation follows.   Diabetes:  Using medications without difficulties:yes, doing well with 3 metformin a day, 1500mg .  Had some lows on 2000mg  a day.  Hypoglycemic episodes: see above.  Hyperglycemic episodes:no Feet problems: no Blood Sugars averaging: usually ~150 eye exam within last year: yes, d/w pt.   A1c still 8, d/w pt.   Weight usually stable in the lows 240s.    Hypertension:    Using medication without problems or lightheadedness:  yes Chest pain with exertion:no Edema:not with daily use of lasix.  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  occ bleeding when he bumps his arms, ie working in the yard.  No other bleeding.  D/w pt.    PMH and SH reviewed.   Vital signs, Meds and allergies reviewed.  ROS: See HPI.  Otherwise nontributory.   GEN: nad, alert and oriented HEENT: mucous membranes moist NECK: supple w/o LA CV: rrr.  no murmur, typical sound from prev valve replacement noted.  PULM: ctab, no inc wob ABD: soft, +bs EXT: no edema SKIN: no acute rash  Diabetic foot exam: Normal inspection No skin breakdown No calluses  Normal DP pulses Normal sensation to light tough and monofilament Nails normal

## 2015-05-18 NOTE — Patient Instructions (Signed)
Recheck A1c in about 3-4 months.  Don't change your meds for now.  Keep working on your weight with diet and exercise.   Glad to see you.  Take care.  Update me as needed.

## 2015-05-19 NOTE — Assessment & Plan Note (Signed)
D/w pt. Recheck A1c in about 3-4 months. Don't change your meds for now. Keep working on your weight with diet and exercise.  I don't want to inc meds given his prev low sx.  He agrees.

## 2015-05-19 NOTE — Assessment & Plan Note (Signed)
Needs weight loss more than med change.  D/w pt.  Recheck in a few months.  He agrees.  Continue with current meds.

## 2015-05-19 NOTE — Assessment & Plan Note (Signed)
Flu up to date  Shingles up to date  PNA up to date  Tetanus up to date  Colonoscopy 2013  Prostate cancer screening and PSA options (with potential risks and benefits of testing vs not testing) were discussed along with recent recs/guidelines. He declined testing PSA at this point. He'll consider for the future.  Advance directive- wife designated if patient were incapacitated.  Cognitive function addressed- see scanned forms- and if abnormal then additional documentation follows.

## 2015-06-19 ENCOUNTER — Ambulatory Visit: Payer: Medicare Other

## 2015-06-22 ENCOUNTER — Ambulatory Visit (INDEPENDENT_AMBULATORY_CARE_PROVIDER_SITE_OTHER): Payer: Medicare Other | Admitting: *Deleted

## 2015-06-22 DIAGNOSIS — Z5181 Encounter for therapeutic drug level monitoring: Secondary | ICD-10-CM

## 2015-06-22 DIAGNOSIS — I4891 Unspecified atrial fibrillation: Secondary | ICD-10-CM | POA: Diagnosis not present

## 2015-06-22 LAB — POCT INR: INR: 2.6

## 2015-06-22 NOTE — Progress Notes (Signed)
Pre visit review using our clinic review tool, if applicable. No additional management support is needed unless otherwise documented below in the visit note. 

## 2015-06-27 ENCOUNTER — Other Ambulatory Visit: Payer: Self-pay | Admitting: *Deleted

## 2015-06-27 NOTE — Telephone Encounter (Signed)
Faxed refill request. Last Filled:   120 tablet 0 03/17/2015  Please advise.

## 2015-06-28 LAB — HM DIABETES EYE EXAM

## 2015-06-28 MED ORDER — WARFARIN SODIUM 1 MG PO TABS: ORAL_TABLET | ORAL | Status: DC

## 2015-07-21 ENCOUNTER — Encounter: Payer: Self-pay | Admitting: Family Medicine

## 2015-08-02 ENCOUNTER — Other Ambulatory Visit: Payer: Self-pay | Admitting: Family Medicine

## 2015-08-02 NOTE — Telephone Encounter (Signed)
Sent. Thanks.   

## 2015-08-02 NOTE — Telephone Encounter (Signed)
Received refill electronically Last office visit 05/18/15 Appointment for INR scheduled 08/03/15 Last refill 05/01/15 #30

## 2015-08-03 ENCOUNTER — Other Ambulatory Visit (INDEPENDENT_AMBULATORY_CARE_PROVIDER_SITE_OTHER): Payer: Medicare Other

## 2015-08-03 DIAGNOSIS — Z5181 Encounter for therapeutic drug level monitoring: Secondary | ICD-10-CM

## 2015-08-03 DIAGNOSIS — I4891 Unspecified atrial fibrillation: Secondary | ICD-10-CM

## 2015-08-03 LAB — POCT INR: INR: 1.6

## 2015-08-10 ENCOUNTER — Other Ambulatory Visit: Payer: Self-pay | Admitting: Family Medicine

## 2015-08-10 NOTE — Telephone Encounter (Signed)
Electronic refill request. Last Filled:    30 g 1 05/10/2014  Last office visit:   CPE 05/18/15  Please advise.

## 2015-08-11 NOTE — Telephone Encounter (Signed)
Sent. Thanks.   

## 2015-08-14 ENCOUNTER — Other Ambulatory Visit (INDEPENDENT_AMBULATORY_CARE_PROVIDER_SITE_OTHER): Payer: Medicare Other

## 2015-08-14 DIAGNOSIS — I4891 Unspecified atrial fibrillation: Secondary | ICD-10-CM

## 2015-08-14 DIAGNOSIS — Z5181 Encounter for therapeutic drug level monitoring: Secondary | ICD-10-CM

## 2015-08-14 LAB — POCT INR: INR: 1.8

## 2015-08-18 ENCOUNTER — Other Ambulatory Visit (INDEPENDENT_AMBULATORY_CARE_PROVIDER_SITE_OTHER): Payer: Medicare Other

## 2015-08-18 DIAGNOSIS — E119 Type 2 diabetes mellitus without complications: Secondary | ICD-10-CM | POA: Diagnosis not present

## 2015-08-18 LAB — HEMOGLOBIN A1C: Hgb A1c MFr Bld: 7.9 % — ABNORMAL HIGH (ref 4.6–6.5)

## 2015-08-23 ENCOUNTER — Ambulatory Visit (INDEPENDENT_AMBULATORY_CARE_PROVIDER_SITE_OTHER): Payer: Medicare Other | Admitting: Family Medicine

## 2015-08-23 ENCOUNTER — Encounter: Payer: Self-pay | Admitting: Family Medicine

## 2015-08-23 DIAGNOSIS — I4891 Unspecified atrial fibrillation: Secondary | ICD-10-CM | POA: Diagnosis not present

## 2015-08-23 DIAGNOSIS — Z5181 Encounter for therapeutic drug level monitoring: Secondary | ICD-10-CM | POA: Diagnosis not present

## 2015-08-23 DIAGNOSIS — E119 Type 2 diabetes mellitus without complications: Secondary | ICD-10-CM | POA: Diagnosis not present

## 2015-08-23 LAB — POCT INR: INR: 2.4

## 2015-08-23 NOTE — Progress Notes (Signed)
Pre visit review using our clinic review tool, if applicable. No additional management support is needed unless otherwise documented below in the visit note.  Cut his R calf of a nail in a board today.  Tetanus 2015.  D/w pt.  He this was about 4.5 hours ago.  Bled, then was able to control bleeding with pressure.  Has a bandaid on the spot now.    Diabetes:  Using medications without difficulties:yes Hypoglycemic episodes:no Hyperglycemic episodes:no Feet problems: no Blood Sugars averaging: ~150-160 usually eye exam within last year: yes A1c 7.9, first time below 8 recently.   He is working on diet.  He knows that working on diet will help A1c, weight, etc.   He was asking about getting an order for his walk in tub that he already has in his house.  He has h/o vascular disease with valve replacement, h/o DM2.  He feels better using the massage feature in the tub, with less leg aches.  I told him I could write the order and he would see if he would qualify.    Meds, vitals, and allergies reviewed.   ROS: Per HPI unless specifically indicated in ROS section   GEN: nad, alert and oriented HEENT: mucous membranes moist NECK: supple w/o LA CV: rrr. PULM: ctab, no inc wob ABD: soft, +bs EXT: no edema SKIN: no acute rash.  R calf with 2 small (<<1cm), pinhole sized point nicks in the skin.    Diabetic foot exam: Normal inspection No skin breakdown No calluses  Normal DP pulses Normal sensation to light touch and monofilament Nails normal

## 2015-08-23 NOTE — Patient Instructions (Signed)
No change in meds.   Keep working on [employer].  Go by the lab on the way out to get your INR done.  We'll contact you with your INR plan.  Recheck A1c in about 3 months before a visit.  Take care.  Glad to see you.  Update me as needed.

## 2015-08-24 NOTE — Assessment & Plan Note (Signed)
INR okay, no bleeding now.  No tx needed for small nick in the skin, d/w pt.  See notes on INR.

## 2015-08-24 NOTE — Assessment & Plan Note (Signed)
A1c 7.9, first time below 8 recently.   He is working on diet.  He knows that working on diet will help A1c, weight, etc.   Continue meds as is, work on diet and weight recheck in a few months.  He agrees.

## 2015-08-28 ENCOUNTER — Other Ambulatory Visit: Payer: Medicare Other

## 2015-09-07 ENCOUNTER — Other Ambulatory Visit: Payer: Self-pay

## 2015-09-07 MED ORDER — CARVEDILOL 6.25 MG PO TABS
6.2500 mg | ORAL_TABLET | Freq: Two times a day (BID) | ORAL | 6 refills | Status: DC
Start: 2015-09-07 — End: 2016-04-23

## 2015-09-14 ENCOUNTER — Other Ambulatory Visit: Payer: Medicare Other

## 2015-09-18 ENCOUNTER — Other Ambulatory Visit (INDEPENDENT_AMBULATORY_CARE_PROVIDER_SITE_OTHER): Payer: Medicare Other

## 2015-09-18 DIAGNOSIS — Z5181 Encounter for therapeutic drug level monitoring: Secondary | ICD-10-CM | POA: Diagnosis not present

## 2015-09-18 DIAGNOSIS — I4891 Unspecified atrial fibrillation: Secondary | ICD-10-CM

## 2015-09-18 LAB — POCT INR: INR: 2.8

## 2015-09-27 ENCOUNTER — Other Ambulatory Visit: Payer: Self-pay | Admitting: Family Medicine

## 2015-09-27 NOTE — Telephone Encounter (Signed)
Received refill electronically Last refill 06/28/15 Last office visit 08/23/15

## 2015-09-27 NOTE — Telephone Encounter (Signed)
Sent. Thanks.   

## 2015-10-16 ENCOUNTER — Other Ambulatory Visit (INDEPENDENT_AMBULATORY_CARE_PROVIDER_SITE_OTHER): Payer: Medicare Other

## 2015-10-16 DIAGNOSIS — Z5181 Encounter for therapeutic drug level monitoring: Secondary | ICD-10-CM | POA: Diagnosis not present

## 2015-10-16 DIAGNOSIS — I4891 Unspecified atrial fibrillation: Secondary | ICD-10-CM | POA: Diagnosis not present

## 2015-10-16 LAB — POCT INR: INR: 2.3

## 2015-10-19 ENCOUNTER — Other Ambulatory Visit: Payer: Self-pay | Admitting: Family Medicine

## 2015-10-19 NOTE — Telephone Encounter (Signed)
Electronic refill request. Last Filled:    30 capsule 1 05/02/2014  Please advise.

## 2015-10-19 NOTE — Telephone Encounter (Signed)
Sent. Thanks.   

## 2015-10-27 ENCOUNTER — Other Ambulatory Visit: Payer: Self-pay | Admitting: *Deleted

## 2015-10-27 MED ORDER — FUROSEMIDE 20 MG PO TABS: 20.0000 mg | ORAL_TABLET | Freq: Every day | ORAL | 0 refills | Status: DC | PRN

## 2015-11-13 ENCOUNTER — Other Ambulatory Visit: Payer: Medicare Other

## 2015-11-13 ENCOUNTER — Other Ambulatory Visit (INDEPENDENT_AMBULATORY_CARE_PROVIDER_SITE_OTHER): Payer: Medicare Other

## 2015-11-13 DIAGNOSIS — Z5181 Encounter for therapeutic drug level monitoring: Secondary | ICD-10-CM | POA: Diagnosis not present

## 2015-11-13 DIAGNOSIS — Z7901 Long term (current) use of anticoagulants: Secondary | ICD-10-CM

## 2015-11-13 DIAGNOSIS — I4891 Unspecified atrial fibrillation: Secondary | ICD-10-CM | POA: Diagnosis not present

## 2015-11-13 LAB — POCT INR: INR: 3.4

## 2015-11-17 ENCOUNTER — Other Ambulatory Visit: Payer: Self-pay | Admitting: Family Medicine

## 2015-11-17 DIAGNOSIS — E119 Type 2 diabetes mellitus without complications: Secondary | ICD-10-CM

## 2015-11-27 ENCOUNTER — Other Ambulatory Visit (INDEPENDENT_AMBULATORY_CARE_PROVIDER_SITE_OTHER): Payer: Medicare Other

## 2015-11-27 DIAGNOSIS — E119 Type 2 diabetes mellitus without complications: Secondary | ICD-10-CM

## 2015-11-27 LAB — HEMOGLOBIN A1C: HEMOGLOBIN A1C: 8.3 % — AB (ref 4.6–6.5)

## 2015-11-28 ENCOUNTER — Other Ambulatory Visit: Payer: Self-pay | Admitting: *Deleted

## 2015-11-28 ENCOUNTER — Other Ambulatory Visit: Payer: Self-pay | Admitting: Family Medicine

## 2015-11-29 MED ORDER — FUROSEMIDE 20 MG PO TABS: 20.0000 mg | ORAL_TABLET | Freq: Every day | ORAL | 0 refills | Status: DC | PRN

## 2015-11-29 NOTE — Telephone Encounter (Signed)
Rx(s) sent to pharmacy electronically.  

## 2015-12-01 ENCOUNTER — Ambulatory Visit (INDEPENDENT_AMBULATORY_CARE_PROVIDER_SITE_OTHER): Payer: Medicare Other | Admitting: Family Medicine

## 2015-12-01 ENCOUNTER — Encounter: Payer: Self-pay | Admitting: Family Medicine

## 2015-12-01 VITALS — BP 138/78 | HR 66 | Temp 97.8°F | Wt 244.5 lb

## 2015-12-01 DIAGNOSIS — I10 Essential (primary) hypertension: Secondary | ICD-10-CM | POA: Diagnosis not present

## 2015-12-01 DIAGNOSIS — E7800 Pure hypercholesterolemia, unspecified: Secondary | ICD-10-CM | POA: Diagnosis not present

## 2015-12-01 DIAGNOSIS — E119 Type 2 diabetes mellitus without complications: Secondary | ICD-10-CM | POA: Diagnosis not present

## 2015-12-01 DIAGNOSIS — Z952 Presence of prosthetic heart valve: Secondary | ICD-10-CM

## 2015-12-01 DIAGNOSIS — Z23 Encounter for immunization: Secondary | ICD-10-CM

## 2015-12-01 DIAGNOSIS — R140 Abdominal distension (gaseous): Secondary | ICD-10-CM

## 2015-12-01 NOTE — Assessment & Plan Note (Signed)
A1c up, needs work on diet and exercise.  He'll try both for 3 months and if not some better on home checks in the meantime then he'll update me.  We can add meds if needed but he would like to defer for now.  This is reasonable.  Recheck A1c in about 3 months prior to visit.

## 2015-12-01 NOTE — Patient Instructions (Addendum)
Don't change your meds for now but work on diet and exercise.  Recheck in about 3 months.  If your sugar isn't coming down or if getting worse, then let me know in the meantime.   Try a few doses of miralax (once daily) in the meantime and let me know if not better in the meantime.   Take care.  Glad to see you.

## 2015-12-01 NOTE — Addendum Note (Signed)
Addended by: Josetta Huddle on: 12/01/2015 12:20 PM   Modules accepted: Orders

## 2015-12-01 NOTE — Progress Notes (Signed)
Pre visit review using our clinic review tool, if applicable. No additional management support is needed unless otherwise documented below in the visit note. 

## 2015-12-01 NOTE — Assessment & Plan Note (Signed)
Controlled on recheck.  Continue work on diet and exercise.  D/w pt.  He agrees.

## 2015-12-01 NOTE — Assessment & Plan Note (Signed)
Prev LDL not at goal.  A1c was likely a bigger issue now.  Continue work on diet and exercise as that could help DM HTN HLD.  D/w pt.  He agrees.

## 2015-12-01 NOTE — Progress Notes (Signed)
Diabetes:  Using medications without difficulties:yes Hypoglycemic episodes:no Hyperglycemic episodes:no Feet problems:no Blood Sugars averaging: higher in the last few months, usually 150-200.   eye exam within last year:yes Flu shot today.  D/w pt.   A1c up, d/w pt.  He thinks he can work on this with diet and exercise. D/w pt about both.   More of a gassy sensation recently after having a cold (prev cold sx resolved).  Going on for the last few weeks.  No vomiting.  He feels bloated.  He doesn't have GERD sx like prev.  No jaundice, no blood in stool.  No black stools.  No abd pain.   Hypertension:    Using medication without problems or lightheadedness: yes Chest pain with exertion:no Edema:no Short of breath:no Average home BPs: not checked at home.    Elevated Cholesterol: No meds.  Prev LDL 121.  D/w pt about diet and exercise.  We can recheck later on.    H/o valve replacement.  Taking abx prior to dental visits.  No BLE edema, no CP, no SOB.  Still on coumadin.  No bleeding.  Compliant with med.    PMH and SH reviewed  Meds, vitals, and allergies reviewed.   ROS: Per HPI unless specifically indicated in ROS section   GEN: nad, alert and oriented HEENT: mucous membranes moist NECK: supple w/o LA CV: rrr. Valve click noted, at baseline.   PULM: ctab, no inc wob ABD: soft, +bs, no masses, not ttp EXT: no edema SKIN: no acute rash, no jaundice

## 2015-12-01 NOTE — Assessment & Plan Note (Signed)
Continue abx prior to dental visits.

## 2015-12-01 NOTE — Assessment & Plan Note (Signed)
Noted after URI.  No red flag sx o/w.  Benign abd exam.  He didn't know if bowel regimen would help.  Reasonable to try a few doses of miralax and update me if not better.  Doesn't appear to be in heart failure, clearly not acute abd, and okay for outpatient f/u.  He agrees.

## 2015-12-11 ENCOUNTER — Other Ambulatory Visit: Payer: Medicare Other

## 2015-12-12 ENCOUNTER — Other Ambulatory Visit: Payer: Self-pay

## 2015-12-12 ENCOUNTER — Telehealth: Payer: Self-pay | Admitting: Family Medicine

## 2015-12-12 ENCOUNTER — Other Ambulatory Visit: Payer: Self-pay | Admitting: Family Medicine

## 2015-12-12 MED ORDER — FUROSEMIDE 20 MG PO TABS: 20.0000 mg | ORAL_TABLET | Freq: Every day | ORAL | 0 refills | Status: DC | PRN

## 2015-12-12 NOTE — Telephone Encounter (Signed)
Patient did not come in for their appointment on 12/11/15 for PT INR. Please let me know if patient needs to be contacted immediately for follow up or no follow up needed.

## 2015-12-13 NOTE — Telephone Encounter (Signed)
Please call to get him scheduled. Thanks.

## 2015-12-13 NOTE — Telephone Encounter (Signed)
Pt scheduled for 11/17 Pt aware

## 2015-12-15 ENCOUNTER — Ambulatory Visit (INDEPENDENT_AMBULATORY_CARE_PROVIDER_SITE_OTHER): Payer: Medicare Other

## 2015-12-15 DIAGNOSIS — I4891 Unspecified atrial fibrillation: Secondary | ICD-10-CM

## 2015-12-15 DIAGNOSIS — Z5181 Encounter for therapeutic drug level monitoring: Secondary | ICD-10-CM

## 2015-12-15 DIAGNOSIS — Z952 Presence of prosthetic heart valve: Secondary | ICD-10-CM

## 2015-12-15 LAB — POCT INR: INR: 2.3

## 2015-12-15 NOTE — Patient Instructions (Signed)
Pre visit review using our clinic review tool, if applicable. No additional management support is needed unless otherwise documented below in the visit note. 

## 2015-12-16 NOTE — Progress Notes (Signed)
Agree 

## 2016-01-12 ENCOUNTER — Ambulatory Visit (INDEPENDENT_AMBULATORY_CARE_PROVIDER_SITE_OTHER): Payer: Medicare Other

## 2016-01-12 DIAGNOSIS — Z5181 Encounter for therapeutic drug level monitoring: Secondary | ICD-10-CM | POA: Diagnosis not present

## 2016-01-12 DIAGNOSIS — Z7901 Long term (current) use of anticoagulants: Secondary | ICD-10-CM

## 2016-01-12 DIAGNOSIS — Z952 Presence of prosthetic heart valve: Secondary | ICD-10-CM | POA: Diagnosis not present

## 2016-01-12 DIAGNOSIS — I4891 Unspecified atrial fibrillation: Secondary | ICD-10-CM | POA: Diagnosis not present

## 2016-01-12 LAB — POCT INR: INR: 2.5

## 2016-01-12 NOTE — Patient Instructions (Signed)
Pre visit review using our clinic review tool, if applicable. No additional management support is needed unless otherwise documented below in the visit note. 

## 2016-01-14 NOTE — Progress Notes (Signed)
Agree. Thanks

## 2016-01-22 ENCOUNTER — Emergency Department
Admission: EM | Admit: 2016-01-22 | Discharge: 2016-01-23 | Disposition: A | Discharge status: Home / Self Care | Payer: Medicare Other | Attending: Emergency Medicine | Admitting: Emergency Medicine

## 2016-01-22 DIAGNOSIS — T18128A Food in esophagus causing other injury, initial encounter: Secondary | ICD-10-CM | POA: Diagnosis present

## 2016-01-22 DIAGNOSIS — Z79899 Other long term (current) drug therapy: Secondary | ICD-10-CM | POA: Diagnosis not present

## 2016-01-22 DIAGNOSIS — T18108D Unspecified foreign body in esophagus causing other injury, subsequent encounter: Secondary | ICD-10-CM | POA: Diagnosis not present

## 2016-01-22 DIAGNOSIS — K317 Polyp of stomach and duodenum: Secondary | ICD-10-CM | POA: Diagnosis not present

## 2016-01-22 DIAGNOSIS — I110 Hypertensive heart disease with heart failure: Secondary | ICD-10-CM | POA: Insufficient documentation

## 2016-01-22 DIAGNOSIS — T18108A Unspecified foreign body in esophagus causing other injury, initial encounter: Secondary | ICD-10-CM

## 2016-01-22 DIAGNOSIS — Z7901 Long term (current) use of anticoagulants: Secondary | ICD-10-CM | POA: Insufficient documentation

## 2016-01-22 DIAGNOSIS — K2970 Gastritis, unspecified, without bleeding: Secondary | ICD-10-CM | POA: Insufficient documentation

## 2016-01-22 DIAGNOSIS — E119 Type 2 diabetes mellitus without complications: Secondary | ICD-10-CM | POA: Diagnosis not present

## 2016-01-22 DIAGNOSIS — E669 Obesity, unspecified: Secondary | ICD-10-CM | POA: Insufficient documentation

## 2016-01-22 DIAGNOSIS — X58XXXA Exposure to other specified factors, initial encounter: Secondary | ICD-10-CM | POA: Diagnosis not present

## 2016-01-22 DIAGNOSIS — Z6834 Body mass index (BMI) 34.0-34.9, adult: Secondary | ICD-10-CM | POA: Insufficient documentation

## 2016-01-22 DIAGNOSIS — W44F3XA Food entering into or through a natural orifice, initial encounter: Secondary | ICD-10-CM

## 2016-01-22 DIAGNOSIS — K222 Esophageal obstruction: Secondary | ICD-10-CM | POA: Diagnosis not present

## 2016-01-22 DIAGNOSIS — I509 Heart failure, unspecified: Secondary | ICD-10-CM | POA: Insufficient documentation

## 2016-01-22 DIAGNOSIS — Z952 Presence of prosthetic heart valve: Secondary | ICD-10-CM | POA: Insufficient documentation

## 2016-01-22 DIAGNOSIS — Z7984 Long term (current) use of oral hypoglycemic drugs: Secondary | ICD-10-CM | POA: Insufficient documentation

## 2016-01-22 MED ORDER — ONDANSETRON HCL 4 MG/2ML IJ SOLN
INTRAMUSCULAR | Status: AC
  Administered 2016-01-22: 4 mg via INTRAVENOUS
  Filled 2016-01-22: qty 2

## 2016-01-22 MED ORDER — GLUCAGON HCL RDNA (DIAGNOSTIC) 1 MG IJ SOLR
INTRAMUSCULAR | Status: AC
  Administered 2016-01-22: 1 mg via INTRAVENOUS
  Filled 2016-01-22: qty 1

## 2016-01-22 MED ORDER — ONDANSETRON HCL 4 MG/2ML IJ SOLN
4.0000 mg | INTRAMUSCULAR | Status: AC
  Administered 2016-01-22: 4 mg via INTRAVENOUS

## 2016-01-22 MED ORDER — GLUCAGON HCL RDNA (DIAGNOSTIC) 1 MG IJ SOLR
1.0000 mg | Freq: Once | INTRAMUSCULAR | Status: AC | PRN
  Administered 2016-01-22: 1 mg via INTRAVENOUS

## 2016-01-22 NOTE — ED Triage Notes (Signed)
Pt presents to ED with c/o having food lodged in his throat with h/x of same requiring esophogeal stretching. Pt reports being unable to swallowing anything, saliva and water come back up. Pt reports substernal CP  "from throwing up so much", ut denies any problems breathing.

## 2016-01-22 NOTE — ED Provider Notes (Signed)
[facility] Emergency Department Provider Note  ____________________________________________   First MD Initiated Contact with Patient 01/22/16 2331     (approximate)  I have reviewed the triage vital signs and the nursing notes.   HISTORY  Chief Complaint Foreign Body    HPI [name] [name] is a 65 y.o. male with a history of esophageal stricture and multiple food bolus impactions in the past 2 presents for evaluation of probable food bolus impaction in esophagus.  He reports that almost 12 hours ago he was eating some chicken and had already eaten quite a bit so he was already feeling quite full.  Upon eating the chicken he feels like a piece got stuck with acute onset of substernal discomfort consistent with his prior episodes of food bolus impaction.  Nothing is making it better or worse over the last 12 hours.  He is able to swallow his saliva at times but has not been able to eat or drink anything.  He has had some vomiting but that does not seem to help relieve the obstruction.  He describes the substernal discomfort as tightness and a dull ache which he thinks is the result of having the food stuck for so long and multiple episodes of vomiting.  He denies fever/chills, any other chest pain, shortness of breath, lower abdominal pain, diarrhea, dysuria.  He reports that in the past nitroglycerin has helped relax the muscles and get a food bolus down but he has no history of ACS or heart attack and does not have nitroglycerin at home.   Past Medical History:  Diagnosis Date  . Acute pancreatitis    with pancreatic necrosis  . Anemia   . Aortic insufficiency    with bicuspid aortic valve (the last MRI demonstrated the aortic root to be 4.8 x 4.7 cm.) Mod regurgitation. Normal chamber size with very mild left ventricular dysfunction.)  . Blood transfusion   . CHF (congestive heart failure) ([place])   . Cough 06/28/2009   Dr. Melvyn Novas  . Diabetes mellitus  without complication ([place])   . Duodenal ulcer    with GI bleeding  . Dyslipidemia   . GERD (gastroesophageal reflux disease) 03/23/2001   egd w/ dilation...Marland KitchenMarland KitchenDeatra Ina  . Hemorrhoids   . Hypertension   . 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 Orthostasis 08/2009   Fever and syncope, no source for fever on cultures.  . Pericardial effusion 07/2009   admission to [facility], s/p pericardial window  . Pleural effusion 10/2009   s/p thoracentesis  . PNA (pneumonia) 10/2009   admnission to [place], with right pleural effusion, s/p thoracentesis  . Pneumonia 2011   "twice"  . Syncope 08/2009   admission for fever and syncope, no source for fever seen on cultures. Syncope thought to be r/t orthostasis    Patient Active Problem List   Diagnosis Date Noted  . Abdominal bloating 12/01/2015  . S/P AVR 06/20/2014  . Advance care planning 05/12/2014  . Rash and nonspecific skin eruption 05/12/2014  . Seborrheic keratoses 02/04/2014  . Medicare annual wellness visit, subsequent 04/02/2013  . Encounter for therapeutic drug monitoring 04/01/2013  . Diabetes mellitus, type II ([place]) 11/05/2011  . History of pancreatitis 08/27/2011  . Anemia 01/01/2011  . 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 Dyspnea 09/12/2010  . Dysphagia, unspecified(787.20) 08/30/2010  . Shoulder pain, right 06/07/2010  . Long term current use of anticoagulant 04/20/2010  .  Cough 02/15/2010  . Aortic valve replaced 02/15/2010  . FATIGUE 02/13/2010  . PERICARDIAL EFFUSION 11/02/2009  . ACUTE PANCREATITIS 11/02/2009  . Cyst and pseudocyst of pancreas 10/19/2009  . OBSTRUCTIVE SLEEP APNEA 10/05/2009  . PALPITATIONS 09/19/2009  . Atrial fibrillation ([place]) 08/15/2009  . DERMATITIS 10/05/2008  . NEOP, UB, SKIN 08/08/2006  . HYPERCHOLESTEROLEMIA, PURE 07/31/2006  . ELEVATION, TRANSAMINASE/LDH LEVELS 07/31/2006  . OBESITY 07/17/2006  . Essential  hypertension 07/17/2006  . AORTIC INSUFFICIENCY 07/17/2006  . GERD 07/17/2006  . LIVER FUNCTION TESTS, ABNORMAL 07/17/2006    Past Surgical History:  Procedure Laterality Date  . 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 Cardiolite EKG  03/10/2003   (-) ? small/apical infarct EF 50%  . DOPPLER ECHOCARDIOGRAPHY  01/18/97   LVH, mild dilated aorta - root mod to severe aortic regurg EF 50-55%  . DOPPLER ECHOCARDIOGRAPHY  09/27/03   No changes, moderate severe aortic regurg  . DOPPLER ECHOCARDIOGRAPHY  07/09/2004   Mod severe aortic regurg 2-3+ T.R., T.I.R., mild P..R.  . DOPPLER ECHOCARDIOGRAPHY  04/01/06   Hypokin Apex EF 55%  . DOPPLER ECHOCARDIOGRAPHY  02/15/2010   Mild LVH, mild decr Sys fctn EF 40-45% Mild Dias Dysfctn Mech AV Triv AR, MR  . ESOPHAGOGASTRODUODENOSCOPY  1990, 1999   Hot dog in throat (1987)/ Chicken in throat (1990)/ 01/06/97 (Dr. Earlean Shawl) Impaction, dilatation, Schatzki's Ring/ 07/1998 repeat Dilatation/ 01/10/01 stricture/dilated/ 03/08/05 Schatzki's Ring, dilated/ 06/01/08 Food Disimpaction [name] Bulb Ulcer (Dr. Watt Climes)  . ESOPHAGOGASTRODUODENOSCOPY  01/06/1997   (Dr. Earlean Shawl) Impaction, dilatation, Schatzki's Ring  . ESOPHAGOGASTRODUODENOSCOPY  08/09/98   Repeat dilatation Deatra Ina)  . ESOPHAGOGASTRODUODENOSCOPY  01/13/2001   stricture/ HH, duodenal ulcer / bleeding Henrene Pastor)  . ESOPHAGOGASTRODUODENOSCOPY  12/25/01   esophagus stricture, dilated  . ESOPHAGOGASTRODUODENOSCOPY  03/23/2001   stricture, dilated  . ESOPHAGOGASTRODUODENOSCOPY  03/08/05   Schatzki's Ring   03/13/05  Schatzki's Ring - dilated, duodenitis  . ESOPHAGOGASTRODUODENOSCOPY  06/11/08   with food disimpaction.  Food impaction Ms [place]  Sm bulb ulcer (Dr. Watt Climes)  . ESOPHAGOGASTRODUODENOSCOPY     stricture/HH, duodenal ulcer/bleeding Henrene Pastor)  . HERNIA REPAIR  2011   abdominal; post AVR  . MRI  2/05   LVH global hypokin EF 48% Mod A.I.  . VALVE REPLACEMENT  06/2009   St. Jude mechanical valve per Dr.  Cyndia Bent    Prior to Admission medications   Medication Sig Start Date End Date Taking? Authorizing Provider  azelastine (ASTELIN) 0.1 % nasal spray Place 2 sprays into both nostrils 2 (two) times daily as needed for rhinitis. Use in each nostril as directed   Yes Historical Provider, MD  carvedilol (COREG) 6.25 MG tablet Take 1 tablet (6.25 mg total) by mouth 2 (two) times daily. 09/07/15 09/06/16 Yes Minus Breeding, MD  diphenhydrAMINE (BENADRYL) 25 MG tablet Take 25 mg by mouth every 6 (six) hours as needed for itching.    Yes Historical Provider, MD  docusate sodium (COLACE) 100 MG capsule Take 100 mg by mouth every other day.    Yes Historical Provider, MD  furosemide (LASIX) 20 MG tablet Take 1 tablet (20 mg total) by mouth daily as needed. For feet swelling PLEASE CONTACT OFFICE FOR ADDITIONAL REFILLS 12/12/15  Yes Minus Breeding, MD  losartan (COZAAR) 50 MG tablet Take 1 tablet (50 mg total) by mouth daily. 05/15/15  Yes Tonia Ghent, MD  metFORMIN (GLUCOPHAGE) 500 MG tablet TAKE UP TO 2 TABLETS BY MOUTH TWICE DAILY 11/28/15  Yes  Joaquim Nam, MD  potassium chloride (K-DUR) 10 MEQ tablet TAKE 1 TABLET BY MOUTH TWICE A DAY 12/12/15  Yes Joaquim Nam, MD  warfarin (COUMADIN) 2 MG tablet Take 2 mg by mouth every Monday, Wednesday, and Friday.   Yes Historical Provider, MD  warfarin (COUMADIN) 4 MG tablet Take 4 mg by mouth every Tuesday, Thursday, Saturday, and Sunday at 6 PM.   Yes Historical Provider, MD  ACCU-CHEK FASTCLIX LANCETS MISC USE AS DIRECTED Dx E11.9 05/18/15   Joaquim Nam, MD  amoxicillin (AMOXIL) 500 MG tablet Take 2,000 mg by mouth once. Before dental visits.    Historical Provider, MD  Blood Glucose Monitoring Suppl (ACCU-CHEK AVIVA PLUS) W/DEVICE KIT 1 kit by Does not apply route once. 11/05/11   Joaquim Nam, MD  glucose blood (ACCU-CHEK AVIVA PLUS) test strip USE AS DIRECTED TO CHECK BLOOD SUGAR 2 TO 4 TIMES A DAY.  Diagnosis:  E11.9    Non-insulin dependent.  12/29/14   Joaquim Nam, MD  triamcinolone cream (KENALOG) 0.5 % APPLY TO SKIN TWICE DAILY AS NEEDED 08/11/15   Joaquim Nam, MD  warfarin (COUMADIN) 1 MG tablet TAKE AS DIRECTED BY ANTICOAGULATION CLINIC 09/27/15   Joaquim Nam, MD    Allergies Ace inhibitors and Primaxin [imipenem w/cilastatin sodium]  Family History  Problem Relation Age of Onset  . Obesity Mother     bedridden  . Hypertension Mother   . Heart disease Father     CABG, CAD  . Hypertension Father   . Lymphoma Father   . Cancer Father     lymphoma  . Alcohol abuse Brother   . Cancer Brother     melanoma  . Colon cancer Neg Hx   . Stomach cancer Neg Hx   . Rectal cancer Neg Hx   . Prostate cancer Neg Hx     Social History Social History  Substance Use Topics  . Smoking status: Never Smoker  . Smokeless tobacco: Never Used  . Alcohol use No    Review of Systems Constitutional: No fever/chills Eyes: No visual changes. ENT: No sore throat. Cardiovascular: Denies chest pain. Respiratory: Denies shortness of breath. Gastrointestinal: Upper abdominal/substernal chest discomfort thought to be due to a chicken food bolus impaction Genitourinary: Negative for dysuria. Musculoskeletal: Negative for back pain. Skin: Negative for rash. Neurological: Negative for headaches, focal weakness or numbness.  10-point ROS otherwise negative.  ____________________________________________   PHYSICAL EXAM:  VITAL SIGNS: ED Triage Vitals  Enc Vitals Group     BP 01/22/16 2325 (!) 177/87     Pulse Rate 01/22/16 2325 60     Resp 01/22/16 2325 16     Temp --      Temp src --      SpO2 01/22/16 2325 99 %     Weight 01/22/16 2326 242 lb (109.8 kg)     Height 01/22/16 2326 5\' 11"  (1.803 m)     Head Circumference --      Peak Flow --      Pain Score 01/22/16 2326 3     Pain Loc --      Pain Edu? --      Excl. in GC? --     Constitutional: Alert and oriented. Well appearing and in no acute  distress. Eyes: Conjunctivae are normal. PERRL. EOMI. Head: Atraumatic. Nose: No congestion/rhinnorhea. Mouth/Throat: Mucous membranes are moist.  Oropharynx non-erythematous. Neck: No stridor.  No meningeal signs.   Cardiovascular: Normal rate,  regular rhythm. Good peripheral circulation. Grossly normal heart sounds. Respiratory: Normal respiratory effort.  No retractions. Lungs CTAB. Gastrointestinal: Soft and nontender. No distention. History of prior abdominal surgery. Musculoskeletal: No lower extremity tenderness nor edema. No gross deformities of extremities. Neurologic:  Normal speech and language. No gross focal neurologic deficits are appreciated.  Skin:  Skin is warm, dry and intact. No rash noted. Psychiatric: Mood and affect are normal. Speech and behavior are normal.  ____________________________________________   LABS (all labs ordered are listed, but only abnormal results are displayed)  Labs Reviewed  BASIC METABOLIC PANEL - Abnormal; Notable for the following:       Result Value   CO2 21 (*)    Glucose, Bld 194 (*)    All other components within normal limits  TROPONIN I - Abnormal; Notable for the following:    Troponin I 0.06 (*)    All other components within normal limits  TROPONIN I - Abnormal; Notable for the following:    Troponin I 0.06 (*)    All other components within normal limits  CBC WITH DIFFERENTIAL/PLATELET  PROTIME-INR   ____________________________________________  EKG  ED ECG REPORT I, Gurkaran Rahm, the attending physician, personally viewed and interpreted this ECG.  Date: 01/23/2016 EKG Time: 00:47 Rate: 62 Rhythm: normal sinus rhythm with first-degree AV block QRS Axis: normal Intervals: PR interval 217 ms, QRS complex slightly widened, over most compatible with nonspecific intraventricular block, changed from prior, but with no evidence of acute ischemia ST/T Wave abnormalities: normal Conduction Disturbances: none Narrative  Interpretation: unremarkable  ____________________________________________  RADIOLOGY   Ct Chest Wo Contrast  Result Date: 01/23/2016 CLINICAL DATA:  Fluid left is in his throat. History of valve esophageal stricture requiring dilatation. Still EXAM: CT CHEST WITHOUT CONTRAST TECHNIQUE: Multidetector CT imaging of the chest was performed following the standard protocol without IV contrast. COMPARISON:  CT abdomen from 07/02/2010 FINDINGS: Cardiovascular: Patient is status post median sternotomy. Aortic valvular replacement is noted. No pericardial effusion. Mediastinum/Nodes: Contrast filled dilatation of the thoracic esophagus is noted with mild thickening of the upper esophagus just proximal to an area of unopacified and presumed strictured 3 cm in length midesophageal segment. Distal to this non-opacified mid esophageal segment, contrast is noted to the GE juncture for there soft tissue debris noted within the esophagus. Only a scant amount of oral contrast is noted in the stomach. A second short segment tandem stricture is not excluded accounting for this appearance. Direct visual correlation is recommended. Lungs/Pleura: No acute pulmonary disease. Right upper and bibasilar subsegmental atelectasis and/or scarring is noted. No pneumonic consolidation or evidence of aspiration. No pneumothorax or effusion. Upper Abdomen: No acute abnormality within the abdomen. Musculoskeletal: No chest wall mass or suspicious bone lesions identified. IMPRESSION: Retained oral contrast noted within the dilated and slightly thickened appearing thoracic esophagus. A 3 cm mid esophageal segment is unopacified and stricture is not excluded (vs. peristalsis). Likewise at the GE junction, there is intraluminal soft tissue density within the esophagus which may represent debris, impacted food or soft tissue lesion/stricture only allowing a scant amount of oral contrast into the stomach. Direct visual correlation is  recommended. Electronically Signed   By: Ashley Royalty M.D.   On: 01/23/2016 03:06    ____________________________________________   PROCEDURES  Procedure(s) performed:   Procedures   Critical Care performed: No ____________________________________________   INITIAL IMPRESSION / ASSESSMENT AND PLAN / ED COURSE  Pertinent labs & imaging results that were available during my care of  the patient were reviewed by me and considered in my medical decision making (see chart for details).  The patient has been through this multiple times in the past.  Although he is describing substernal chest discomfort it was acute in onset after eating some chicken and he knows the way it feels.  He has been spitting in a bag occasionally because he feels like he is having difficulty tolerating his secretions but he is not drooling, tripoding, and at my request he was able to swallow his saliva.  I think that he has a partial obstruction although it is possible he may also have a piece of food stuck in his vallecula or some other place that will not pass but is causing him discomfort.  His airway is not in danger at this time.  I will try medication management first with Zofran followed by glucagon 1 mg IV.  We will also consider trying nitroglycerin sublingual tablet and sipping or even gargling warm soda.  If  Nothing is successful I will contact GI.  Patient agrees with the plan.  No indication for labs at this time.  Clinical Course as of Jan 23 655  Tue Jan 23, 2016  0011 No change in status after glucagon.  Sipping on soda, we encouraged him to drink it more rapidly to see if it will help the bolus move.  Will also try NTG sublingual.  [CF]  0938 Patient has vomited back up the soda that he was drinking with no improvement in his symptoms.  Getting NTG now, but suspect EGD will be required for further treatment.  [CF]  0100 No change after NTG.  Vomited the soda he drank most recently.  Will contact Dr.  Allen Norris who is on call until 7:00am to discuss the case.  [CF]  0158 Labs concerning for slightly positive troponin.  Given that the patient's EKG has changed and he has a positive troponin, I am reassessing the possibility of ACS.  Given the consistent symptoms compared to prior food boluses, I still suspect he has a food impaction, particularly since he is still vomiting when he tries to drink.  Most likely the troponin is demand ischemia given the discomfort he has been experiencing for more than 12 hours.  However I must consider the possibility that the substernal chest pain he is describing is not the result of a food bolus at all and is in fact ACS.I will plan for a repeat troponin but I am also going to obtain a noncontrast CT scan of the chest with a small dose of oral Gastrografin or similar contrast material to evaluate for esophageal impaction.  If this is negative, I will admit for ACS/cardiac cause of chest pain  [CF]  0310 Reassuring imaging in that there does seem to be an obstruction, again making ACS less likely.  Repeating troponin at 0500. CT Chest Wo Contrast [CF]  0539 Second troponin is unchanged at 0.06.  He has been resting comfortably and feels about the same as before.  I discussed the fact that his EKG is changed slightly and that his troponin is consistently elevated but is not going up in a way that would indicate ongoing cardiac dysfunction.  He has a cardiologist and said that he would follow up as soon as possible with the cardiologist.  I will also provide the information for Dr. Humphrey Rolls with [facility] should he not be able to find the contact information for his cardiologist.  Even though he will be  discharged from the EGD suite, I am providing discharge instructions with all the appropriate contact and follow-up information with the ED paper work so that he has a reference for when he calls cardiology follow-up.  He has remained hemodynamically stable for 7-1/2 hours in the emergency  department.  [CF]  [phone] I provided the patient with discharge information and urged him once again to please follow up with cardiology at the next available opportunity.  He feels well at this point with still a retained foreign body sensation and agrees that he will follow up as soon as possible.  [CF]    Clinical Course User Index [CF] Hinda Kehr, MD    ____________________________________________  FINAL CLINICAL IMPRESSION(S) / ED DIAGNOSES  Final diagnoses:  Esophageal obstruction due to food impaction     MEDICATIONS GIVEN DURING THIS VISIT:  Medications  ondansetron (ZOFRAN) injection 4 mg (4 mg Intravenous Given 01/22/16 2343)  glucagon (human recombinant) (GLUCAGEN) injection 1 mg (1 mg Intravenous Given 01/22/16 2345)  nitroGLYCERIN (NITROSTAT) SL tablet 0.4 mg (0.4 mg Sublingual Given 01/23/16 0051)     NEW OUTPATIENT MEDICATIONS STARTED DURING THIS VISIT:  New Prescriptions   No medications on file    Modified Medications   No medications on file    Discontinued Medications   BENZONATATE (TESSALON) 200 MG CAPSULE    TAKE 1 CAPSULE BY MOUTH 3 TIMES DAILY ASNEEDED FOR COUGH   WARFARIN (COUMADIN) 3 MG TABLET    TAKE AS DIRECTED BY ANTICOAGULATION CLINIC or as directed.     Note:  This document was prepared using Dragon voice recognition software and may include unintentional dictation errors.    Hinda Kehr, MD 01/23/16 [phone]

## 2016-01-23 ENCOUNTER — Ambulatory Visit: Admit: 2016-01-23 | Payer: Medicare Other | Admitting: Gastroenterology

## 2016-01-23 ENCOUNTER — Encounter
Admission: EM | Disposition: A | Discharge status: Home / Self Care | Payer: Self-pay | Source: Home / Self Care | Attending: Emergency Medicine

## 2016-01-23 ENCOUNTER — Emergency Department: Payer: Medicare Other | Admitting: Anesthesiology

## 2016-01-23 ENCOUNTER — Emergency Department: Payer: Medicare Other

## 2016-01-23 ENCOUNTER — Encounter: Payer: Self-pay | Admitting: *Deleted

## 2016-01-23 DIAGNOSIS — T18128A Food in esophagus causing other injury, initial encounter: Secondary | ICD-10-CM | POA: Diagnosis not present

## 2016-01-23 DIAGNOSIS — W44F3XA Food entering into or through a natural orifice, initial encounter: Secondary | ICD-10-CM

## 2016-01-23 DIAGNOSIS — K222 Esophageal obstruction: Secondary | ICD-10-CM | POA: Diagnosis not present

## 2016-01-23 DIAGNOSIS — T18108D Unspecified foreign body in esophagus causing other injury, subsequent encounter: Secondary | ICD-10-CM

## 2016-01-23 DIAGNOSIS — T18108A Unspecified foreign body in esophagus causing other injury, initial encounter: Secondary | ICD-10-CM

## 2016-01-23 HISTORY — PX: ESOPHAGOGASTRODUODENOSCOPY: SHX5428

## 2016-01-23 LAB — CBC WITH DIFFERENTIAL/PLATELET
BASOS ABS: 0 10*3/uL (ref 0–0.1)
Basophils Relative: 1 %
Eosinophils Absolute: 0.2 10*3/uL (ref 0–0.7)
Eosinophils Relative: 3 %
HEMATOCRIT: 43 % (ref 40.0–52.0)
HEMOGLOBIN: 15.3 g/dL (ref 13.0–18.0)
LYMPHS ABS: 2 10*3/uL (ref 1.0–3.6)
LYMPHS PCT: 21 %
MCH: 32.2 pg (ref 26.0–34.0)
MCHC: 35.6 g/dL (ref 32.0–36.0)
MCV: 90.5 fL (ref 80.0–100.0)
Monocytes Absolute: 0.7 10*3/uL (ref 0.2–1.0)
Monocytes Relative: 8 %
NEUTROS ABS: 6.4 10*3/uL (ref 1.4–6.5)
NEUTROS PCT: 67 %
PLATELETS: 153 10*3/uL (ref 150–440)
RBC: 4.75 MIL/uL (ref 4.40–5.90)
RDW: 13.9 % (ref 11.5–14.5)
WBC: 9.4 10*3/uL (ref 3.8–10.6)

## 2016-01-23 LAB — BASIC METABOLIC PANEL
ANION GAP: 10 (ref 5–15)
BUN: 16 mg/dL (ref 6–20)
CHLORIDE: 106 mmol/L (ref 101–111)
CO2: 21 mmol/L — AB (ref 22–32)
Calcium: 9.2 mg/dL (ref 8.9–10.3)
Creatinine, Ser: 0.72 mg/dL (ref 0.61–1.24)
GFR calc Af Amer: >60 mL/min (ref >60)
GLUCOSE: 194 mg/dL — AB (ref 65–99)
POTASSIUM: 4 mmol/L (ref 3.5–5.1)
Sodium: 137 mmol/L (ref 135–145)

## 2016-01-23 LAB — PROTIME-INR
INR: 2.07
PROTHROMBIN TIME: 23.6 s — AB (ref 11.4–15.2)

## 2016-01-23 LAB — TROPONIN I
Troponin I: 0.06 ng/mL (ref <0.03)
Troponin I: 0.06 ng/mL (ref <0.03)

## 2016-01-23 SURGERY — EGD (ESOPHAGOGASTRODUODENOSCOPY)
Anesthesia: General

## 2016-01-23 MED ORDER — SODIUM CHLORIDE 0.9 % IV SOLN
INTRAVENOUS | Status: DC | PRN
  Administered 2016-01-23: 10:00:00 via INTRAVENOUS

## 2016-01-23 MED ORDER — FENTANYL CITRATE (PF) 100 MCG/2ML IJ SOLN
INTRAMUSCULAR | Status: AC
  Filled 2016-01-23: qty 2

## 2016-01-23 MED ORDER — FENTANYL CITRATE (PF) 100 MCG/2ML IJ SOLN
INTRAMUSCULAR | Status: DC | PRN
  Administered 2016-01-23: 100 ug via INTRAVENOUS

## 2016-01-23 MED ORDER — PROPOFOL 10 MG/ML IV BOLUS
INTRAVENOUS | Status: DC | PRN
  Administered 2016-01-23: 150 mg via INTRAVENOUS

## 2016-01-23 MED ORDER — SUCCINYLCHOLINE CHLORIDE 20 MG/ML IJ SOLN
INTRAMUSCULAR | Status: DC | PRN
  Administered 2016-01-23: 120 mg via INTRAVENOUS

## 2016-01-23 MED ORDER — FENTANYL CITRATE (PF) 100 MCG/2ML IJ SOLN: 25.0000 ug | INTRAMUSCULAR | Status: DC | PRN

## 2016-01-23 MED ORDER — PROPOFOL 10 MG/ML IV BOLUS
INTRAVENOUS | Status: AC
  Filled 2016-01-23: qty 20

## 2016-01-23 MED ORDER — NITROGLYCERIN 0.4 MG SL SUBL
0.4000 mg | SUBLINGUAL_TABLET | Freq: Once | SUBLINGUAL | Status: AC
  Administered 2016-01-23: 0.4 mg via SUBLINGUAL
  Filled 2016-01-23: qty 1

## 2016-01-23 MED ORDER — SUCCINYLCHOLINE CHLORIDE 200 MG/10ML IV SOSY
PREFILLED_SYRINGE | INTRAVENOUS | Status: AC
  Filled 2016-01-23: qty 10

## 2016-01-23 MED ORDER — ONDANSETRON HCL 4 MG/2ML IJ SOLN: 4.0000 mg | Freq: Once | INTRAMUSCULAR | Status: DC | PRN

## 2016-01-23 NOTE — ED Notes (Signed)
Tech transporting pt to endo.

## 2016-01-23 NOTE — Anesthesia Preprocedure Evaluation (Signed)
Anesthesia Evaluation  Patient identified by MRN, date of birth, ID band Patient awake    Reviewed: Allergy & Precautions, NPO status , Patient's Chart, lab work & pertinent test results  History of Anesthesia Complications Negative for: history of anesthetic complications  Airway Mallampati: III       Dental   Pulmonary sleep apnea ,           Cardiovascular hypertension, Pt. on medications +CHF (hx)       Neuro/Psych negative neurological ROS     GI/Hepatic Neg liver ROS, PUD, GERD  ,  Endo/Other  diabetes, Type 2, Oral Hypoglycemic Agents  Renal/GU negative Renal ROS     Musculoskeletal   Abdominal   Peds  Hematology  (+) anemia ,   Anesthesia Other Findings   Reproductive/Obstetrics                             Anesthesia Physical Anesthesia Plan  ASA: III  Anesthesia Plan: General   Post-op Pain Management:    Induction: Intravenous  Airway Management Planned: Oral ETT  Additional Equipment:   Intra-op Plan:   Post-operative Plan:   Informed Consent: I have reviewed the patients History and Physical, chart, labs and discussed the procedure including the risks, benefits and alternatives for the proposed anesthesia with the patient or authorized representative who has indicated his/her understanding and acceptance.     Plan Discussed with:   Anesthesia Plan Comments:         Anesthesia Quick Evaluation

## 2016-01-23 NOTE — Transfer of Care (Signed)
Immediate Anesthesia Transfer of Care Note  Patient: [name] [name]  Procedure(s) Performed: Procedure(s): ESOPHAGOGASTRODUODENOSCOPY (EGD) (N/A)  Patient Location: PACU  Anesthesia Type:General  Level of Consciousness: awake, alert  and oriented  Airway & Oxygen Therapy: Patient Spontanous Breathing and Patient connected to face mask oxygen  Post-op Assessment vss reviewed and stable  Post vital signs: Reviewed and stable  Last Vitals:  Vitals:   01/23/16 0722 01/23/16 1008  BP: (!) 145/74 (!) 148/78  Pulse: (!) 56 66  Resp: 17 18  Temp:  37 C    Last Pain:  Vitals:   01/22/16 2326  PainSc: 3          Complications: No apparent anesthesia complications

## 2016-01-23 NOTE — ED Notes (Signed)
Lab called to advise that sample previously sent unable to be used. RN to send new blue tube.

## 2016-01-23 NOTE — H&P (Signed)
[name] Lame, MD [facility] [address], [place], [place] [address] Phone: [phone] Fax : [phone]  Primary Care Physician:  Elsie Stain, MD Primary Gastroenterologist:  Dr. Allen Norris  Pre-Procedure History & Physical: HPI:  [name] [name] is a 65 y.o. male is here for an endoscopy.   Past Medical History:  Diagnosis Date  . Acute pancreatitis    with pancreatic necrosis  . Anemia   . Aortic insufficiency    with bicuspid aortic valve (the last MRI demonstrated the aortic root to be 4.8 x 4.7 cm.) Mod regurgitation. Normal chamber size with very mild left ventricular dysfunction.)  . Blood transfusion   . CHF (congestive heart failure) ([place])   . Cough 06/28/2009   Dr. Melvyn Novas  . Diabetes mellitus without complication ([place])   . Duodenal [name]    with GI bleeding  . Dyslipidemia   . GERD (gastroesophageal reflux disease) 03/23/2001   egd w/ dilation...Marland KitchenMarland KitchenDeatra [name]  . Hemorrhoids   . Hypertension   . 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 Orthostasis 08/2009   Fever and syncope, no source for fever on cultures.  . Pericardial effusion 07/2009   admission to [facility], s/p pericardial window  . Pleural effusion 10/2009   s/p thoracentesis  . PNA (pneumonia) 10/2009   admnission to [place], with right pleural effusion, s/p thoracentesis  . Pneumonia 2011   "twice"  . Syncope 08/2009   admission for fever and syncope, no source for fever seen on cultures. Syncope thought to be r/t orthostasis    Past Surgical History:  Procedure Laterality Date  . 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 Cardiolite EKG  03/10/2003   (-) ? small/apical infarct EF 50%  . DOPPLER ECHOCARDIOGRAPHY  01/18/97   LVH, mild dilated aorta - root mod to severe aortic regurg EF 50-55%  . DOPPLER ECHOCARDIOGRAPHY  09/27/03   No changes, moderate severe aortic regurg  . DOPPLER ECHOCARDIOGRAPHY  07/09/2004   Mod severe aortic regurg 2-3+ T.R., T.I.R.,  mild P..R.  . DOPPLER ECHOCARDIOGRAPHY  04/01/06   Hypokin Apex EF 55%  . DOPPLER ECHOCARDIOGRAPHY  02/15/2010   Mild LVH, mild decr Sys fctn EF 40-45% Mild Dias Dysfctn Mech AV Triv AR, MR  . ESOPHAGOGASTRODUODENOSCOPY  1990, 1999   Hot dog in throat (1987)/ Chicken in throat (1990)/ 01/06/97 (Dr. Earlean Shawl) Impaction, dilatation, Schatzki's Ring/ 07/1998 repeat Dilatation/ 01/10/01 stricture/dilated/ 03/08/05 Schatzki's Ring, dilated/ 06/01/08 Food Disimpaction [name] [name] (Dr. Watt Climes)  . ESOPHAGOGASTRODUODENOSCOPY  01/06/1997   (Dr. Earlean Shawl) Impaction, dilatation, Schatzki's Ring  . ESOPHAGOGASTRODUODENOSCOPY  08/09/98   Repeat dilatation [name] [name])  . ESOPHAGOGASTRODUODENOSCOPY  01/13/2001   stricture/ HH, duodenal [name] / bleeding [name] [name])  . ESOPHAGOGASTRODUODENOSCOPY  12/25/01   esophagus stricture, dilated  . ESOPHAGOGASTRODUODENOSCOPY  03/23/2001   stricture, dilated  . ESOPHAGOGASTRODUODENOSCOPY  03/08/05   Schatzki's Ring   03/13/05  Schatzki's Ring - dilated, duodenitis  . ESOPHAGOGASTRODUODENOSCOPY  06/11/08   with food disimpaction.  Food impaction [name] [name]  Sm bulb [name] (Dr. Watt Climes)  . ESOPHAGOGASTRODUODENOSCOPY     stricture/HH, duodenal [name]/bleeding [name] [name])  . HERNIA REPAIR  2011   abdominal; post AVR  . MRI  2/05   LVH global hypokin EF 48% Mod A.I.  . VALVE REPLACEMENT  06/2009   St. Jude mechanical valve per Dr. Cyndia Bent    Prior to Admission medications   Medication Sig Start Date End Date Taking? Authorizing Provider  azelastine (  ASTELIN) 0.1 % nasal spray Place 2 sprays into both nostrils 2 (two) times daily as needed for rhinitis. Use in each nostril as directed   Yes Historical Provider, MD  carvedilol (COREG) 6.25 MG tablet Take 1 tablet (6.25 mg total) by mouth 2 (two) times daily. 09/07/15 09/06/16 Yes Minus Breeding, MD  diphenhydrAMINE (BENADRYL) 25 MG tablet Take 25 mg by mouth every 6 (six) hours as needed for itching.    Yes Historical Provider, MD  docusate sodium  (COLACE) 100 MG capsule Take 100 mg by mouth every other day.    Yes Historical Provider, MD  furosemide (LASIX) 20 MG tablet Take 1 tablet (20 mg total) by mouth daily as needed. For feet swelling PLEASE CONTACT OFFICE FOR ADDITIONAL REFILLS 12/12/15  Yes Minus Breeding, MD  losartan (COZAAR) 50 MG tablet Take 1 tablet (50 mg total) by mouth daily. 05/15/15  Yes Tonia Ghent, MD  metFORMIN (GLUCOPHAGE) 500 MG tablet TAKE UP TO 2 TABLETS BY MOUTH TWICE DAILY 11/28/15  Yes Tonia Ghent, MD  potassium chloride (K-DUR) 10 MEQ tablet TAKE 1 TABLET BY MOUTH TWICE A DAY 12/12/15  Yes Tonia Ghent, MD  warfarin (COUMADIN) 2 MG tablet Take 2 mg by mouth every Monday, Wednesday, and Friday.   Yes Historical Provider, MD  warfarin (COUMADIN) 4 MG tablet Take 4 mg by mouth every Tuesday, Thursday, Saturday, and Sunday at 6 PM.   Yes Historical Provider, MD  ACCU-CHEK FASTCLIX LANCETS MISC USE AS DIRECTED Dx E11.9 05/18/15   Tonia Ghent, MD  amoxicillin (AMOXIL) 500 MG tablet Take 2,000 mg by mouth once. Before dental visits.    Historical Provider, MD  Blood Glucose Monitoring Suppl (ACCU-CHEK AVIVA PLUS) W/DEVICE KIT 1 kit by Does not apply route once. 11/05/11   Tonia Ghent, MD  glucose blood (ACCU-CHEK AVIVA PLUS) test strip USE AS DIRECTED TO CHECK BLOOD SUGAR 2 TO 4 TIMES A DAY.  Diagnosis:  E11.9    Non-insulin dependent. 12/29/14   Tonia Ghent, MD  triamcinolone cream (KENALOG) 0.5 % APPLY TO SKIN TWICE DAILY AS NEEDED 08/11/15   Tonia Ghent, MD  warfarin (COUMADIN) 1 MG tablet TAKE AS DIRECTED BY ANTICOAGULATION CLINIC 09/27/15   Tonia Ghent, MD    Allergies as of 01/22/2016 - Review Complete 01/22/2016  Allergen Reaction Noted  . Ace inhibitors    . Primaxin [imipenem w/cilastatin sodium]  06/07/2010    Family History  Problem Relation Age of Onset  . Obesity Mother     bedridden  . Hypertension Mother   . Heart disease Father     CABG, CAD  . Hypertension Father   .  Lymphoma Father   . Cancer Father     lymphoma  . Alcohol abuse Brother   . Cancer Brother     melanoma  . Colon cancer Neg Hx   . Stomach cancer Neg Hx   . Rectal cancer Neg Hx   . Prostate cancer Neg Hx     Social History   Social History  . Marital status: Married    Spouse name: N/A  . Number of children: 2  . Years of education: N/A   Occupational History  . mech. supervisor [employer]   Social History Main Topics  . Smoking status: Never Smoker  . Smokeless tobacco: Never Used  . Alcohol use No  . Drug use: No  . Sexual activity: Not on file  Other Topics Concern  . Not on file   Social History Narrative   Married 1996    Review of Systems: See HPI, otherwise negative ROS  Physical Exam: BP (!) 145/74 (BP Location: Right Arm)   Pulse (!) 56   Resp 17   Ht '5\' 11"'$  (1.803 m)   Wt 242 lb (109.8 kg)   SpO2 96%   BMI 33.75 kg/m  General:   Alert,  pleasant and cooperative in NAD Head:  Normocephalic and atraumatic. Neck:  Supple; no masses or thyromegaly. Lungs:  Clear throughout to auscultation.    Heart:  Regular rate and rhythm. Abdomen:  Soft, nontender and nondistended. Normal bowel sounds, without guarding, and without rebound.   Neurologic:  Alert and  oriented x4;  grossly normal neurologically.  Impression/Plan: ODEL SCHMID is here for an endoscopy to be performed for foreign body  Risks, benefits, limitations, and alternatives regarding  endoscopy have been reviewed with the patient.  Questions have been answered.  All parties agreeable.   [name] Lame, MD  01/23/2016, 10:03 AM

## 2016-01-23 NOTE — ED Notes (Signed)
MD with order for add on labs; PT/INR. Lab made aware of add on by this RN.

## 2016-01-23 NOTE — Anesthesia Procedure Notes (Signed)
Procedure Name: Intubation Date/Time: 01/23/2016 9:59 AM Performed by: Jonna Clark Pre-anesthesia Checklist: Patient identified, Patient being monitored, Timeout performed, Emergency Drugs available and Suction available Patient Re-evaluated:Patient Re-evaluated prior to inductionOxygen Delivery Method: Circle system utilized Preoxygenation: Pre-oxygenation with 100% oxygen Intubation Type: IV induction Ventilation: Mask ventilation without difficulty Laryngoscope Size: Mac and 3 Grade View: Grade III Tube type: Oral Tube size: 7.5 mm Number of attempts: 1 Placement Confirmation: ETT inserted through vocal cords under direct vision,  positive ETCO2 and breath sounds checked- equal and bilateral Secured at: 21 cm Tube secured with: Tape Dental Injury: Teeth and Oropharynx as per pre-operative assessment

## 2016-01-23 NOTE — ED Notes (Signed)
Resumed care from [place], [place]. Pt resting comfortably with wife at bedside. Pt understands plan of care. No needs expressed at this time. NAD noted.

## 2016-01-23 NOTE — Anesthesia Postprocedure Evaluation (Signed)
Anesthesia Post Note  Patient: [name] [name]  Procedure(s) Performed: Procedure(s) (LRB): ESOPHAGOGASTRODUODENOSCOPY (EGD) (N/A)  Patient location during evaluation: PACU Anesthesia Type: General Level of consciousness: awake and alert Pain management: pain level controlled Vital Signs Assessment: post-procedure vital signs reviewed and stable Respiratory status: spontaneous breathing and respiratory function stable Cardiovascular status: stable Anesthetic complications: no     Last Vitals:  Vitals:   01/23/16 0722 01/23/16 1008  BP: (!) 145/74 (!) 148/78  Pulse: (!) 56 66  Resp: 17 18  Temp:  37 C    Last Pain:  Vitals:   01/22/16 2326  PainSc: 3                  Tatjana Turcott K

## 2016-01-23 NOTE — ED Notes (Signed)
Patient observed resting in bed with NAD noted. VSS; see VSFS. No anticipated needs identified at this time. GI with plans to take patient to endo per EDP report; scheduled to go at around 0800. Will continue to monitor.

## 2016-01-23 NOTE — Op Note (Signed)
[facility] Gastroenterology Patient Name: [name] [name] Procedure Date: 01/23/2016 9:38 AM MRN: [id number] Account #: [id number] Date of Birth: Jun 07, 1950 Admit Type: Inpatient Age: 65 Room: [facility] ENDO ROOM 2 Gender: Male Note Status: Finalized Procedure:            Upper GI endoscopy Indications:          Foreign body in the esophagus Providers:            [name] MD, MD Referring MD:         Elveria Rising. Damita Dunnings (Referring MD) Medicines:            General Anesthesia Complications:        No immediate complications. Procedure:            Pre-Anesthesia Assessment:                       - Prior to the procedure, a History and Physical was                        performed, and patient medications and allergies were                        reviewed. The patient's tolerance of previous                        anesthesia was also reviewed. The risks and benefits of                        the procedure and the sedation options and risks were                        discussed with the patient. All questions were                        answered, and informed consent was obtained. Prior                        Anticoagulants: The patient has taken Coumadin                        (warfarin). ASA Grade Assessment: II - A patient with                        mild systemic disease. After reviewing the risks and                        benefits, the patient was deemed in satisfactory                        condition to undergo the procedure.                       After obtaining informed consent, the endoscope was                        passed under direct vision. Throughout the procedure,                        the patient's blood pressure, pulse, and oxygen  saturations were monitored continuously. The Endoscope                        was introduced through the mouth, and advanced to the                        second part of duodenum. The upper GI  endoscopy was                        accomplished without difficulty. The patient tolerated                        the procedure well. Findings:      Food was found in the lower third of the esophagus. Removal of food was       accomplished. No biopsies or other specimens were collected for this       exam.      One moderate benign-appearing, intrinsic stenosis was found in the lower       third of the esophagus. And was traversed.      Localized mild inflammation characterized by erythema was found in the       gastric antrum.      A single 10 mm sessile polyp with no bleeding was found in the ampulla. Impression:           - Food in the lower third of the esophagus. No                        specimens collected. Removal was successful.                       - Benign-appearing esophageal stenosis.                       - Gastritis.                       - A single duodenal polyp. Recommendation:       - Discharge patient to home.                       - Soft diet.                       - Continue present medications.                       - Repeat upper endoscopy off of coumadin for dilation                        and biopsy of ampulla. Procedure Code(s):    --- Professional ---                       [phone], Esophagogastroduodenoscopy, flexible, transoral;                        with removal of foreign body(s) Diagnosis Code(s):    --- Professional ---                       T18.108A, Unspecified foreign body in esophagus causing  other injury, initial encounter                       T18.128A, Food in esophagus causing other injury,                        initial encounter                       K22.2, Esophageal obstruction                       K29.70, Gastritis, unspecified, without bleeding CPT copyright 2016 American Medical Association. All rights reserved. The codes documented in this report are preliminary and upon coder review may  be revised to meet  current compliance requirements. [name] MD, MD 01/23/2016 9:59:44 AM This report has been signed electronically. Number of Addenda: 0 Note Initiated On: 01/23/2016 9:38 AM      [facility]

## 2016-01-23 NOTE — ED Notes (Signed)
Green top tube collected from previously established PIV and sent to lab for MD ordered REPEAT troponin. Patient noted to be belching a lot while RN at bedside. Denies significant pain/discomfort. Spouse sleeping in recliner at bedside. No verbalized needs at this time. Will continue to monitor.

## 2016-01-23 NOTE — ED Notes (Signed)
Patient observed resting in room with NAD noted. His spouse was placed in a recliner previously for comfort. Patient and spouse aware of POC, which includes patient going for an endoscopy later on this morning; Dr. Allen Norris requesting that patient remain in the ED. Patient has no verbalized needs; VSS as charted on VSFS. Will continue to monitor.

## 2016-01-23 NOTE — ED Notes (Signed)
Patient to radiology at this time for MD ordered CT scan.

## 2016-01-23 NOTE — Discharge Instructions (Signed)
You were seen in the ED for having food stuck in your esophagus and you were treated by gastroenterology.  You were not discharged from the emergency department.  However, we are providing this information so that you have more information with which you can follow-up with cardiology.  During your evaluation today we discovered that your EKG has changed somewhat from her prior EKG.  Additionally your troponin was very slightly elevated at 0.06.  However, it stayed elevated at the same level (0.06) over about four hours, and we think this elevation is due to the strain on your body from the on-going esophageal obstruction rather than an acute problem with your heart.  HOWEVER!  It is very important that you follow up either with your own cardiologist, or with Dr. Humphrey Rolls with [facility], at the next available opportunity.  You may need additional outpatient testing.  Also if you are not already taking a daily baby aspirin, you should start doing so until you follow up with cardiology.  If yo develop any new or worsening symptoms, please return immediately to the emergency department.

## 2016-01-23 NOTE — ED Notes (Signed)
RN called to the room by patient's wife who advises that patient needs to go to the restroom and did not want to be asked to be discontinued. RN advised patient to utilize call light any time that he has needs; RN reinforced that this is what we are here for. Patient disconnected and sat up on the side of the bed; denies vertiginous symptoms with position changes. Patient assisted to bathroom just outside of ED4; refused to utilize urinal or room toilet. Patient with steady gait; independently ambulates without difficulty. Patient returned to ED 4 and positioned for comfort. No further verbalized needs. Will continue to monitor.

## 2016-01-24 ENCOUNTER — Encounter: Payer: Self-pay | Admitting: Gastroenterology

## 2016-01-25 ENCOUNTER — Telehealth: Payer: Self-pay

## 2016-01-25 NOTE — Telephone Encounter (Signed)
Thanks

## 2016-01-25 NOTE — Telephone Encounter (Signed)
Patient had an EGD done on 01/23/2016 to have a piece of chicken taken out. While doing this, Dr. Allen Norris saw a polyp but couldn't remove it because he was on Coumadin. His cardiologist needs to know the date he is having the repeat EGD in order to approve it. I looked in the patient's chart and did not see a request form in there sent from [place].   Please call the patient and schedule for a repeat EGD and send a blood thinner request form to his cardiologist.

## 2016-01-25 NOTE — Telephone Encounter (Signed)
Patient's wife called to report that he is going to be scheduled for a procedure for a throat polyp bx in upcoming weeks and will need to be off of Coumadin and bridged with Lovenox.  She and I spoke at length regarding the instructions and she is to call back as soon as she has a date so that dose planning and Lovenox can be called in to their pharmacy.  Patient will need to hold coumadin x 5 days prior and have Lovenox timed with procedure date.  Wife verbalizes understanding and will call me back as soon as she has more details.  Due to patient's hx of heart valve replacement, he will need Lovenox 100mg  bid to bridge tx.  He has used Lovenox before and is familiar with the procedure.  R/X to be sent in to pharmacy once we have more details regarding appointment.

## 2016-01-26 ENCOUNTER — Other Ambulatory Visit: Payer: Self-pay

## 2016-01-26 MED ORDER — ENOXAPARIN SODIUM 100 MG/ML ~~LOC~~ SOLN: 100.0000 mg | Freq: Two times a day (BID) | SUBCUTANEOUS | 0 refills | Status: DC

## 2016-01-26 NOTE — Telephone Encounter (Signed)
Patient has procedure to bx throat polyp on 02/06/16.  He will require a coumadin hold and bridge with Lovenox.  Reviewed recommendations with Dr. Damita Dunnings and Lovenox has been ordered along with set up of dosing schedule.  Patient's wife made aware and she and husband will meet with me on Tuesday to review plan and pick up r/x.

## 2016-01-30 NOTE — Telephone Encounter (Signed)
Agree. Thanks

## 2016-01-30 NOTE — Telephone Encounter (Signed)
Spoke with pt's wife regarding procedure. Pt scheduled an appt with cardiologist to discuss bridging.

## 2016-01-30 NOTE — Telephone Encounter (Signed)
Patient came in today with his wife to review dosing instructions for coumadin/lovenox prior to and after procedure scheduled for 02/06/16.    Patient will have an esophageal polyp bx on 1/9.  He is to follow the following dose schedule:  02/01/16 - take last dose of Coumadin 02/02/16 - take nothing - no coumadin/ no lovenox 02/03/16 - start Lovenox 100mg  bid 02/04/16 - Lovenox 100mg  bid 02/05/16 - Lovenox 100mg  AM ONLY! 02/06/16 - PROCEDURE no coumadin/no lovenox 02/07/16 - resume regular dosing of Coumadin (2mg  M, W, F and 4mg  TUES, THUR SAT, SUN)  And resume Lovenox 100mg  bid  02/08/16 - Coumadin 4mg , Lovenox 100mg  bid 02/09/16 - Coumadin 2mg , Lovenox 100mg  bid 02/10/16 - Coumadin 4mg , Lovenox 100mg  bid 02/11/16 - Coumadin 4mg , Lovenox 100mg  bid  02/12/16 - Appt to return to coumadin clinic at 9am to review dosing before taking coumadin or lovenox.  Will dose from there.    Written calendar given reviewing dosing schedule.  Both wife and patient verbalize understanding of all instructions.  Patient has been on lovenox before and is very familiar with administration technique.

## 2016-02-05 ENCOUNTER — Ambulatory Visit: Payer: Medicare Other

## 2016-02-06 ENCOUNTER — Ambulatory Visit: Payer: Medicare Other | Admitting: Registered Nurse

## 2016-02-06 ENCOUNTER — Encounter
Admission: RE | Disposition: A | Discharge status: Home / Self Care | Payer: Self-pay | Source: Ambulatory Visit | Attending: Gastroenterology

## 2016-02-06 ENCOUNTER — Ambulatory Visit
Admission: RE | Admit: 2016-02-06 | Discharge: 2016-02-06 | Disposition: A | Discharge status: Home / Self Care | Payer: Medicare Other | Source: Ambulatory Visit | Attending: Gastroenterology | Admitting: Gastroenterology

## 2016-02-06 DIAGNOSIS — K2970 Gastritis, unspecified, without bleeding: Secondary | ICD-10-CM

## 2016-02-06 DIAGNOSIS — K3189 Other diseases of stomach and duodenum: Secondary | ICD-10-CM | POA: Insufficient documentation

## 2016-02-06 DIAGNOSIS — K222 Esophageal obstruction: Secondary | ICD-10-CM | POA: Diagnosis not present

## 2016-02-06 DIAGNOSIS — R1310 Dysphagia, unspecified: Secondary | ICD-10-CM | POA: Diagnosis not present

## 2016-02-06 DIAGNOSIS — K2950 Unspecified chronic gastritis without bleeding: Secondary | ICD-10-CM | POA: Insufficient documentation

## 2016-02-06 DIAGNOSIS — Z79899 Other long term (current) drug therapy: Secondary | ICD-10-CM | POA: Diagnosis not present

## 2016-02-06 DIAGNOSIS — Z952 Presence of prosthetic heart valve: Secondary | ICD-10-CM | POA: Insufficient documentation

## 2016-02-06 DIAGNOSIS — I110 Hypertensive heart disease with heart failure: Secondary | ICD-10-CM | POA: Diagnosis not present

## 2016-02-06 DIAGNOSIS — K209 Esophagitis, unspecified without bleeding: Secondary | ICD-10-CM

## 2016-02-06 DIAGNOSIS — K210 Gastro-esophageal reflux disease with esophagitis: Secondary | ICD-10-CM | POA: Insufficient documentation

## 2016-02-06 DIAGNOSIS — I509 Heart failure, unspecified: Secondary | ICD-10-CM | POA: Insufficient documentation

## 2016-02-06 DIAGNOSIS — Z7901 Long term (current) use of anticoagulants: Secondary | ICD-10-CM | POA: Diagnosis not present

## 2016-02-06 DIAGNOSIS — E119 Type 2 diabetes mellitus without complications: Secondary | ICD-10-CM | POA: Insufficient documentation

## 2016-02-06 HISTORY — PX: ESOPHAGOGASTRODUODENOSCOPY (EGD) WITH PROPOFOL: SHX5813

## 2016-02-06 LAB — GLUCOSE, CAPILLARY: Glucose-Capillary: 155 mg/dL — ABNORMAL HIGH (ref 65–99)

## 2016-02-06 SURGERY — ESOPHAGOGASTRODUODENOSCOPY (EGD) WITH PROPOFOL
Anesthesia: General

## 2016-02-06 MED ORDER — LIDOCAINE HCL (PF) 2 % IJ SOLN
INTRAMUSCULAR | Status: DC | PRN
  Administered 2016-02-06: 100 mg via INTRADERMAL

## 2016-02-06 MED ORDER — PROPOFOL 500 MG/50ML IV EMUL
INTRAVENOUS | Status: AC
  Filled 2016-02-06: qty 50

## 2016-02-06 MED ORDER — SODIUM CHLORIDE 0.9 % IV SOLN
INTRAVENOUS | Status: DC
  Administered 2016-02-06: 09:00:00 via INTRAVENOUS

## 2016-02-06 MED ORDER — LIDOCAINE 2% (20 MG/ML) 5 ML SYRINGE
INTRAMUSCULAR | Status: AC
  Filled 2016-02-06: qty 5

## 2016-02-06 MED ORDER — CEFAZOLIN SODIUM-DEXTROSE 2-4 GM/100ML-% IV SOLN
2.0000 g | Freq: Three times a day (TID) | INTRAVENOUS | Status: DC
  Administered 2016-02-06: 2 g via INTRAVENOUS

## 2016-02-06 MED ORDER — PROPOFOL 500 MG/50ML IV EMUL
INTRAVENOUS | Status: DC | PRN
  Administered 2016-02-06: 150 ug/kg/min via INTRAVENOUS

## 2016-02-06 NOTE — Anesthesia Preprocedure Evaluation (Signed)
Anesthesia Evaluation  Patient identified by MRN, date of birth, ID band Patient awake    Reviewed: Allergy & Precautions, H&P , NPO status , Patient's Chart, lab work & pertinent test results, reviewed documented beta blocker date and time   Airway Mallampati: III   Neck ROM: full    Dental  (+) Poor Dentition   Pulmonary neg pulmonary ROS, shortness of breath, sleep apnea , pneumonia,    Pulmonary exam normal        Cardiovascular hypertension, +CHF  negative cardio ROS Normal cardiovascular exam Rhythm:regular Rate:Normal     Neuro/Psych negative neurological ROS  negative psych ROS   GI/Hepatic negative GI ROS, Neg liver ROS, PUD, GERD  Medicated,  Endo/Other  negative endocrine ROSdiabetes  Renal/GU negative Renal ROS  negative genitourinary   Musculoskeletal   Abdominal   Peds  Hematology negative hematology ROS (+) anemia ,   Anesthesia Other Findings Past Medical History: No date: Acute pancreatitis     Comment: with pancreatic necrosis No date: Anemia No date: Aortic insufficiency     Comment: with bicuspid aortic valve (the last MRI               demonstrated the aortic root to be 4.8 x 4.7               cm.) Mod regurgitation. Normal chamber size               with very mild left ventricular dysfunction.) No date: Blood transfusion No date: CHF (congestive heart failure) ([place]) 06/28/2009: Cough     Comment: Dr. Melvyn Novas No date: Diabetes mellitus without complication ([place]) No date: Duodenal ulcer     Comment: with GI bleeding No date: Dyslipidemia 03/23/2001: GERD (gastroesophageal reflux disease)     Comment: egd w/ dilation...Marland KitchenMarland KitchenDeatra Ina No date: Hemorrhoids No date: Hypertension 09/2009: Necrotizing pancreatitis     Comment: admission, and pseudocyst formation, also with              ATN 08/2009: Orthostasis     Comment: Fever and syncope, no source for fever on               cultures. 07/2009:  Pericardial effusion     Comment: admission to [facility], s/p pericardial window 10/2009: Pleural effusion     Comment: s/p thoracentesis 10/2009: PNA (pneumonia)     Comment: admnission to [facility], with right pleural               effusion, s/p thoracentesis 2011: Pneumonia     Comment: "twice" 08/2009: Syncope     Comment: admission for fever and syncope, no source for              fever seen on cultures. Syncope thought to be               r/t orthostasis Past Surgical History: 07/10/2009: CARDIAC VALVE REPLACEMENT     Comment: AVR 03/10/2003: Cardiolite EKG     Comment: (-) ? small/apical infarct EF 50% 01/18/97: DOPPLER ECHOCARDIOGRAPHY     Comment: LVH, mild dilated aorta - root mod to severe               aortic regurg EF 50-55% 09/27/03: DOPPLER ECHOCARDIOGRAPHY     Comment: No changes, moderate severe aortic regurg 07/09/2004: DOPPLER ECHOCARDIOGRAPHY     Comment: Mod severe aortic regurg 2-3+ T.R., T.I.R.,               mild P..R. 04/01/06: DOPPLER ECHOCARDIOGRAPHY  Comment: Hypokin Apex EF 55% 02/15/2010: DOPPLER ECHOCARDIOGRAPHY     Comment: Mild LVH, mild decr Sys fctn EF 40-45% Mild               Dias Dysfctn Mech AV Triv AR, MR 1990, 1999: ESOPHAGOGASTRODUODENOSCOPY     Comment: Hot dog in throat (1987)/ Chicken in throat               (1990)/ 01/06/97 (Dr. Earlean Shawl) Impaction,               dilatation, Schatzki's Ring/ 07/1998 repeat               Dilatation/ 01/10/01 stricture/dilated/ 03/08/05               Schatzki's Ring, dilated/ 06/01/08 Food               Disimpaction [name] Bulb Ulcer (Dr. Watt Climes) 01/06/1997: ESOPHAGOGASTRODUODENOSCOPY     Comment: (Dr. Earlean Shawl) Impaction, dilatation, Schatzki's              Ring 08/09/98: ESOPHAGOGASTRODUODENOSCOPY     Comment: Repeat dilatation Deatra Ina) 01/13/2001: ESOPHAGOGASTRODUODENOSCOPY     Comment: stricture/ HH, duodenal ulcer / bleeding               Henrene Pastor) 12/25/01: ESOPHAGOGASTRODUODENOSCOPY     Comment: esophagus  stricture, dilated 03/23/2001: ESOPHAGOGASTRODUODENOSCOPY     Comment: stricture, dilated 03/08/05: ESOPHAGOGASTRODUODENOSCOPY     Comment: Schatzki's Ring   03/13/05  Schatzki's Ring -               dilated, duodenitis 06/11/08: ESOPHAGOGASTRODUODENOSCOPY     Comment: with food disimpaction.  Food impaction Ms [place]               Sm bulb ulcer (Dr. Watt Climes) No date: ESOPHAGOGASTRODUODENOSCOPY     Comment: stricture/HH, duodenal ulcer/bleeding Henrene Pastor) 01/23/2016: ESOPHAGOGASTRODUODENOSCOPY N/A     Comment: Procedure: ESOPHAGOGASTRODUODENOSCOPY (EGD);                Surgeon: [name], MD;  Location: [facility]               ENDOSCOPY;  Service: Endoscopy;  Laterality:               N/A; 2011: HERNIA REPAIR     Comment: abdominal; post AVR 2/05: MRI     Comment: LVH global hypokin EF 48% Mod A.I. 06/2009: VALVE REPLACEMENT     Comment: St. Jude mechanical valve per Dr. Cyndia Bent BMI    Body Mass Index:  33.89 kg/m     Reproductive/Obstetrics negative OB ROS                             Anesthesia Physical Anesthesia Plan  ASA: III  Anesthesia Plan: General   Post-op Pain Management:    Induction:   Airway Management Planned:   Additional Equipment:   Intra-op Plan:   Post-operative Plan:   Informed Consent: I have reviewed the patients History and Physical, chart, labs and discussed the procedure including the risks, benefits and alternatives for the proposed anesthesia with the patient or authorized representative who has indicated his/her understanding and acceptance.   Dental Advisory Given  Plan Discussed with: CRNA  Anesthesia Plan Comments:         Anesthesia Quick Evaluation

## 2016-02-06 NOTE — H&P (Signed)
[name], MD [facility] [address], [place], [place] [address] Phone: [phone] Fax : [phone]  Primary Care Physician:  Elsie Stain, MD Primary Gastroenterologist:  Dr. Allen Norris  Pre-Procedure History & Physical: HPI:  [name] [name] is a 66 y.o. male is here for an endoscopy.   Past Medical History:  Diagnosis Date  . Acute pancreatitis    with pancreatic necrosis  . Anemia   . Aortic insufficiency    with bicuspid aortic valve (the last MRI demonstrated the aortic root to be 4.8 x 4.7 cm.) Mod regurgitation. Normal chamber size with very mild left ventricular dysfunction.)  . Blood transfusion   . CHF (congestive heart failure) ([place])   . Cough 06/28/2009   Dr. Melvyn Novas  . Diabetes mellitus without complication ([place])   . Duodenal ulcer    with GI bleeding  . Dyslipidemia   . GERD (gastroesophageal reflux disease) 03/23/2001   egd w/ dilation...Marland KitchenMarland KitchenDeatra Ina  . Hemorrhoids   . Hypertension   . 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 Orthostasis 08/2009   Fever and syncope, no source for fever on cultures.  . Pericardial effusion 07/2009   admission to [facility] s/p pericardial window  . Pleural effusion 10/2009   s/p thoracentesis  . PNA (pneumonia) 10/2009   admnission to [facility], with right pleural effusion, s/p thoracentesis  . Pneumonia 2011   "twice"  . Syncope 08/2009   admission for fever and syncope, no source for fever seen on cultures. Syncope thought to be r/t orthostasis    Past Surgical History:  Procedure Laterality Date  . 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 Cardiolite EKG  03/10/2003   (-) ? small/apical infarct EF 50%  . DOPPLER ECHOCARDIOGRAPHY  01/18/97   LVH, mild dilated aorta - root mod to severe aortic regurg EF 50-55%  . DOPPLER ECHOCARDIOGRAPHY  09/27/03   No changes, moderate severe aortic regurg  . DOPPLER ECHOCARDIOGRAPHY  07/09/2004   Mod severe aortic regurg 2-3+ T.R., T.I.R.,  mild P..R.  . DOPPLER ECHOCARDIOGRAPHY  04/01/06   Hypokin Apex EF 55%  . DOPPLER ECHOCARDIOGRAPHY  02/15/2010   Mild LVH, mild decr Sys fctn EF 40-45% Mild Dias Dysfctn Mech AV Triv AR, MR  . ESOPHAGOGASTRODUODENOSCOPY  1990, 1999   Hot dog in throat (1987)/ Chicken in throat (1990)/ 01/06/97 (Dr. Earlean Shawl) Impaction, dilatation, Schatzki's Ring/ 07/1998 repeat Dilatation/ 01/10/01 stricture/dilated/ 03/08/05 Schatzki's Ring, dilated/ 06/01/08 Food Disimpaction [name] Bulb Ulcer (Dr. Watt Climes)  . ESOPHAGOGASTRODUODENOSCOPY  01/06/1997   (Dr. Earlean Shawl) Impaction, dilatation, Schatzki's Ring  . ESOPHAGOGASTRODUODENOSCOPY  08/09/98   Repeat dilatation Deatra Ina)  . ESOPHAGOGASTRODUODENOSCOPY  01/13/2001   stricture/ HH, duodenal ulcer / bleeding Henrene Pastor)  . ESOPHAGOGASTRODUODENOSCOPY  12/25/01   esophagus stricture, dilated  . ESOPHAGOGASTRODUODENOSCOPY  03/23/2001   stricture, dilated  . ESOPHAGOGASTRODUODENOSCOPY  03/08/05   Schatzki's Ring   03/13/05  Schatzki's Ring - dilated, duodenitis  . ESOPHAGOGASTRODUODENOSCOPY  06/11/08   with food disimpaction.  Food impaction Ms [place]  Sm bulb ulcer (Dr. Watt Climes)  . ESOPHAGOGASTRODUODENOSCOPY     stricture/HH, duodenal ulcer/bleeding Henrene Pastor)  . ESOPHAGOGASTRODUODENOSCOPY N/A 01/23/2016   Procedure: ESOPHAGOGASTRODUODENOSCOPY (EGD);  Surgeon: [name], MD;  Location[facility] ENDOSCOPY;  Service: Endoscopy;  Laterality: N/A;  . HERNIA REPAIR  2011   abdominal; post AVR  . MRI  2/05   LVH global hypokin EF 48% Mod A.I.  . VALVE REPLACEMENT  06/2009   St. Jude  mechanical valve per Dr. Cyndia Bent    Prior to Admission medications   Medication Sig Start Date End Date Taking? Authorizing Provider  ACCU-CHEK FASTCLIX LANCETS MISC USE AS DIRECTED Dx E11.9 05/18/15  Yes Tonia Ghent, MD  amoxicillin (AMOXIL) 500 MG tablet Take 2,000 mg by mouth once. Before dental visits.   Yes Historical Provider, MD  azelastine (ASTELIN) 0.1 % nasal spray Place 2 sprays into both  nostrils 2 (two) times daily as needed for rhinitis. Use in each nostril as directed   Yes Historical Provider, MD  Blood Glucose Monitoring Suppl (ACCU-CHEK AVIVA PLUS) W/DEVICE KIT 1 kit by Does not apply route once. 11/05/11  Yes Tonia Ghent, MD  carvedilol (COREG) 6.25 MG tablet Take 1 tablet (6.25 mg total) by mouth 2 (two) times daily. 09/07/15 09/06/16 Yes Minus Breeding, MD  diphenhydrAMINE (BENADRYL) 25 MG tablet Take 25 mg by mouth every 6 (six) hours as needed for itching.    Yes Historical Provider, MD  enoxaparin (LOVENOX) 100 MG/ML injection Inject 1 mL (100 mg total) into the skin every 12 (twelve) hours. 01/26/16  Yes Tonia Ghent, MD  furosemide (LASIX) 20 MG tablet Take 1 tablet (20 mg total) by mouth daily as needed. For feet swelling PLEASE CONTACT OFFICE FOR ADDITIONAL REFILLS 12/12/15  Yes Minus Breeding, MD  losartan (COZAAR) 50 MG tablet Take 1 tablet (50 mg total) by mouth daily. 05/15/15  Yes Tonia Ghent, MD  metFORMIN (GLUCOPHAGE) 500 MG tablet TAKE UP TO 2 TABLETS BY MOUTH TWICE DAILY 11/28/15  Yes Tonia Ghent, MD  potassium chloride (K-DUR) 10 MEQ tablet TAKE 1 TABLET BY MOUTH TWICE A DAY 12/12/15  Yes Tonia Ghent, MD  triamcinolone cream (KENALOG) 0.5 % APPLY TO SKIN TWICE DAILY AS NEEDED 08/11/15  Yes Tonia Ghent, MD  warfarin (COUMADIN) 1 MG tablet TAKE AS DIRECTED BY ANTICOAGULATION CLINIC 09/27/15  Yes Tonia Ghent, MD  warfarin (COUMADIN) 2 MG tablet Take 2 mg by mouth every Monday, Wednesday, and Friday.   Yes Historical Provider, MD  warfarin (COUMADIN) 4 MG tablet Take 4 mg by mouth every Tuesday, Thursday, Saturday, and Sunday at 6 PM.   Yes Historical Provider, MD  docusate sodium (COLACE) 100 MG capsule Take 100 mg by mouth every other day.     Historical Provider, MD  glucose blood (ACCU-CHEK AVIVA PLUS) test strip USE AS DIRECTED TO CHECK BLOOD SUGAR 2 TO 4 TIMES A DAY.  Diagnosis:  E11.9    Non-insulin dependent. 12/29/14   Tonia Ghent,  MD    Allergies as of 01/26/2016 - Review Complete 01/24/2016  Allergen Reaction Noted  . Ace inhibitors    . Primaxin [imipenem w/cilastatin sodium]  06/07/2010    Family History  Problem Relation Age of Onset  . Obesity Mother     bedridden  . Hypertension Mother   . Heart disease Father     CABG, CAD  . Hypertension Father   . Lymphoma Father   . Cancer Father     lymphoma  . Alcohol abuse Brother   . Cancer Brother     melanoma  . Colon cancer Neg Hx   . Stomach cancer Neg Hx   . Rectal cancer Neg Hx   . Prostate cancer Neg Hx     Social History   Social History  . Marital status: Married    Spouse name: N/A  . Number of children: 2  . Years of education: N/A  Occupational History  . mech. supervisor [employer]   Social History Main Topics  . Smoking status: Never Smoker  . Smokeless tobacco: Never Used  . Alcohol use No  . Drug use: No  . Sexual activity: Not on file   Other Topics Concern  . Not on file   Social History Narrative   Married 1996    Review of Systems: See HPI, otherwise negative ROS  Physical Exam: There were no vitals taken for this visit. General:   Alert,  pleasant and cooperative in NAD Head:  Normocephalic and atraumatic. Neck:  Supple; no masses or thyromegaly. Lungs:  Clear throughout to auscultation.    Heart:  Regular rate and rhythm. Abdomen:  Soft, nontender and nondistended. Normal bowel sounds, without guarding, and without rebound.   Neurologic:  Alert and  oriented x4;  grossly normal neurologically.  Impression/Plan: RAMEY SCHIFF is here for an endoscopy to be performed for esophageal stricture and small bowel polyp  Risks, benefits, limitations, and alternatives regarding  endoscopy have been reviewed with the patient.  Questions have been answered.  All parties agreeable.   [name], MD  02/06/2016, 9:20 AM

## 2016-02-06 NOTE — Anesthesia Procedure Notes (Signed)
Performed by: Hedda Slade

## 2016-02-06 NOTE — Op Note (Signed)
[facility] Patient Name: [name] [name] Procedure Date: 02/06/2016 9:58 AM MRN: [id number] Account #: [id number] Date of Birth: 1950-11-01 Admit Type: Outpatient Age: 66 Room: [facility] ENDO ROOM 4 Gender: Male Note Status: Finalized Procedure:            Upper GI endoscopy Indications:          Dysphagia, Patient with large ampulla and esophagaeal                        stricure. The patient was on anticoagulation at last                        procedure. Providers:            [name] MD, MD Referring MD:         Elveria Rising. Damita Dunnings (Referring MD) Medicines:            Propofol per Anesthesia Complications:        No immediate complications. Procedure:            Pre-Anesthesia Assessment:                       - Prior to the procedure, a History and Physical was                        performed, and patient medications and allergies were                        reviewed. The patient's tolerance of previous                        anesthesia was also reviewed. The risks and benefits of                        the procedure and the sedation options and risks were                        discussed with the patient. All questions were                        answered, and informed consent was obtained. Prior                        Anticoagulants: The patient has taken Coumadin                        (warfarin). ASA Grade Assessment: III - A patient with                        severe systemic disease. After reviewing the risks and                        benefits, the patient was deemed in satisfactory                        condition to undergo the procedure.                       After obtaining informed consent, the endoscope was  passed under direct vision. Throughout the procedure,                        the patient's blood pressure, pulse, and oxygen                        saturations were monitored continuously. The             Colonoscope was introduced through the mouth, and                        advanced to the second part of duodenum. The upper GI                        endoscopy was accomplished without difficulty. The                        patient tolerated the procedure well. Findings:      One moderate benign-appearing, intrinsic stenosis was found at the       gastroesophageal junction. And was traversed. A TTS dilator was passed       through the scope. Dilation with a 12-13.5-15 mm balloon dilator was       performed to 15 mm. The dilation site was examined following endoscope       reinsertion and showed complete resolution of luminal narrowing.      LA Grade B (one or more mucosal breaks greater than 5 mm, not extending       between the tops of two mucosal folds) esophagitis with no bleeding was       found at the gastroesophageal junction. Biopsies were taken with a cold       forceps for histology.      Localized moderate inflammation characterized by erythema was found in       the gastric antrum. Biopsies were taken with a cold forceps for       histology.      Diffuse moderately congested mucosa without active bleeding and with no       stigmata of bleeding was found in the duodenal bulb. This was biopsied       with a cold forceps for histology.      Localized moderate mucosal changes were found in the ampulla. Biopsies       were taken with a cold forceps for histology. Impression:           - Benign-appearing esophageal stenosis. Dilated.                       - LA Grade B esophagitis. Biopsied.                       - Gastritis. Biopsied.                       - Congested duodenal mucosa. Biopsied.                       - Mucosal changes in the duodenum. Biopsied. Recommendation:       - Await pathology results.                       - Discharge patient to home.                       -  Resume previous diet.                       - Continue present medications.                        - Await pathology results.                       - Watch for fevers, chills, chest pain or shortness of                        breath. If any of these occur then go to ER. Procedure Code(s):    --- Professional ---                       [phone], Esophagogastroduodenoscopy, flexible, transoral;                        with transendoscopic balloon dilation of esophagus                        (less than 30 mm diameter)                       43239, Esophagogastroduodenoscopy, flexible, transoral;                        with biopsy, single or multiple Diagnosis Code(s):    --- Professional ---                       R13.10, Dysphagia, unspecified                       K22.2, Esophageal obstruction                       K20.9, Esophagitis, unspecified                       K29.70, Gastritis, unspecified, without bleeding CPT copyright 2016 American Medical Association. All rights reserved. The codes documented in this report are preliminary and upon coder review may  be revised to meet current compliance requirements. [name] MD, MD 02/06/2016 10:19:41 AM This report has been signed electronically. Number of Addenda: 0 Note Initiated On: 02/06/2016 9:58 AM      [facility]

## 2016-02-06 NOTE — OR Nursing (Signed)
PT WITH IMIPENEM ALLERGY. RN SPOKE WITH DR ADAMS AND DR WOHL WHO CLEARED PT TO HAVE KEFZOL 2GM IV PROPHYLACTIC D/T MECHANICAL VALVE

## 2016-02-06 NOTE — Transfer of Care (Signed)
Immediate Anesthesia Transfer of Care Note  Patient: [name] [name]  Procedure(s) Performed: Procedure(s): ESOPHAGOGASTRODUODENOSCOPY (EGD) WITH PROPOFOL (N/A)  Patient Location: PACU  Anesthesia Type:General  Level of Consciousness: sedated  Airway & Oxygen Therapy: Patient Spontanous Breathing and Patient connected to nasal cannula oxygen  Post-op Assessment: Report given to RN and Post -op Vital signs reviewed and stable  Post vital signs: Reviewed and stable  Last Vitals:  Vitals:   02/06/16 0928 02/06/16 1019  BP: (!) 154/67 109/61  Pulse: (!) 54 (!) 56  Resp: 20 14  Temp: 36.4 C 36.4 C    Last Pain:  Vitals:   02/06/16 1019  TempSrc: Tympanic  PainSc: Asleep         Complications: No apparent anesthesia complications

## 2016-02-07 ENCOUNTER — Encounter: Payer: Self-pay | Admitting: Gastroenterology

## 2016-02-07 LAB — SURGICAL PATHOLOGY

## 2016-02-09 ENCOUNTER — Ambulatory Visit: Payer: Medicare Other

## 2016-02-09 NOTE — Anesthesia Postprocedure Evaluation (Signed)
Anesthesia Post Note  Patient: CONWELL VITO  Procedure(s) Performed: Procedure(s) (LRB): ESOPHAGOGASTRODUODENOSCOPY (EGD) WITH PROPOFOL (N/A)  Patient location during evaluation: PACU Anesthesia Type: General Level of consciousness: awake and alert Pain management: pain level controlled Vital Signs Assessment: post-procedure vital signs reviewed and stable Respiratory status: spontaneous breathing, nonlabored ventilation, respiratory function stable and patient connected to nasal cannula oxygen Cardiovascular status: blood pressure returned to baseline and stable Postop Assessment: no signs of nausea or vomiting Anesthetic complications: no     Last Vitals:  Vitals:   02/06/16 1048 02/06/16 1058  BP: 115/65 114/68  Pulse: (!) 54 (!) 54  Resp: 13 16  Temp:      Last Pain:  Vitals:   02/06/16 1028  TempSrc:   PainSc: 0-No pain                 Molli Barrows

## 2016-02-10 ENCOUNTER — Other Ambulatory Visit: Payer: Self-pay | Admitting: Family Medicine

## 2016-02-12 ENCOUNTER — Telehealth: Payer: Self-pay

## 2016-02-12 ENCOUNTER — Ambulatory Visit (INDEPENDENT_AMBULATORY_CARE_PROVIDER_SITE_OTHER): Payer: Medicare Other

## 2016-02-12 ENCOUNTER — Other Ambulatory Visit: Payer: Self-pay

## 2016-02-12 DIAGNOSIS — Z952 Presence of prosthetic heart valve: Secondary | ICD-10-CM

## 2016-02-12 DIAGNOSIS — Z7901 Long term (current) use of anticoagulants: Secondary | ICD-10-CM | POA: Diagnosis not present

## 2016-02-12 DIAGNOSIS — Z5181 Encounter for therapeutic drug level monitoring: Secondary | ICD-10-CM | POA: Diagnosis not present

## 2016-02-12 DIAGNOSIS — I4891 Unspecified atrial fibrillation: Secondary | ICD-10-CM | POA: Diagnosis not present

## 2016-02-12 LAB — POCT INR: INR: 1.4

## 2016-02-12 MED ORDER — ENOXAPARIN SODIUM 100 MG/ML ~~LOC~~ SOLN: 100.0000 mg | Freq: Two times a day (BID) | SUBCUTANEOUS | 0 refills | Status: DC

## 2016-02-12 NOTE — Patient Instructions (Signed)
Pre visit review using our clinic review tool, if applicable. No additional management support is needed unless otherwise documented below in the visit note.   INR today 1.4  Patient is currently on Lovenox 100mg  bid following a procedure last week where he held his coumadin X 5 days.  He did resume regular coumadin dosing approximately 5 days ago in addition to Lovenox and came in today for check and further instructions.  He will continue with Lovenox injections until back up to goal range.  Patient to increase coumadin dose today (4mg ) 1/15 and tomorrow (5mg ) 1/16 and then resume prior dosing schedule of 4mg  daily except for 2mg  on Mon, Wed, Friday.  He will come back for a recheck this Friday for further oversight and management.  Patient educated on risks associated with subtherapeutic level and verbalizes understanding of all instructions given today.

## 2016-02-12 NOTE — Telephone Encounter (Signed)
-----   Message from [name], MD sent at 02/11/2016  8:22 AM EST ----- The patient know that although the findings on the upper endoscopy showed enlargement of the tissue biopsied, the biopsy showed no cancerous or precancerous findings.

## 2016-02-12 NOTE — Progress Notes (Signed)
Agree.  Thanks.  I can't close out this note so I routed it back to you.  Thanks.

## 2016-02-12 NOTE — Telephone Encounter (Signed)
I spoke with patient's wife and reviewed coumadin dosing and recheck schedule for the week.  I did reschedule patient's recheck appointment for the coumadin clinic for this Thursday (1/18) and cancelled lab for 02/16/16.  Wife verbalizes understanding and aware that I will refill #10 Lovenox syringes to carry patient through the week.

## 2016-02-12 NOTE — Telephone Encounter (Signed)
Patient returned your regarding results

## 2016-02-12 NOTE — Telephone Encounter (Signed)
Pt's wife notified of results. 

## 2016-02-12 NOTE — Telephone Encounter (Signed)
LVM for pt to return my call.

## 2016-02-15 ENCOUNTER — Ambulatory Visit: Payer: Medicare Other

## 2016-02-16 ENCOUNTER — Other Ambulatory Visit: Payer: Medicare Other

## 2016-02-16 ENCOUNTER — Ambulatory Visit (INDEPENDENT_AMBULATORY_CARE_PROVIDER_SITE_OTHER): Payer: Medicare Other

## 2016-02-16 DIAGNOSIS — Z952 Presence of prosthetic heart valve: Secondary | ICD-10-CM

## 2016-02-16 DIAGNOSIS — Z5181 Encounter for therapeutic drug level monitoring: Secondary | ICD-10-CM

## 2016-02-16 LAB — POCT INR: INR: 1.6

## 2016-02-16 NOTE — Patient Instructions (Signed)
Pre visit review using our clinic review tool, if applicable. No additional management support is needed unless otherwise documented below in the visit note. 

## 2016-02-16 NOTE — Progress Notes (Signed)
Agree. Thanks

## 2016-02-19 ENCOUNTER — Ambulatory Visit (INDEPENDENT_AMBULATORY_CARE_PROVIDER_SITE_OTHER): Payer: Medicare Other

## 2016-02-19 DIAGNOSIS — Z5181 Encounter for therapeutic drug level monitoring: Secondary | ICD-10-CM

## 2016-02-19 DIAGNOSIS — Z952 Presence of prosthetic heart valve: Secondary | ICD-10-CM

## 2016-02-19 DIAGNOSIS — Z7901 Long term (current) use of anticoagulants: Secondary | ICD-10-CM

## 2016-02-19 DIAGNOSIS — I4891 Unspecified atrial fibrillation: Secondary | ICD-10-CM

## 2016-02-19 LAB — POCT INR: INR: 2.3

## 2016-02-19 NOTE — Progress Notes (Signed)
Agree. Thanks

## 2016-02-19 NOTE — Patient Instructions (Signed)
Pre visit review using our clinic review tool, if applicable. No additional management support is needed unless otherwise documented below in the visit note. 

## 2016-02-26 ENCOUNTER — Other Ambulatory Visit: Payer: Self-pay | Admitting: Family Medicine

## 2016-02-26 DIAGNOSIS — E119 Type 2 diabetes mellitus without complications: Secondary | ICD-10-CM

## 2016-02-27 ENCOUNTER — Other Ambulatory Visit (INDEPENDENT_AMBULATORY_CARE_PROVIDER_SITE_OTHER): Payer: Medicare Other

## 2016-02-27 DIAGNOSIS — E119 Type 2 diabetes mellitus without complications: Secondary | ICD-10-CM | POA: Diagnosis not present

## 2016-02-27 LAB — HEMOGLOBIN A1C: HEMOGLOBIN A1C: 8.3 % — AB (ref 4.6–6.5)

## 2016-03-01 ENCOUNTER — Ambulatory Visit (INDEPENDENT_AMBULATORY_CARE_PROVIDER_SITE_OTHER): Payer: Medicare Other

## 2016-03-01 ENCOUNTER — Encounter: Payer: Self-pay | Admitting: Family Medicine

## 2016-03-01 ENCOUNTER — Ambulatory Visit: Payer: Medicare Other

## 2016-03-01 ENCOUNTER — Other Ambulatory Visit: Payer: Self-pay | Admitting: Cardiology

## 2016-03-01 ENCOUNTER — Ambulatory Visit (INDEPENDENT_AMBULATORY_CARE_PROVIDER_SITE_OTHER): Payer: Medicare Other | Admitting: Family Medicine

## 2016-03-01 DIAGNOSIS — Z7901 Long term (current) use of anticoagulants: Secondary | ICD-10-CM | POA: Diagnosis not present

## 2016-03-01 DIAGNOSIS — E119 Type 2 diabetes mellitus without complications: Secondary | ICD-10-CM

## 2016-03-01 DIAGNOSIS — Z952 Presence of prosthetic heart valve: Secondary | ICD-10-CM | POA: Diagnosis not present

## 2016-03-01 DIAGNOSIS — I4891 Unspecified atrial fibrillation: Secondary | ICD-10-CM | POA: Diagnosis not present

## 2016-03-01 DIAGNOSIS — Z5181 Encounter for therapeutic drug level monitoring: Secondary | ICD-10-CM | POA: Diagnosis not present

## 2016-03-01 LAB — POCT INR: INR: 2.4

## 2016-03-01 MED ORDER — FUROSEMIDE 20 MG PO TABS: ORAL_TABLET | ORAL | 0 refills | Status: DC

## 2016-03-01 MED ORDER — WARFARIN SODIUM 3 MG PO TABS: 3.0000 mg | ORAL_TABLET | ORAL | Status: DC

## 2016-03-01 MED ORDER — GLIPIZIDE 5 MG PO TABS: 2.5000 mg | ORAL_TABLET | Freq: Every day | ORAL | 3 refills | Status: DC

## 2016-03-01 NOTE — Progress Notes (Signed)
Pre visit review using our clinic review tool, if applicable. No additional management support is needed unless otherwise documented below in the visit note. 

## 2016-03-01 NOTE — Patient Instructions (Signed)
Pre visit review using our clinic review tool, if applicable. No additional management support is needed unless otherwise documented below in the visit note. 

## 2016-03-01 NOTE — Patient Instructions (Signed)
Keep working on diet and weight.   Add on glipizide, 1/2 tab initially.  If sugar still >130, then go up to 1 tab.  Update me as needed.  Recheck in about 3 months, labs ahead of time.  Take care.  Glad to see you.

## 2016-03-01 NOTE — Telephone Encounter (Signed)
Rx(s) sent to pharmacy electronically.  

## 2016-03-01 NOTE — Progress Notes (Signed)
+  See anticoagulation note.   Cough.  Started about 5 days.  No fevers.  No vomiting.  No aches.  No sputum.  Some possible wheeze.  It doesn't feel unwell, just the cough.  Alka seltzer helps some.  Tessalon helps some, he has some of those to use.    Diabetes:  Using medications without difficulties:yes Hypoglycemic episodes:no Hyperglycemic episodes:no, lowest sugar was 120 Feet problems: no Blood Sugars averaging: 150-200 eye exam within last year: yes A1c still up, d/w pt.  Still 8.3, same as prev.  He joined the gym.   He admits to diet being off.  D/w pt.    Meds, vitals, and allergies reviewed.   ROS: Per HPI unless specifically indicated in ROS section   GEN: nad, alert and oriented HEENT: mucous membranes moist, TM wnl NECK: supple w/o LA CV: rrr. PULM: ctab, no inc wob ABD: soft, +bs, no focal dec in BS EXT: no edema

## 2016-03-03 NOTE — Progress Notes (Signed)
Agree. Thanks

## 2016-03-03 NOTE — Assessment & Plan Note (Signed)
Keep working on diet and weight.   Add on glipizide, 1/2 tab initially.  If sugar still >130, then go up to 1 tab.  He'll update me as needed.  Recheck in about 3 months, labs ahead of time.   His lungs are clear.  He'll update me if the cough isn't better, okay to use tessalon prn.

## 2016-03-05 ENCOUNTER — Ambulatory Visit: Payer: Medicare Other

## 2016-03-29 ENCOUNTER — Ambulatory Visit (INDEPENDENT_AMBULATORY_CARE_PROVIDER_SITE_OTHER): Payer: Medicare Other

## 2016-03-29 DIAGNOSIS — Z5181 Encounter for therapeutic drug level monitoring: Secondary | ICD-10-CM | POA: Diagnosis not present

## 2016-03-29 DIAGNOSIS — Z7901 Long term (current) use of anticoagulants: Secondary | ICD-10-CM | POA: Diagnosis not present

## 2016-03-29 DIAGNOSIS — Z952 Presence of prosthetic heart valve: Secondary | ICD-10-CM

## 2016-03-29 DIAGNOSIS — I4891 Unspecified atrial fibrillation: Secondary | ICD-10-CM

## 2016-03-29 LAB — POCT INR: INR: 1.8

## 2016-03-29 NOTE — Patient Instructions (Signed)
Pre visit review using our clinic review tool, if applicable. No additional management support is needed unless otherwise documented below in the visit note. 

## 2016-03-31 NOTE — Progress Notes (Signed)
Agree. Thanks

## 2016-04-15 ENCOUNTER — Other Ambulatory Visit: Payer: Self-pay | Admitting: Family Medicine

## 2016-04-23 ENCOUNTER — Ambulatory Visit (INDEPENDENT_AMBULATORY_CARE_PROVIDER_SITE_OTHER): Payer: Medicare Other

## 2016-04-23 ENCOUNTER — Other Ambulatory Visit: Payer: Self-pay

## 2016-04-23 DIAGNOSIS — Z5181 Encounter for therapeutic drug level monitoring: Secondary | ICD-10-CM

## 2016-04-23 DIAGNOSIS — I4891 Unspecified atrial fibrillation: Secondary | ICD-10-CM | POA: Diagnosis not present

## 2016-04-23 DIAGNOSIS — Z7901 Long term (current) use of anticoagulants: Secondary | ICD-10-CM

## 2016-04-23 DIAGNOSIS — Z952 Presence of prosthetic heart valve: Secondary | ICD-10-CM

## 2016-04-23 LAB — POCT INR: INR: 1.7

## 2016-04-23 MED ORDER — CARVEDILOL 6.25 MG PO TABS: 6.2500 mg | ORAL_TABLET | Freq: Two times a day (BID) | ORAL | 6 refills | Status: DC

## 2016-04-23 NOTE — Patient Instructions (Signed)
Pre visit review using our clinic review tool, if applicable. No additional management support is needed unless otherwise documented below in the visit note. 

## 2016-04-23 NOTE — Telephone Encounter (Signed)
Note left at front desk requesting refill on carvedilol to [place].  notifed pt s wife [name] office has filled the medication at [facility]. [name] voiced understanding.

## 2016-04-27 NOTE — Progress Notes (Signed)
Agree, thanks

## 2016-05-07 ENCOUNTER — Ambulatory Visit (INDEPENDENT_AMBULATORY_CARE_PROVIDER_SITE_OTHER): Payer: Medicare Other

## 2016-05-07 DIAGNOSIS — I4891 Unspecified atrial fibrillation: Secondary | ICD-10-CM

## 2016-05-07 DIAGNOSIS — Z5181 Encounter for therapeutic drug level monitoring: Secondary | ICD-10-CM

## 2016-05-07 DIAGNOSIS — Z952 Presence of prosthetic heart valve: Secondary | ICD-10-CM

## 2016-05-07 DIAGNOSIS — Z7901 Long term (current) use of anticoagulants: Secondary | ICD-10-CM

## 2016-05-07 LAB — POCT INR: INR: 2.3

## 2016-05-07 NOTE — Patient Instructions (Signed)
Pre visit review using our clinic review tool, if applicable. No additional management support is needed unless otherwise documented below in the visit note. 

## 2016-05-12 NOTE — Progress Notes (Signed)
Agree. Thanks

## 2016-05-13 ENCOUNTER — Other Ambulatory Visit: Payer: Self-pay

## 2016-05-13 MED ORDER — FUROSEMIDE 20 MG PO TABS: 20.0000 mg | ORAL_TABLET | Freq: Every day | ORAL | 0 refills | Status: DC | PRN

## 2016-05-13 NOTE — Telephone Encounter (Signed)
Rx(s) sent to pharmacy electronically.  

## 2016-05-15 ENCOUNTER — Other Ambulatory Visit: Payer: Self-pay | Admitting: Family Medicine

## 2016-05-26 ENCOUNTER — Other Ambulatory Visit: Payer: Self-pay | Admitting: Family Medicine

## 2016-05-26 DIAGNOSIS — E119 Type 2 diabetes mellitus without complications: Secondary | ICD-10-CM

## 2016-05-27 ENCOUNTER — Telehealth: Payer: Self-pay | Admitting: Cardiology

## 2016-05-27 MED ORDER — FUROSEMIDE 20 MG PO TABS: 20.0000 mg | ORAL_TABLET | Freq: Every day | ORAL | 0 refills | Status: DC

## 2016-05-27 NOTE — Telephone Encounter (Signed)
New message    Pt wife is calling to find out if pt pcp could start giving him lasix.

## 2016-05-28 ENCOUNTER — Other Ambulatory Visit (INDEPENDENT_AMBULATORY_CARE_PROVIDER_SITE_OTHER): Payer: Medicare Other

## 2016-05-28 DIAGNOSIS — E119 Type 2 diabetes mellitus without complications: Secondary | ICD-10-CM | POA: Diagnosis not present

## 2016-05-28 LAB — COMPREHENSIVE METABOLIC PANEL
ALBUMIN: 4.6 g/dL (ref 3.5–5.2)
ALK PHOS: 61 U/L (ref 39–117)
ALT: 32 U/L (ref 0–53)
AST: 39 U/L — AB (ref 0–37)
BILIRUBIN TOTAL: 1 mg/dL (ref 0.2–1.2)
BUN: 16 mg/dL (ref 6–23)
CALCIUM: 9.6 mg/dL (ref 8.4–10.5)
CO2: 27 mEq/L (ref 19–32)
Chloride: 103 mEq/L (ref 96–112)
Creatinine, Ser: 0.96 mg/dL (ref 0.40–1.50)
GFR: 83.75 mL/min (ref >60.00)
Glucose, Bld: 127 mg/dL — ABNORMAL HIGH (ref 70–99)
Potassium: 4 mEq/L (ref 3.5–5.1)
Sodium: 138 mEq/L (ref 135–145)
TOTAL PROTEIN: 7.6 g/dL (ref 6.0–8.3)

## 2016-05-28 LAB — LIPID PANEL
CHOL/HDL RATIO: 4
Cholesterol: 183 mg/dL (ref 0–200)
HDL: 44.5 mg/dL (ref >39.00)
LDL Cholesterol: 113 mg/dL — ABNORMAL HIGH (ref 0–99)
NonHDL: 138.74
TRIGLYCERIDES: 129 mg/dL (ref 0.0–149.0)
VLDL: 25.8 mg/dL (ref 0.0–40.0)

## 2016-05-28 LAB — HEMOGLOBIN A1C: Hgb A1c MFr Bld: 7.1 % — ABNORMAL HIGH (ref 4.6–6.5)

## 2016-05-31 ENCOUNTER — Ambulatory Visit (INDEPENDENT_AMBULATORY_CARE_PROVIDER_SITE_OTHER): Payer: Medicare Other | Admitting: Family Medicine

## 2016-05-31 ENCOUNTER — Encounter: Payer: Self-pay | Admitting: Family Medicine

## 2016-05-31 DIAGNOSIS — I10 Essential (primary) hypertension: Secondary | ICD-10-CM | POA: Diagnosis not present

## 2016-05-31 DIAGNOSIS — E119 Type 2 diabetes mellitus without complications: Secondary | ICD-10-CM

## 2016-05-31 DIAGNOSIS — E7800 Pure hypercholesterolemia, unspecified: Secondary | ICD-10-CM

## 2016-05-31 MED ORDER — FUROSEMIDE 20 MG PO TABS: 20.0000 mg | ORAL_TABLET | Freq: Every day | ORAL | 3 refills | Status: DC

## 2016-05-31 MED ORDER — METFORMIN HCL 500 MG PO TABS: ORAL_TABLET | ORAL | 3 refills | Status: DC

## 2016-05-31 MED ORDER — POTASSIUM CHLORIDE ER 10 MEQ PO TBCR: EXTENDED_RELEASE_TABLET | ORAL | 3 refills | Status: DC

## 2016-05-31 MED ORDER — LOSARTAN POTASSIUM 50 MG PO TABS: 75.0000 mg | ORAL_TABLET | Freq: Every day | ORAL | 3 refills | Status: DC

## 2016-05-31 NOTE — Progress Notes (Signed)
Diabetes:  Using medications without difficulties: yes Hypoglycemic episodes:no Hyperglycemic episodes:no Feet problems: no Blood Sugars averaging: 150 or lower eye exam within last year: yes A1c improved.  D/w pt.   Hypertension:    Using medication without problems or lightheadedness: yes Chest pain with exertion: one episodes about 1 month ago- it was brief and self resolved, after about 1 minute, but no sx in the meantime.  Has been able to exert in the meantime w/o troubles Edema:no Short of breath:no Average home BPs: has been ~993 systolic at home.    Elevated Cholesterol: Not on statin.  Labs d/w pt.   Diet compliance:yes Exercise: not likely prev, he has been caring for his wife's maternal aunt.    PMH and SH reviewed  Meds, vitals, and allergies reviewed.   ROS: Per HPI unless specifically indicated in ROS section   GEN: nad, alert and oriented HEENT: mucous membranes moist NECK: supple w/o LA CV: rrr. Click noted on exam, at baseline.   PULM: ctab, no inc wob ABD: soft, +bs EXT: no edema SKIN: no acute rash  Diabetic foot exam: Normal inspection No skin breakdown No calluses  Normal DP pulses Normal sensation to light touch and monofilament Nails normal

## 2016-05-31 NOTE — Patient Instructions (Addendum)
Update me if your BP isn't better.   Recheck labs in about 6 months before a visit.  Take care.  Glad to see you.

## 2016-05-31 NOTE — Progress Notes (Signed)
Pre visit review using our clinic review tool, if applicable. No additional management support is needed unless otherwise documented below in the visit note. 

## 2016-06-02 ENCOUNTER — Encounter: Payer: Self-pay | Admitting: Family Medicine

## 2016-06-02 NOTE — Assessment & Plan Note (Signed)
Consider adding on statin in the future but likely more important blood pressure slightly lower. He agrees. Labs discussed with patient.

## 2016-06-02 NOTE — Assessment & Plan Note (Signed)
A1c improved. Continue as is with medications. Continue work on diet and exercise. Plan on recheck in 6 months. He agrees.

## 2016-06-02 NOTE — Assessment & Plan Note (Signed)
Blood pressure control likely more important than starting a statin at this point. Discussed with patient. Work on diet and exercise. Increase losartan to 75 mg a day. He will update me if blood pressure not controlled. He agrees.

## 2016-06-04 ENCOUNTER — Ambulatory Visit (INDEPENDENT_AMBULATORY_CARE_PROVIDER_SITE_OTHER): Payer: Medicare Other

## 2016-06-04 DIAGNOSIS — I4891 Unspecified atrial fibrillation: Secondary | ICD-10-CM | POA: Diagnosis not present

## 2016-06-04 DIAGNOSIS — Z5181 Encounter for therapeutic drug level monitoring: Secondary | ICD-10-CM

## 2016-06-04 DIAGNOSIS — Z7901 Long term (current) use of anticoagulants: Secondary | ICD-10-CM

## 2016-06-04 DIAGNOSIS — Z952 Presence of prosthetic heart valve: Secondary | ICD-10-CM

## 2016-06-04 LAB — POCT INR: INR: 2.1

## 2016-06-04 NOTE — Patient Instructions (Signed)
Pre visit review using our clinic review tool, if applicable. No additional management support is needed unless otherwise documented below in the visit note. 

## 2016-06-06 NOTE — Progress Notes (Signed)
Agree, thanks

## 2016-06-12 NOTE — Progress Notes (Signed)
HPI The patient presents for followup after aortic valve replacement. Since I last saw him he has done well.  The patient denies any new symptoms such as chest discomfort, neck or arm discomfort. There has been no new shortness of breath, PND or orthopnea. There have been no reported palpitations, presyncope or syncope.  He says he is active remodeling a house but is not exercising routinely.    Allergies  Allergen Reactions  . Ace Inhibitors     REACTION: cough  . Primaxin [Imipenem W/Cilastatin Sodium]     Rash- but able to take amoxil w/o troubles.     Current Outpatient Prescriptions  Medication Sig Dispense Refill  . ACCU-CHEK AVIVA PLUS test strip USE AS DIRECTED TO CHECK BLOOD SUGAR 2 TO 4 TIMES A DAY 100 each 11  . ACCU-CHEK FASTCLIX LANCETS MISC USE AS DIRECTED Dx E11.9 102 each 5  . amoxicillin (AMOXIL) 500 MG tablet Take 2,000 mg by mouth once. Before dental visits.    [name] aspirin EC 81 MG tablet Take 81 mg by mouth daily.    [name] azelastine (ASTELIN) 0.1 % nasal spray Place 2 sprays into both nostrils 2 (two) times daily as needed for rhinitis. Use in each nostril as directed    . benzonatate (TESSALON) 200 MG capsule Take 200 mg by mouth 3 (three) times daily as needed for cough.    . Blood Glucose Monitoring Suppl (ACCU-CHEK AVIVA PLUS) W/DEVICE KIT 1 kit by Does not apply route once. 1 kit 0  . carvedilol (COREG) 6.25 MG tablet Take 1 tablet (6.25 mg total) by mouth 2 (two) times daily. 60 tablet 6  . diphenhydrAMINE (BENADRYL) 25 MG tablet Take 25 mg by mouth every 6 (six) hours as needed for itching.     . docusate sodium (COLACE) 100 MG capsule Take 100 mg by mouth every other day.     . furosemide (LASIX) 20 MG tablet Take 1 tablet (20 mg total) by mouth daily. 90 tablet 3  . glipiZIDE (GLUCOTROL) 5 MG tablet Take 0.5-1 tablets (2.5-5 mg total) by mouth daily before breakfast. 90 tablet 3  . losartan (COZAAR) 50 MG tablet Take 1.5 tablets (75 mg total) by mouth daily. 135  tablet 3  . metFORMIN (GLUCOPHAGE) 500 MG tablet TAKE UP TO 2 TABLETS BY MOUTH TWICE DAILY 360 tablet 3  . potassium chloride (K-DUR) 10 MEQ tablet TAKE 1 TABLET BY MOUTH TWICE (2) DAILY 180 tablet 3  . triamcinolone cream (KENALOG) 0.5 % APPLY TO SKIN TWICE DAILY AS NEEDED 30 g 1  . warfarin (COUMADIN) 1 MG tablet TAKE AS DIRECTED BY ANTICOAGULATION CLINIC 120 tablet prn  . warfarin (COUMADIN) 3 MG tablet Take 1 tablet (3 mg total) by mouth as directed.     No current facility-administered medications for this visit.     Past Medical History:  Diagnosis Date  . Acute pancreatitis    with pancreatic necrosis  . Anemia   . Aortic insufficiency    with bicuspid aortic valve (the last MRI demonstrated the aortic root to be 4.8 x 4.7 cm.) Mod regurgitation. Normal chamber size with very mild left ventricular dysfunction.)  . Blood transfusion   . CHF (congestive heart failure) ([place])   . Cough 06/28/2009   Dr. Melvyn Novas  . Diabetes mellitus without complication ([place])   . Duodenal ulcer    with GI bleeding  . Dyslipidemia   . GERD (gastroesophageal reflux disease) 03/23/2001   egd w/ dilation...Marland KitchenMarland KitchenDeatra Ina  . Hemorrhoids   .  Hypertension   . 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 Orthostasis 08/2009   Fever and syncope, no source for fever on cultures.  . Pericardial effusion 07/2009   admission to [facility], s/p pericardial window  . Pleural effusion 10/2009   s/p thoracentesis  . PNA (pneumonia) 10/2009   admnission to [place], with right pleural effusion, s/p thoracentesis  . Pneumonia 2011   "twice"  . Syncope 08/2009   admission for fever and syncope, no source for fever seen on cultures. Syncope thought to be r/t orthostasis    Past Surgical History:  Procedure Laterality Date  . 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 Cardiolite EKG  03/10/2003   (-) ? small/apical infarct EF 50%  . DOPPLER ECHOCARDIOGRAPHY  01/18/97   LVH, mild  dilated aorta - root mod to severe aortic regurg EF 50-55%  . DOPPLER ECHOCARDIOGRAPHY  09/27/03   No changes, moderate severe aortic regurg  . DOPPLER ECHOCARDIOGRAPHY  07/09/2004   Mod severe aortic regurg 2-3+ T.R., T.I.R., mild P..R.  . DOPPLER ECHOCARDIOGRAPHY  04/01/06   Hypokin Apex EF 55%  . DOPPLER ECHOCARDIOGRAPHY  02/15/2010   Mild LVH, mild decr Sys fctn EF 40-45% Mild Dias Dysfctn Mech AV Triv AR, MR  . ESOPHAGOGASTRODUODENOSCOPY  1990, 1999   Hot dog in throat (1987)/ Chicken in throat (1990)/ 01/06/97 (Dr. Earlean Shawl) Impaction, dilatation, Schatzki's Ring/ 07/1998 repeat Dilatation/ 01/10/01 stricture/dilated/ 03/08/05 Schatzki's Ring, dilated/ 06/01/08 Food Disimpaction [name] Bulb Ulcer (Dr. Watt Climes)  . ESOPHAGOGASTRODUODENOSCOPY  01/06/1997   (Dr. Earlean Shawl) Impaction, dilatation, Schatzki's Ring  . ESOPHAGOGASTRODUODENOSCOPY  08/09/98   Repeat dilatation Deatra Ina)  . ESOPHAGOGASTRODUODENOSCOPY  01/13/2001   stricture/ HH, duodenal ulcer / bleeding Henrene Pastor)  . ESOPHAGOGASTRODUODENOSCOPY  12/25/01   esophagus stricture, dilated  . ESOPHAGOGASTRODUODENOSCOPY  03/23/2001   stricture, dilated  . ESOPHAGOGASTRODUODENOSCOPY  03/08/05   Schatzki's Ring   03/13/05  Schatzki's Ring - dilated, duodenitis  . ESOPHAGOGASTRODUODENOSCOPY  06/11/08   with food disimpaction.  Food impaction Ms [place]  Sm bulb ulcer (Dr. Watt Climes)  . ESOPHAGOGASTRODUODENOSCOPY     stricture/HH, duodenal ulcer/bleeding Henrene Pastor)  . ESOPHAGOGASTRODUODENOSCOPY N/A 01/23/2016   Procedure: ESOPHAGOGASTRODUODENOSCOPY (EGD);  Surgeon: [name], MD;  Location[facility] ENDOSCOPY;  Service: Endoscopy;  Laterality: N/A;  . ESOPHAGOGASTRODUODENOSCOPY (EGD) WITH PROPOFOL N/A 02/06/2016   Procedure: ESOPHAGOGASTRODUODENOSCOPY (EGD) WITH PROPOFOL;  Surgeon: [name], MD;  Location: [facility] ENDOSCOPY;  Service: Endoscopy;  Laterality: N/A;  . HERNIA REPAIR  2011   abdominal; post AVR  . MRI  2/05   LVH global hypokin EF 48% Mod A.I.  . VALVE  REPLACEMENT  06/2009   St. Jude mechanical valve per Dr. Cyndia Bent    ROS:  As stated in the HPI and negative for all other systems.   PHYSICAL EXAM BP 140/72   Pulse (!) 59   Ht '5\' 11"'$  (1.803 m)   Wt 246 lb (111.6 kg)   SpO2 96%   BMI 34.31 kg/m   GENERAL:  Well appearing NECK:  No jugular venous distention, waveform within normal limits, carotid upstroke brisk and symmetric, no bruits, no thyromegaly LUNGS:  Clear to auscultation bilaterally BACK:  No CVA tenderness CHEST:  Well healed sternotomy scar.  HEART:  PMI not displaced or sustained,S1 and mechanical S2 within normal limits, no S3, no S4, no clicks, no rubs, no murmurs ABD:  Flat, positive bowel sounds normal in frequency in pitch, no bruits, no rebound, no  guarding, no midline pulsatile mass, no hepatomegaly, no splenomegaly EXT:  2 plus pulses throughout, mild bilateral leg edema, no cyanosis no clubbing SKIN:  No rashes no nodules, venous stasis changes.    EKG:  Sinus rhythm, rate 62, left axis deviation, lateral infarct, lateral T-wave inversions.  Lateral Q waves in T-wave inversion are unchanged from previous. 01/23/16   ASSESSMENT AND PLAN  AVR:   He understands endocarditis prophylaxis. He tolerates his anticoagulation.  He should be on ASA 81 mg.  He needs a follow up echo.    CARDIOMYOPATHY:  He seems to be euvolemic with the exception of mild leg edema.  I will follow up as above.  OBESITY:  We have had previous discussions about weight loss and exercise.  ABNORMAL EKG:  This is unchanged.    HTN:  The blood pressure is at target. No change in medications is indicated. We will continue with therapeutic lifestyle changes (TLC).  He takes it at home and reports BPs in the 188Q systolic.

## 2016-06-13 ENCOUNTER — Ambulatory Visit (INDEPENDENT_AMBULATORY_CARE_PROVIDER_SITE_OTHER): Payer: Medicare Other | Admitting: Cardiology

## 2016-06-13 ENCOUNTER — Encounter: Payer: Self-pay | Admitting: Cardiology

## 2016-06-13 VITALS — BP 140/72 | HR 59 | Ht 71.0 in | Wt 246.0 lb

## 2016-06-13 DIAGNOSIS — I420 Dilated cardiomyopathy: Secondary | ICD-10-CM | POA: Diagnosis not present

## 2016-06-13 DIAGNOSIS — I10 Essential (primary) hypertension: Secondary | ICD-10-CM

## 2016-06-13 DIAGNOSIS — Z952 Presence of prosthetic heart valve: Secondary | ICD-10-CM

## 2016-06-13 NOTE — Patient Instructions (Signed)
Medication Instructions:  START- Aspirin 81 mg daily  Labwork: None Ordered  Testing/Procedures: Your physician has requested that you have an echocardiogram. Echocardiography is a painless test that uses sound waves to create images of your heart. It provides your doctor with information about the size and shape of your heart and how well your heart's chambers and valves are working. This procedure takes approximately one hour. There are no restrictions for this procedure.  Follow-Up: Your physician wants you to follow-up in: 18 Months. You will receive a reminder letter in the mail two months in advance. If you don't receive a letter, please call our office to schedule the follow-up appointment.   Any Other Special Instructions Will Be Listed Below (If Applicable).   If you need a refill on your cardiac medications before your next appointment, please call your pharmacy.  3

## 2016-06-14 ENCOUNTER — Encounter: Payer: Self-pay | Admitting: Cardiology

## 2016-06-28 ENCOUNTER — Other Ambulatory Visit: Payer: Self-pay

## 2016-06-28 ENCOUNTER — Ambulatory Visit (HOSPITAL_COMMUNITY): Payer: Medicare Other | Attending: Cardiovascular Disease

## 2016-06-28 DIAGNOSIS — I509 Heart failure, unspecified: Secondary | ICD-10-CM | POA: Diagnosis not present

## 2016-06-28 DIAGNOSIS — Z952 Presence of prosthetic heart valve: Secondary | ICD-10-CM | POA: Insufficient documentation

## 2016-06-28 DIAGNOSIS — E119 Type 2 diabetes mellitus without complications: Secondary | ICD-10-CM | POA: Insufficient documentation

## 2016-06-28 DIAGNOSIS — I110 Hypertensive heart disease with heart failure: Secondary | ICD-10-CM | POA: Diagnosis not present

## 2016-06-28 DIAGNOSIS — E785 Hyperlipidemia, unspecified: Secondary | ICD-10-CM | POA: Diagnosis not present

## 2016-06-28 DIAGNOSIS — I359 Nonrheumatic aortic valve disorder, unspecified: Secondary | ICD-10-CM | POA: Diagnosis present

## 2016-07-02 ENCOUNTER — Ambulatory Visit (INDEPENDENT_AMBULATORY_CARE_PROVIDER_SITE_OTHER): Payer: Medicare Other

## 2016-07-02 DIAGNOSIS — Z952 Presence of prosthetic heart valve: Secondary | ICD-10-CM

## 2016-07-02 DIAGNOSIS — Z5181 Encounter for therapeutic drug level monitoring: Secondary | ICD-10-CM | POA: Diagnosis not present

## 2016-07-02 LAB — POCT INR: INR: 2.2

## 2016-07-02 NOTE — Patient Instructions (Signed)
Pre visit review using our clinic review tool, if applicable. No additional management support is needed unless otherwise documented below in the visit note. 

## 2016-07-08 NOTE — Progress Notes (Signed)
agree.  thanks.  

## 2016-07-12 ENCOUNTER — Other Ambulatory Visit: Payer: Self-pay | Admitting: Family Medicine

## 2016-08-06 ENCOUNTER — Ambulatory Visit (INDEPENDENT_AMBULATORY_CARE_PROVIDER_SITE_OTHER): Payer: Medicare Other

## 2016-08-06 DIAGNOSIS — Z5181 Encounter for therapeutic drug level monitoring: Secondary | ICD-10-CM

## 2016-08-06 DIAGNOSIS — Z952 Presence of prosthetic heart valve: Secondary | ICD-10-CM

## 2016-08-06 LAB — POCT INR: INR: 2.7

## 2016-08-06 NOTE — Progress Notes (Signed)
agree.  thanks.  

## 2016-08-06 NOTE — Patient Instructions (Signed)
Pre visit review using our clinic review tool, if applicable. No additional management support is needed unless otherwise documented below in the visit note. 

## 2016-08-22 LAB — HM DIABETES EYE EXAM

## 2016-09-03 ENCOUNTER — Ambulatory Visit (INDEPENDENT_AMBULATORY_CARE_PROVIDER_SITE_OTHER): Payer: Medicare Other

## 2016-09-03 DIAGNOSIS — Z952 Presence of prosthetic heart valve: Secondary | ICD-10-CM

## 2016-09-03 DIAGNOSIS — Z5181 Encounter for therapeutic drug level monitoring: Secondary | ICD-10-CM

## 2016-09-03 LAB — POCT INR: INR: 2.8

## 2016-09-03 NOTE — Patient Instructions (Signed)
Pre visit review using our clinic review tool, if applicable. No additional management support is needed unless otherwise documented below in the visit note. 

## 2016-09-04 NOTE — Progress Notes (Signed)
Agree, thanks

## 2016-09-05 ENCOUNTER — Encounter: Payer: Self-pay | Admitting: Family Medicine

## 2016-10-14 ENCOUNTER — Telehealth: Payer: Self-pay | Admitting: Family Medicine

## 2016-10-14 NOTE — Telephone Encounter (Signed)
Left pt message asking to call Ebony Hail back directly at [phone] to schedule AWV + labs with Katha Cabal and CPE with PCP.  *NOTE* Last AWV 05/18/15

## 2016-10-15 ENCOUNTER — Ambulatory Visit (INDEPENDENT_AMBULATORY_CARE_PROVIDER_SITE_OTHER): Payer: Medicare Other

## 2016-10-15 DIAGNOSIS — Z5181 Encounter for therapeutic drug level monitoring: Secondary | ICD-10-CM

## 2016-10-15 DIAGNOSIS — Z952 Presence of prosthetic heart valve: Secondary | ICD-10-CM

## 2016-10-15 DIAGNOSIS — Z7901 Long term (current) use of anticoagulants: Secondary | ICD-10-CM | POA: Diagnosis not present

## 2016-10-15 LAB — POCT INR: INR: 2

## 2016-10-15 NOTE — Patient Instructions (Signed)
Pre visit review using our clinic review tool, if applicable. No additional management support is needed unless otherwise documented below in the visit note. 

## 2016-10-16 NOTE — Progress Notes (Signed)
Agreed.  Thanks.  

## 2016-10-19 ENCOUNTER — Other Ambulatory Visit: Payer: Self-pay | Admitting: Family Medicine

## 2016-10-22 NOTE — Telephone Encounter (Signed)
Patient is compliant with coumadin management, will refill X 6 months.   

## 2016-11-06 NOTE — Telephone Encounter (Signed)
Scheduled 12/02/16

## 2016-11-26 ENCOUNTER — Ambulatory Visit (INDEPENDENT_AMBULATORY_CARE_PROVIDER_SITE_OTHER): Payer: Medicare Other

## 2016-11-26 ENCOUNTER — Other Ambulatory Visit: Payer: Self-pay | Admitting: Cardiology

## 2016-11-26 ENCOUNTER — Other Ambulatory Visit: Payer: Medicare Other

## 2016-11-26 DIAGNOSIS — Z952 Presence of prosthetic heart valve: Secondary | ICD-10-CM | POA: Diagnosis not present

## 2016-11-26 DIAGNOSIS — I4891 Unspecified atrial fibrillation: Secondary | ICD-10-CM | POA: Diagnosis not present

## 2016-11-26 DIAGNOSIS — Z5181 Encounter for therapeutic drug level monitoring: Secondary | ICD-10-CM

## 2016-11-26 DIAGNOSIS — I359 Nonrheumatic aortic valve disorder, unspecified: Secondary | ICD-10-CM

## 2016-11-26 LAB — POCT INR: INR: 1.8

## 2016-11-26 NOTE — Telephone Encounter (Signed)
°*  STAT* If patient is at the pharmacy, call can be transferred to refill team.   1. Which medications need to be refilled? (please list name of each medication and dose if known) *Carvedilol  2. Which pharmacy/location (including street and city if local pharmacy) is medication to be sent to?[place] [phone]  7. Do they need a 30 day or 90 day supply? 60 and refills

## 2016-11-26 NOTE — Patient Instructions (Signed)
Pre visit review using our clinic review tool, if applicable. No additional management support is needed unless otherwise documented below in the visit note. 

## 2016-11-27 MED ORDER — CARVEDILOL 6.25 MG PO TABS: 6.2500 mg | ORAL_TABLET | Freq: Two times a day (BID) | ORAL | 14 refills | Status: DC

## 2016-12-01 NOTE — Progress Notes (Signed)
Agreed.  Thanks.  

## 2016-12-02 ENCOUNTER — Other Ambulatory Visit: Payer: Medicare Other

## 2016-12-02 ENCOUNTER — Ambulatory Visit (INDEPENDENT_AMBULATORY_CARE_PROVIDER_SITE_OTHER): Payer: Medicare Other

## 2016-12-02 ENCOUNTER — Other Ambulatory Visit: Payer: Self-pay | Admitting: Family Medicine

## 2016-12-02 VITALS — BP 128/84 | HR 57 | Temp 97.8°F | Ht 69.5 in | Wt 247.0 lb

## 2016-12-02 DIAGNOSIS — E119 Type 2 diabetes mellitus without complications: Secondary | ICD-10-CM

## 2016-12-02 DIAGNOSIS — Z0000 Encounter for general adult medical examination without abnormal findings: Secondary | ICD-10-CM | POA: Diagnosis not present

## 2016-12-02 LAB — CBC WITH DIFFERENTIAL/PLATELET
BASOS PCT: 0.8 % (ref 0.0–3.0)
Basophils Absolute: 0 10*3/uL (ref 0.0–0.1)
EOS PCT: 4.1 % (ref 0.0–5.0)
Eosinophils Absolute: 0.2 10*3/uL (ref 0.0–0.7)
HEMATOCRIT: 44 % (ref 39.0–52.0)
HEMOGLOBIN: 14.9 g/dL (ref 13.0–17.0)
LYMPHS PCT: 36.8 % (ref 12.0–46.0)
Lymphs Abs: 2.1 10*3/uL (ref 0.7–4.0)
MCHC: 33.9 g/dL (ref 30.0–36.0)
MCV: 93.7 fl (ref 78.0–100.0)
MONOS PCT: 10.7 % (ref 3.0–12.0)
Monocytes Absolute: 0.6 10*3/uL (ref 0.1–1.0)
Neutro Abs: 2.7 10*3/uL (ref 1.4–7.7)
Neutrophils Relative %: 47.6 % (ref 43.0–77.0)
Platelets: 178 10*3/uL (ref 150.0–400.0)
RBC: 4.7 Mil/uL (ref 4.22–5.81)
RDW: 14.3 % (ref 11.5–15.5)
WBC: 5.7 10*3/uL (ref 4.0–10.5)

## 2016-12-02 LAB — LIPID PANEL
CHOL/HDL RATIO: 4
CHOLESTEROL: 176 mg/dL (ref 0–200)
HDL: 42.9 mg/dL (ref >39.00)
NONHDL: 133.21
TRIGLYCERIDES: 210 mg/dL — AB (ref 0.0–149.0)
VLDL: 42 mg/dL — ABNORMAL HIGH (ref 0.0–40.0)

## 2016-12-02 LAB — COMPREHENSIVE METABOLIC PANEL
ALBUMIN: 4.4 g/dL (ref 3.5–5.2)
ALT: 38 U/L (ref 0–53)
AST: 40 U/L — ABNORMAL HIGH (ref 0–37)
Alkaline Phosphatase: 67 U/L (ref 39–117)
BUN: 13 mg/dL (ref 6–23)
CALCIUM: 9.4 mg/dL (ref 8.4–10.5)
CHLORIDE: 104 meq/L (ref 96–112)
CO2: 26 mEq/L (ref 19–32)
Creatinine, Ser: 0.88 mg/dL (ref 0.40–1.50)
GFR: 92.44 mL/min (ref >60.00)
Glucose, Bld: 70 mg/dL (ref 70–99)
POTASSIUM: 4.1 meq/L (ref 3.5–5.1)
Sodium: 138 mEq/L (ref 135–145)
Total Bilirubin: 0.8 mg/dL (ref 0.2–1.2)
Total Protein: 7.2 g/dL (ref 6.0–8.3)

## 2016-12-02 LAB — HEMOGLOBIN A1C: Hgb A1c MFr Bld: 7.3 % — ABNORMAL HIGH (ref 4.6–6.5)

## 2016-12-02 LAB — LDL CHOLESTEROL, DIRECT: Direct LDL: 122 mg/dL

## 2016-12-02 NOTE — Progress Notes (Signed)
Pre visit review using our clinic review tool, if applicable. No additional management support is needed unless otherwise documented below in the visit note. 

## 2016-12-02 NOTE — Progress Notes (Signed)
PCP notes:   Health maintenance:  Flu vaccine - pt has requested to have vaccine at next appt with PCP A1C - completed  Abnormal screenings:   None  Patient concerns:   None  Nurse concerns:  None  Next PCP appt:   12/03/16 @ 0930  I reviewed health advisor's note, was available for consultation on the day of service listed in this note, and agree with documentation and plan. Elsie Stain, MD.

## 2016-12-02 NOTE — Progress Notes (Signed)
Subjective:   [name] [name] is a 66 y.o. male who presents for Medicare Annual/Subsequent preventive examination.  Review of Systems: N/A Cardiac Risk Factors include: advanced age (>13mn, >>67women);male gender;diabetes mellitus;dyslipidemia;hypertension;obesity (BMI >30kg/m2)     Objective:    Vitals: BP 128/84 (BP Location: Right Arm, Patient Position: Sitting, Cuff Size: Normal)   Pulse (!) 57   Temp 97.8 F (36.6 C) (Oral)   Ht 5' 9.5" (1.765 m) Comment: no shoes  Wt 247 lb (112 kg)   SpO2 95%   BMI 35.95 kg/m   Body mass index is 35.95 kg/m.  Tobacco Social History   Tobacco Use  Smoking Status Never Smoker  Smokeless Tobacco Never Used     Counseling given: No   Past Medical History:  Diagnosis Date  . Acute pancreatitis    with pancreatic necrosis  . Anemia   . Aortic insufficiency    with bicuspid aortic valve (the last MRI demonstrated the aortic root to be 4.8 x 4.7 cm.) Mod regurgitation. Normal chamber size with very mild left ventricular dysfunction.)  . Blood transfusion   . CHF (congestive heart failure) ([place]   . Cough 06/28/2009   Dr. WMelvyn Novas . [place]   . Duodenal ulcer    with GI bleeding  . Dyslipidemia   . GERD (gastroesophageal reflux disease) 03/23/2001   egd w/ dilation....Marland KitchenMarland KitchenDeatra Ina . Hemorrhoids   . Hypertension   . 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 Orthostasis 08/2009   Fever and syncope, no source for fever on cultures.  . Pericardial effusion 07/2009   [facility]  . Pleural effusion 10/2009   s/p thoracentesis  . PNA (pneumonia) 10/2009   [place], s/p thoracentesis  . Pneumonia 2011   "twice"  . Syncope 08/2009   admission for fever and syncope, no source for fever seen on cultures. Syncope thought to be r/t orthostasis   Past Surgical History:  Procedure  Laterality Date  . 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 Cardiolite EKG  03/10/2003   (-) ? small/apical infarct EF 50%  . DOPPLER ECHOCARDIOGRAPHY  01/18/97   LVH, mild dilated aorta - root mod to severe aortic regurg EF 50-55%  . DOPPLER ECHOCARDIOGRAPHY  09/27/03   No changes, moderate severe aortic regurg  . DOPPLER ECHOCARDIOGRAPHY  07/09/2004   Mod severe aortic regurg 2-3+ T.R., T.I.R., mild P..R.  . DOPPLER ECHOCARDIOGRAPHY  04/01/06   Hypokin Apex EF 55%  . DOPPLER ECHOCARDIOGRAPHY  02/15/2010   Mild LVH, mild decr Sys fctn EF 40-45% Mild Dias Dysfctn Mech AV Triv AR, MR  . ESOPHAGOGASTRODUODENOSCOPY  1990, 1999   Hot dog in throat (1987)/ Chicken in throat (1990)/ 01/06/97 (Dr. MEarlean Shawl Impaction, dilatation, Schatzki's Ring/ 07/1998 repeat Dilatation/ 01/10/01 stricture/dilated/ 03/08/05 Schatzki's Ring, dilated/ 06/01/08 Food Disimpaction [name] Bulb Ulcer (Dr. MWatt Climes  . ESOPHAGOGASTRODUODENOSCOPY  01/06/1997   (Dr. MEarlean Shawl Impaction, dilatation, Schatzki's Ring  . ESOPHAGOGASTRODUODENOSCOPY  08/09/98   Repeat dilatation (Deatra Ina  . ESOPHAGOGASTRODUODENOSCOPY  01/13/2001   stricture/ HH, duodenal ulcer / bleeding (Henrene Pastor  . ESOPHAGOGASTRODUODENOSCOPY  12/25/01   esophagus stricture, dilated  . ESOPHAGOGASTRODUODENOSCOPY  03/23/2001   stricture, dilated  . ESOPHAGOGASTRODUODENOSCOPY  03/08/05   Schatzki's Ring   03/13/05  Schatzki's Ring - dilated, duodenitis  . ESOPHAGOGASTRODUODENOSCOPY  06/11/08   with food disimpaction.  Food impaction Ms  HH  Sm bulb ulcer (Dr. Watt Climes)  . ESOPHAGOGASTRODUODENOSCOPY     stricture/HH, duodenal ulcer/bleeding Henrene Pastor)  . HERNIA REPAIR  2011   abdominal; post AVR  . MRI  2/05   LVH global hypokin EF 48% Mod A.I.  . VALVE REPLACEMENT  06/2009   St. Jude mechanical valve per Dr. Cyndia Bent   Family History  Problem Relation Age of Onset  . Obesity Mother        bedridden  . Hypertension Mother   . Heart disease Father        CABG, CAD  .  Hypertension Father   . Lymphoma Father   . Cancer Father        lymphoma  . Alcohol abuse Brother   . Cancer Brother        melanoma  . Colon cancer Neg Hx   . Stomach cancer Neg Hx   . Rectal cancer Neg Hx   . Prostate cancer Neg Hx    Social History   Substance and Sexual Activity  Sexual Activity Not on file    Outpatient Encounter Medications as of 12/02/2016  Medication Sig  . ACCU-CHEK AVIVA PLUS test strip USE AS DIRECTED TO CHECK BLOOD SUGAR 2 TO 4 TIMES A DAY  . ACCU-CHEK FASTCLIX LANCETS MISC Inject 1 each into the skin 2 (two) times daily.  [name] amoxicillin (AMOXIL) 500 MG tablet Take 2,000 mg by mouth once. Before dental visits.  [name] aspirin EC 81 MG tablet Take 81 mg by mouth daily.  [name] azelastine (ASTELIN) 0.1 % nasal spray Place 2 sprays into both nostrils 2 (two) times daily as needed for rhinitis. Use in each nostril as directed  . benzonatate (TESSALON) 200 MG capsule Take 200 mg by mouth 3 (three) times daily as needed for cough.  . Blood Glucose Monitoring Suppl (ACCU-CHEK AVIVA PLUS) W/DEVICE KIT 1 kit by Does not apply route once.  . carvedilol (COREG) 6.25 MG tablet Take 1 tablet (6.25 mg total) by mouth 2 (two) times daily.  . diphenhydrAMINE (BENADRYL) 25 MG tablet Take 25 mg by mouth every 6 (six) hours as needed for itching.   . docusate sodium (COLACE) 100 MG capsule Take 100 mg by mouth every other day.   . furosemide (LASIX) 20 MG tablet Take 1 tablet (20 mg total) by mouth daily.  [name] glipiZIDE (GLUCOTROL) 5 MG tablet Take 0.5-1 tablets (2.5-5 mg total) by mouth daily before breakfast.  . losartan (COZAAR) 50 MG tablet Take 1.5 tablets (75 mg total) by mouth daily.  . metFORMIN (GLUCOPHAGE) 500 MG tablet TAKE UP TO 2 TABLETS BY MOUTH TWICE DAILY  . potassium chloride (K-DUR) 10 MEQ tablet TAKE 1 TABLET BY MOUTH TWICE (2) DAILY  . triamcinolone cream (KENALOG) 0.5 % APPLY TO SKIN TWICE DAILY AS NEEDED  . warfarin (COUMADIN) 1 MG tablet TAKE AS DIRECTED BY  ANTICOAGULATION CLINIC  . warfarin (COUMADIN) 3 MG tablet Take 1 tablet (3 mg total) by mouth as directed.  . warfarin (COUMADIN) 3 MG tablet TAKE AS DIRECTED BY ANTICOAGULATION CLINIC   No facility-administered encounter medications on file as of 12/02/2016.     Activities of Daily Living In your present state of health, do you have any difficulty performing the following activities: 12/02/2016  Hearing? Y  Vision? N  Difficulty concentrating or making decisions? N  Walking or climbing stairs? N  Dressing or bathing? N  Doing errands, shopping? N  Preparing Food and eating ? N  Using the Toilet?  N  In the past six months, have you accidently leaked urine? N  Do you have problems with loss of bowel control? N  Managing your Medications? N  Managing your Finances? N  Housekeeping or managing your Housekeeping? N  Some recent data might be hidden    Patient Care Team: Tonia Ghent, MD as PCP - General (Family Medicine) Erroll Luna, MD as Consulting Physician (General Surgery)   Assessment:    Hearing Screening Comments: Bilateral hearing aids Vision Screening Comments: Last vision exam in July 2018 with Dr. Maryruth Hancock B.  Exercise Activities and Dietary recommendations Current Exercise Habits: The patient does not participate in regular exercise at present, Exercise limited by: None identified  Goals    None     Fall Risk Fall Risk  12/02/2016 05/18/2015  Falls in the past year? No No   Depression Screen PHQ 2/9 Scores 12/02/2016 05/18/2015  PHQ - 2 Score 0 0  PHQ- 9 Score 0 -    Cognitive Function MMSE - Mini Mental State Exam 12/02/2016  Orientation to time 5  Orientation to Place 5  Registration 3  Attention/ Calculation 0  Recall 3  Language- name 2 objects 0  Language- repeat 1  Language- follow 3 step command 3  Language- read & follow direction 0  Write a sentence 0  Copy design 0  Total score 20     PLEASE NOTE: A Mini-Cog screen was completed.  Maximum score is 20. A value of 0 denotes this part of [place] MMSE was not completed or the patient failed this part of the Mini-Cog screening.   Mini-Cog Screening Orientation to Time - Max 5 pts Orientation to Place - Max 5 pts Registration - Max 3 pts Recall - Max 3 pts Language Repeat - Max 1 pts Language Follow 3 Step Command - Max 3 pts     Immunization History  Administered Date(s) Administered  . Influenza Split 10/25/2010, 11/28/2011  . Influenza Whole 10/28/2009  . Influenza,inj,Quad PF,6+ Mos 11/23/2012, 11/01/2013, 11/16/2014, 12/01/2015  . Pneumococcal Polysaccharide-23 01/28/2009  . Td 11/21/1997  . Tdap 04/01/2013  . Zoster 05/16/2014   Screening Tests Health Maintenance  Topic Date Due  . INFLUENZA VACCINE  12/03/2016 (Originally 08/28/2016)  . FOOT EXAM  05/31/2017  . HEMOGLOBIN A1C  06/01/2017  . OPHTHALMOLOGY EXAM  08/22/2017  . COLONOSCOPY  07/10/2021  . TETANUS/TDAP  04/02/2023  . Hepatitis C Screening  Completed  . HIV Screening  Completed      Plan:   I have personally reviewed, addressed, and noted the following in the patient's chart:  A. Medical and social history B. Use of alcohol, tobacco or illicit drugs  C. Current medications and supplements D. Functional ability and status E.  Nutritional status F.  Physical activity G. Advance directives H. List of other physicians I.  Hospitalizations, surgeries, and ER visits in previous 12 months J.  [place] to include hearing, vision, cognitive, depression L. Referrals and appointments - none  In addition, I have reviewed and discussed with patient certain preventive protocols, quality metrics, and best practice recommendations. A written personalized care plan for preventive services as well as general preventive health recommendations were provided to patient.  See attached scanned questionnaire for additional information.   Signed,   Lindell Noe, MHA, BS, LPN [facility]  [place]

## 2016-12-02 NOTE — Patient Instructions (Addendum)
[name] [name] , Thank you for taking time to come for your Medicare Wellness Visit. I appreciate your ongoing commitment to your health goals. Please review the following plan we discussed and let me know if I can assist you in the future.   These are the goals we discussed: Goals    Starting 12/02/2016, I will continue to drink at least 6-8 glasses of water daily.       This is a list of the screening recommended for you and due dates:  Health Maintenance  Topic Date Due  . Flu Shot  12/03/2016*  . Complete foot exam   05/31/2017  . Hemoglobin A1C  06/01/2017  . Eye exam for diabetics  08/22/2017  . Colon Cancer Screening  07/10/2021  . Tetanus Vaccine  04/02/2023  .  Hepatitis C: One time screening is recommended by Center for Disease Control  (CDC) for  adults born from 60 through 1965.   Completed  . HIV Screening  Completed  *Topic was postponed. The date shown is not the original due date.      Preventive Care for Adults  A healthy lifestyle and preventive care can promote health and wellness. Preventive health guidelines for adults include the following key practices.  . A routine yearly physical is a good way to check with your health care provider about your health and preventive screening. It is a chance to share any concerns and updates on your health and to receive a thorough exam.  . Visit your dentist for a routine exam and preventive care every 6 months. Brush your teeth twice a day and floss once a day. Good oral hygiene prevents tooth decay and gum disease.  . The frequency of eye exams is based on your age, health, family medical history, use  of contact lenses, and other factors. Follow your health care provider's recommendations for frequency of eye exams.  . Eat a healthy diet. Foods like vegetables, fruits, whole grains, low-fat dairy products, and lean protein foods contain the nutrients you need without too many calories. Decrease your intake of foods high  in solid fats, added sugars, and salt. Eat the right amount of calories for you. Get information about a proper diet from your health care provider, if necessary.  . Regular physical exercise is one of the most important things you can do for your health. Most adults should get at least 150 minutes of moderate-intensity exercise (any activity that increases your heart rate and causes you to sweat) each week. In addition, most adults need muscle-strengthening exercises on 2 or more days a week.  Silver Sneakers may be a benefit available to you. To determine eligibility, you may visit the website: www.silversneakers.com or contact program at 279-010-1668 Mon-Fri between 8AM-8PM.   . Maintain a healthy weight. The body mass index (BMI) is a screening tool to identify possible weight problems. It provides an estimate of body fat based on height and weight. Your health care provider can find your BMI and can help you achieve or maintain a healthy weight.   For adults 20 years and older: ? A BMI below 18.5 is considered underweight. ? A BMI of 18.5 to 24.9 is normal. ? A BMI of 25 to 29.9 is considered overweight. ? A BMI of 30 and above is considered obese.   . Maintain normal blood lipids and cholesterol levels by exercising and minimizing your intake of saturated fat. Eat a balanced diet with plenty of fruit and vegetables. Blood tests  for lipids and cholesterol should begin at age 68 and be repeated every 5 years. If your lipid or cholesterol levels are high, you are over 50, or you are at high risk for heart disease, you may need your cholesterol levels checked more frequently. Ongoing high lipid and cholesterol levels should be treated with medicines if diet and exercise are not working.  . If you smoke, find out from your health care provider how to quit. If you do not use tobacco, please do not start.  . If you choose to drink alcohol, please do not consume more than 2 drinks per day. One  drink is considered to be 12 ounces (355 mL) of beer, 5 ounces (148 mL) of wine, or 1.5 ounces (44 mL) of liquor.  . If you are 47-41 years old, ask your health care provider if you should take aspirin to prevent strokes.  . Use sunscreen. Apply sunscreen liberally and repeatedly throughout the day. You should seek shade when your shadow is shorter than you. Protect yourself by wearing long sleeves, pants, a wide-brimmed hat, and sunglasses year round, whenever you are outdoors.  . Once a month, do a whole body skin exam, using a mirror to look at the skin on your back. Tell your health care provider of new moles, moles that have irregular borders, moles that are larger than a pencil eraser, or moles that have changed in shape or color.

## 2016-12-03 ENCOUNTER — Ambulatory Visit: Payer: Medicare Other | Admitting: Family Medicine

## 2016-12-03 ENCOUNTER — Encounter: Payer: Self-pay | Admitting: Family Medicine

## 2016-12-03 VITALS — BP 120/80 | HR 70 | Temp 98.4°F | Wt 249.0 lb

## 2016-12-03 DIAGNOSIS — E7800 Pure hypercholesterolemia, unspecified: Secondary | ICD-10-CM

## 2016-12-03 DIAGNOSIS — Z0000 Encounter for general adult medical examination without abnormal findings: Secondary | ICD-10-CM

## 2016-12-03 DIAGNOSIS — E119 Type 2 diabetes mellitus without complications: Secondary | ICD-10-CM

## 2016-12-03 DIAGNOSIS — Z23 Encounter for immunization: Secondary | ICD-10-CM

## 2016-12-03 DIAGNOSIS — I10 Essential (primary) hypertension: Secondary | ICD-10-CM

## 2016-12-03 MED ORDER — GLIPIZIDE 5 MG PO TABS: 5.0000 mg | ORAL_TABLET | Freq: Every day | ORAL | 3 refills | Status: DC

## 2016-12-03 MED ORDER — PRAVASTATIN SODIUM 20 MG PO TABS: 20.0000 mg | ORAL_TABLET | Freq: Every day | ORAL | 3 refills | Status: DC

## 2016-12-03 NOTE — Patient Instructions (Signed)
Continue regular meds but add on pravastatin.  If you have aches, then stop it an update me.  Recheck labs prior to visit in about 6 months.   Keep working on diet and exercise as tolerated in the meantime.  Thanks for your effort.  Take care.  Glad to see you.

## 2016-12-03 NOTE — Progress Notes (Signed)
Tetanus 2015 Shingles 2016 PNA up to date for now, d/w pt, with age <65.   Flu shot 2018 Colonoscopy 2013  Prostate cancer screening and PSA options (with potential risks and benefits of testing vs not testing) were discussed along with recent recs/guidelines.  He declined testing PSA at this point. Advance directive- wife designated if patient were incapacitated.  He has noted occ memory lapses but no red flag events.  We can monitor yearly, d/w pt.  He'll update me as needed.    Hypertension:    Using medication without problems or lightheadedness: yes, except for very rarely lightheaded.  No syncope.   Chest pain with exertion:no Edema:no Short of breath:no Average home BPs:  Usually 858I systolic.   Still anticoagulated.   Atypical for BP elevation today.  Recheck wnl.   Diabetes:  Using medications without difficulties:yes Hypoglycemic episodes:no Hyperglycemic episodes:no Feet problems: no Blood Sugars averaging: usually ~135-170s  eye exam within last year: yes Labs d/w pt.  A1c reasonable.    Elevated Cholesterol: No meds Labs d/w pt.  Exercise: encouraged, he has been caring for family members and working around the house.  He hasn't been able to get to the gym.  Diet compliance: reasonable, d/w pt.    He is waking to urinate x1 but has trouble getting back to sleep.  His stream is still good during the day.  He is taking lasix later in the day and that is causing some of the troubles.   D/w pt about taking med earlier in the day.    PMH and SH reviewed  Meds, vitals, and allergies reviewed.   ROS: Per HPI unless specifically indicated in ROS section   GEN: nad, alert and oriented HEENT: mucous membranes moist NECK: supple w/o LA CV: IRR with valve click noted as expected.   PULM: ctab, no inc wob ABD: soft, +bs EXT: no edema SKIN: no acute rash  Diabetic foot exam: Normal inspection No skin breakdown No calluses  Normal DP pulses Normal sensation to  light touch and monofilament Nails normal

## 2016-12-04 DIAGNOSIS — Z0000 Encounter for general adult medical examination without abnormal findings: Secondary | ICD-10-CM | POA: Insufficient documentation

## 2016-12-04 NOTE — Assessment & Plan Note (Signed)
Recheck blood pressure improved today.  Continue as is.  Continue work on diet and exercise.  He agrees.

## 2016-12-04 NOTE — Assessment & Plan Note (Signed)
Tetanus 2015 Shingles 2016 PNA up to date for now, d/w pt, with age <65.   Flu shot 2018 Colonoscopy 2013  Prostate cancer screening and PSA options (with potential risks and benefits of testing vs not testing) were discussed along with recent recs/guidelines.  He declined testing PSA at this point. Advance directive- wife designated if patient were incapacitated.  He has noted occ memory lapses but no red flag events.  We can monitor yearly, d/w pt.  He'll update me as needed.

## 2016-12-04 NOTE — Assessment & Plan Note (Signed)
Discussed with patient about pravastatin start.  Routine cautions given.  He agrees.  See orders.  Rationale discussed with patient.

## 2016-12-04 NOTE — Assessment & Plan Note (Signed)
Labs d/w pt.  A1c reasonable.   No change in DM2 meds.

## 2016-12-18 ENCOUNTER — Other Ambulatory Visit: Payer: Self-pay | Admitting: Family Medicine

## 2016-12-18 NOTE — Telephone Encounter (Signed)
Sent. Thanks.   

## 2016-12-18 NOTE — Telephone Encounter (Signed)
Electronic refill Last refill 09/27/2015 Last office visit 12/03/16

## 2016-12-24 ENCOUNTER — Ambulatory Visit: Payer: Medicare Other

## 2016-12-24 ENCOUNTER — Telehealth: Payer: Self-pay | Admitting: Family Medicine

## 2016-12-24 DIAGNOSIS — Z952 Presence of prosthetic heart valve: Secondary | ICD-10-CM

## 2016-12-24 DIAGNOSIS — Z5181 Encounter for therapeutic drug level monitoring: Secondary | ICD-10-CM | POA: Diagnosis not present

## 2016-12-24 LAB — POCT INR: INR: 2.7

## 2016-12-24 NOTE — Telephone Encounter (Signed)
Patient dropped off Hand[employer] form to be filled out by Dr.Duncan.  Patient asked for form to be mailed to him in the envelope he provided when form is completed.  Patient said he doesn't need to be called when form is completed. Form in rx tower.

## 2016-12-24 NOTE — Telephone Encounter (Signed)
Placed in Dr. Duncan's In Box. 

## 2016-12-24 NOTE — Patient Instructions (Signed)
INR today 2.7  Patient doing well without any changes to diet, medications or general health.  Continue taking 4mg  daily EXCEPT for 2mg  on Mondays and Fridays.  Recheck in 4 weeks.

## 2016-12-25 NOTE — Progress Notes (Signed)
Agree. Thanks

## 2016-12-25 NOTE — Telephone Encounter (Signed)
I'll work on the hard copy.  Thanks.  

## 2016-12-30 NOTE — Telephone Encounter (Signed)
Form mailed to patient as requested.

## 2017-01-14 ENCOUNTER — Other Ambulatory Visit: Payer: Self-pay | Admitting: Family Medicine

## 2017-01-14 NOTE — Telephone Encounter (Signed)
Electronic refill request. Benzonatate Last office visit:   12/03/2016 Last Filled:   ? Please advise.

## 2017-01-15 NOTE — Telephone Encounter (Signed)
Sent. Thanks.   

## 2017-01-17 ENCOUNTER — Ambulatory Visit: Payer: Medicare Other

## 2017-01-23 ENCOUNTER — Ambulatory Visit (INDEPENDENT_AMBULATORY_CARE_PROVIDER_SITE_OTHER): Payer: Medicare Other | Admitting: General Practice

## 2017-01-23 DIAGNOSIS — I4891 Unspecified atrial fibrillation: Secondary | ICD-10-CM | POA: Diagnosis not present

## 2017-01-23 DIAGNOSIS — Z952 Presence of prosthetic heart valve: Secondary | ICD-10-CM | POA: Diagnosis not present

## 2017-01-23 DIAGNOSIS — Z7901 Long term (current) use of anticoagulants: Secondary | ICD-10-CM | POA: Diagnosis not present

## 2017-01-23 DIAGNOSIS — Z5181 Encounter for therapeutic drug level monitoring: Secondary | ICD-10-CM | POA: Diagnosis not present

## 2017-01-23 LAB — POCT INR: INR: 2.3

## 2017-01-23 NOTE — Patient Instructions (Addendum)
Pre visit review using our clinic review tool, if applicable. No additional management support is needed unless otherwise documented below in the visit note.  Continue taking 4mg  daily EXCEPT for 2mg  on Mondays and Fridays.  Recheck in 4 weeks

## 2017-01-24 NOTE — Progress Notes (Signed)
Agree. Thanks

## 2017-02-20 ENCOUNTER — Ambulatory Visit (INDEPENDENT_AMBULATORY_CARE_PROVIDER_SITE_OTHER): Payer: Medicare Other | Admitting: General Practice

## 2017-02-20 DIAGNOSIS — Z952 Presence of prosthetic heart valve: Secondary | ICD-10-CM

## 2017-02-20 DIAGNOSIS — Z7901 Long term (current) use of anticoagulants: Secondary | ICD-10-CM

## 2017-02-20 DIAGNOSIS — I4891 Unspecified atrial fibrillation: Secondary | ICD-10-CM | POA: Diagnosis not present

## 2017-02-20 LAB — POCT INR: INR: 2

## 2017-02-20 NOTE — Progress Notes (Signed)
Agreed.  Thanks.  

## 2017-02-20 NOTE — Patient Instructions (Addendum)
Pre visit review using our clinic review tool, if applicable. No additional management support is needed unless otherwise documented below in the visit note.  Continue taking 4mg  daily EXCEPT for 2mg  on Mondays and Fridays.  Recheck in 4 weeks.

## 2017-03-25 ENCOUNTER — Other Ambulatory Visit: Payer: Self-pay | Admitting: Family Medicine

## 2017-04-03 ENCOUNTER — Ambulatory Visit: Payer: Medicare Other | Admitting: General Practice

## 2017-04-03 DIAGNOSIS — I4891 Unspecified atrial fibrillation: Secondary | ICD-10-CM

## 2017-04-03 DIAGNOSIS — Z952 Presence of prosthetic heart valve: Secondary | ICD-10-CM | POA: Diagnosis not present

## 2017-04-03 DIAGNOSIS — Z7901 Long term (current) use of anticoagulants: Secondary | ICD-10-CM

## 2017-04-03 LAB — POCT INR: INR: 2.1

## 2017-04-03 NOTE — Patient Instructions (Addendum)
Pre visit review using our clinic review tool, if applicable. No additional management support is needed unless otherwise documented below in the visit note.  Continue taking 4mg  daily EXCEPT for 2mg  on Mondays and Fridays.  Recheck in 6 weeks.

## 2017-04-05 NOTE — Progress Notes (Signed)
Agree. Thanks

## 2017-04-17 ENCOUNTER — Ambulatory Visit (INDEPENDENT_AMBULATORY_CARE_PROVIDER_SITE_OTHER)
Admission: RE | Admit: 2017-04-17 | Discharge: 2017-04-17 | Disposition: A | Discharge status: Home / Self Care | Payer: Medicare Other | Source: Ambulatory Visit | Attending: Family Medicine | Admitting: Family Medicine

## 2017-04-17 ENCOUNTER — Encounter: Payer: Self-pay | Admitting: Family Medicine

## 2017-04-17 ENCOUNTER — Ambulatory Visit: Payer: Medicare Other | Admitting: Family Medicine

## 2017-04-17 VITALS — BP 158/72 | HR 65 | Temp 99.0°F | Wt 240.0 lb

## 2017-04-17 DIAGNOSIS — R05 Cough: Secondary | ICD-10-CM

## 2017-04-17 DIAGNOSIS — R062 Wheezing: Secondary | ICD-10-CM | POA: Diagnosis not present

## 2017-04-17 DIAGNOSIS — R059 Cough, unspecified: Secondary | ICD-10-CM

## 2017-04-17 MED ORDER — ALBUTEROL SULFATE (2.5 MG/3ML) 0.083% IN NEBU: 2.5000 mg | INHALATION_SOLUTION | Freq: Four times a day (QID) | RESPIRATORY_TRACT | 1 refills | Status: DC | PRN

## 2017-04-17 MED ORDER — PREDNISONE 10 MG PO TABS: ORAL_TABLET | ORAL | 0 refills | Status: DC

## 2017-04-17 MED ORDER — DOXYCYCLINE HYCLATE 100 MG PO TABS: 100.0000 mg | ORAL_TABLET | Freq: Two times a day (BID) | ORAL | 0 refills | Status: DC

## 2017-04-17 MED ORDER — ALBUTEROL SULFATE (2.5 MG/3ML) 0.083% IN NEBU
2.5000 mg | INHALATION_SOLUTION | Freq: Once | RESPIRATORY_TRACT | Status: AC
  Administered 2017-04-17: 2.5 mg via RESPIRATORY_TRACT

## 2017-04-17 NOTE — Progress Notes (Signed)
He had blood in stool and is off aspirin.  Still on warfarin at baseline.    Sick for the last 8 days.  First noted. ST, cough, voice is hoarse.  Cough continues.  Tried tessalon and otc meds for cough w/o relief.  He used his wife's nebs with some relief.  Facial congestion.  Fever noted about 7 days ago, not since then.    Meds, vitals, and allergies reviewed.   ROS: Per HPI unless specifically indicated in ROS section   GEN: nad, alert and oriented HEENT: mucous membranes moist, tm w/o erythema, nasal exam w/o erythema, clear discharge noted,  OP with cobblestoning NECK: supple w/o LA CV: rrr.   PULM: ctab except for LLL rhonchi, no inc wob EXT: no edema SKIN: no acute rash Voice is hoarse.   Sinuses not ttp  ============================== Recheck after nebulized albuterol.  He felt better.  Cough is better.  Recheck pulmonary exam shows increased air movement.  He does have some slightly lateral rhonchi but I think this is actually due to increased air movement.  He clearly felt better after nebulizer.

## 2017-04-17 NOTE — Patient Instructions (Addendum)
Start prednisone with food, use the nebulizer if needed, and start doxy.   Change your coumadin to 2 mg a day in the meantime and recheck here next week with a follow up INR.   If worse then go to the ER.  Take care.  Glad to see you.

## 2017-04-18 NOTE — Assessment & Plan Note (Signed)
Presumed bronchitis.  Chest x-ray independently reviewed.  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  Findings discussed with patient.  See notes on imaging.  At this point he looks like he is still okay for outpatient follow-up.  He agrees.  He did not feel like he needed to be at the emergency room.  I do not think he needs emergency room evaluation either.  Start doxycycline.  Routine cautions given for Coumadin.  Decrease Coumadin to 2 mg a day.  I notified Caren Griffins with the anticoagulation clinic.  He will have follow-up regarding his INR next week.  Routine steroid cautions given.  Start prednisone with taper as described on med list.  Use albuterol nebulizer at home as needed.  He already has a nebulizer machine to use, so I just sent the prescription for the nebulizer vials.  Update me as needed.  He agrees.  If dramatic worsening of symptoms then go to emergency room. >25 minutes spent in face to face time with patient, >50% spent in counselling or coordination of care.

## 2017-04-22 ENCOUNTER — Ambulatory Visit: Payer: Medicare Other

## 2017-04-22 DIAGNOSIS — Z952 Presence of prosthetic heart valve: Secondary | ICD-10-CM

## 2017-04-22 DIAGNOSIS — Z7901 Long term (current) use of anticoagulants: Secondary | ICD-10-CM

## 2017-04-22 DIAGNOSIS — I4891 Unspecified atrial fibrillation: Secondary | ICD-10-CM

## 2017-04-22 LAB — POCT INR: INR: 2.9

## 2017-04-22 NOTE — Patient Instructions (Signed)
INR today 2.9  *patient is taking 2mg  daily while on doxycycline and prednisone.  He will finish both on 3/31.    Patient is to continue on 2mg  daily while on above therapy and we will recheck in 1 week (04/29/17) to give further dosing orders.    Patient verbalizes understanding of all instructions given today.

## 2017-04-23 NOTE — Progress Notes (Signed)
Agree. Thanks

## 2017-04-29 ENCOUNTER — Ambulatory Visit: Payer: Medicare Other

## 2017-04-29 DIAGNOSIS — I4891 Unspecified atrial fibrillation: Secondary | ICD-10-CM

## 2017-04-29 DIAGNOSIS — Z952 Presence of prosthetic heart valve: Secondary | ICD-10-CM

## 2017-04-29 DIAGNOSIS — Z7901 Long term (current) use of anticoagulants: Secondary | ICD-10-CM | POA: Diagnosis not present

## 2017-04-29 LAB — POCT INR: INR: 1.7

## 2017-04-29 NOTE — Patient Instructions (Signed)
INR today 1.7  *patient has finished the doxycyline and the prednisone.   Patient is to take 6mg  today (4/2) for a boost and then resume his prior dosing before abx/steriod therapy of taking 4mg  daily EXCEPT for 2 mg on Mondays and Fridays.  Recheck in 2 weeks.   Patient verbalizes understanding of all instructions given today.

## 2017-04-30 NOTE — Progress Notes (Signed)
Agree. Thanks

## 2017-05-08 ENCOUNTER — Other Ambulatory Visit: Payer: Self-pay | Admitting: Family Medicine

## 2017-05-08 NOTE — Telephone Encounter (Signed)
Electronic refill request. Benzonatate Last office visit:   04/17/2017 Last Filled:

## 2017-05-09 NOTE — Telephone Encounter (Signed)
Sent. Thanks.  F/u if cough isn't getting better.

## 2017-05-09 NOTE — Telephone Encounter (Signed)
Left detailed message on voicemail.  

## 2017-05-15 ENCOUNTER — Ambulatory Visit: Payer: Medicare Other | Admitting: General Practice

## 2017-05-15 DIAGNOSIS — Z7901 Long term (current) use of anticoagulants: Secondary | ICD-10-CM | POA: Diagnosis not present

## 2017-05-15 DIAGNOSIS — Z952 Presence of prosthetic heart valve: Secondary | ICD-10-CM | POA: Diagnosis not present

## 2017-05-15 DIAGNOSIS — I4891 Unspecified atrial fibrillation: Secondary | ICD-10-CM

## 2017-05-15 LAB — POCT INR: INR: 3

## 2017-05-15 NOTE — Patient Instructions (Addendum)
Pre visit review using our clinic review tool, if applicable. No additional management support is needed unless otherwise documented below in the visit note.  Continue to take 4 mg daily except 2 mg on Monday and Fridays.  Re-check 4 weeks.  

## 2017-05-17 ENCOUNTER — Other Ambulatory Visit: Payer: Self-pay | Admitting: Family Medicine

## 2017-05-17 NOTE — Progress Notes (Signed)
Agree. Thanks

## 2017-05-20 NOTE — Telephone Encounter (Signed)
Patient is compliant with coumadin management will refill X 6 months.   

## 2017-05-23 ENCOUNTER — Other Ambulatory Visit: Payer: Self-pay | Admitting: Family Medicine

## 2017-05-28 ENCOUNTER — Other Ambulatory Visit (INDEPENDENT_AMBULATORY_CARE_PROVIDER_SITE_OTHER): Payer: Medicare Other

## 2017-05-28 DIAGNOSIS — E119 Type 2 diabetes mellitus without complications: Secondary | ICD-10-CM

## 2017-05-28 LAB — COMPREHENSIVE METABOLIC PANEL
ALT: 27 U/L (ref 0–53)
AST: 34 U/L (ref 0–37)
Albumin: 4 g/dL (ref 3.5–5.2)
Alkaline Phosphatase: 58 U/L (ref 39–117)
BUN: 10 mg/dL (ref 6–23)
CALCIUM: 9 mg/dL (ref 8.4–10.5)
CHLORIDE: 101 meq/L (ref 96–112)
CO2: 26 meq/L (ref 19–32)
Creatinine, Ser: 0.94 mg/dL (ref 0.40–1.50)
GFR: 85.54 mL/min (ref >60.00)
Glucose, Bld: 173 mg/dL — ABNORMAL HIGH (ref 70–99)
Potassium: 4 mEq/L (ref 3.5–5.1)
Sodium: 135 mEq/L (ref 135–145)
Total Bilirubin: 0.9 mg/dL (ref 0.2–1.2)
Total Protein: 7 g/dL (ref 6.0–8.3)

## 2017-05-28 LAB — LIPID PANEL
CHOL/HDL RATIO: 3
Cholesterol: 153 mg/dL (ref 0–200)
HDL: 44 mg/dL (ref >39.00)
LDL Cholesterol: 85 mg/dL (ref 0–99)
NonHDL: 109.17
TRIGLYCERIDES: 122 mg/dL (ref 0.0–149.0)
VLDL: 24.4 mg/dL (ref 0.0–40.0)

## 2017-05-28 LAB — HEMOGLOBIN A1C: Hgb A1c MFr Bld: 9.3 % — ABNORMAL HIGH (ref 4.6–6.5)

## 2017-06-02 ENCOUNTER — Encounter: Payer: Self-pay | Admitting: Family Medicine

## 2017-06-02 ENCOUNTER — Ambulatory Visit: Payer: Medicare Other | Admitting: Family Medicine

## 2017-06-02 ENCOUNTER — Other Ambulatory Visit: Payer: Self-pay | Admitting: Family Medicine

## 2017-06-02 VITALS — BP 144/76 | HR 65 | Temp 97.6°F | Ht 69.5 in | Wt 239.5 lb

## 2017-06-02 DIAGNOSIS — E119 Type 2 diabetes mellitus without complications: Secondary | ICD-10-CM

## 2017-06-02 DIAGNOSIS — Z23 Encounter for immunization: Secondary | ICD-10-CM | POA: Diagnosis not present

## 2017-06-02 MED ORDER — GLIPIZIDE 5 MG PO TABS: 5.0000 mg | ORAL_TABLET | Freq: Two times a day (BID) | ORAL | Status: DC

## 2017-06-02 NOTE — Progress Notes (Signed)
Diabetes:  Using medications without difficulties: yes Hypoglycemic episodes: no Hyperglycemic episodes: he noted some inc in urination, partly related to lasix use.   Feet problems: no Blood Sugars averaging: 150-250 eye exam within last year: yes A1c d/w pt.   Up from prev.   H/o pancreatitis noted, in the past.   He has been "not 100%" on his diet.  D/w pt.   Lipids d/w pt.  Reasonable lipids for now except for LDL slightly above goal, with A1c likely more important at this point.     Meds, vitals, and allergies reviewed.   ROS: Per HPI unless specifically indicated in ROS section   GEN: nad, alert and oriented, hard of hearing at baseline.   HEENT: mucous membranes moist NECK: supple w/o LA CV: rrr. Valve click noted as expected.  PULM: ctab, no inc wob ABD: soft, +bs EXT: no edema SKIN: no acute rash  Diabetic foot exam: Normal inspection No skin breakdown No calluses  Normal DP pulses Normal sensation to light touch and monofilament Nails normal

## 2017-06-02 NOTE — Patient Instructions (Signed)
Work on diet and exercise in the meantime.  Increase glipizide to 1 tab twice a day, just before breakfast and supper.  Check sugar in the AM and then 2 hours later.  If persistently >200 then let me know.  If better, then continue as is.  Recheck in 3 months at visit here at the office.  The only lab you need to have done for your next diabetic visit is an A1c.  We can do this with a fingerstick test at the office visit.  You do not need a lab visit ahead of time for this.  It does not matter if you are fasting when the lab is done.   Take care.  Glad to see you.  Update me as needed.

## 2017-06-02 NOTE — Addendum Note (Signed)
Addended by: Josetta Huddle on: 06/02/2017 08:55 AM   Modules accepted: Orders

## 2017-06-02 NOTE — Assessment & Plan Note (Signed)
PNA-13 today.  D/w pt.  Normal foot exam.  A1c up.  Diet has been off. D/w pt.   Work on diet and exercise in the meantime.  Increase glipizide to 1 tab twice a day, just before breakfast and supper.  Check sugar in the AM and then 2 hours later.  If persistently >200 then he'll let me know.  If better, then continue as is.  Recheck in 3 months with A1c at OV.  He agrees.

## 2017-06-06 ENCOUNTER — Other Ambulatory Visit: Payer: Self-pay | Admitting: Family Medicine

## 2017-06-12 ENCOUNTER — Ambulatory Visit: Payer: Medicare Other

## 2017-07-07 ENCOUNTER — Other Ambulatory Visit: Payer: Self-pay | Admitting: Family Medicine

## 2017-07-17 ENCOUNTER — Ambulatory Visit: Payer: Medicare Other

## 2017-07-24 ENCOUNTER — Ambulatory Visit: Payer: Medicare Other | Admitting: General Practice

## 2017-07-24 DIAGNOSIS — Z7901 Long term (current) use of anticoagulants: Secondary | ICD-10-CM | POA: Diagnosis not present

## 2017-07-24 DIAGNOSIS — I4891 Unspecified atrial fibrillation: Secondary | ICD-10-CM

## 2017-07-24 DIAGNOSIS — Z952 Presence of prosthetic heart valve: Secondary | ICD-10-CM

## 2017-07-24 LAB — POCT INR: INR: 2.6 (ref 2.0–3.0)

## 2017-07-27 NOTE — Progress Notes (Signed)
Agree. Thanks

## 2017-09-02 ENCOUNTER — Ambulatory Visit: Payer: Medicare Other | Admitting: Family Medicine

## 2017-09-04 ENCOUNTER — Encounter: Payer: Self-pay | Admitting: Family Medicine

## 2017-09-04 ENCOUNTER — Ambulatory Visit: Payer: Medicare Other | Admitting: Family Medicine

## 2017-09-04 ENCOUNTER — Ambulatory Visit (INDEPENDENT_AMBULATORY_CARE_PROVIDER_SITE_OTHER): Payer: Medicare Other | Admitting: General Practice

## 2017-09-04 VITALS — BP 156/82 | HR 65 | Temp 98.4°F | Ht 69.5 in | Wt 247.2 lb

## 2017-09-04 DIAGNOSIS — E119 Type 2 diabetes mellitus without complications: Secondary | ICD-10-CM

## 2017-09-04 DIAGNOSIS — Z7901 Long term (current) use of anticoagulants: Secondary | ICD-10-CM

## 2017-09-04 DIAGNOSIS — Z952 Presence of prosthetic heart valve: Secondary | ICD-10-CM

## 2017-09-04 DIAGNOSIS — I4891 Unspecified atrial fibrillation: Secondary | ICD-10-CM

## 2017-09-04 LAB — POCT GLYCOSYLATED HEMOGLOBIN (HGB A1C): HEMOGLOBIN A1C: 7.4 % — AB (ref 4.0–5.6)

## 2017-09-04 LAB — POCT INR: INR: 3.3 — AB (ref 2.0–3.0)

## 2017-09-04 MED ORDER — GLIPIZIDE 5 MG PO TABS: 5.0000 mg | ORAL_TABLET | Freq: Every day | ORAL | Status: DC

## 2017-09-04 NOTE — Patient Instructions (Addendum)
Pre visit review using our clinic review tool, if applicable. No additional management support is needed unless otherwise documented below in the visit note.  Continue to take 4 mg daily except 2 mg on Monday and Fridays.  Re-check 4 weeks.  

## 2017-09-04 NOTE — Patient Instructions (Addendum)
Keep your coumadin appointment as scheduled.    Call about an eye exam when possible.  Stop the glipizide.  Recheck in about 3-4 months for diabetes.   The only lab you need to have done for your next diabetic visit is an A1c.  We can do this with a fingerstick test at the office visit.  You do not need a lab visit ahead of time for this.  It does not matter if you are fasting when the lab is done.   Take care.  Glad to see you.

## 2017-09-04 NOTE — Progress Notes (Signed)
Diabetes:  Using medications without difficulties: yes but see below.  He has been on 5mg  glipizide a day.  Still on metformin.   Hypoglycemic episodes: down to 70s and 80s - he has been more active recently Hyperglycemic episodes: no Feet problems:no Blood Sugars averaging: usually ~140s in the AM prior to meds.   eye exam within last year:due, d/w pt.  He can call about that.   A1c at 7.4 down from 9.3.    His weight was up a little bit prev.  He was eating more carbs recently.  Some of that was stress eating, with his wife's aunt recently passing away.  They had been caring for her at home.  He can check BP at home and update me if persistently elevated.    Still on coumadin.  He has his plans about coumadin dosing based on INR today.    Meds, vitals, and allergies reviewed.   ROS: Per HPI unless specifically indicated in ROS section   GEN: nad, alert and oriented HEENT: mucous membranes moist NECK: supple w/o LA CV: rrr. PULM: ctab, no inc wob ABD: soft, +bs EXT: no edema SKIN: no acute rash

## 2017-09-07 NOTE — Progress Notes (Signed)
Agree. Thanks

## 2017-09-07 NOTE — Assessment & Plan Note (Addendum)
A1c at 7.4 down from 9.3.  Stop glipizide to limit risk of hypoglycemia. Clearly improved by A1c.  He has been doing more stress eating recently, see above about caring for his wife's aunt, who recently died.  He will update me if his blood pressure is not improved in the near future.  I thanked him for his effort, with him helping care for his extended family.  See after visit summary.  He agrees with plan.

## 2017-10-02 ENCOUNTER — Ambulatory Visit: Payer: Medicare Other

## 2017-10-02 LAB — HM DIABETES EYE EXAM

## 2017-10-10 ENCOUNTER — Encounter: Payer: Self-pay | Admitting: Family Medicine

## 2017-10-23 ENCOUNTER — Ambulatory Visit: Payer: Medicare Other | Admitting: General Practice

## 2017-10-23 DIAGNOSIS — Z7901 Long term (current) use of anticoagulants: Secondary | ICD-10-CM

## 2017-10-23 DIAGNOSIS — Z952 Presence of prosthetic heart valve: Secondary | ICD-10-CM

## 2017-10-23 DIAGNOSIS — I4891 Unspecified atrial fibrillation: Secondary | ICD-10-CM

## 2017-10-23 LAB — POCT INR: INR: 2.1 (ref 2.0–3.0)

## 2017-10-23 NOTE — Patient Instructions (Addendum)
Pre visit review using our clinic review tool, if applicable. No additional management support is needed unless otherwise documented below in the visit note.  Continue to take 4 mg daily except 2 mg on Monday and Fridays.  Re-check 4 weeks.  

## 2017-10-26 NOTE — Progress Notes (Signed)
Agree. Thanks

## 2017-11-20 ENCOUNTER — Ambulatory Visit: Payer: Medicare Other | Admitting: General Practice

## 2017-11-20 DIAGNOSIS — Z7901 Long term (current) use of anticoagulants: Secondary | ICD-10-CM | POA: Diagnosis not present

## 2017-11-20 DIAGNOSIS — I4891 Unspecified atrial fibrillation: Secondary | ICD-10-CM

## 2017-11-20 DIAGNOSIS — Z952 Presence of prosthetic heart valve: Secondary | ICD-10-CM

## 2017-11-20 LAB — POCT INR: INR: 1.9 — AB (ref 2.0–3.0)

## 2017-11-20 NOTE — Patient Instructions (Addendum)
Pre visit review using our clinic review tool, if applicable. No additional management support is needed unless otherwise documented below in the visit note.  Take 6 mg today and then continue to take 4 mg daily except 2 mg on Monday and Fridays.  Re-check 4 weeks.

## 2017-11-23 NOTE — Progress Notes (Signed)
Agree. Thanks

## 2017-11-25 ENCOUNTER — Other Ambulatory Visit: Payer: Self-pay | Admitting: Family Medicine

## 2017-11-25 NOTE — Telephone Encounter (Signed)
Patient is compliant with coumadin management.  Will refill x 6 months.

## 2017-12-04 ENCOUNTER — Ambulatory Visit: Payer: Medicare Other

## 2017-12-10 ENCOUNTER — Ambulatory Visit (INDEPENDENT_AMBULATORY_CARE_PROVIDER_SITE_OTHER): Payer: Medicare Other

## 2017-12-10 ENCOUNTER — Other Ambulatory Visit: Payer: Self-pay | Admitting: Family Medicine

## 2017-12-10 VITALS — BP 160/80 | HR 62 | Temp 97.8°F | Ht 71.0 in | Wt 244.5 lb

## 2017-12-10 DIAGNOSIS — Z0000 Encounter for general adult medical examination without abnormal findings: Secondary | ICD-10-CM

## 2017-12-10 DIAGNOSIS — E119 Type 2 diabetes mellitus without complications: Secondary | ICD-10-CM

## 2017-12-10 DIAGNOSIS — Z23 Encounter for immunization: Secondary | ICD-10-CM

## 2017-12-10 LAB — CBC WITH DIFFERENTIAL/PLATELET
Basophils Absolute: 0 10*3/uL (ref 0.0–0.1)
Basophils Relative: 0.8 % (ref 0.0–3.0)
EOS PCT: 3.7 % (ref 0.0–5.0)
Eosinophils Absolute: 0.2 10*3/uL (ref 0.0–0.7)
HEMATOCRIT: 42.9 % (ref 39.0–52.0)
HEMOGLOBIN: 15 g/dL (ref 13.0–17.0)
LYMPHS ABS: 1.7 10*3/uL (ref 0.7–4.0)
Lymphocytes Relative: 35 % (ref 12.0–46.0)
MCHC: 35.1 g/dL (ref 30.0–36.0)
MCV: 92 fl (ref 78.0–100.0)
Monocytes Absolute: 0.5 10*3/uL (ref 0.1–1.0)
Monocytes Relative: 10.6 % (ref 3.0–12.0)
NEUTROS ABS: 2.4 10*3/uL (ref 1.4–7.7)
Neutrophils Relative %: 49.9 % (ref 43.0–77.0)
Platelets: 145 10*3/uL — ABNORMAL LOW (ref 150.0–400.0)
RBC: 4.66 Mil/uL (ref 4.22–5.81)
RDW: 14 % (ref 11.5–15.5)
WBC: 4.8 10*3/uL (ref 4.0–10.5)

## 2017-12-10 LAB — LIPID PANEL
Cholesterol: 155 mg/dL (ref 0–200)
HDL: 43 mg/dL (ref >39.00)
LDL CALC: 92 mg/dL (ref 0–99)
NonHDL: 112.27
TRIGLYCERIDES: 102 mg/dL (ref 0.0–149.0)
Total CHOL/HDL Ratio: 4
VLDL: 20.4 mg/dL (ref 0.0–40.0)

## 2017-12-10 LAB — COMPREHENSIVE METABOLIC PANEL
ALBUMIN: 4.3 g/dL (ref 3.5–5.2)
ALK PHOS: 65 U/L (ref 39–117)
ALT: 27 U/L (ref 0–53)
AST: 28 U/L (ref 0–37)
BUN: 13 mg/dL (ref 6–23)
CALCIUM: 9.5 mg/dL (ref 8.4–10.5)
CHLORIDE: 104 meq/L (ref 96–112)
CO2: 28 mEq/L (ref 19–32)
Creatinine, Ser: 1 mg/dL (ref 0.40–1.50)
GFR: 79.51 mL/min (ref >60.00)
Glucose, Bld: 173 mg/dL — ABNORMAL HIGH (ref 70–99)
POTASSIUM: 4.6 meq/L (ref 3.5–5.1)
Sodium: 138 mEq/L (ref 135–145)
Total Bilirubin: 0.8 mg/dL (ref 0.2–1.2)
Total Protein: 7.1 g/dL (ref 6.0–8.3)

## 2017-12-10 LAB — HEMOGLOBIN A1C: Hgb A1c MFr Bld: 8.4 % — ABNORMAL HIGH (ref 4.6–6.5)

## 2017-12-10 LAB — TSH: TSH: 1.57 u[IU]/mL (ref 0.35–4.50)

## 2017-12-10 NOTE — Patient Instructions (Signed)
Mr. Elting , Thank you for taking time to come for your Medicare Wellness Visit. I appreciate your ongoing commitment to your health goals. Please review the following plan we discussed and let me know if I can assist you in the future.   These are the goals we discussed: Goals    . Increase water intake     Starting 12/10/2017, I will attempt to drink at least 6-8 glasses of water daily.        This is a list of the screening recommended for you and due dates:  Health Maintenance  Topic Date Due  . Complete foot exam   06/03/2018  . Pneumonia vaccines (2 of 2 - PPSV23) 06/03/2018  . Hemoglobin A1C  06/10/2018  . Eye exam for diabetics  10/03/2018  . Colon Cancer Screening  07/10/2021  . Tetanus Vaccine  04/02/2023  . Flu Shot  Completed  .  Hepatitis C: One time screening is recommended by Center for Disease Control  (CDC) for  adults born from 40 through 1965.   Completed  . HIV Screening  Completed   Preventive Care for Adults  A healthy lifestyle and preventive care can promote health and wellness. Preventive health guidelines for adults include the following key practices.  . A routine yearly physical is a good way to check with your health care provider about your health and preventive screening. It is a chance to share any concerns and updates on your health and to receive a thorough exam.  . Visit your dentist for a routine exam and preventive care every 6 months. Brush your teeth twice a day and floss once a day. Good oral hygiene prevents tooth decay and gum disease.  . The frequency of eye exams is based on your age, health, family medical history, use  of contact lenses, and other factors. Follow your health care provider's recommendations for frequency of eye exams.  . Eat a healthy diet. Foods like vegetables, fruits, whole grains, low-fat dairy products, and lean protein foods contain the nutrients you need without too many calories. Decrease your intake of  foods high in solid fats, added sugars, and salt. Eat the right amount of calories for you. Get information about a proper diet from your health care provider, if necessary.  . Regular physical exercise is one of the most important things you can do for your health. Most adults should get at least 150 minutes of moderate-intensity exercise (any activity that increases your heart rate and causes you to sweat) each week. In addition, most adults need muscle-strengthening exercises on 2 or more days a week.  Silver Sneakers may be a benefit available to you. To determine eligibility, you may visit the website: www.silversneakers.com or contact program at (660)632-7130 Mon-Fri between 8AM-8PM.   . Maintain a healthy weight. The body mass index (BMI) is a screening tool to identify possible weight problems. It provides an estimate of body fat based on height and weight. Your health care provider can find your BMI and can help you achieve or maintain a healthy weight.   For adults 20 years and older: ? A BMI below 18.5 is considered underweight. ? A BMI of 18.5 to 24.9 is normal. ? A BMI of 25 to 29.9 is considered overweight. ? A BMI of 30 and above is considered obese.   . Maintain normal blood lipids and cholesterol levels by exercising and minimizing your intake of saturated fat. Eat a balanced diet with plenty of fruit and  vegetables. Blood tests for lipids and cholesterol should begin at age 30 and be repeated every 5 years. If your lipid or cholesterol levels are high, you are over 50, or you are at high risk for heart disease, you may need your cholesterol levels checked more frequently. Ongoing high lipid and cholesterol levels should be treated with medicines if diet and exercise are not working.  . If you smoke, find out from your health care provider how to quit. If you do not use tobacco, please do not start.  . If you choose to drink alcohol, please do not consume more than 2 drinks per  day. One drink is considered to be 12 ounces (355 mL) of beer, 5 ounces (148 mL) of wine, or 1.5 ounces (44 mL) of liquor.  . If you are 23-39 years old, ask your health care provider if you should take aspirin to prevent strokes.  . Use sunscreen. Apply sunscreen liberally and repeatedly throughout the day. You should seek shade when your shadow is shorter than you. Protect yourself by wearing long sleeves, pants, a wide-brimmed hat, and sunglasses year round, whenever you are outdoors.  . Once a month, do a whole body skin exam, using a mirror to look at the skin on your back. Tell your health care provider of new moles, moles that have irregular borders, moles that are larger than a pencil eraser, or moles that have changed in shape or color.

## 2017-12-10 NOTE — Progress Notes (Signed)
PCP notes:   Health maintenance:  A1C - completed Flu vaccine - administered  Abnormal screenings:   None  Patient concerns:   None  Nurse concerns:  BP elevated @ 160/90. Per pt, medication has not been taken.   Next PCP appt:   12/11/17 @ 1045

## 2017-12-10 NOTE — Progress Notes (Signed)
   Subjective:    Patient ID: [name] [name], male    DOB: September 21, 1950, 67 y.o.   MRN: [id number]  HPI I reviewed health advisor's note, was available for consultation, and agree with documentation and plan.    Review of Systems     Objective:   Physical Exam         Assessment & Plan:

## 2017-12-10 NOTE — Progress Notes (Signed)
Subjective:   KALAI BACA is a 67 y.o. male who presents for Medicare Annual/Subsequent preventive examination.  Review of Systems:  N/A Cardiac Risk Factors include: advanced age (>62mn, >>79women);diabetes mellitus;male gender;none;dyslipidemia;obesity (BMI >30kg/m2)     Objective:    Vitals: BP (!) 160/80 (BP Location: Left Arm, Patient Position: Sitting, Cuff Size: Large) Comment: BP medication not taken  Pulse 62   Temp 97.8 F (36.6 C) (Oral)   Ht 5' 11" (1.803 m)   Wt 244 lb 8 oz (110.9 kg)   SpO2 96%   BMI 34.10 kg/m   Body mass index is 34.1 kg/m.  Advanced Directives 12/10/2017 12/02/2016 02/06/2016 01/22/2016 12/05/2010  Does Patient Have a Medical Advance Directive? Yes No No No Patient has advance directive, copy in chart  Type of Advance Directive [place] - - - [place]  Copy of [place] Chart? Yes - validated most recent copy scanned in chart (See row information) - - - -  Would patient like information on creating a medical advance directive? - - No - Patient declined No - Patient declined -    Tobacco Social History   Tobacco Use  Smoking Status Never Smoker  Smokeless Tobacco Never Used     Counseling given: No   Clinical Intake:  Pre-visit preparation completed: Yes  Pain : No/denies pain Pain Score: 0-No pain     Nutritional Status: BMI > 30  Obese Nutritional Risks: None Diabetes: Yes CBG done?: No Did pt. bring in CBG monitor from home?: No  How often do you need to have someone help you when you read instructions, pamphlets, or other written materials from your doctor or pharmacy?: 1 - Never What is the last grade level you completed in school?: 12th grade + 2 yrs college  Interpreter Needed?: No  Comments: pt lives with spouse Information entered by :: LPinson, LPN  Past Medical History:  Diagnosis Date  . Acute pancreatitis    with  pancreatic necrosis  . Anemia   . Aortic insufficiency    with bicuspid aortic valve (the last MRI demonstrated the aortic root to be 4.8 x 4.7 cm.) Mod regurgitation. Normal chamber size with very mild left ventricular dysfunction.)  . Blood transfusion   . [place]F ([place]estive heart failure) [place]   . Cough 06/28/2009   Dr. WMelvyn Novas . Diabetes mellitus without complication ([place]   . Duodenal ulcer    with GI bleeding  . Dyslipidemia   . GERD (gastroesophageal reflux disease) 03/23/2001   egd w/ dilation....Marland KitchenMarland KitchenDeatra Ina . Hemorrhoids   . Hypertension   . 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 Orthostasis 08/2009   Fever and syncope, no source for fever on cultures.  . Pericardial effusion 07/2009   [facility]  . Pleural effusion 10/2009   s/p thoracentesis  . PNA (pneumonia) 10/2009   [place], s/p thoracentesis  . Pneumonia 2011   "twice"  . Syncope 08/2009   admission for fever and syncope, no source for fever seen on cultures. Syncope thought to be r/t orthostasis   Past Surgical History:  Procedure Laterality Date  . 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 Cardiolite EKG  03/10/2003   (-) ? small/apical infarct EF 50%  . DOPPLER ECHOCARDIOGRAPHY  01/18/97   LVH, mild dilated aorta - root mod  to severe aortic regurg EF 50-55%  . DOPPLER ECHOCARDIOGRAPHY  09/27/03   No changes, moderate severe aortic regurg  . DOPPLER ECHOCARDIOGRAPHY  07/09/2004   Mod severe aortic regurg 2-3+ T.R., T.I.R., mild P..R.  . DOPPLER ECHOCARDIOGRAPHY  04/01/06   Hypokin Apex EF 55%  . DOPPLER ECHOCARDIOGRAPHY  02/15/2010   Mild LVH, mild decr Sys fctn EF 40-45% Mild Dias Dysfctn Mech AV Triv AR, MR  . ESOPHAGOGASTRODUODENOSCOPY  1990, 1999   Hot dog in throat (1987)/ Chicken in throat (1990)/ 01/06/97 (Dr. Earlean Shawl) Impaction, dilatation, Schatzki's Ring/ 07/1998 repeat Dilatation/  01/10/01 stricture/dilated/ 03/08/05 Schatzki's Ring, dilated/ 06/01/08 Food Disimpaction [name] Bulb Ulcer (Dr. Watt Climes)  . ESOPHAGOGASTRODUODENOSCOPY  01/06/1997   (Dr. Earlean Shawl) Impaction, dilatation, Schatzki's Ring  . ESOPHAGOGASTRODUODENOSCOPY  08/09/98   Repeat dilatation Deatra Ina)  . ESOPHAGOGASTRODUODENOSCOPY  01/13/2001   stricture/ HH, duodenal ulcer / bleeding Henrene Pastor)  . ESOPHAGOGASTRODUODENOSCOPY  12/25/01   esophagus stricture, dilated  . ESOPHAGOGASTRODUODENOSCOPY  03/23/2001   stricture, dilated  . ESOPHAGOGASTRODUODENOSCOPY  03/08/05   Schatzki's Ring   03/13/05  Schatzki's Ring - dilated, duodenitis  . ESOPHAGOGASTRODUODENOSCOPY  06/11/08   with food disimpaction.  Food impaction Ms Kohler  Sm bulb ulcer (Dr. Watt Climes)  . ESOPHAGOGASTRODUODENOSCOPY     stricture/HH, duodenal ulcer/bleeding Henrene Pastor)  . ESOPHAGOGASTRODUODENOSCOPY N/A 01/23/2016   Procedure: ESOPHAGOGASTRODUODENOSCOPY (EGD);  Surgeon: [name], MD;  Location[facility] ENDOSCOPY;  Service: Endoscopy;  Laterality: N/A;  . ESOPHAGOGASTRODUODENOSCOPY (EGD) WITH PROPOFOL N/A 02/06/2016   Procedure: ESOPHAGOGASTRODUODENOSCOPY (EGD) WITH PROPOFOL;  Surgeon: [name], MD;  Location: [facility] ENDOSCOPY;  Service: Endoscopy;  Laterality: N/A;  . HERNIA REPAIR  2011   abdominal; post AVR  . MRI  2/05   LVH global hypokin EF 48% Mod A.I.  . VALVE REPLACEMENT  06/2009   St. Jude mechanical valve per Dr. Cyndia Bent   Family History  Problem Relation Age of Onset  . Obesity Mother        bedridden  . Hypertension Mother   . Heart disease Father        CABG, CAD  . Hypertension Father   . Lymphoma Father   . Cancer Father        lymphoma  . Alcohol abuse Brother   . Cancer Brother        melanoma  . Colon cancer Neg Hx   . Stomach cancer Neg Hx   . Rectal cancer Neg Hx   . Prostate cancer Neg Hx    Social History   Socioeconomic History  . Marital status: Married    Spouse name: Not on file  . Number of children: 2  . Years of  education: Not on file  . Highest education level: Not on file  Occupational History  . Occupation: mech. supervisor    Employer: [employer]    Comment: [place]  Social Needs  . Financial resource strain: Not on file  . Food insecurity:    Worry: Not on file    Inability: Not on file  . Transportation needs:    Medical: Not on file    Non-medical: Not on file  Tobacco Use  . Smoking status: Never Smoker  . Smokeless tobacco: Never Used  Substance and Sexual Activity  . Alcohol use: No    Alcohol/week: 0.0 standard drinks  . Drug use: No  . Sexual activity: Not on file  Lifestyle  . Physical activity:    Days per week: Not on file  Minutes per session: Not on file  . Stress: Not on file  Relationships  . Social connections:    Talks on phone: Not on file    Gets together: Not on file    Attends religious service: Not on file    Active member of club or organization: Not on file    Attends meetings of clubs or organizations: Not on file    Relationship status: Not on file  Other Topics Concern  . Not on file  Social History Narrative   Married 1996    Outpatient Encounter Medications as of 12/10/2017  Medication Sig  . ACCU-CHEK AVIVA PLUS test strip USE AS DIRECTED TO CHECK BLOOD SUGAR 2 TO 4 TIMES A DAY  . ACCU-CHEK FASTCLIX LANCETS MISC Inject 1 each into the skin 2 (two) times daily.  [name] albuterol (PROVENTIL) (2.5 MG/3ML) 0.083% nebulizer solution Take 3 mLs (2.5 mg total) by nebulization every 6 (six) hours as needed for wheezing or shortness of breath.  [name] amoxicillin (AMOXIL) 500 MG tablet Take 2,000 mg by mouth once. Before dental visits.  [name] azelastine (ASTELIN) 0.1 % nasal spray Place 2 sprays into both nostrils 2 (two) times daily as needed for rhinitis. Use in each nostril as directed  . benzonatate (TESSALON) 200 MG capsule TAKE 1 CAPSULE BY MOUTH 3 TIMES DAILY ASNEEDED FOR COUGH  . Blood Glucose Monitoring Suppl (ACCU-CHEK AVIVA PLUS) W/DEVICE KIT  1 kit by Does not apply route once.  . diphenhydrAMINE (BENADRYL) 25 MG tablet Take 25 mg by mouth every 6 (six) hours as needed for itching.   . docusate sodium (COLACE) 100 MG capsule Take 100 mg by mouth every other day.   . furosemide (LASIX) 20 MG tablet TAKE 1 TABLET BY MOUTH ONCE DAILY  . ipratropium-albuterol (DUONEB) 0.5-2.5 (3) MG/3ML SOLN Take 3 mLs by nebulization.  . Lancets Misc. (ACCU-CHEK FASTCLIX LANCET) KIT AS DIRECTED  . losartan (COZAAR) 50 MG tablet TAKE 1 AND 1/2 TABLETS BY MOUTH DAILY  . metFORMIN (GLUCOPHAGE) 500 MG tablet TAKE UP TO 2 TABLETS BY MOUTH TWICE DAILY  . potassium chloride (K-DUR) 10 MEQ tablet TAKE 1 TABLET BY MOUTH TWICE (2) DAILY  . pravastatin (PRAVACHOL) 20 MG tablet Take 1 tablet (20 mg total) daily by mouth.  . triamcinolone cream (KENALOG) 0.5 % APPLY TO SKIN TWICE DAILY AS NEEDED  . warfarin (COUMADIN) 1 MG tablet TAKE AS INSTRUCTED BY ANTICOAGULATION CLINIC.  [name] warfarin (COUMADIN) 3 MG tablet TAKE AS DIRECTED BY ANTICOAGULATION CLINIC  . carvedilol (COREG) 6.25 MG tablet Take 1 tablet (6.25 mg total) by mouth 2 (two) times daily.   No facility-administered encounter medications on file as of 12/10/2017.     Activities of Daily Living In your present state of health, do you have any difficulty performing the following activities: 12/10/2017  Hearing? Y  Vision? N  Difficulty concentrating or making decisions? N  Walking or climbing stairs? N  Dressing or bathing? N  Doing errands, shopping? N  Preparing Food and eating ? N  Using the Toilet? N  In the past six months, have you accidently leaked urine? N  Do you have problems with loss of bowel control? N  Managing your Medications? N  Managing your Finances? N  Housekeeping or managing your Housekeeping? N  Some recent data might be hidden    Patient Care Team: Tonia Ghent, MD as PCP - General (Family Medicine) Erroll Luna, MD as Consulting Physician (General Surgery)  Assessment:   This is a routine wellness examination for [place].  Hearing Screening Comments: Bilateral hearing aids Vision Screening Comments: Vision exam on 10/02/2017 with Dr. Maryruth Hancock B.   Exercise Activities and Dietary recommendations Current Exercise Habits: The patient does not participate in regular exercise at present, Exercise limited by: None identified  Goals    . Increase water intake     Starting 12/10/2017, I will attempt to drink at least 6-8 glasses of water daily.        Fall Risk Fall Risk  12/10/2017 12/02/2016 05/18/2015  Falls in the past year? 0 No No   Depression Screen PHQ 2/9 Scores 12/10/2017 12/02/2016 05/18/2015  PHQ - 2 Score 0 0 0  PHQ- 9 Score 0 0 -    Cognitive Function MMSE - Mini Mental State Exam 12/10/2017 12/02/2016  Orientation to time 5 5  Orientation to Place 5 5  Registration 3 3  Attention/ Calculation 0 0  Recall 3 3  Language- name 2 objects 0 0  Language- repeat 1 1  Language- follow 3 step command 3 3  Language- read & follow direction 0 0  Write a sentence 0 0  Copy design 0 0  Total score 20 20     PLEASE NOTE: A Mini-Cog screen was completed. Maximum score is 20. A value of 0 denotes this part of Folstein MMSE was not completed or the patient failed this part of the Mini-Cog screening.   Mini-Cog Screening Orientation to Time - Max 5 pts Orientation to Place - Max 5 pts Registration - Max 3 pts Recall - Max 3 pts Language Repeat - Max 1 pts Language Follow 3 Step Command - Max 3 pts     Immunization History  Administered Date(s) Administered  . Influenza Split 10/25/2010, 11/28/2011  . Influenza Whole 10/28/2009  . Influenza,inj,Quad PF,6+ Mos 11/23/2012, 11/01/2013, 11/16/2014, 12/01/2015, 12/03/2016, 12/10/2017  . Pneumococcal Conjugate-13 06/02/2017  . Pneumococcal Polysaccharide-23 01/28/2009  . Td 11/21/1997  . Tdap 04/01/2013  . Zoster 05/16/2014    Screening Tests Health Maintenance  Topic Date Due   . FOOT EXAM  06/03/2018  . PNA vac Low Risk Adult (2 of 2 - PPSV23) 06/03/2018  . HEMOGLOBIN A1C  06/10/2018  . OPHTHALMOLOGY EXAM  10/03/2018  . COLONOSCOPY  07/10/2021  . TETANUS/TDAP  04/02/2023  . INFLUENZA VACCINE  Completed  . Hepatitis C Screening  Completed  . HIV Screening  Completed       Plan:     I have personally reviewed, addressed, and noted the following in the patient's chart:  A. Medical and social history B. Use of alcohol, tobacco or illicit drugs  C. Current medications and supplements D. Functional ability and status E.  Nutritional status F.  Physical activity G. Advance directives H. List of other physicians I.  Hospitalizations, surgeries, and ER visits in previous 12 months J.  [place] to include hearing, vision, cognitive, depression L. Referrals and appointments - none  In addition, I have reviewed and discussed with patient certain preventive protocols, quality metrics, and best practice recommendations. A written personalized care plan for preventive services as well as general preventive health recommendations were provided to patient.  See attached scanned questionnaire for additional information.   Signed,   Lindell Noe, MHA, BS, LPN Health Coach

## 2017-12-11 ENCOUNTER — Encounter: Payer: Self-pay | Admitting: Family Medicine

## 2017-12-11 ENCOUNTER — Ambulatory Visit (INDEPENDENT_AMBULATORY_CARE_PROVIDER_SITE_OTHER): Payer: Medicare Other | Admitting: General Practice

## 2017-12-11 ENCOUNTER — Ambulatory Visit (INDEPENDENT_AMBULATORY_CARE_PROVIDER_SITE_OTHER): Payer: Medicare Other | Admitting: Family Medicine

## 2017-12-11 VITALS — BP 150/80 | HR 62 | Temp 97.8°F | Ht 71.0 in | Wt 244.5 lb

## 2017-12-11 DIAGNOSIS — E119 Type 2 diabetes mellitus without complications: Secondary | ICD-10-CM | POA: Diagnosis not present

## 2017-12-11 DIAGNOSIS — Z7901 Long term (current) use of anticoagulants: Secondary | ICD-10-CM

## 2017-12-11 DIAGNOSIS — Z7189 Other specified counseling: Secondary | ICD-10-CM

## 2017-12-11 DIAGNOSIS — E7800 Pure hypercholesterolemia, unspecified: Secondary | ICD-10-CM | POA: Diagnosis not present

## 2017-12-11 DIAGNOSIS — I4891 Unspecified atrial fibrillation: Secondary | ICD-10-CM

## 2017-12-11 DIAGNOSIS — Z0000 Encounter for general adult medical examination without abnormal findings: Secondary | ICD-10-CM

## 2017-12-11 DIAGNOSIS — Z952 Presence of prosthetic heart valve: Secondary | ICD-10-CM

## 2017-12-11 LAB — POCT INR: INR: 2.3 (ref 2.0–3.0)

## 2017-12-11 MED ORDER — PRAVASTATIN SODIUM 20 MG PO TABS: 20.0000 mg | ORAL_TABLET | Freq: Every day | ORAL | 3 refills | Status: DC

## 2017-12-11 MED ORDER — GLIPIZIDE 5 MG PO TABS: 5.0000 mg | ORAL_TABLET | Freq: Every day | ORAL | 3 refills | Status: DC

## 2017-12-11 NOTE — Patient Instructions (Addendum)
Check your med list at home, especially about pravastatin.    If your sugar is >200 in the AM, then take glipizide.  Skip it otherwise.  Keep working on diet and exercise.   Recheck in about 3 months.  The only lab you need to have done for your next diabetic visit is an A1c.  We can do this with a fingerstick test at the office visit.  You do not need a lab visit ahead of time for this.  It does not matter if you are fasting when the lab is done.   Try to line that up with an INR visit.    Check your BP at home and update me if consistently >140/>90.   Take care.  Glad to see you.

## 2017-12-11 NOTE — Patient Instructions (Incomplete)
Pre visit review using our clinic review tool, if applicable. No additional management support is needed unless otherwise documented below in the visit note.  Continue to take 4 mg daily except 2 mg on Monday and Fridays.  Re-check 4 weeks.

## 2017-12-11 NOTE — Progress Notes (Signed)
Diabetes:  Using medications without difficulties: yes, he has been glipizide 5mg  a day when AM sugar >200.  He has lows if AM sugar is <150 and he takes glipizide.  Still on metformin 1000mg  BID.   Hypoglycemic episodes: see above Hyperglycemic episodes:no Feet problems:no Blood Sugars averaging: usually 150-200.   eye exam within last year: yes A1c higher, d/w pt.   Labs d/w pt.    Elevated Cholesterol: Using medications without problems: yes Muscle aches: no Diet compliance: encouraged.   Exercise: encouraged, he can do more, d/w pt.  "I need to put my mind to it."   Labs d/w pt.    H/o valve replacement and still on coumadin.  No bleeding.  Some bruising at baseline, occ.  No blood in urine or stool.  No CP, SOB, BLE edema.  2mg  2 days a week and 4mg  5 days a week.   Tetanus 2015 Shingles 2016 PNA up to date for now Flu shot 2019 Colonoscopy 2013  Prostate cancer screening and PSA options (with potential risks and benefits of testing vs not testing) were discussed along with recent recs/guidelines.  He declined testing PSA at this point. Advance directive- wife designated if patient were incapacitated.  He has noted occ memory lapses but no red flag events.    Recheck BP 150/80.    PMH and SH reviewed  Meds, vitals, and allergies reviewed.   ROS: Per HPI unless specifically indicated in ROS section   GEN: nad, alert and oriented HEENT: mucous membranes moist NECK: supple w/o LA CV: rrr. PULM: ctab, no inc wob ABD: soft, +bs EXT: no edema SKIN: no acute rash  Diabetic foot exam: Normal inspection No skin breakdown No calluses  Normal DP pulses Normal sensation to light touch and monofilament Nails normal

## 2017-12-14 NOTE — Assessment & Plan Note (Signed)
Advance directive- wife designated if patient were incapacitated.  

## 2017-12-14 NOTE — Progress Notes (Signed)
Agree. Thanks

## 2017-12-14 NOTE — Assessment & Plan Note (Signed)
Continue work on diet and exercise.  He will check his med list at home about pravastatin.  Labs discussed with patient.  He agrees.

## 2017-12-14 NOTE — Assessment & Plan Note (Signed)
A1c higher, d/w pt.   Labs d/w pt.   We talked about options.  He is going to work harder on diet and exercise.  No change in metformin.  If his sugar is above 200 in the morning that he will take glipizide that day.  Goal to avoid hypoglycemia.  Recheck periodically.  Update me as needed in the meantime.  He agrees.

## 2017-12-14 NOTE — Assessment & Plan Note (Signed)
Tolerating Coumadin.  Compliant with current medication.  No chest pain.  No shortness of breath.  Still okay for outpatient follow-up.  I will recheck his blood pressure at home and if persistently elevated then he will update me.  See after visit summary.  He agrees. >25 minutes spent in face to face time with patient, >50% spent in counselling or coordination of care.

## 2017-12-14 NOTE — Assessment & Plan Note (Signed)
Tetanus 2015 Shingles 2016 PNA up to date for now Flu shot 2019 Colonoscopy 2013  Prostate cancer screening and PSA options (with potential risks and benefits of testing vs not testing) were discussed along with recent recs/guidelines.  He declined testing PSA at this point. Advance directive- wife designated if patient were incapacitated.  He has noted occ memory lapses but no red flag events.    We agreed to follow clinically.  He agreed.

## 2017-12-18 ENCOUNTER — Ambulatory Visit: Payer: Medicare Other

## 2017-12-22 ENCOUNTER — Telehealth: Payer: Self-pay | Admitting: *Deleted

## 2017-12-22 NOTE — Telephone Encounter (Signed)
-----   Message from Tonia Ghent, MD sent at 12/22/2017  6:20 AM EST ----- Regarding: FW: Colonoscopy follow-up date? See below. Notify pt.  Would still need 10 year f/u.  Thanks.  Brigitte Pulse  ----- Message ----- From: Jerene Bears, MD Sent: 12/15/2017   9:25 AM EST To: Tonia Ghent, MD Subject: RE: Colonoscopy follow-up date?                Brigitte Pulse,  If only 1 2nd degree relative with colon cancer, then he would remain on 10 yr surveillance schedule. However, if 2 2nd degree relatives have colon cancer, then interval would move to 5 yrs. Thanks JMP  ----- Message ----- From: Tonia Ghent, MD Sent: 12/14/2017  10:53 PM EST To: Jerene Bears, MD Subject: Colonoscopy follow-up date?                    Patient had colonoscopy without polyps in 2013.  I just found out that his aunt had colon cancer.  I thought that would still leave him at a 10-year follow-up.  Is that the case?  Thanks.  Brigitte Pulse

## 2017-12-22 NOTE — Telephone Encounter (Signed)
Patient advised.

## 2017-12-31 ENCOUNTER — Other Ambulatory Visit: Payer: Self-pay | Admitting: Family Medicine

## 2018-01-01 NOTE — Telephone Encounter (Signed)
Patient is compliant with coumadin management will refill x 6 months.  

## 2018-01-02 ENCOUNTER — Other Ambulatory Visit: Payer: Self-pay | Admitting: *Deleted

## 2018-01-02 MED ORDER — CARVEDILOL 6.25 MG PO TABS: 6.2500 mg | ORAL_TABLET | Freq: Two times a day (BID) | ORAL | 4 refills | Status: DC

## 2018-01-02 NOTE — Telephone Encounter (Signed)
REFILLED MEDICATION X 4 , NEEDS ANNUAL APPOINTMENT

## 2018-01-08 ENCOUNTER — Ambulatory Visit: Payer: Medicare Other | Admitting: General Practice

## 2018-01-08 DIAGNOSIS — Z952 Presence of prosthetic heart valve: Secondary | ICD-10-CM

## 2018-01-08 DIAGNOSIS — Z7901 Long term (current) use of anticoagulants: Secondary | ICD-10-CM

## 2018-01-08 DIAGNOSIS — I4891 Unspecified atrial fibrillation: Secondary | ICD-10-CM

## 2018-01-08 LAB — POCT INR: INR: 2.2 (ref 2.0–3.0)

## 2018-01-08 NOTE — Patient Instructions (Addendum)
Pre visit review using our clinic review tool, if applicable. No additional management support is needed unless otherwise documented below in the visit note.  Continue to take 4 mg daily except 2 mg on Monday and Fridays.  Re-check 4 weeks.

## 2018-01-11 NOTE — Progress Notes (Signed)
Agree. Thanks

## 2018-02-05 ENCOUNTER — Ambulatory Visit: Payer: Medicare Other | Admitting: General Practice

## 2018-02-05 DIAGNOSIS — Z7901 Long term (current) use of anticoagulants: Secondary | ICD-10-CM | POA: Diagnosis not present

## 2018-02-05 DIAGNOSIS — Z952 Presence of prosthetic heart valve: Secondary | ICD-10-CM

## 2018-02-05 DIAGNOSIS — I4891 Unspecified atrial fibrillation: Secondary | ICD-10-CM

## 2018-02-05 LAB — POCT INR: INR: 2.3 (ref 2.0–3.0)

## 2018-02-05 NOTE — Patient Instructions (Addendum)
Pre visit review using our clinic review tool, if applicable. No additional management support is needed unless otherwise documented below in the visit note.  Continue to take 4 mg daily except 2 mg on Monday and Fridays.  Re-check 6  Friday 2/14 while here to see Dr. Damita Dunnings.

## 2018-02-05 NOTE — Progress Notes (Signed)
Agree. Thanks

## 2018-02-10 NOTE — Progress Notes (Signed)
HPI The patient presents for followup after aortic valve replacement. Since I last saw him he has done well.  We tried to start aspirin at the last visit but he is had problems with this from a GI standpoint and he had some dark stools.  He stopped taking it.  He otherwise has been doing very well.  He stays active in retirement around his house. The patient denies any new symptoms such as chest discomfort, neck or arm discomfort. There has been no new shortness of breath, PND or orthopnea. There have been no reported palpitations, presyncope or syncope.   Allergies  Allergen Reactions  . Ace Inhibitors     REACTION: cough  . Aspirin Other (See Comments)    Gastritis.   . Primaxin [Imipenem W/Cilastatin Sodium]     Rash- but able to take amoxil w/o troubles.     Current Outpatient Medications  Medication Sig Dispense Refill  . ACCU-CHEK AVIVA PLUS test strip USE AS DIRECTED TO CHECK BLOOD SUGAR 2 TO 4 TIMES A DAY 100 each 11  . ACCU-CHEK FASTCLIX LANCETS MISC Inject 1 each into the skin 2 (two) times daily. 102 each 4  . albuterol (PROVENTIL) (2.5 MG/3ML) 0.083% nebulizer solution Take 3 mLs (2.5 mg total) by nebulization every 6 (six) hours as needed for wheezing or shortness of breath. 150 mL 1  . azelastine (ASTELIN) 0.1 % nasal spray Place 2 sprays into both nostrils 2 (two) times daily as needed for rhinitis. Use in each nostril as directed    . benzonatate (TESSALON) 200 MG capsule TAKE 1 CAPSULE BY MOUTH 3 TIMES DAILY ASNEEDED FOR COUGH 30 capsule 1  . Blood Glucose Monitoring Suppl (ACCU-CHEK AVIVA PLUS) W/DEVICE KIT 1 kit by Does not apply route once. 1 kit 0  . carvedilol (COREG) 6.25 MG tablet Take 1 tablet (6.25 mg total) by mouth 2 (two) times daily. NEEDS APPOINTMENT BEFORE NEXT REFILLS 60 tablet 4  . diphenhydrAMINE (BENADRYL) 25 MG tablet Take 25 mg by mouth every 6 (six) hours as needed for itching.     . docusate sodium (COLACE) 100 MG capsule Take 100 mg by mouth  every other day.     . furosemide (LASIX) 20 MG tablet TAKE 1 TABLET BY MOUTH ONCE A DAY 90 tablet 2  . glipiZIDE (GLUCOTROL) 5 MG tablet Take 1 tablet (5 mg total) by mouth daily before breakfast. If AM sugar is above 200 90 tablet 3  . Lancets Misc. (ACCU-CHEK FASTCLIX LANCET) KIT AS DIRECTED 1 kit 0  . losartan (COZAAR) 50 MG tablet TAKE 1 AND 1/2 TABLETS BY MOUTH DAILY 135 tablet 2  . metFORMIN (GLUCOPHAGE) 500 MG tablet TAKE UP TO 2 TABLETS BY MOUTH TWICE DAILY 360 tablet 3  . potassium chloride (K-DUR) 10 MEQ tablet TAKE 1 TABLET BY MOUTH TWICE (2) DAILY 180 tablet 3  . pravastatin (PRAVACHOL) 20 MG tablet Take 1 tablet (20 mg total) by mouth daily. 90 tablet 3  . triamcinolone cream (KENALOG) 0.5 % APPLY TO SKIN TWICE DAILY AS NEEDED 30 g 1  . warfarin (COUMADIN) 1 MG tablet TAKE AS INSTRUCTED BY ANTOCOAGULATION CLINIC 120 tablet 1  . warfarin (COUMADIN) 3 MG tablet TAKE AS DIRECTED BY ANTICOAGULATION CLINIC 70 tablet 1  . amoxicillin (AMOXIL) 500 MG tablet Take 2,000 mg by mouth once. Before dental visits.     No current facility-administered medications for this visit.     Past Medical History:  Diagnosis Date  .  Anemia   . Aortic insufficiency    with bicuspid aortic valve (the last MRI demonstrated the aortic root to be 4.8 x 4.7 cm.) Mod regurgitation. Normal chamber size with very mild left ventricular dysfunction.)  . Blood transfusion   . CHF (congestive heart failure) ([place])   . Cough 06/28/2009   Dr. Melvyn Novas  . Diabetes mellitus without complication ([place])   . Duodenal ulcer    with GI bleeding  . Dyslipidemia   . GERD (gastroesophageal reflux disease) 03/23/2001   egd w/ dilation...Marland KitchenMarland KitchenDeatra Ina  . Hemorrhoids   . Hypertension   . 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 Orthostasis 08/2009   Fever and syncope, no source for fever on cultures.  . Pericardial effusion 07/2009   admission to [facility], s/p pericardial window  .  Pleural effusion 10/2009   s/p thoracentesis  . PNA (pneumonia) 10/2009   admnission to [place], with right pleural effusion, s/p thoracentesis  . Syncope 08/2009   admission for fever and syncope, no source for fever seen on cultures. Syncope thought to be r/t orthostasis    Past Surgical History:  Procedure Laterality Date  . 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 Cardiolite EKG  03/10/2003   (-) ? small/apical infarct EF 50%  . DOPPLER ECHOCARDIOGRAPHY  01/18/97   LVH, mild dilated aorta - root mod to severe aortic regurg EF 50-55%  . DOPPLER ECHOCARDIOGRAPHY  09/27/03   No changes, moderate severe aortic regurg  . DOPPLER ECHOCARDIOGRAPHY  07/09/2004   Mod severe aortic regurg 2-3+ T.R., T.I.R., mild P..R.  . DOPPLER ECHOCARDIOGRAPHY  04/01/06   Hypokin Apex EF 55%  . DOPPLER ECHOCARDIOGRAPHY  02/15/2010   Mild LVH, mild decr Sys fctn EF 40-45% Mild Dias Dysfctn Mech AV Triv AR, MR  . ESOPHAGOGASTRODUODENOSCOPY  1990, 1999   Hot dog in throat (1987)/ Chicken in throat (1990)/ 01/06/97 (Dr. Earlean Shawl) Impaction, dilatation, Schatzki's Ring/ 07/1998 repeat Dilatation/ 01/10/01 stricture/dilated/ 03/08/05 Schatzki's Ring, dilated/ 06/01/08 Food Disimpaction [name] Bulb Ulcer (Dr. Watt Climes)  . ESOPHAGOGASTRODUODENOSCOPY  01/06/1997   (Dr. Earlean Shawl) Impaction, dilatation, Schatzki's Ring  . ESOPHAGOGASTRODUODENOSCOPY  08/09/98   Repeat dilatation Deatra Ina)  . ESOPHAGOGASTRODUODENOSCOPY  01/13/2001   stricture/ HH, duodenal ulcer / bleeding Henrene Pastor)  . ESOPHAGOGASTRODUODENOSCOPY  12/25/01   esophagus stricture, dilated  . ESOPHAGOGASTRODUODENOSCOPY  03/23/2001   stricture, dilated  . ESOPHAGOGASTRODUODENOSCOPY  03/08/05   Schatzki's Ring   03/13/05  Schatzki's Ring - dilated, duodenitis  . ESOPHAGOGASTRODUODENOSCOPY  06/11/08   with food disimpaction.  Food impaction Ms [place]  Sm bulb ulcer (Dr. Watt Climes)  . ESOPHAGOGASTRODUODENOSCOPY     stricture/HH, duodenal ulcer/bleeding Henrene Pastor)  .  ESOPHAGOGASTRODUODENOSCOPY N/A 01/23/2016   Procedure: ESOPHAGOGASTRODUODENOSCOPY (EGD);  Surgeon: [name], MD;  Location: [facility] ENDOSCOPY;  Service: Endoscopy;  Laterality: N/A;  . ESOPHAGOGASTRODUODENOSCOPY (EGD) WITH PROPOFOL N/A 02/06/2016   Procedure: ESOPHAGOGASTRODUODENOSCOPY (EGD) WITH PROPOFOL;  Surgeon: [name], MD;  Location: [facility] ENDOSCOPY;  Service: Endoscopy;  Laterality: N/A;  . HERNIA REPAIR  2011   abdominal; post AVR  . MRI  2/05   LVH global hypokin EF 48% Mod A.I.  . VALVE REPLACEMENT  06/2009   St. Jude mechanical valve per Dr. Cyndia Bent    ROS:  As stated in the HPI and negative for all other systems.  PHYSICAL EXAM BP (!) 142/78   Pulse (!) 58   Ht '5\' 11"'$  (1.803 m)   Wt 242 lb 9.6 oz (  110 kg)   SpO2 95%   BMI 33.84 kg/m   GENERAL:  Well appearing NECK:  No jugular venous distention, waveform within normal limits, carotid upstroke brisk and symmetric, no bruits, no thyromegaly LUNGS:  Clear to auscultation bilaterally CHEST:  Well healed sternotomy scar. HEART:  PMI not displaced or sustained,S1 and mechanical S2 within normal limits, no S3, no S4, no clicks, no rubs, no murmurs ABD:  Flat, positive bowel sounds normal in frequency in pitch, no bruits, no rebound, no guarding, no midline pulsatile mass, no hepatomegaly, no splenomegaly EXT:  2 plus pulses throughout, trace right greater than left leg edema, no cyanosis no clubbing, chronic venous stasis changes   EKG:  Sinus rhythm, rate 58, left axis deviation,LBBB, premature atrial contractions 01/23/16   ASSESSMENT AND PLAN  AVR: He understands endocarditis prophylaxis.  He is compliant with anticoagulation.  He had normal valve function on echo in 2018.  No further imaging is indicated.   CARDIOMYOPATHY: He has a mildly reduced ejection fraction of 45% that is unchanged over serial EKGs.  He is euvolemic.  No change in therapy.   OBESITY: He understands the need for weight loss with diet and exercise  and we discussed this previously.   ABNORMAL EKG: This is unchanged.  No change in therapy.   HTN: The blood pressure is slightly borderline.  I would suggest weight loss with diet and exercise and continue otherwise the meds as listed.  DM: His A1c is 8.4.  He is having this actively managed.  He understands this is not at target.  I will defer to Tonia Ghent, MD

## 2018-02-11 ENCOUNTER — Ambulatory Visit: Payer: Medicare Other | Admitting: Cardiology

## 2018-02-11 ENCOUNTER — Encounter: Payer: Self-pay | Admitting: Cardiology

## 2018-02-11 VITALS — BP 142/78 | HR 58 | Ht 71.0 in | Wt 242.6 lb

## 2018-02-11 DIAGNOSIS — I420 Dilated cardiomyopathy: Secondary | ICD-10-CM

## 2018-02-11 DIAGNOSIS — Z952 Presence of prosthetic heart valve: Secondary | ICD-10-CM

## 2018-02-11 DIAGNOSIS — I10 Essential (primary) hypertension: Secondary | ICD-10-CM

## 2018-02-11 NOTE — Patient Instructions (Signed)
Medication Instructions:  Continue current medications  If you need a refill on your cardiac medications before your next appointment, please call your pharmacy.  Labwork: None Ordered   Take the provided lab slips with you to the lab for your blood draw.  When you have your labs (blood work) drawn today and your tests are completely normal, you will receive your results only by MyChart Message (if you have MyChart) -OR-  A paper copy in the mail.  If you have any lab test that is abnormal or we need to change your treatment, we will call you to review these results.  Testing/Procedures: None ordered  Follow-Up: You will need a follow up appointment in 18 months.  Please call our office 2 months in advance to schedule this appointment.  You may see Dr Percival Spanish or one of the following Advanced Practice Providers on your designated Care Team:   Rosaria Ferries, PA-C . Jory Sims, DNP, ANP   At [facility], you and your health needs are our priority.  As part of our continuing mission to provide you with exceptional heart care, we have created designated Provider Care Teams.  These Care Teams include your primary Cardiologist (physician) and Advanced Practice Providers (APPs -  Physician Assistants and Nurse Practitioners) who all work together to provide you with the care you need, when you need it.  Thank you for choosing [facility] at [facility]!!

## 2018-02-24 ENCOUNTER — Other Ambulatory Visit: Payer: Self-pay | Admitting: Family Medicine

## 2018-02-27 ENCOUNTER — Other Ambulatory Visit: Payer: Self-pay | Admitting: Family Medicine

## 2018-02-27 NOTE — Telephone Encounter (Signed)
Patient is compliant with his coumadin management, will refill X 7mths.

## 2018-03-13 ENCOUNTER — Ambulatory Visit: Payer: Medicare Other | Admitting: Family Medicine

## 2018-03-13 ENCOUNTER — Encounter: Payer: Self-pay | Admitting: Family Medicine

## 2018-03-13 VITALS — BP 182/80 | HR 69 | Temp 97.7°F | Ht 71.0 in | Wt 240.4 lb

## 2018-03-13 DIAGNOSIS — I4891 Unspecified atrial fibrillation: Secondary | ICD-10-CM

## 2018-03-13 DIAGNOSIS — Z7901 Long term (current) use of anticoagulants: Secondary | ICD-10-CM

## 2018-03-13 DIAGNOSIS — I10 Essential (primary) hypertension: Secondary | ICD-10-CM

## 2018-03-13 DIAGNOSIS — E119 Type 2 diabetes mellitus without complications: Secondary | ICD-10-CM

## 2018-03-13 DIAGNOSIS — Z5181 Encounter for therapeutic drug level monitoring: Secondary | ICD-10-CM

## 2018-03-13 LAB — POCT INR: INR: 2.4 (ref 2.0–3.0)

## 2018-03-13 LAB — POCT GLYCOSYLATED HEMOGLOBIN (HGB A1C): Hemoglobin A1C: 8.4 % — AB (ref 4.0–5.6)

## 2018-03-13 MED ORDER — LOSARTAN POTASSIUM 50 MG PO TABS: 50.0000 mg | ORAL_TABLET | Freq: Two times a day (BID) | ORAL | Status: DC

## 2018-03-13 NOTE — Progress Notes (Signed)
Diabetes:  Using medications without difficulties: yes Hypoglycemic episodes: rare episodes, down to 81 recently.   Hyperglycemic episodes:no Feet problems:no Blood Sugars averaging: occ 200s, now ~150 since back at the gym.   eye exam within last year:yes A1c 8.4, d/w pt.    He started back at the gym recently.  His brother in law is going with him and he feels good about it.    BP has been elevated on home checks.  No CP.  No BLE, no SOB.    No bleeding.  Due for INR, done at [facility], d/w pt about results.  Compliant with coumadin.  Taking 2mg  M and F and 4mg  on the other 5 days a week.    Meds, vitals, and allergies reviewed.   ROS: Per HPI unless specifically indicated in ROS section   GEN: nad, alert and oriented HEENT: mucous membranes moist NECK: supple w/o LA CV: sounds rrr. PULM: ctab, no inc wob ABD: soft, +bs EXT: no edema SKIN: no acute rash

## 2018-03-13 NOTE — Patient Instructions (Addendum)
Increase the losartan to 2 tabs (100mg ) a day.   Update me about your BP in about 2 weeks.  Plan on recheck in about 3 months with me.   INR in about 4 weeks.  Don't change your coumadin for now.  2mg  Monday and Friday and 4mg  on the other 5 days a week.   Update [place] as needed otherwise.  No change in sugar meds given the changes in your readings now that you are going to the gym.   Take care.  Glad to see you.

## 2018-03-15 NOTE — Assessment & Plan Note (Signed)
Recheck INR in about 4 weeks.  No change in coumadin for now.  2mg  Monday and Friday and 4mg  on the other 5 days a week.   He agrees.

## 2018-03-15 NOTE — Assessment & Plan Note (Signed)
Increase losartan to 2 tabs (100mg ) a day.   He will update me about BP in about 2 weeks.  Plan on recheck in about 3 months with me.   He agrees.

## 2018-03-15 NOTE — Assessment & Plan Note (Signed)
Blood Sugars averaging: occ 200s, now ~150 since back at the gym.   eye exam within last year:yes A1c 8.4, d/w pt.   Recheck periodically.  See after visit summary.  No change in meds at this point since his blood sugars are clearly improved with recent return to scheduled exercise.  He agrees.

## 2018-04-09 ENCOUNTER — Other Ambulatory Visit: Payer: Self-pay

## 2018-04-09 ENCOUNTER — Ambulatory Visit (INDEPENDENT_AMBULATORY_CARE_PROVIDER_SITE_OTHER): Payer: Medicare Other | Admitting: General Practice

## 2018-04-09 DIAGNOSIS — Z952 Presence of prosthetic heart valve: Secondary | ICD-10-CM

## 2018-04-09 DIAGNOSIS — Z7901 Long term (current) use of anticoagulants: Secondary | ICD-10-CM

## 2018-04-09 DIAGNOSIS — I4891 Unspecified atrial fibrillation: Secondary | ICD-10-CM

## 2018-04-09 LAB — POCT INR: INR: 2.2 (ref 2.0–3.0)

## 2018-04-09 NOTE — Patient Instructions (Incomplete)
.  Pre visit review using our clinic review tool, if applicable. No additional management support is needed unless otherwise documented below in the visit note.  Continue to take 4 mg daily except 2 mg on Monday and Fridays.  Re-check 6 weeks.

## 2018-04-12 NOTE — Progress Notes (Signed)
Agree. Thanks

## 2018-04-15 ENCOUNTER — Telehealth: Payer: Self-pay | Admitting: Family Medicine

## 2018-04-15 NOTE — Telephone Encounter (Signed)
Pt was sched for oral surgery today but dental office called to cx due to [place]. Pt want an antibiotic so he wont get an abscess. Please advise   Send to [place]

## 2018-04-15 NOTE — Telephone Encounter (Signed)
Unable to reach pt or pts wife on any contact # left v/m requesting pt to cb.

## 2018-04-15 NOTE — Telephone Encounter (Signed)
I don't know anything about this, prior to this message.  Have him call the dental clinic asap and ask for their input on abx.  Have him ask the on call dentist at the clinic if the office is closed.   What procedure is/was he going to have done?   Did they want him to hold his coumadin? Is he doing to have the procedure rescheduled?   I also need input from anticoagulation clinic. I routed this in the meantime.   Thanks.

## 2018-04-15 NOTE — Telephone Encounter (Signed)
Pt's wife calling concerning status of previous message

## 2018-04-16 ENCOUNTER — Telehealth: Payer: Self-pay | Admitting: General Practice

## 2018-04-16 MED ORDER — AMOXICILLIN 875 MG PO TABS: 875.0000 mg | ORAL_TABLET | Freq: Two times a day (BID) | ORAL | 0 refills | Status: DC

## 2018-04-16 NOTE — Telephone Encounter (Signed)
Attempted to reach patient.  LMOVM to call Villa Herb, RN @ [phone].

## 2018-04-16 NOTE — Telephone Encounter (Signed)
Mrs Asper [facility] signed) called back; pt to have a procedure by oral surgeon but oral surgeon office closed due to covid and is not sure when will reopen; not sure if molar is abscessing. Pt to have root canal or tooth removal. The pt has not seen dentist in one yr and has never seen oral surgeon. Pt was advised 1 yr ago to go to oral surgeon but at that time tooth was not bothering pt;now pt has pain level of 8 and pt is taking ibuprofen 800 mg q8h. Pt's wife said dentist office advised her to call PCP for abx. [place]. Pt is not holding coumadin because not sure when will get procedure scheduled. Mrs Badeaux request cb after Dr Damita Dunnings reviews.

## 2018-04-16 NOTE — Telephone Encounter (Signed)
As I am out of the office today, I will forward this to Villa Herb in the coumadin clinic to see if she has any details on this procedure.  I was not aware of any upcoming procedure details for patient.

## 2018-04-16 NOTE — Telephone Encounter (Signed)
He has been able to tolerate amoxil in the past.  I sent rx and we shouldn't have to change his warfarin for that rx However, I would limit ibuprofen and he may need to change his coumadin dosing around the time of the procedure- he'll need to get dental input about that.   I think it makes sense to f/u with the dental clinic when possible.  Thanks.

## 2018-04-16 NOTE — Telephone Encounter (Signed)
I spoke with patient's wife and let her know that amoxicillin as been called to the pharmacy.  I also advised her to have patient decrease the use of ibuprofen to 400 mg maybe twice a day if needed rather than taking 800 mg three times a day.  Patient is coming in Monday at 108 for a coumadin check.  I advised to have patient watch and report any unusual bleeding.  Mrs. Canavan verbalized understanding.

## 2018-04-16 NOTE — Telephone Encounter (Signed)
Patient has been notified and will be coming in Monday morning for an INR check.

## 2018-04-16 NOTE — Telephone Encounter (Signed)
Noted. Thanks.

## 2018-04-20 ENCOUNTER — Ambulatory Visit: Payer: Medicare Other

## 2018-04-20 ENCOUNTER — Other Ambulatory Visit: Payer: Self-pay | Admitting: Family Medicine

## 2018-04-20 ENCOUNTER — Other Ambulatory Visit: Payer: Self-pay

## 2018-04-20 DIAGNOSIS — Z7901 Long term (current) use of anticoagulants: Secondary | ICD-10-CM | POA: Diagnosis not present

## 2018-04-20 DIAGNOSIS — I4891 Unspecified atrial fibrillation: Secondary | ICD-10-CM

## 2018-04-20 DIAGNOSIS — Z952 Presence of prosthetic heart valve: Secondary | ICD-10-CM

## 2018-04-20 LAB — POCT INR: INR: 2.6 (ref 2.0–3.0)

## 2018-04-20 NOTE — Patient Instructions (Signed)
INR:  2.6  Continue to take 4 mg daily except 2 mg on Monday and Fridays.  Recheck in 2 weeks.

## 2018-04-22 NOTE — Progress Notes (Signed)
Agree. Thanks

## 2018-04-30 ENCOUNTER — Encounter: Payer: Self-pay | Admitting: Family Medicine

## 2018-04-30 ENCOUNTER — Other Ambulatory Visit (INDEPENDENT_AMBULATORY_CARE_PROVIDER_SITE_OTHER): Payer: Medicare Other

## 2018-04-30 ENCOUNTER — Ambulatory Visit (INDEPENDENT_AMBULATORY_CARE_PROVIDER_SITE_OTHER)
Admission: RE | Admit: 2018-04-30 | Discharge: 2018-04-30 | Disposition: A | Discharge status: Home / Self Care | Payer: Medicare Other | Source: Ambulatory Visit | Attending: Family Medicine | Admitting: Family Medicine

## 2018-04-30 ENCOUNTER — Other Ambulatory Visit: Payer: Self-pay

## 2018-04-30 ENCOUNTER — Ambulatory Visit: Payer: Medicare Other | Admitting: Family Medicine

## 2018-04-30 ENCOUNTER — Ambulatory Visit (INDEPENDENT_AMBULATORY_CARE_PROVIDER_SITE_OTHER): Payer: Medicare Other | Admitting: General Practice

## 2018-04-30 VITALS — BP 140/62 | HR 64 | Temp 97.8°F | Resp 16 | Ht 71.0 in | Wt 242.5 lb

## 2018-04-30 DIAGNOSIS — Z5181 Encounter for therapeutic drug level monitoring: Secondary | ICD-10-CM | POA: Diagnosis not present

## 2018-04-30 DIAGNOSIS — Z7901 Long term (current) use of anticoagulants: Secondary | ICD-10-CM | POA: Diagnosis not present

## 2018-04-30 DIAGNOSIS — M79604 Pain in right leg: Secondary | ICD-10-CM

## 2018-04-30 DIAGNOSIS — I4891 Unspecified atrial fibrillation: Secondary | ICD-10-CM | POA: Diagnosis not present

## 2018-04-30 DIAGNOSIS — S8011XA Contusion of right lower leg, initial encounter: Secondary | ICD-10-CM

## 2018-04-30 DIAGNOSIS — Z952 Presence of prosthetic heart valve: Secondary | ICD-10-CM

## 2018-04-30 LAB — POCT INR: INR: 2.5 (ref 2.0–3.0)

## 2018-04-30 NOTE — Patient Instructions (Addendum)
Will follow-up final X-ray read. At this point does not look like a fracture.   1) Continue to ice for the next 1-2 days 2) Continue to elevate as much as possible 3) When pain is improving work on flexibility of the knee - straightening and bending 4) Ok to take tylenol as needed 5) Return to see Tonia Ghent, MD if no improvement in 2-3 weeks

## 2018-04-30 NOTE — Progress Notes (Addendum)
Subjective:     [name] [name] is a 68 y.o. male presenting for Knee Pain (RIght foot and right leg went through the floor (floor board broke) on 04/28/2018. Right knee swelling present but some better today pain present, some abrasion present.)     Knee Pain   The incident occurred 2 days ago. The incident occurred at home. The injury mechanism was a fall. The pain is present in the right knee and right leg. The pain is mild. The pain has been improving since onset. Associated symptoms include a loss of motion. Pertinent negatives include no inability to bear weight, numbness or tingling. He reports no foreign bodies present. The symptoms are aggravated by movement. He has tried ice, rest, elevation and acetaminophen for the symptoms. The treatment provided mild relief.     Review of Systems  Neurological: Negative for tingling and numbness.     Social History   Tobacco Use  Smoking Status Never Smoker  Smokeless Tobacco Never Used        Objective:    BP Readings from Last 3 Encounters:  04/30/18 140/62  03/13/18 (!) 182/80  02/11/18 (!) 142/78   Wt Readings from Last 3 Encounters:  04/30/18 242 lb 8 oz (110 kg)  03/13/18 240 lb 6 oz (109 kg)  02/11/18 242 lb 9.6 oz (110 kg)    BP 140/62   Pulse 64   Temp 97.8 F (36.6 C)   Resp 16   Ht 5\' 11"  (1.803 m)   Wt 242 lb 8 oz (110 kg)   BMI 33.82 kg/m    Physical Exam Constitutional:      Appearance: Normal appearance. He is not ill-appearing or diaphoretic.  HENT:     Right Ear: External ear normal.     Left Ear: External ear normal.     Nose: Nose normal.  Eyes:     General: No scleral icterus.    Extraocular Movements: Extraocular movements intact.     Conjunctiva/sclera: Conjunctivae normal.  Neck:     Musculoskeletal: Neck supple.  Cardiovascular:     Rate and Rhythm: Normal rate.  Pulmonary:     Effort: Pulmonary effort is normal.  Musculoskeletal:     Comments: Right leg:  Inspection:  small abrasion on anterior leg, some venous stasis changes Palpation: : 1+ edema through knee, TTP along the proximal Tibula and fibula, knee edema ROM: normal knee extension, decreased flexion   Skin:    General: Skin is warm and dry.  Neurological:     Mental Status: He is alert. Mental status is at baseline.  Psychiatric:        Mood and Affect: Mood normal.      Right tib/fib XR: on my review, no fracture     Assessment & Plan:   Problem List Items Addressed This Visit    None    Visit Diagnoses    Right leg pain    -  Primary   Relevant Orders   DG Tibia/Fibula Right   Contusion of right lower extremity, initial encounter         Suspect contusion injury, but will follow-up final read of XR  Addendum: Final read with no fracture  Patient Instructions  Will follow-up final X-ray read. At this point does not look like a fracture.   1) Continue to ice for the next 1-2 days 2) Continue to elevate as much as possible 3) When pain is improving work on flexibility of the knee -  straightening and bending 4) Ok to take tylenol as needed 5) Return to see Tonia Ghent, MD if no improvement in 2-3 weeks      Return in about 2 weeks (around 05/14/2018), or if symptoms worsen or fail to improve.  Lesleigh Noe, MD

## 2018-04-30 NOTE — Patient Instructions (Signed)
Pre visit review using our clinic review tool, if applicable. No additional management support is needed unless otherwise documented below in the visit note.  Continue to take 4 mg daily except 2 mg on Monday and Fridays.  Recheck in 4 weeks.

## 2018-05-01 NOTE — Progress Notes (Signed)
Agree. Thanks

## 2018-05-06 ENCOUNTER — Other Ambulatory Visit: Payer: Self-pay | Admitting: Family Medicine

## 2018-05-18 ENCOUNTER — Other Ambulatory Visit: Payer: Self-pay

## 2018-05-18 ENCOUNTER — Ambulatory Visit: Payer: Medicare Other | Admitting: Family Medicine

## 2018-05-18 ENCOUNTER — Encounter: Payer: Self-pay | Admitting: Family Medicine

## 2018-05-18 VITALS — BP 138/70 | HR 66 | Temp 97.5°F | Ht 71.0 in | Wt 242.8 lb

## 2018-05-18 DIAGNOSIS — S8011XD Contusion of right lower leg, subsequent encounter: Secondary | ICD-10-CM

## 2018-05-18 DIAGNOSIS — Z7901 Long term (current) use of anticoagulants: Secondary | ICD-10-CM

## 2018-05-18 NOTE — Progress Notes (Signed)
[name] Chenard T. Yuan Gann, MD [facility] [place] at [facility] [place], [address] Phone: [phone]   FAX: [phone]  [name] [name] - 68 y.o. male   MRN [id number]   Date of Birth: 06/04/1950  Visit Date: 05/18/2018   PCP: Tonia Ghent, MD   Referred by: Tonia Ghent, MD  Chief Complaint  Patient presents with   Knot on Right Leg   Subjective:   [name] [name] is a 68 y.o. very pleasant male patient who presents with the following:  DOI 04/28/2018  Knee normal.  He is a pleasant guy, he fell through a floor about 3 weeks ago and he has had a persistent proximal tibial swelling that he was worried about.  He has not really gone down much in size, and is not really hurting at all.  He is not really having any significant knee pain.  R hematoma  Past Medical History, Surgical History, Social History, Family History, Problem List, Medications, and Allergies have been reviewed and updated if relevant.  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 Normal chamber size with very mild left ventricular dysfunction.)   Blood transfusion    CHF (congestive heart failure) ([place])    Cough 06/28/2009   Dr. 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   Pericardial effusion 07/2009   admission to [facility] s/p pericardial window   Pleural effusion 10/2009   s/p thoracentesis   PNA (pneumonia) 10/2009   admnission to [facility], with right pleural effusion, s/p thoracentesis   Syncope 08/2009   admission for fever and syncope, no source for fever seen on cultures. Syncope thought to be r/t orthostasis    Past Surgical History:  Procedure Laterality Date   CARDIAC VALVE REPLACEMENT  07/10/2009   AVR   Cardiolite EKG  03/10/2003   (-) ? small/apical infarct EF 50%   DOPPLER ECHOCARDIOGRAPHY  01/18/97   LVH, mild dilated aorta - root mod to severe aortic regurg EF 50-55%   DOPPLER ECHOCARDIOGRAPHY  09/27/03   No changes, moderate severe aortic regurg   DOPPLER ECHOCARDIOGRAPHY  07/09/2004  Mod severe aortic regurg 2-3+ T.R., T.I.R., mild P..R.   DOPPLER ECHOCARDIOGRAPHY  04/01/06   Hypokin Apex EF 55%   DOPPLER ECHOCARDIOGRAPHY  02/15/2010   Mild LVH, mild decr Sys fctn EF 40-45% Mild Dias Dysfctn Mech AV Triv AR, MR   ESOPHAGOGASTRODUODENOSCOPY  1990, 1999   Hot dog in throat (1987)/ Chicken in throat (1990)/ 01/06/97 (Dr. Earlean Shawl) Impaction, dilatation, Schatzki's Ring/ 07/1998 repeat Dilatation/ 01/10/01 stricture/dilated/ 03/08/05 Schatzki's Ring, dilated/ 06/01/08 Food Disimpaction [name] Bulb Ulcer (Dr.  Watt Climes)   ESOPHAGOGASTRODUODENOSCOPY  01/06/1997   (Dr. Earlean Shawl) Impaction, dilatation, Schatzki's Ring   ESOPHAGOGASTRODUODENOSCOPY  08/09/98   Repeat dilatation Deatra Ina)   ESOPHAGOGASTRODUODENOSCOPY  01/13/2001   stricture/ HH, duodenal ulcer / bleeding Henrene Pastor)   ESOPHAGOGASTRODUODENOSCOPY  12/25/01   esophagus stricture, dilated   ESOPHAGOGASTRODUODENOSCOPY  03/23/2001   stricture, dilated   ESOPHAGOGASTRODUODENOSCOPY  03/08/05   Schatzki's Ring   03/13/05  Schatzki's Ring - dilated, duodenitis   ESOPHAGOGASTRODUODENOSCOPY  06/11/08   with food disimpaction.  Food impaction Ms [place]  Sm bulb ulcer (Dr. Watt Climes)   ESOPHAGOGASTRODUODENOSCOPY     stricture/HH, duodenal ulcer/bleeding Henrene Pastor)   ESOPHAGOGASTRODUODENOSCOPY N/A 01/23/2016   Procedure: ESOPHAGOGASTRODUODENOSCOPY (EGD);  Surgeon: [name], MD;  Location[facility] ENDOSCOPY;  Service: Endoscopy;  Laterality: N/A;   ESOPHAGOGASTRODUODENOSCOPY (EGD) WITH PROPOFOL N/A 02/06/2016   Procedure: ESOPHAGOGASTRODUODENOSCOPY (EGD) WITH PROPOFOL;  Surgeon: [name], MD;  Location: [facility] ENDOSCOPY;  Service: Endoscopy;  Laterality: N/A;   HERNIA REPAIR  2011   abdominal; post AVR   MRI  2/05   LVH global hypokin EF 48% Mod A.I.   VALVE REPLACEMENT  06/2009   St. Jude mechanical valve per Dr. Cyndia Bent    Social History   Socioeconomic History   Marital status: Married    Spouse name: Not on file   Number of children: 2   Years of education: Not on file   Highest education level: Not on file  Occupational History   Occupation: mech. Buyer, retail: [employer]    Comment: [place]  Social Designer, fashion/clothing strain: Not on file   Food insecurity:    Worry: Not on file    Inability: Not on file   Transportation needs:    Medical: Not on file    Non-medical: Not on file  Tobacco Use   Smoking status: Never Smoker   Smokeless tobacco: Never Used  Substance and Sexual Activity   Alcohol use:  No    Alcohol/week: 0.0 standard drinks   Drug use: No   Sexual activity: Not on file  Lifestyle   Physical activity:    Days per week: Not on file    Minutes per session: Not on file   Stress: Not on file  Relationships   Social connections:    Talks on phone: Not on file    Gets together: Not on file    Attends religious service: Not on file    Active member of club or organization: Not on file    Attends meetings of clubs or organizations: Not on file    Relationship status: Not on file   Intimate partner violence:    Fear of current or ex partner: Not on file    Emotionally abused: Not on file    Physically abused: Not on file    Forced sexual activity: Not on file  Other Topics Concern   Not on file  Social History Narrative   Married 1996  Family History  Problem Relation Age of Onset   Obesity Mother        bedridden   Hypertension Mother    Heart disease Father        CABG, CAD   Hypertension Father    Lymphoma Father    Cancer Father        lymphoma   Alcohol abuse Brother    Cancer Brother        melanoma   Colon cancer Paternal Aunt    Stomach cancer Neg Hx    Rectal cancer Neg Hx    Prostate cancer Neg Hx     Allergies  Allergen Reactions   Ace Inhibitors     REACTION: cough   Aspirin Other (See Comments)    Gastritis.    Primaxin [Imipenem W/Cilastatin Sodium]     Rash- but able to take amoxil w/o troubles.     Medication list reviewed and updated in full in [place].  GEN: No fevers, chills. Nontoxic. Primarily MSK c/o today. MSK: Detailed in the HPI GI: tolerating PO intake without difficulty Neuro: No numbness, parasthesias, or tingling associated. Otherwise the pertinent positives of the ROS are noted above.   Objective:   BP 138/70    Pulse 66    Temp (!) 97.5 F (36.4 C) (Oral)    Ht '5\' 11"'$  (1.803 m)    Wt 242 lb 12 oz (110.1 kg)    BMI 33.86 kg/m    GEN: WDWN, NAD, Non-toxic, Alert & Oriented  x 3 HEENT: Atraumatic, Normocephalic.  Ears and Nose: No external deformity. EXTR: No clubbing/cyanosis/edema NEURO: Normal gait.  PSYCH: Normally interactive. Conversant. Not depressed or anxious appearing.  Calm demeanor.    Right knee with full extension, full flexion, no effusion.  No medial or lateral joint line tenderness.  Stable to varus and valgus stress.  ACL is intact.  PCL is intact.  Negative McMurray's.  Negative bounce home.  On the distal tibia there is a fairly significant hematoma that is not tender and not red.  Radiology: Dg Tibia/fibula Right  Result Date: 04/30/2018 CLINICAL DATA:  68 year old who fell and injured the RIGHT LOWER leg. Initial encounter. EXAM: RIGHT TIBIA AND FIBULA - 2 VIEW COMPARISON:  None. FINDINGS: No evidence of acute fracture involving the tibia or fibula. Soft tissue swelling/ecchymosis overlying the proximal tibia anteriorly. Well-preserved bone mineral density. Ankle joint and knee joint intact with mild degenerative changes involving the knee joint. IMPRESSION: No acute osseous abnormality. Electronically Signed   By: Evangeline Dakin M.D.   On: 04/30/2018 11:29    Assessment and Plan:   Hematoma of leg, right, subsequent encounter  Warfarin anticoagulation  I reassured the patient.  This is a classical appearing hematoma at the proximal tibia which corresponds to the area of impact.  I would only be concerned if this shows signs of infection.  This is going to take a minimum of 6 to 8 weeks to resolve.  I did discuss with him sometimes the do get calcification long-term, approximately 15%.  Follow-up: No follow-ups on file.  Signed,  Maud Deed. Kiyoko Mcguirt, MD   Outpatient Encounter Medications as of 05/18/2018  Medication Sig   ACCU-CHEK AVIVA PLUS test strip USE AS DIRECTED TO CHECK BLOOD SUGAR 2 TO 4 TIMES A DAY   ACCU-CHEK FASTCLIX LANCETS MISC USE AS DIRECTED TO CHECK BLOOD SUGAR TWICE DAILY   albuterol (PROVENTIL) (2.5  MG/3ML) 0.083% nebulizer solution Take 3 mLs (2.5 mg total)  by nebulization every 6 (six) hours as needed for wheezing or shortness of breath.   amoxicillin (AMOXIL) 500 MG tablet Take 2,000 mg by mouth once. Before dental visits.   azelastine (ASTELIN) 0.1 % nasal spray Place 2 sprays into both nostrils 2 (two) times daily as needed for rhinitis. Use in each nostril as directed   benzonatate (TESSALON) 200 MG capsule TAKE 1 CAPSULE BY MOUTH 3 TIMES DAILY ASNEEDED FOR COUGH   Blood Glucose Monitoring Suppl (ACCU-CHEK AVIVA PLUS) W/DEVICE KIT 1 kit by Does not apply route once.   carvedilol (COREG) 6.25 MG tablet Take 1 tablet (6.25 mg total) by mouth 2 (two) times daily. NEEDS APPOINTMENT BEFORE NEXT REFILLS   diphenhydrAMINE (BENADRYL) 25 MG tablet Take 25 mg by mouth every 6 (six) hours as needed for itching.    docusate sodium (COLACE) 100 MG capsule Take 100 mg by mouth every other day.    furosemide (LASIX) 20 MG tablet TAKE 1 TABLET BY MOUTH ONCE A DAY   glipiZIDE (GLUCOTROL) 5 MG tablet Take 1 tablet (5 mg total) by mouth daily before breakfast. If AM sugar is above 200   Lancets Misc. (ACCU-CHEK FASTCLIX LANCET) KIT AS DIRECTED   losartan (COZAAR) 50 MG tablet TAKE 1 AND 1/2 TABLETS BY MOUTH ONCE DAILY   metFORMIN (GLUCOPHAGE) 500 MG tablet TAKE UP TO 2 TABLETS BY MOUTH TWICE A DAY   potassium chloride (K-DUR) 10 MEQ tablet TAKE 1 TABLET BY MOUTH TWICE (2) DAILY   pravastatin (PRAVACHOL) 20 MG tablet Take 1 tablet (20 mg total) by mouth daily.   triamcinolone cream (KENALOG) 0.5 % APPLY TO SKIN TWICE DAILY AS NEEDED   warfarin (COUMADIN) 1 MG tablet TAKE AS INSTRUCTED BY ANTOCOAGULATION CLINIC   warfarin (COUMADIN) 3 MG tablet TAKE AS DIRECTED BY COUMADIN CLINIC   No facility-administered encounter medications on file as of 05/18/2018.

## 2018-05-21 ENCOUNTER — Ambulatory Visit: Payer: Medicare Other

## 2018-05-28 ENCOUNTER — Other Ambulatory Visit (INDEPENDENT_AMBULATORY_CARE_PROVIDER_SITE_OTHER): Payer: Medicare Other

## 2018-05-28 ENCOUNTER — Ambulatory Visit (INDEPENDENT_AMBULATORY_CARE_PROVIDER_SITE_OTHER): Payer: Medicare Other | Admitting: General Practice

## 2018-05-28 ENCOUNTER — Other Ambulatory Visit: Payer: Self-pay

## 2018-05-28 DIAGNOSIS — Z7901 Long term (current) use of anticoagulants: Secondary | ICD-10-CM | POA: Diagnosis not present

## 2018-05-28 DIAGNOSIS — Z952 Presence of prosthetic heart valve: Secondary | ICD-10-CM

## 2018-05-28 DIAGNOSIS — I4891 Unspecified atrial fibrillation: Secondary | ICD-10-CM

## 2018-05-28 DIAGNOSIS — Z5181 Encounter for therapeutic drug level monitoring: Secondary | ICD-10-CM

## 2018-05-28 LAB — POCT INR: INR: 2.4 (ref 2.0–3.0)

## 2018-05-28 NOTE — Patient Instructions (Signed)
Pre visit review using our clinic review tool, if applicable. No additional management support is needed unless otherwise documented below in the visit note.  Continue to take 4 mg daily except 2 mg on Monday and Fridays.  Recheck in 6 weeks, 6/11 @ 8:30.  Patient has been given dosing instructions.

## 2018-05-28 NOTE — Progress Notes (Signed)
Agree. Thanks

## 2018-06-04 ENCOUNTER — Other Ambulatory Visit: Payer: Self-pay | Admitting: Family Medicine

## 2018-06-08 ENCOUNTER — Other Ambulatory Visit: Payer: Self-pay | Admitting: Family Medicine

## 2018-06-08 DIAGNOSIS — E119 Type 2 diabetes mellitus without complications: Secondary | ICD-10-CM

## 2018-06-09 ENCOUNTER — Other Ambulatory Visit (INDEPENDENT_AMBULATORY_CARE_PROVIDER_SITE_OTHER): Payer: Medicare Other

## 2018-06-09 DIAGNOSIS — E119 Type 2 diabetes mellitus without complications: Secondary | ICD-10-CM | POA: Diagnosis not present

## 2018-06-09 LAB — HEMOGLOBIN A1C: Hgb A1c MFr Bld: 8.1 % — ABNORMAL HIGH (ref 4.6–6.5)

## 2018-06-09 LAB — BASIC METABOLIC PANEL
BUN: 11 mg/dL (ref 6–23)
CO2: 26 mEq/L (ref 19–32)
Calcium: 9.2 mg/dL (ref 8.4–10.5)
Chloride: 100 mEq/L (ref 96–112)
Creatinine, Ser: 0.87 mg/dL (ref 0.40–1.50)
GFR: 87.72 mL/min (ref >60.00)
Glucose, Bld: 171 mg/dL — ABNORMAL HIGH (ref 70–99)
Potassium: 4.8 mEq/L (ref 3.5–5.1)
Sodium: 134 mEq/L — ABNORMAL LOW (ref 135–145)

## 2018-06-11 ENCOUNTER — Ambulatory Visit (INDEPENDENT_AMBULATORY_CARE_PROVIDER_SITE_OTHER): Payer: Medicare Other | Admitting: Family Medicine

## 2018-06-11 DIAGNOSIS — E119 Type 2 diabetes mellitus without complications: Secondary | ICD-10-CM

## 2018-06-11 DIAGNOSIS — Z5181 Encounter for therapeutic drug level monitoring: Secondary | ICD-10-CM

## 2018-06-11 DIAGNOSIS — Z952 Presence of prosthetic heart valve: Secondary | ICD-10-CM

## 2018-06-11 MED ORDER — METFORMIN HCL 500 MG PO TABS: ORAL_TABLET | ORAL | 3 refills | Status: DC

## 2018-06-11 NOTE — Assessment & Plan Note (Signed)
DM2. No change in meds and recheck in about 3 months (~08/2018) with A1c along with that month's INR.  Continue work on diet and exercise.  I want to avoid hypoglycemia and his A1c is improved off glipizide.

## 2018-06-11 NOTE — Progress Notes (Signed)
Virtual visit completed through WebEx or similar program Patient location: home  Provider location: [place] at [facility], office   Limitations and rationale for visit method d/w patient.  Patient agreed to proceed.   CC: DM2.   HPI: Diabetes:  Using medications without difficulties: he is on metformin 1000mg  BID.  Hypoglycemic episodes: no Hyperglycemic episodes: no Feet problems:no Blood Sugars averaging: usually <200 except for rare readings >200.  Max reading in March was 170.  eye exam within last year: yes He hasn't been able to go to the gym recently.   A1c not at goal but improved from prev.   He is working on diet.   He had dental work deferred given the pandemic.  No dental pain now.  He is trying to get f/u scheduled.   Meds and allergies reviewed.   ROS: Per HPI unless specifically indicated in ROS section   NAD Speech wnl  A/P: DM2. No change in meds and recheck in about 3 months (~08/2018) with A1c along with that month's INR.  Continue work on diet and exercise.  I want to avoid hypoglycemia and his A1c is improved off glipizide.   He had dental work deferred given the pandemic.  No dental pain now.  He is trying to get f/u scheduled. I asked him to update [place] when he has plans for dental work in case we need to adjust his coumadin  He agreed.

## 2018-06-11 NOTE — Assessment & Plan Note (Signed)
  He had dental work deferred given the pandemic.  No dental pain now.  He is trying to get f/u scheduled. I asked him to update [place] when he has plans for dental work in case we need to adjust his coumadin  He agreed.

## 2018-06-15 ENCOUNTER — Other Ambulatory Visit: Payer: Self-pay | Admitting: Family Medicine

## 2018-06-29 ENCOUNTER — Other Ambulatory Visit: Payer: Self-pay | Admitting: Family Medicine

## 2018-06-30 NOTE — Telephone Encounter (Signed)
Patient is compliant with med management will refill X 6 months.

## 2018-07-09 ENCOUNTER — Other Ambulatory Visit: Payer: Self-pay

## 2018-07-09 ENCOUNTER — Ambulatory Visit (INDEPENDENT_AMBULATORY_CARE_PROVIDER_SITE_OTHER): Payer: Medicare Other

## 2018-07-09 ENCOUNTER — Other Ambulatory Visit (INDEPENDENT_AMBULATORY_CARE_PROVIDER_SITE_OTHER): Payer: Medicare Other

## 2018-07-09 DIAGNOSIS — I4891 Unspecified atrial fibrillation: Secondary | ICD-10-CM

## 2018-07-09 DIAGNOSIS — Z5181 Encounter for therapeutic drug level monitoring: Secondary | ICD-10-CM

## 2018-07-09 DIAGNOSIS — Z7901 Long term (current) use of anticoagulants: Secondary | ICD-10-CM

## 2018-07-09 DIAGNOSIS — Z952 Presence of prosthetic heart valve: Secondary | ICD-10-CM

## 2018-07-09 LAB — POCT INR
INR: 2.3 (ref 2.0–3.0)
INR: 2.3 (ref 2.0–3.0)

## 2018-07-09 NOTE — Patient Instructions (Addendum)
Pre visit review using our clinic review tool, if applicable. No additional management support is needed unless otherwise documented below in the visit note.  Continue to take 4 mg daily except 2 mg on Monday and Fridays.  Recheck in 6 weeks.   

## 2018-07-15 ENCOUNTER — Other Ambulatory Visit: Payer: Self-pay | Admitting: Cardiology

## 2018-07-15 MED ORDER — CARVEDILOL 6.25 MG PO TABS: 6.2500 mg | ORAL_TABLET | Freq: Two times a day (BID) | ORAL | 3 refills | Status: DC

## 2018-07-15 NOTE — Telephone Encounter (Signed)
Rx(s) sent to pharmacy electronically.  

## 2018-07-15 NOTE — Telephone Encounter (Signed)
°*  STAT* If patient is at the pharmacy, call can be transferred to refill team.   1. Which medications need to be refilled? (please list name of each medication and dose if known) carvedilol (COREG) 6.25 MG tablet  2. Which pharmacy/location (including street and city if local pharmacy) is medication to be sent to? [place], [place]  3. Do they need a 30 day or 90 day supply? 90 days  Patient only has two pills left.

## 2018-08-20 ENCOUNTER — Ambulatory Visit (INDEPENDENT_AMBULATORY_CARE_PROVIDER_SITE_OTHER): Payer: Medicare Other | Admitting: General Practice

## 2018-08-20 ENCOUNTER — Other Ambulatory Visit: Payer: Self-pay

## 2018-08-20 DIAGNOSIS — Z7901 Long term (current) use of anticoagulants: Secondary | ICD-10-CM

## 2018-08-20 DIAGNOSIS — I4891 Unspecified atrial fibrillation: Secondary | ICD-10-CM

## 2018-08-20 DIAGNOSIS — Z952 Presence of prosthetic heart valve: Secondary | ICD-10-CM

## 2018-08-20 LAB — POCT INR: INR: 2.6 (ref 2.0–3.0)

## 2018-08-20 NOTE — Patient Instructions (Addendum)
Pre visit review using our clinic review tool, if applicable. No additional management support is needed unless otherwise documented below in the visit note.  Continue to take 4 mg daily except 2 mg on Monday and Fridays.  Recheck in 6 weeks.   

## 2018-09-02 ENCOUNTER — Other Ambulatory Visit: Payer: Self-pay | Admitting: Family Medicine

## 2018-09-25 ENCOUNTER — Other Ambulatory Visit: Payer: Self-pay | Admitting: Family Medicine

## 2018-09-25 NOTE — Telephone Encounter (Signed)
Patient is compliant with coumadin management will refill X 6mths.   

## 2018-10-02 ENCOUNTER — Other Ambulatory Visit: Payer: Self-pay | Admitting: Family Medicine

## 2018-10-02 NOTE — Telephone Encounter (Signed)
Patient is compliant with coumadin management, will refill X 6 months.   

## 2018-10-08 ENCOUNTER — Ambulatory Visit (INDEPENDENT_AMBULATORY_CARE_PROVIDER_SITE_OTHER): Payer: Medicare Other | Admitting: General Practice

## 2018-10-08 DIAGNOSIS — I4891 Unspecified atrial fibrillation: Secondary | ICD-10-CM

## 2018-10-08 DIAGNOSIS — Z7901 Long term (current) use of anticoagulants: Secondary | ICD-10-CM

## 2018-10-08 DIAGNOSIS — Z952 Presence of prosthetic heart valve: Secondary | ICD-10-CM

## 2018-10-08 LAB — POCT INR: INR: 2 (ref 2.0–3.0)

## 2018-10-08 NOTE — Patient Instructions (Addendum)
Pre visit review using our clinic review tool, if applicable. No additional management support is needed unless otherwise documented below in the visit note.  Continue to take 4 mg daily except 2 mg on Monday and Fridays.  Recheck in 6 weeks.   

## 2018-10-11 NOTE — Progress Notes (Signed)
Agree. Thanks

## 2018-10-23 ENCOUNTER — Other Ambulatory Visit: Payer: Self-pay | Admitting: Family Medicine

## 2018-11-19 ENCOUNTER — Ambulatory Visit (INDEPENDENT_AMBULATORY_CARE_PROVIDER_SITE_OTHER): Payer: Medicare Other | Admitting: General Practice

## 2018-11-19 ENCOUNTER — Other Ambulatory Visit: Payer: Self-pay

## 2018-11-19 DIAGNOSIS — Z7901 Long term (current) use of anticoagulants: Secondary | ICD-10-CM | POA: Diagnosis not present

## 2018-11-19 DIAGNOSIS — I4891 Unspecified atrial fibrillation: Secondary | ICD-10-CM

## 2018-11-19 DIAGNOSIS — Z23 Encounter for immunization: Secondary | ICD-10-CM | POA: Diagnosis not present

## 2018-11-19 DIAGNOSIS — Z952 Presence of prosthetic heart valve: Secondary | ICD-10-CM

## 2018-11-19 LAB — POCT INR: INR: 2.3 (ref 2.0–3.0)

## 2018-11-19 NOTE — Patient Instructions (Signed)
Pre visit review using our clinic review tool, if applicable. No additional management support is needed unless otherwise documented below in the visit note.  Continue to take 4 mg daily except 2 mg on Monday and Fridays.  Recheck in 6 weeks.   

## 2018-11-19 NOTE — Progress Notes (Signed)
Agree. Thanks

## 2018-12-13 ENCOUNTER — Other Ambulatory Visit: Payer: Self-pay | Admitting: Family Medicine

## 2018-12-13 DIAGNOSIS — E119 Type 2 diabetes mellitus without complications: Secondary | ICD-10-CM

## 2018-12-13 DIAGNOSIS — I4891 Unspecified atrial fibrillation: Secondary | ICD-10-CM

## 2018-12-16 ENCOUNTER — Ambulatory Visit (INDEPENDENT_AMBULATORY_CARE_PROVIDER_SITE_OTHER): Payer: Medicare Other

## 2018-12-16 ENCOUNTER — Ambulatory Visit: Payer: Medicare Other

## 2018-12-16 DIAGNOSIS — Z0000 Encounter for general adult medical examination without abnormal findings: Secondary | ICD-10-CM

## 2018-12-16 NOTE — Progress Notes (Signed)
Subjective:   [name] [name] is a 69 y.o. male who presents for Medicare Annual/Subsequent preventive examination.  Review of Systems: N/A   This visit is being conducted through telemedicine via telephone at the nurse health advisor's home address due to the COVID-19 pandemic. This patient has given me verbal consent via doximity to conduct this visit, patient states they are participating from their home address. Patient and myself are on the telephone call. There is no referral for this visit. Some vital signs may be absent or patient reported.    Patient identification: identified by name, DOB, and current address   Cardiac Risk Factors include: advanced age (>52mn, >>7women);diabetes mellitus;male gender;hypertension;Other (see comment), Risk factor comments: hypercholesterolemia     Objective:    Vitals: There were no vitals taken for this visit.  There is no height or weight on file to calculate BMI.  Advanced Directives 12/16/2018 12/10/2017 12/02/2016 02/06/2016 01/22/2016 12/05/2010  Does Patient Have a Medical Advance Directive? Yes Yes No No No Patient has advance directive, copy in chart  Type of Advance Directive [place] - - - [place]  Copy of [place] Chart? No - copy requested Yes - validated most recent copy scanned in chart (See row information) - - - -  Would patient like information on creating a medical advance directive? - - - No - Patient declined No - Patient declined -    Tobacco Social History   Tobacco Use  Smoking Status Never Smoker  Smokeless Tobacco Never Used     Counseling given: Not Answered   Clinical Intake:  Pre-visit preparation completed: Yes  Pain : No/denies pain     Nutritional Risks: None Diabetes: Yes CBG done?: No Did pt. bring in CBG monitor from home?: No  How often do you need to have  someone help you when you read instructions, pamphlets, or other written materials from your doctor or pharmacy?: 1 - Never What is the last grade level you completed in school?: 2 years of college  Interpreter Needed?: No  Information entered by :: CJohnson, LPN  Past Medical History:  Diagnosis Date   Anemia    Aortic insufficiency    with bicuspid aortic valve (the last MRI demonstrated the aortic root to be 4.8 x 4.7 cm.) Mod regurgitation. Normal chamber size with very mild left ventricular dysfunction.)   Blood transfusion    [place]    Cough 06/28/2009   Dr. 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   [facility]icardial effusion 07[facility]2009   [facility]   Pleural effusion 10/2009   s/p thoracentesis   PNA (pneumonia) [facility]2009   [facility]/p [facility]   Syncope 08/2009   admission for fever and syncope, no source for fever seen on cultures. 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  small/apical infarct EF 50%   DOPPLER ECHOCARDIOGRAPHY  01/18/97   LVH, mild dilated aorta - root mod to severe aortic regurg EF 50-55%   DOPPLER ECHOCARDIOGRAPHY  09/27/03   No changes, moderate severe aortic regurg   DOPPLER ECHOCARDIOGRAPHY  07/09/2004   Mod severe aortic regurg 2-3+ T.R., T.I.R., mild P..R.   DOPPLER ECHOCARDIOGRAPHY  04/01/06   Hypokin Apex EF 55%   DOPPLER ECHOCARDIOGRAPHY  02/15/2010   Mild  LVH, mild decr Sys fctn EF 40-45% Mild Dias Dysfctn Mech AV Triv AR, MR   ESOPHAGOGASTRODUODENOSCOPY  1990, 1999   Hot dog in throat (1987)/ Chicken in throat (1990)/ 01/06/97 (Dr. Earlean Shawl) Impaction, dilatation, Schatzki's Ring/ 07/1998 repeat Dilatation/ 01/10/01 stricture/dilated/ 03/08/05 Schatzki's Ring, dilated/ 06/01/08 Food Disimpaction [name] Bulb Ulcer (Dr. Watt Climes)   ESOPHAGOGASTRODUODENOSCOPY  01/06/1997   (Dr. Earlean Shawl) Impaction, dilatation, Schatzki's Ring   ESOPHAGOGASTRODUODENOSCOPY  08/09/98   Repeat dilatation Deatra Ina)   ESOPHAGOGASTRODUODENOSCOPY  01/13/2001   stricture/ HH, duodenal ulcer / bleeding Henrene Pastor)   ESOPHAGOGASTRODUODENOSCOPY  12/25/01   esophagus stricture, dilated   ESOPHAGOGASTRODUODENOSCOPY  03/23/2001   stricture, dilated   ESOPHAGOGASTRODUODENOSCOPY  03/08/05   Schatzki's Ring   03/13/05  Schatzki's Ring - dilated, duodenitis   ESOPHAGOGASTRODUODENOSCOPY  06/11/08   with food disimpaction.  Food impaction Ms [place]  Sm bulb ulcer (Dr. Watt Climes)   ESOPHAGOGASTRODUODENOSCOPY     stricture/HH, duodenal ulcer/bleeding Henrene Pastor)   ESOPHAGOGASTRODUODENOSCOPY N/A 01/23/2016   Procedure: ESOPHAGOGASTRODUODENOSCOPY (EGD);  Surgeon: [name], MD;  [facility] ENDOSCOPY;  Service: Endoscopy;  Laterality: N/A;   ESOPHAGOGASTRODUODENOSCOPY (EGD) WITH PROPOFOL N/A 02/06/2016   Procedure: ESOPHAGOGASTRODUODENOSCOPY (EGD) WITH PROPOFOL;  Surgeon: [name], MD;  Location: [facility] ENDOSCOPY;  Service: Endoscopy;  Laterality: N/A;   HERNIA REPAIR  2011   abdominal; post AVR   MRI  2/05   LVH global hypokin EF 48% Mod A.I.   VALVE REPLACEMENT  06/2009   St. Jude mechanical valve per Dr. Cyndia Bent   Family History  Problem Relation Age of Onset   Obesity Mother        bedridden   Hypertension Mother    Heart disease Father        CABG, CAD   Hypertension Father    Lymphoma Father    Cancer Father        lymphoma   Alcohol abuse Brother    Cancer Brother         melanoma   Colon cancer Paternal Aunt    Stomach cancer Neg Hx    Rectal cancer Neg Hx    Prostate cancer Neg Hx    Social History   Socioeconomic History   Marital status: Married    Spouse name: Not on file   Number of children: 2   Years of education: Not on file   Highest education level: Not on file  Occupational History   Occupation: mech. supervisor    Employer: [employer]    Comment: [place]  Social Designer, fashion/clothing strain: Not hard at all   Food insecurity    Worry: Never true    Inability: Never true   Transportation needs    Medical: No    Non-medical: No  Tobacco Use   Smoking status: Never Smoker   Smokeless tobacco: Never Used  Substance and Sexual Activity   Alcohol use: No    Alcohol/week: 0.0 standard drinks   Drug use: No   Sexual activity: Not on file  Lifestyle  Physical activity    Days per week: 0 days    Minutes per session: 0 min   Stress: Not at all  Relationships   Social connections    Talks on phone: Not on file    Gets together: Not on file    Attends religious service: Not on file    Active member of club or organization: Not on file    Attends meetings of clubs or organizations: Not on file    Relationship status: Not on file  Other Topics Concern   Not on file  Social History Narrative   Married 1996    Outpatient Encounter Medications as of 12/16/2018  Medication Sig   ACCU-CHEK AVIVA PLUS test strip USE AS DIRECTED TO CHECK BLOOD SUGAR 2 TO 4 TIMES A DAY   ACCU-CHEK FASTCLIX LANCETS MISC USE AS DIRECTED TO CHECK BLOOD SUGAR TWICE DAILY   albuterol (PROVENTIL) (2.5 MG/3ML) 0.083% nebulizer solution Take 3 mLs (2.5 mg total) by nebulization every 6 (six) hours as needed for wheezing or shortness of breath.   amoxicillin (AMOXIL) 500 MG tablet Take 2,000 mg by mouth once. Before dental visits.   azelastine (ASTELIN) 0.1 % nasal spray Place 2 sprays into both nostrils 2 (two)  times daily as needed for rhinitis. Use in each nostril as directed   Blood Glucose Monitoring Suppl (ACCU-CHEK AVIVA PLUS) W/DEVICE KIT 1 kit by Does not apply route once.   carvedilol (COREG) 6.25 MG tablet Take 1 tablet (6.25 mg total) by mouth 2 (two) times daily.   diphenhydrAMINE (BENADRYL) 25 MG tablet Take 25 mg by mouth every 6 (six) hours as needed for itching.    docusate sodium (COLACE) 100 MG capsule Take 100 mg by mouth every other day.    furosemide (LASIX) 20 MG tablet TAKE 1 TABLET BY MOUTH ONCE DAILY   glipiZIDE (GLUCOTROL) 5 MG tablet TAKE 1 TABLET BY MOUTH DAILY BEFORE BREAKFAST IF AM SUGAR IS ABOVE 200   Lancets Misc. (ACCU-CHEK FASTCLIX LANCET) KIT AS DIRECTED   losartan (COZAAR) 50 MG tablet TAKE 1.5 TABLETS BY  MOUTH ONCE DAILY   metFORMIN (GLUCOPHAGE) 500 MG tablet TAKE 2 TABLETS BY MOUTH TWICE A DAY.  HOLD RX FOR PATIENT TO REQUEST.   potassium chloride (K-DUR) 10 MEQ tablet TAKE 1 TABLET BY MOUTH 2 TIMES DAILY   pravastatin (PRAVACHOL) 20 MG tablet TAKE 1 TABLET BY MOUTH ONCE DAILY   triamcinolone cream (KENALOG) 0.5 % APPLY TO SKIN TWICE DAILY AS NEEDED   warfarin (COUMADIN) 1 MG tablet TAKE AS DIRECTED BY ANTICOAGULATION CLINIC   warfarin (COUMADIN) 3 MG tablet TAKE AS DIRECTED BY COUMADIN CLINIC   No facility-administered encounter medications on file as of 12/16/2018.     Activities of Daily Living In your present state of health, do you have any difficulty performing the following activities: 12/16/2018  Hearing? N  Vision? N  Difficulty concentrating or making decisions? N  Walking or climbing stairs? N  Dressing or bathing? N  Doing errands, shopping? N  Preparing Food and eating ? N  Using the Toilet? N  In the past six months, have you accidently leaked urine? N  Do you have problems with loss of bowel control? N  Managing your Medications? N  Managing your Finances? N  Housekeeping or managing your Housekeeping? N  Some recent data  might be hidden    Patient Care Team: Tonia Ghent, MD as PCP - General (Family Medicine) Erroll Luna, MD as Consulting Physician (  General Surgery)   Assessment:   This is a routine wellness examination for Avelardo.  Exercise Activities and Dietary recommendations Current Exercise Habits: The patient does not participate in regular exercise at present, Exercise limited by: None identified  Goals     Increase water intake     Starting 12/10/2017, I will attempt to drink at least 6-8 glasses of water daily.      Patient Stated     12/16/2018, I will maintain and continue medications as prescribed.        Fall Risk Fall Risk  12/16/2018 12/10/2017 12/02/2016 05/18/2015  Falls in the past year? 0 0 No No  Number falls in past yr: 0 - - -  Injury with Fall? 0 - - -  Risk for fall due to : Medication side effect - - -  Follow up Falls evaluation completed;Falls prevention discussed - - -   Is the patient's home free of loose throw rugs in walkways, pet beds, electrical cords, etc?   yes      Grab bars in the bathroom? yes      Handrails on the stairs?   yes      Adequate lighting?   yes  Timed Get Up and Go Performed: N/A  Depression Screen PHQ 2/9 Scores 12/16/2018 12/10/2017 12/02/2016 05/18/2015  PHQ - 2 Score 0 0 0 0  PHQ- 9 Score 0 0 0 -    Cognitive Function MMSE - Mini Mental State Exam 12/16/2018 12/10/2017 12/02/2016  Orientation to time '5 5 5  '$ Orientation to Place '5 5 5  '$ Registration '3 3 3  '$ Attention/ Calculation 5 0 0  Recall '3 3 3  '$ Language- name 2 objects - 0 0  Language- repeat '1 1 1  '$ Language- follow 3 step command - 3 3  Language- read & follow direction - 0 0  Write a sentence - 0 0  Copy design - 0 0  Total score - 20 20  Mini Cog  Mini-Cog screen was completed. Maximum score is 22. A value of 0 denotes this part of the MMSE was not completed or the patient failed this part of the Mini-Cog screening.       Immunization History    Administered Date(s) Administered   Fluad Quad(high Dose 65+) 11/19/2018   Influenza Split 10/25/2010, 11/28/2011   Influenza Whole 10/28/2009   Influenza,inj,Quad PF,6+ Mos 11/23/2012, 11/01/2013, 11/16/2014, 12/01/2015, 12/03/2016, 12/10/2017   Pneumococcal Conjugate-13 06/02/2017   Pneumococcal Polysaccharide-23 01/28/2009   Td 11/21/1997   Tdap 04/01/2013   Zoster 05/16/2014    Qualifies for Shingles Vaccine? Yes  Screening Tests Health Maintenance  Topic Date Due   PNA vac Low Risk Adult (2 of 2 - PPSV23) 06/03/2018   OPHTHALMOLOGY EXAM  10/03/2018   HEMOGLOBIN A1C  12/10/2018   FOOT EXAM  12/12/2018   COLONOSCOPY  07/10/2021   TETANUS/TDAP  04/02/2023   INFLUENZA VACCINE  Completed   Hepatitis C Screening  Completed   Cancer Screenings: Lung: Low Dose CT Chest recommended if Age 50-80 years, 30 pack-year currently smoking OR have quit w/in 15years. Patient does not qualify. Colorectal: completed 07/11/2011  Additional Screenings:  Hepatitis C Screening: 08/02/2009      Plan:    Patient will maintain and continue medications as prescribed.   I have personally reviewed and noted the following in the patients chart:    Medical and social history  Use of alcohol, tobacco or illicit drugs   Current medications and supplements  Functional ability and status  Nutritional status  Physical activity  Advanced directives  List of other physicians  Hospitalizations, surgeries, and ER visits in previous 12 months  Vitals  Screenings to include cognitive, depression, and falls  Referrals and appointments  In addition, I have reviewed and discussed with patient certain preventive protocols, quality metrics, and best practice recommendations. A written personalized care plan for preventive services as well as general preventive health recommendations were provided to patient.     Andrez Grime, LPN  [id number]

## 2018-12-16 NOTE — Patient Instructions (Signed)
[name] , Thank you for taking time to come for your Medicare Wellness Visit. I appreciate your ongoing commitment to your health goals. Please review the following plan we discussed and let me know if I can assist you in the future.   Screening recommendations/referrals: Colonoscopy: Up to date, completed 07/11/2011 Recommended yearly ophthalmology/optometry visit for glaucoma screening and checkup Recommended yearly dental visit for hygiene and checkup  Vaccinations: Influenza vaccine: Up to date, completed 11/19/2018 Pneumococcal vaccine: will get at office visit Tdap vaccine: Up to date, completed 04/01/2013 Shingles vaccine: will check with insurance    Advanced directives: Please bring a copy of your POA (Power of [place]) and/or Living Will to your next appointment.   Conditions/risks identified: diabetes, hypertension, hypercholesterolemia  Next appointment: 12/22/2018 @ 9:45 am   Preventive Care 65 Years and Older, Male Preventive care refers to lifestyle choices and visits with your health care provider that can promote health and wellness. What does preventive care include?  A yearly physical exam. This is also called an annual well check.  Dental exams once or twice a year.  Routine eye exams. Ask your health care provider how often you should have your eyes checked.  Personal lifestyle choices, including:  Daily care of your teeth and gums.  Regular physical activity.  Eating a healthy diet.  Avoiding tobacco and drug use.  Limiting alcohol use.  Practicing safe sex.  Taking low doses of aspirin every day.  Taking vitamin and mineral supplements as recommended by your health care provider. What happens during an annual well check? The services and screenings done by your health care provider during your annual well check will depend on your age, overall health, lifestyle risk factors, and family history of disease. Counseling  Your health care  provider may ask you questions about your:  Alcohol use.  Tobacco use.  Drug use.  Emotional well-being.  Home and relationship well-being.  Sexual activity.  Eating habits.  History of falls.  Memory and ability to understand (cognition).  Work and work Statistician. Screening  You may have the following tests or measurements:  Height, weight, and BMI.  Blood pressure.  Lipid and cholesterol levels. These may be checked every 5 years, or more frequently if you are over 23 years old.  Skin check.  Lung cancer screening. You may have this screening every year starting at age 73 if you have a 30-pack-year history of smoking and currently smoke or have quit within the past 15 years.  Fecal occult blood test (FOBT) of the stool. You may have this test every year starting at age 19.  Flexible sigmoidoscopy or colonoscopy. You may have a sigmoidoscopy every 5 years or a colonoscopy every 10 years starting at age 84.  Prostate cancer screening. Recommendations will vary depending on your family history and other risks.  Hepatitis C blood test.  Hepatitis B blood test.  Sexually transmitted disease (STD) testing.  Diabetes screening. This is done by checking your blood sugar (glucose) after you have not eaten for a while (fasting). You may have this done every 1-3 years.  Abdominal aortic aneurysm (AAA) screening. You may need this if you are a current or former smoker.  Osteoporosis. You may be screened starting at age 60 if you are at high risk. Talk with your health care provider about your test results, treatment options, and if necessary, the need for more tests. Vaccines  Your health care provider may recommend certain vaccines, such as:  Influenza  vaccine. This is recommended every year.  Tetanus, diphtheria, and acellular pertussis (Tdap, Td) vaccine. You may need a Td booster every 10 years.  Zoster vaccine. You may need this after age 46.  Pneumococcal  13-valent conjugate (PCV13) vaccine. One dose is recommended after age 22.  Pneumococcal polysaccharide (PPSV23) vaccine. One dose is recommended after age 2. Talk to your health care provider about which screenings and vaccines you need and how often you need them. This information is not intended to replace advice given to you by your health care provider. Make sure you discuss any questions you have with your health care provider. Document Released: 02/10/2015 Document Revised: 10/04/2015 Document Reviewed: 11/15/2014 Elsevier Interactive Patient Education  2017 [place] Prevention in the Home Falls can cause injuries. They can happen to people of all ages. There are many things you can do to make your home safe and to help prevent falls. What can I do on the outside of my home?  Regularly fix the edges of walkways and driveways and fix any cracks.  Remove anything that might make you trip as you walk through a door, such as a raised step or threshold.  Trim any bushes or trees on the path to your home.  Use bright outdoor lighting.  Clear any walking paths of anything that might make someone trip, such as rocks or tools.  Regularly check to see if handrails are loose or broken. Make sure that both sides of any steps have handrails.  Any raised decks and porches should have guardrails on the edges.  Have any leaves, snow, or ice cleared regularly.  Use sand or salt on walking paths during winter.  Clean up any spills in your garage right away. This includes oil or grease spills. What can I do in the bathroom?  Use night lights.  Install grab bars by the toilet and in the tub and shower. Do not use towel bars as grab bars.  Use non-skid mats or decals in the tub or shower.  If you need to sit down in the shower, use a plastic, non-slip stool.  Keep the floor dry. Clean up any water that spills on the floor as soon as it happens.  Remove soap buildup in the  tub or shower regularly.  Attach bath mats securely with double-sided non-slip rug tape.  Do not have throw rugs and other things on the floor that can make you trip. What can I do in the bedroom?  Use night lights.  Make sure that you have a light by your bed that is easy to reach.  Do not use any sheets or blankets that are too big for your bed. They should not hang down onto the floor.  Have a firm chair that has side arms. You can use this for support while you get dressed.  Do not have throw rugs and other things on the floor that can make you trip. What can I do in the kitchen?  Clean up any spills right away.  Avoid walking on wet floors.  Keep items that you use a lot in easy-to-reach places.  If you need to reach something above you, use a strong step stool that has a grab bar.  Keep electrical cords out of the way.  Do not use floor polish or wax that makes floors slippery. If you must use wax, use non-skid floor wax.  Do not have throw rugs and other things on the floor that can  make you trip. What can I do with my stairs?  Do not leave any items on the stairs.  Make sure that there are handrails on both sides of the stairs and use them. Fix handrails that are broken or loose. Make sure that handrails are as long as the stairways.  Check any carpeting to make sure that it is firmly attached to the stairs. Fix any carpet that is loose or worn.  Avoid having throw rugs at the top or bottom of the stairs. If you do have throw rugs, attach them to the floor with carpet tape.  Make sure that you have a light switch at the top of the stairs and the bottom of the stairs. If you do not have them, ask someone to add them for you. What else can I do to help prevent falls?  Wear shoes that:  Do not have high heels.  Have rubber bottoms.  Are comfortable and fit you well.  Are closed at the toe. Do not wear sandals.  If you use a stepladder:  Make sure that it  is fully opened. Do not climb a closed stepladder.  Make sure that both sides of the stepladder are locked into place.  Ask someone to hold it for you, if possible.  Clearly mark and make sure that you can see:  Any grab bars or handrails.  First and last steps.  Where the edge of each step is.  Use tools that help you move around (mobility aids) if they are needed. These include:  Canes.  Walkers.  Scooters.  Crutches.  Turn on the lights when you go into a dark area. Replace any light bulbs as soon as they burn out.  Set up your furniture so you have a clear path. Avoid moving your furniture around.  If any of your floors are uneven, fix them.  If there are any pets around you, be aware of where they are.  Review your medicines with your doctor. Some medicines can make you feel dizzy. This can increase your chance of falling. Ask your doctor what other things that you can do to help prevent falls. This information is not intended to replace advice given to you by your health care provider. Make sure you discuss any questions you have with your health care provider. Document Released: 11/10/2008 Document Revised: 06/22/2015 Document Reviewed: 02/18/2014 Elsevier Interactive Patient Education  2017 [employer].

## 2018-12-16 NOTE — Progress Notes (Signed)
PCP notes:  Health Maintenance: Needs pneumovax 23 vaccine Eye exam scheduled for next month per patient  Abnormal Screenings: none  Patient concerns: none   Nurse concerns: none   Next PCP appt.: 12/22/2018 @ 9:45 am

## 2018-12-17 ENCOUNTER — Other Ambulatory Visit (INDEPENDENT_AMBULATORY_CARE_PROVIDER_SITE_OTHER): Payer: Medicare Other

## 2018-12-17 DIAGNOSIS — E119 Type 2 diabetes mellitus without complications: Secondary | ICD-10-CM

## 2018-12-17 DIAGNOSIS — I4891 Unspecified atrial fibrillation: Secondary | ICD-10-CM | POA: Diagnosis not present

## 2018-12-17 LAB — LIPID PANEL
Cholesterol: 165 mg/dL (ref 0–200)
HDL: 44.5 mg/dL (ref >39.00)
LDL Cholesterol: 94 mg/dL (ref 0–99)
NonHDL: 120.64
Total CHOL/HDL Ratio: 4
Triglycerides: 134 mg/dL (ref 0.0–149.0)
VLDL: 26.8 mg/dL (ref 0.0–40.0)

## 2018-12-17 LAB — COMPREHENSIVE METABOLIC PANEL
ALT: 33 U/L (ref 0–53)
AST: 36 U/L (ref 0–37)
Albumin: 4.5 g/dL (ref 3.5–5.2)
Alkaline Phosphatase: 66 U/L (ref 39–117)
BUN: 11 mg/dL (ref 6–23)
CO2: 26 mEq/L (ref 19–32)
Calcium: 9.3 mg/dL (ref 8.4–10.5)
Chloride: 103 mEq/L (ref 96–112)
Creatinine, Ser: 0.95 mg/dL (ref 0.40–1.50)
GFR: 79.13 mL/min (ref >60.00)
Glucose, Bld: 135 mg/dL — ABNORMAL HIGH (ref 70–99)
Potassium: 4.5 mEq/L (ref 3.5–5.1)
Sodium: 138 mEq/L (ref 135–145)
Total Bilirubin: 0.9 mg/dL (ref 0.2–1.2)
Total Protein: 7.3 g/dL (ref 6.0–8.3)

## 2018-12-17 LAB — CBC WITH DIFFERENTIAL/PLATELET
Basophils Absolute: 0 10*3/uL (ref 0.0–0.1)
Basophils Relative: 0.9 % (ref 0.0–3.0)
Eosinophils Absolute: 0.2 10*3/uL (ref 0.0–0.7)
Eosinophils Relative: 4.1 % (ref 0.0–5.0)
HCT: 44.1 % (ref 39.0–52.0)
Hemoglobin: 15.2 g/dL (ref 13.0–17.0)
Lymphocytes Relative: 37.5 % (ref 12.0–46.0)
Lymphs Abs: 2 10*3/uL (ref 0.7–4.0)
MCHC: 34.5 g/dL (ref 30.0–36.0)
MCV: 91.8 fl (ref 78.0–100.0)
Monocytes Absolute: 0.6 10*3/uL (ref 0.1–1.0)
Monocytes Relative: 10.9 % (ref 3.0–12.0)
Neutro Abs: 2.5 10*3/uL (ref 1.4–7.7)
Neutrophils Relative %: 46.6 % (ref 43.0–77.0)
Platelets: 163 10*3/uL (ref 150.0–400.0)
RBC: 4.8 Mil/uL (ref 4.22–5.81)
RDW: 13.9 % (ref 11.5–15.5)
WBC: 5.4 10*3/uL (ref 4.0–10.5)

## 2018-12-17 LAB — HEMOGLOBIN A1C: Hgb A1c MFr Bld: 9.5 % — ABNORMAL HIGH (ref 4.6–6.5)

## 2018-12-17 LAB — TSH: TSH: 1.81 u[IU]/mL (ref 0.35–4.50)

## 2018-12-22 ENCOUNTER — Encounter: Payer: Self-pay | Admitting: Family Medicine

## 2018-12-22 ENCOUNTER — Other Ambulatory Visit: Payer: Self-pay

## 2018-12-22 ENCOUNTER — Ambulatory Visit (INDEPENDENT_AMBULATORY_CARE_PROVIDER_SITE_OTHER): Payer: Medicare Other | Admitting: Family Medicine

## 2018-12-22 VITALS — BP 162/72 | HR 66 | Temp 97.6°F | Ht 71.0 in | Wt 241.2 lb

## 2018-12-22 DIAGNOSIS — Z23 Encounter for immunization: Secondary | ICD-10-CM | POA: Diagnosis not present

## 2018-12-22 DIAGNOSIS — I10 Essential (primary) hypertension: Secondary | ICD-10-CM | POA: Diagnosis not present

## 2018-12-22 DIAGNOSIS — Z0000 Encounter for general adult medical examination without abnormal findings: Secondary | ICD-10-CM

## 2018-12-22 DIAGNOSIS — E785 Hyperlipidemia, unspecified: Secondary | ICD-10-CM | POA: Diagnosis not present

## 2018-12-22 DIAGNOSIS — Z7189 Other specified counseling: Secondary | ICD-10-CM

## 2018-12-22 DIAGNOSIS — E119 Type 2 diabetes mellitus without complications: Secondary | ICD-10-CM

## 2018-12-22 MED ORDER — GLIPIZIDE 5 MG PO TABS: ORAL_TABLET | ORAL | 3 refills | Status: DC

## 2018-12-22 NOTE — Patient Instructions (Addendum)
Check with your insurance to see if they will cover the shingrix shot. Recheck A1c at a visit in about 3 months.  You don't have to fast.   Increase glipizide in the meantime.   Get back on your diet.  Take care.  Glad to see you.  Pneumonia shot today.

## 2018-12-22 NOTE — Progress Notes (Signed)
This visit occurred during the SARS-CoV-2 public health emergency.  Safety protocols were in place, including screening questions prior to the visit, additional usage of staff PPE, and extensive cleaning of exam room while observing appropriate contact time as indicated for disinfecting solutions.   He had used neb tx prev but not in the last month.  Discussed.  No recent need.  Diabetes:  Using medications without difficulties: rare diarrhea, limited, he can tolerate it.   Hypoglycemic episodes: no Hyperglycemic episodes:no Feet problems: no Blood Sugars averaging: usually ~170-200s.  He has been off diet.   eye exam within last year: f/u pending.    Hypertension:    Using medication without problems or lightheadedness: yes Chest pain with exertion:no but some occ L shoulder pain at rest.  Going on for rarely, not daily.  Noted about once a month, for a few minutes.  No pain on shoulder ROM but he has a tendency to sit with his L arm behind his head in a recliner.   Edema:no Short of breath:no Higher salt load recently.    Elevated Cholesterol: Using medications without problems:yes Muscle aches: see above, not o/w.  Diet compliance: encouraged.   Exercise: encouraged.    Flu 2020 PNA 2020 Shingles d/w pt.   Tetanus 2015 Colonoscopy up to date.  Prostate cancer screening and PSA options (with potential risks and benefits of testing vs not testing) were discussed along with recent recs/guidelines.  He declined testing PSA at this point. Advance directive- wife designated if patient were incapacitated.   PMH and SH reviewed  Meds, vitals, and allergies reviewed.   ROS: Per HPI unless specifically indicated in ROS section   GEN: nad, alert and oriented HEENT: ncat NECK: supple w/o LA CV: rrr. PULM: ctab, no inc wob ABD: soft, +bs EXT: no edema SKIN: no acute rash  Diabetic foot exam: Normal inspection No skin breakdown No calluses  Normal DP pulses Normal sensation  to light touch and monofilament Nails normal

## 2018-12-23 DIAGNOSIS — E785 Hyperlipidemia, unspecified: Secondary | ICD-10-CM | POA: Insufficient documentation

## 2018-12-23 NOTE — Assessment & Plan Note (Signed)
No change in meds at this point.  He will cut back on salt and work more on his diet.  The left shoulder pain he was having seems to be musculoskeletal and positional and he will update me as needed.  He does not have exertional pain.

## 2018-12-23 NOTE — Assessment & Plan Note (Signed)
He has been off diet.   Labs discussed with patient.  He will get back on his diet.  Recheck periodically.  He will increase his glipizide in the meantime and update me as needed.  He agrees.

## 2018-12-23 NOTE — Assessment & Plan Note (Signed)
He will work on diet and exercise.  No change in meds at this point.  He agrees. >25 minutes spent in face to face time with patient, >50% spent in counselling or coordination of care

## 2018-12-23 NOTE — Assessment & Plan Note (Signed)
Flu 2020 PNA 2020 Shingles d/w pt.   Tetanus 2015 Colonoscopy up to date.  Prostate cancer screening and PSA options (with potential risks and benefits of testing vs not testing) were discussed along with recent recs/guidelines.  He declined testing PSA at this point. Advance directive- wife designated if patient were incapacitated.

## 2018-12-23 NOTE — Assessment & Plan Note (Signed)
Advance directive- wife designated if patient were incapacitated.  

## 2018-12-31 ENCOUNTER — Other Ambulatory Visit: Payer: Self-pay

## 2018-12-31 ENCOUNTER — Ambulatory Visit (INDEPENDENT_AMBULATORY_CARE_PROVIDER_SITE_OTHER): Payer: Medicare Other | Admitting: General Practice

## 2018-12-31 DIAGNOSIS — Z952 Presence of prosthetic heart valve: Secondary | ICD-10-CM

## 2018-12-31 DIAGNOSIS — Z7901 Long term (current) use of anticoagulants: Secondary | ICD-10-CM

## 2018-12-31 DIAGNOSIS — I4891 Unspecified atrial fibrillation: Secondary | ICD-10-CM

## 2018-12-31 LAB — POCT INR: INR: 2.5 (ref 2.0–3.0)

## 2018-12-31 NOTE — Patient Instructions (Signed)
Pre visit review using our clinic review tool, if applicable. No additional management support is needed unless otherwise documented below in the visit note.  Continue to take 4 mg daily except 2 mg on Monday and Fridays.  Recheck in 6 weeks.   

## 2019-01-01 NOTE — Progress Notes (Signed)
Agree. Thanks

## 2019-01-07 LAB — HM DIABETES EYE EXAM

## 2019-01-19 ENCOUNTER — Other Ambulatory Visit: Payer: Self-pay

## 2019-01-19 ENCOUNTER — Encounter: Payer: Self-pay | Admitting: Family Medicine

## 2019-01-19 ENCOUNTER — Ambulatory Visit (INDEPENDENT_AMBULATORY_CARE_PROVIDER_SITE_OTHER): Payer: Medicare Other | Admitting: Family Medicine

## 2019-01-19 VITALS — BP 126/70 | HR 65 | Temp 96.6°F | Ht 71.0 in | Wt 244.2 lb

## 2019-01-19 DIAGNOSIS — L989 Disorder of the skin and subcutaneous tissue, unspecified: Secondary | ICD-10-CM

## 2019-01-19 MED ORDER — TRIAMCINOLONE ACETONIDE 0.5 % EX CREA: 1.0000 "application " | TOPICAL_CREAM | Freq: Three times a day (TID) | CUTANEOUS | 1 refills | Status: DC

## 2019-01-19 NOTE — Progress Notes (Signed)
This visit occurred during the SARS-CoV-2 public health emergency.  Safety protocols were in place, including screening questions prior to the visit, additional usage of staff PPE, and extensive cleaning of exam room while observing appropriate contact time as indicated for disinfecting solutions.  Rash.  On R3rd and 5th finger, L 5th finger, on the extensor side.  He has h/o dry cracking skin in the wintertime.  It will get cracked and irritated.  Going on for a few weeks.  Itchy.  Not throbbing.  No FCNAVD.  Tried OTC ointment w/o relief.    Also with a "bumpy rash" on the R lateral proximal shin.  Not itchy.  Present for longer than the hand rash.    His sugar has been <200 in the meantime, and that is clearly better.  He has been working on diet, d/w pt.   38mm irritated warty lesion on the lower deltoid.  D/w pt about options.  Opted for freezing with liq N2.   Meds, vitals, and allergies reviewed.   ROS: Per HPI unless specifically indicated in ROS section   nad ncat Irritated well demarcated erythematous rash on the right third, right fifth, left fifth fingers, only on the extensor side.  No spreading erythema.  Distally neurovascularly intact. Faint blanching maculopapular rash on a patch of skin on the right lateral proximal shin.  No ulceration. 3 mm irritated warty lesion on the left lower deltoid.

## 2019-01-19 NOTE — Patient Instructions (Signed)
Use the cream for now.  If the rash doesn't resolve then let me know.  The other spot should blister and then heal.  Update me as needed.  Take care.  Glad to see you.

## 2019-01-20 NOTE — Assessment & Plan Note (Signed)
Discussed options.  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  Start triamcinolone cream on both and update me as needed.  He agrees.  No sign of infection or ominous diagnosis otherwise Discussed options about irritated warty lesion.  He opted for treatment with liquid nitrogen.  Frozen x3 without complication.  Tolerated well.  This should blister and he can cover with a Band-Aid as needed.  He will update me as needed.  He is aware we may have to retreated in the future.  It should heal over smoothly.

## 2019-01-26 ENCOUNTER — Encounter: Payer: Self-pay | Admitting: Family Medicine

## 2019-02-11 ENCOUNTER — Other Ambulatory Visit: Payer: Self-pay

## 2019-02-11 ENCOUNTER — Ambulatory Visit: Payer: Medicare PPO

## 2019-02-11 DIAGNOSIS — Z952 Presence of prosthetic heart valve: Secondary | ICD-10-CM

## 2019-02-11 DIAGNOSIS — I4891 Unspecified atrial fibrillation: Secondary | ICD-10-CM

## 2019-02-11 DIAGNOSIS — Z7901 Long term (current) use of anticoagulants: Secondary | ICD-10-CM

## 2019-02-11 LAB — POCT INR: INR: 2 (ref 2.0–3.0)

## 2019-02-11 NOTE — Progress Notes (Signed)
Agree. Thanks

## 2019-02-11 NOTE — Patient Instructions (Addendum)
Pre visit review using our clinic review tool, if applicable. No additional management support is needed unless otherwise documented below in the visit note.  Continue to take 4 mg daily except 2 mg on Monday and Fridays.  Recheck in 6 weeks.

## 2019-03-01 ENCOUNTER — Other Ambulatory Visit: Payer: Self-pay | Admitting: Family Medicine

## 2019-03-01 NOTE — Telephone Encounter (Deleted)
Electronic refill request. Potassium

## 2019-03-25 ENCOUNTER — Encounter: Payer: Self-pay | Admitting: Family Medicine

## 2019-03-25 ENCOUNTER — Ambulatory Visit (INDEPENDENT_AMBULATORY_CARE_PROVIDER_SITE_OTHER): Payer: Medicare PPO

## 2019-03-25 ENCOUNTER — Other Ambulatory Visit: Payer: Self-pay

## 2019-03-25 ENCOUNTER — Ambulatory Visit: Payer: Medicare PPO | Admitting: Family Medicine

## 2019-03-25 VITALS — BP 122/80 | HR 63 | Temp 96.0°F | Ht 71.0 in | Wt 239.6 lb

## 2019-03-25 DIAGNOSIS — E119 Type 2 diabetes mellitus without complications: Secondary | ICD-10-CM

## 2019-03-25 DIAGNOSIS — Z7901 Long term (current) use of anticoagulants: Secondary | ICD-10-CM

## 2019-03-25 DIAGNOSIS — Z952 Presence of prosthetic heart valve: Secondary | ICD-10-CM

## 2019-03-25 DIAGNOSIS — I4891 Unspecified atrial fibrillation: Secondary | ICD-10-CM

## 2019-03-25 LAB — POCT GLYCOSYLATED HEMOGLOBIN (HGB A1C): Hemoglobin A1C: 7.2 % — AB (ref 4.0–5.6)

## 2019-03-25 LAB — POCT INR: INR: 2 (ref 2.0–3.0)

## 2019-03-25 NOTE — Patient Instructions (Addendum)
Pre visit review using our clinic review tool, if applicable. No additional management support is needed unless otherwise documented below in the visit note.  Continue to take 4 mg daily except 2 mg on Monday and Fridays.  Recheck in 6 weeks.

## 2019-03-25 NOTE — Progress Notes (Addendum)
This visit occurred during the SARS-CoV-2 public health emergency.  Safety protocols were in place, including screening questions prior to the visit, additional usage of staff PPE, and extensive cleaning of exam room while observing appropriate contact time as indicated for disinfecting solutions.  Diabetes:  Using medications without difficulties: yes, has been on higher dose of glipizide.  Hypoglycemic episodes: no Hyperglycemic episodes:no Feet problems:no Blood Sugars averaging: 126 this AM.  Has improved in the last few months with diet improvement.  eye exam within last year: yes A1c done at OV.  Much improved to 7.2.   He is checking his sugar up to 4 times a day.  covid vaccine d/w pt.    No SOB.  Feeling well o/w.    Meds, vitals, and allergies reviewed.  ROS: Per HPI unless specifically indicated in ROS section   GEN: nad, alert and oriented HEENT: ncat NECK: supple w/o LA CV: rrr. PULM: ctab, no inc wob ABD: soft, +bs EXT: no edema SKIN: no acute rash

## 2019-03-25 NOTE — Patient Instructions (Addendum)
Thank you for your effort.  Update me as needed.  Don't change your meds for now but if you have lows then cut back on the glipizide, down to 1 pill a day.   Recheck in about 3 months with INR at the visit.  A1c at the visit like to day.  Take care.  Glad to see you.

## 2019-03-25 NOTE — Assessment & Plan Note (Signed)
No change in meds for now but if any lows then cut back on the glipizide, down to 1 pill a day.   Recheck in about 3 months with INR at the visit.  A1c at the visit. He agrees.

## 2019-03-28 NOTE — Progress Notes (Signed)
Agree. Thanks

## 2019-04-02 ENCOUNTER — Ambulatory Visit: Payer: Medicare PPO | Attending: Internal Medicine

## 2019-04-02 DIAGNOSIS — Z23 Encounter for immunization: Secondary | ICD-10-CM | POA: Insufficient documentation

## 2019-04-02 NOTE — Progress Notes (Signed)
   Covid-19 Vaccination Clinic  Name:  [name] [name]    MRN: [id number] DOB: Jul 06, 1950  04/02/2019  [name] was observed post Covid-19 immunization for 15 minutes without incident. He was provided with Vaccine Information Sheet and instruction to access the V-Safe system.   [name] was instructed to call 911 with any severe reactions post vaccine: [name] Difficulty breathing  . Swelling of face and throat  . A fast heartbeat  . A bad rash all over body  . Dizziness and weakness

## 2019-04-12 ENCOUNTER — Other Ambulatory Visit: Payer: Self-pay | Admitting: Family Medicine

## 2019-04-19 ENCOUNTER — Telehealth: Payer: Self-pay

## 2019-04-19 NOTE — Telephone Encounter (Signed)
Pt left v/m that pt was to cb with information about what DM meter and diabetic supplies his ins would approve. Pt left v/m that there were 2 meters and requested cb. I left v/m requesting pt to cb with info and can leave detailed information on this v/m.

## 2019-04-20 NOTE — Telephone Encounter (Signed)
I spoke with pt; pt just brought paperwork to [facility] and gave to [place] with all the information about getting the DM meter and testing supplies. [place] said placed info in Dr Josefine Class in [employer] on his desk. Pt request cb when ordered.

## 2019-04-21 NOTE — Telephone Encounter (Signed)
Lugene-I will make sure this note from the patient gets routed to you.  Please take a look at it and then talk with me about it when we are both back in clinic.  Thanks.

## 2019-04-23 ENCOUNTER — Ambulatory Visit: Payer: Medicare PPO | Attending: Internal Medicine

## 2019-04-23 DIAGNOSIS — Z23 Encounter for immunization: Secondary | ICD-10-CM

## 2019-04-23 NOTE — Progress Notes (Signed)
   Covid-19 Vaccination Clinic  Name:  ERRON MANDALA    MRN: [id number] DOB: Jan 31, 1950  04/23/2019  Mr. Blackmond was observed post Covid-19 immunization for 15 minutes without incident. He was provided with Vaccine Information Sheet and instruction to access the V-Safe system.   Mr. Lafauci was instructed to call 911 with any severe reactions post vaccine: [name] Difficulty breathing  . Swelling of face and throat  . A fast heartbeat  . A bad rash all over body  . Dizziness and weakness   Immunizations Administered    Name Date Dose VIS Date Route   Pfizer COVID-19 Vaccine 04/23/2019  9:52 AM 0.3 mL 01/08/2019 Intramuscular   Manufacturer: [place]   Lot: [id number]   [place]: [id number]

## 2019-04-26 NOTE — Telephone Encounter (Signed)
Pt came in to check status on this. He is wanting one that connects to his phone. He would prefer the Dexcom G6 if possible.

## 2019-05-04 NOTE — Telephone Encounter (Signed)
Called the company for the [place] and was advised that the patient has to initiate the process and give the required information and after that, the MD will be contacted for whatever information is required by them.

## 2019-05-06 ENCOUNTER — Ambulatory Visit: Payer: Medicare PPO

## 2019-05-11 ENCOUNTER — Other Ambulatory Visit: Payer: Self-pay

## 2019-05-11 ENCOUNTER — Ambulatory Visit: Payer: Medicare PPO

## 2019-05-11 DIAGNOSIS — I4891 Unspecified atrial fibrillation: Secondary | ICD-10-CM

## 2019-05-11 DIAGNOSIS — Z7901 Long term (current) use of anticoagulants: Secondary | ICD-10-CM

## 2019-05-11 DIAGNOSIS — Z952 Presence of prosthetic heart valve: Secondary | ICD-10-CM

## 2019-05-11 LAB — POCT INR: INR: 2 (ref 2.0–3.0)

## 2019-05-11 NOTE — Patient Instructions (Addendum)
Pre visit review using our clinic review tool, if applicable. No additional management support is needed unless otherwise documented below in the visit note.  Continue to take 4 mg daily except 2 mg on Monday and Fridays.  Recheck in 6 weeks.   

## 2019-05-12 NOTE — Progress Notes (Signed)
Agree. Thanks

## 2019-05-13 ENCOUNTER — Other Ambulatory Visit: Payer: Self-pay | Admitting: Family Medicine

## 2019-05-13 NOTE — Telephone Encounter (Signed)
I sent a note to Debbora Dus concerning whether this patient might qualify for one of the machines that she has on hand, awaiting response.

## 2019-05-14 NOTE — Telephone Encounter (Signed)
Please let me know how I can help. Thanks

## 2019-05-14 NOTE — Telephone Encounter (Signed)
A fax has now arrived from the company to fill out.  We will see how this goes as to whether or not his insurance will cover it.  If not, Larene Beach, RN may have a plan that she will speak with you about.

## 2019-05-20 NOTE — Telephone Encounter (Signed)
Sharee Pimple with [employer] left v/m requesting status of fax for dexcom diabetic meter that was faxed to [facility] on 05/12/19 and 05/17/19 for written order and clinical notes with how often pt test BS and what is injection frequency. Sharee Pimple said if has form filled out please fax to [phone] otherwise request cb.

## 2019-05-20 NOTE — Telephone Encounter (Signed)
Form faxed

## 2019-05-21 NOTE — Telephone Encounter (Signed)
Pt said received the order for diabetic meter but did not receive any clinical notes. Please fax clinical notes to [phone].

## 2019-05-25 NOTE — Telephone Encounter (Signed)
Request is for clinical notes with how often pt test BS and what is injection frequency.  I can certainly fax the notes but I'm not sure if the notes include this information.  Please advise.

## 2019-05-26 ENCOUNTER — Telehealth: Payer: Self-pay | Admitting: Family Medicine

## 2019-05-26 NOTE — Telephone Encounter (Signed)
[facility] calling requesting an update on faxed request for clinical notes to determine coverage for Dexcom  This was faxed 4/19 and 4/22  They are needing this faxed back asap.   Aware that Dr Damita Dunnings will be back in office next week - there is no deadline in receiving notes back, just want to ensure that the patient gets his machine.   Please advise, thanks.

## 2019-05-27 NOTE — Telephone Encounter (Signed)
Patient states he is testing 4 times a day which is surprising that his insurance would cover strips for that many times a day.  They usually will only pay for once a day unless the patient is on insulin.

## 2019-05-27 NOTE — Telephone Encounter (Addendum)
Form refaxed along with last OV note containing the number of times per day that patient is checking his blood sugar.

## 2019-05-27 NOTE — Telephone Encounter (Signed)
This message has been sent to Dr. Damita Dunnings previously and will be handled when he is back in the office next week.  At this point, I am not sure that the previous notes contain the information that is requested.

## 2019-05-27 NOTE — Telephone Encounter (Signed)
We do not have injection data since he is only taking oral medications.  If he has recent blood sugar readings we can add that to his previous notes and then send that.  Please see what details you can get from the patient and I can addend his previous note with the update.  Thanks.

## 2019-05-27 NOTE — Telephone Encounter (Signed)
I added that information to the previous note.  Please send that in and see if he can get approved.  Thanks.

## 2019-05-31 ENCOUNTER — Other Ambulatory Visit: Payer: Self-pay | Admitting: Family Medicine

## 2019-06-04 DIAGNOSIS — E119 Type 2 diabetes mellitus without complications: Secondary | ICD-10-CM | POA: Diagnosis not present

## 2019-06-17 ENCOUNTER — Other Ambulatory Visit: Payer: Self-pay

## 2019-06-17 ENCOUNTER — Ambulatory Visit: Payer: Medicare PPO

## 2019-06-17 DIAGNOSIS — Z7901 Long term (current) use of anticoagulants: Secondary | ICD-10-CM | POA: Diagnosis not present

## 2019-06-17 LAB — POCT INR: INR: 2.8 (ref 2.0–3.0)

## 2019-06-17 NOTE — Patient Instructions (Addendum)
Pre visit review using our clinic review tool, if applicable. No additional management support is needed unless otherwise documented below in the visit note.  Continue to take 4 mg daily except 2 mg on Monday and Fridays.  Recheck in 6 weeks.

## 2019-06-24 ENCOUNTER — Ambulatory Visit: Payer: Medicare PPO

## 2019-07-08 ENCOUNTER — Other Ambulatory Visit: Payer: Self-pay | Admitting: Family Medicine

## 2019-07-14 ENCOUNTER — Other Ambulatory Visit: Payer: Self-pay | Admitting: Family Medicine

## 2019-07-14 DIAGNOSIS — Z7901 Long term (current) use of anticoagulants: Secondary | ICD-10-CM

## 2019-07-21 ENCOUNTER — Other Ambulatory Visit: Payer: Self-pay | Admitting: Cardiology

## 2019-07-21 MED ORDER — CARVEDILOL 6.25 MG PO TABS: 6.2500 mg | ORAL_TABLET | Freq: Two times a day (BID) | ORAL | 0 refills | Status: DC

## 2019-07-21 NOTE — Telephone Encounter (Signed)
*  STAT* If patient is at the pharmacy, call can be transferred to refill team.   1. Which medications need to be refilled? (please list name of each medication and dose if known) carvedilol (COREG) 6.25 MG tablet  2. Which pharmacy/location (including street and city if local pharmacy) is medication to be sent to? [place], [place]  3. Do they need a 30 day or 90 day supply? 90 day

## 2019-07-23 NOTE — Telephone Encounter (Signed)
Pt compliant with coumadin management Sent in scipts

## 2019-07-27 ENCOUNTER — Ambulatory Visit (INDEPENDENT_AMBULATORY_CARE_PROVIDER_SITE_OTHER): Payer: Medicare PPO

## 2019-07-27 ENCOUNTER — Other Ambulatory Visit: Payer: Self-pay

## 2019-07-27 DIAGNOSIS — Z952 Presence of prosthetic heart valve: Secondary | ICD-10-CM | POA: Diagnosis not present

## 2019-07-27 DIAGNOSIS — I4891 Unspecified atrial fibrillation: Secondary | ICD-10-CM

## 2019-07-27 DIAGNOSIS — Z7901 Long term (current) use of anticoagulants: Secondary | ICD-10-CM

## 2019-07-27 LAB — POCT INR: INR: 1.9 — AB (ref 2.0–3.0)

## 2019-07-27 NOTE — Patient Instructions (Addendum)
Pre visit review using our clinic review tool, if applicable. No additional management support is needed unless otherwise documented below in the visit note.  Increase dose today to 6mg  and then continue to take 4 mg daily except 2 mg on Monday and Fridays.  Recheck in 4 weeks.

## 2019-07-28 NOTE — Progress Notes (Signed)
Agree.  Thanks.  Can he have a routine DM2 f/u OV with A1c at next INR visit?  Can he get on my schedule for that?

## 2019-07-29 ENCOUNTER — Ambulatory Visit: Payer: Medicare PPO

## 2019-08-04 ENCOUNTER — Telehealth: Payer: Self-pay

## 2019-08-04 NOTE — Telephone Encounter (Addendum)
-----   Message from Tonia Ghent, MD sent at 07/28/2019 11:01 AM EDT ----- Agree.  Thanks.  Can he have a routine DM2 f/u OV with A1c at next INR visit?  Can he get on my schedule for that? ----- Message ----- From: Randall An, RN Sent: 07/27/2019   3:17 PM EDT To: Tonia Ghent, MD   LVM for pt to return call to make apt with Dr. Damita Dunnings on 7/27 around his coumadin clinic apt which is at 330. Any time around that time is fine.

## 2019-08-09 NOTE — Telephone Encounter (Signed)
Pt left VM that he could come to any apt scheduled.  Pt scheduled for 400 with Dr. Damita Dunnings on 7/27 and his coumadin clinic apt is for the same day at 330.

## 2019-08-10 NOTE — Telephone Encounter (Signed)
Pt advised of apts and agreed to both apts.

## 2019-08-24 ENCOUNTER — Other Ambulatory Visit: Payer: Self-pay

## 2019-08-24 ENCOUNTER — Ambulatory Visit: Payer: Medicare PPO | Admitting: Family Medicine

## 2019-08-24 ENCOUNTER — Ambulatory Visit: Payer: Medicare PPO

## 2019-08-24 ENCOUNTER — Encounter: Payer: Self-pay | Admitting: Family Medicine

## 2019-08-24 ENCOUNTER — Ambulatory Visit (INDEPENDENT_AMBULATORY_CARE_PROVIDER_SITE_OTHER): Payer: Medicare PPO | Admitting: Family Medicine

## 2019-08-24 VITALS — BP 160/70 | HR 61 | Temp 96.9°F | Ht 71.0 in | Wt 236.4 lb

## 2019-08-24 DIAGNOSIS — Z952 Presence of prosthetic heart valve: Secondary | ICD-10-CM

## 2019-08-24 DIAGNOSIS — I4891 Unspecified atrial fibrillation: Secondary | ICD-10-CM

## 2019-08-24 DIAGNOSIS — Z7901 Long term (current) use of anticoagulants: Secondary | ICD-10-CM | POA: Diagnosis not present

## 2019-08-24 DIAGNOSIS — E119 Type 2 diabetes mellitus without complications: Secondary | ICD-10-CM | POA: Diagnosis not present

## 2019-08-24 LAB — POCT GLYCOSYLATED HEMOGLOBIN (HGB A1C): Hemoglobin A1C: 6.7 % — AB (ref 4.0–5.6)

## 2019-08-24 LAB — POCT INR: INR: 2.1 (ref 2.0–3.0)

## 2019-08-24 MED ORDER — METFORMIN HCL 500 MG PO TABS: ORAL_TABLET | ORAL | Status: DC

## 2019-08-24 NOTE — Progress Notes (Signed)
This visit occurred during the SARS-CoV-2 public health emergency.  Safety protocols were in place, including screening questions prior to the visit, additional usage of staff PPE, and extensive cleaning of exam room while observing appropriate contact time as indicated for disinfecting solutions.  Diabetes:  Using medications without difficulties: yes, see below.   Hypoglycemic episodes: down to 58 on meter, last week.  Hyperglycemic episodes: max 150 in the AM, usually lower Feet problems: no Blood Sugars averaging: see above.   eye exam within last year: yes A1c 6.7.  He is using dexcom and it helps him monitor his sugars.   He is cutting back on metformin.  See avs.    He had some R arm numbness that is getting better after prev 2nd covid vaccine was done in R arm.  No group changes.  Meds, vitals, and allergies reviewed.   ROS: Per HPI unless specifically indicated in ROS section   GEN: nad, alert and oriented HEENT: ncat NECK: supple w/o LA CV: rrr.  Click at baseline.   PULM: ctab, no inc wob ABD: soft, +bs EXT: no edema SKIN: no acute rash  R lateral 1st nail with small nontender subungual hematoma.

## 2019-08-24 NOTE — Patient Instructions (Addendum)
Cut metformin back to 1 pill twice a day.  If any low sugars, then cut back to 1 pill in the AM.    Update me as needed, especially if your BP stays elevated (>140/>90).  Recheck in about 4 months at a yearly visit with labs ahead of time.  Take care.  Glad to see you.

## 2019-08-24 NOTE — Patient Instructions (Addendum)
Pre visit review using our clinic review tool, if applicable. No additional management support is needed unless otherwise documented below in the visit note.  Continue to take 4 mg daily except 2 mg on Monday and Fridays.  Recheck in 5 weeks.   

## 2019-08-24 NOTE — Progress Notes (Signed)
Agree. Thanks

## 2019-08-25 NOTE — Assessment & Plan Note (Signed)
A1c 6.7.  He is using dexcom and it helps him monitor his sugars.   Cut metformin back to 1 pill twice a day.  If any low sugars, then cut back to 1 pill in the AM.   Update me as needed, especially if your BP stays elevated (>140/>90). Recheck in about 4 months at a yearly visit with labs ahead of time.

## 2019-08-27 ENCOUNTER — Other Ambulatory Visit: Payer: Self-pay | Admitting: Family Medicine

## 2019-08-28 DIAGNOSIS — E119 Type 2 diabetes mellitus without complications: Secondary | ICD-10-CM | POA: Diagnosis not present

## 2019-08-30 DIAGNOSIS — I420 Dilated cardiomyopathy: Secondary | ICD-10-CM | POA: Insufficient documentation

## 2019-08-30 DIAGNOSIS — I428 Other cardiomyopathies: Secondary | ICD-10-CM | POA: Insufficient documentation

## 2019-08-30 NOTE — Progress Notes (Signed)
Cardiology Office Note   Date:  08/31/2019   ID:  [name] [name], DOB September 14, 1950, MRN [id number]  PCP:  Tonia Ghent, MD  Cardiologist:   No primary care provider on file.   Chief Complaint  Patient presents with  . AVR      History of Present Illness: [name] [name] is a 69 y.o. male who presents for follow up after aortic valve replacement.  Since I last saw him he has done well. The patient denies any new symptoms such as chest discomfort, neck or arm discomfort. There has been no new shortness of breath, PND or orthopnea. There have been no reported palpitations, presyncope or syncope.  He walks with his dog and walks for exercise and does stuff around the house.  With this he has no new symptoms.    Past Medical History:  Diagnosis Date  . Anemia   . Aortic insufficiency    with bicuspid aortic valve (the last MRI demonstrated the aortic root to be 4.8 x 4.7 cm.) Mod regurgitation. Normal chamber size with very mild left ventricular dysfunction.)  . Blood transfusion   . CHF (congestive heart failure) ([place])   . Cough 06/28/2009   Dr. Melvyn Novas  . Diabetes mellitus without complication ([place])   . Duodenal ulcer    with GI bleeding  . Dyslipidemia   . GERD (gastroesophageal reflux disease) 03/23/2001   egd w/ dilation...Marland KitchenMarland KitchenDeatra Ina  . Hemorrhoids   . Hyperlipidemia   . Hypertension   . 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 Orthostasis 08/2009   Fever and syncope, no source for fever on cultures.  . Pericardial effusion 07/2009   admission to [facility], s/p pericardial window  . Pleural effusion 10/2009   s/p thoracentesis  . PNA (pneumonia) 10/2009   admnission to [place], with right pleural effusion, s/p thoracentesis  . Syncope 08/2009   admission for fever and syncope, no source for fever seen on cultures. Syncope thought to be r/t orthostasis    Past Surgical History:  Procedure Laterality Date  . CARDIAC  VALVE REPLACEMENT  07/10/2009   AVR  . Cardiolite EKG  03/10/2003   (-) ? small/apical infarct EF 50%  . DOPPLER ECHOCARDIOGRAPHY  01/18/97   LVH, mild dilated aorta - root mod to severe aortic regurg EF 50-55%  . DOPPLER ECHOCARDIOGRAPHY  09/27/03   No changes, moderate severe aortic regurg  . DOPPLER ECHOCARDIOGRAPHY  07/09/2004   Mod severe aortic regurg 2-3+ T.R., T.I.R., mild P..R.  . DOPPLER ECHOCARDIOGRAPHY  04/01/06   Hypokin Apex EF 55%  . DOPPLER ECHOCARDIOGRAPHY  02/15/2010   Mild LVH, mild decr Sys fctn EF 40-45% Mild Dias Dysfctn Mech AV Triv AR, MR  . ESOPHAGOGASTRODUODENOSCOPY  1990, 1999   Hot dog in throat (1987)/ Chicken in throat (1990)/ 01/06/97 (Dr. Earlean Shawl) Impaction, dilatation, Schatzki's Ring/ 07/1998 repeat Dilatation/ 01/10/01 stricture/dilated/ 03/08/05 Schatzki's Ring, dilated/ 06/01/08 Food Disimpaction [name] Bulb Ulcer (Dr. Watt Climes)  . ESOPHAGOGASTRODUODENOSCOPY  01/06/1997   (Dr. Earlean Shawl) Impaction, dilatation, Schatzki's Ring  . ESOPHAGOGASTRODUODENOSCOPY  08/09/98   Repeat dilatation Deatra Ina)  . ESOPHAGOGASTRODUODENOSCOPY  01/13/2001   stricture/ HH, duodenal ulcer / bleeding Henrene Pastor)  . ESOPHAGOGASTRODUODENOSCOPY  12/25/01   esophagus stricture, dilated  . ESOPHAGOGASTRODUODENOSCOPY  03/23/2001   stricture, dilated  . ESOPHAGOGASTRODUODENOSCOPY  03/08/05   Schatzki's Ring   03/13/05  Schatzki's Ring - dilated, duodenitis  . ESOPHAGOGASTRODUODENOSCOPY  06/11/08   with food disimpaction.  Food impaction [place]  Sm bulb ulcer (Dr. Watt Climes)  . ESOPHAGOGASTRODUODENOSCOPY     stricture/HH, duodenal ulcer/bleeding Henrene Pastor)  . ESOPHAGOGASTRODUODENOSCOPY N/A 01/23/2016   Procedure: ESOPHAGOGASTRODUODENOSCOPY (EGD);  Surgeon: [name], MD;  Location: [facility] ENDOSCOPY;  Service: Endoscopy;  Laterality: N/A;  . ESOPHAGOGASTRODUODENOSCOPY (EGD) WITH PROPOFOL N/A 02/06/2016   Procedure: ESOPHAGOGASTRODUODENOSCOPY (EGD) WITH PROPOFOL;  Surgeon: [name], MD;  Location: [facility]  ENDOSCOPY;  Service: Endoscopy;  Laterality: N/A;  . HERNIA REPAIR  2011   abdominal; post AVR  . MRI  2/05   LVH global hypokin EF 48% Mod A.I.  . VALVE REPLACEMENT  06/2009   St. Jude mechanical valve per Dr. Cyndia Bent     Current Outpatient Medications  Medication Sig Dispense Refill  . ACCU-CHEK AVIVA PLUS test strip USE AS DIRECTED TO CHECK BLOOD SUGAR 2 TO 4 TIMES A DAY 100 each 11  . Accu-Chek FastClix Lancets MISC USE AS DIRECTED TO CHECK BLOOD SUGAR TWICE DAILY 102 each 4  . amoxicillin (AMOXIL) 500 MG tablet Take 2,000 mg by mouth once. Before dental visits.    [name] azelastine (ASTELIN) 0.1 % nasal spray Place 2 sprays into both nostrils 2 (two) times daily as needed for rhinitis. Use in each nostril as directed    . Blood Glucose Monitoring Suppl (ACCU-CHEK AVIVA PLUS) W/DEVICE KIT 1 kit by Does not apply route once. 1 kit 0  . carvedilol (COREG) 6.25 MG tablet Take 1 tablet (6.25 mg total) by mouth 2 (two) times daily. KEEP OV. 180 tablet 0  . Continuous Blood Gluc Receiver ([place]) DEVI by Does not apply route.    . diphenhydrAMINE (BENADRYL) 25 MG tablet Take 25 mg by mouth every 6 (six) hours as needed for itching.     . furosemide (LASIX) 20 MG tablet TAKE 1 TABLET BY MOUTH ONCE DAILY 90 tablet 2  . Lancets Misc. (ACCU-CHEK FASTCLIX LANCET) KIT AS DIRECTED 1 kit 0  . losartan (COZAAR) 100 MG tablet Take 1 tablet (100 mg total) by mouth daily. 90 tablet 3  . metFORMIN (GLUCOPHAGE) 500 MG tablet TAKE 1 TABLET BY MOUTH TWICE A DAY    . potassium chloride (KLOR-CON) 10 MEQ tablet TAKE 1 TABLET BY MOUTH TWICE A DAY 180 tablet 3  . pravastatin (PRAVACHOL) 20 MG tablet TAKE 1 TABLET BY MOUTH ONCE DAILY 90 tablet 3  . psyllium (METAMUCIL) 58.6 % packet Take 1 packet by mouth daily.    [name] triamcinolone cream (KENALOG) 0.5 % Apply 1 application topically 3 (three) times daily. 30 g 1  . warfarin (COUMADIN) 1 MG tablet TAKE AS DIRECTED BY ANTICOAGULATION CLINIC 120 tablet 1  .  warfarin (COUMADIN) 3 MG tablet TAKE AS DIRECTED BY COUMADIN CLINIC 70 tablet 1   No current facility-administered medications for this visit.    Allergies:   Ace inhibitors, Aspirin, and Primaxin [imipenem w/cilastatin sodium]     ROS:  Please see the history of present illness.   Otherwise, review of systems are positive for none.   All other systems are reviewed and negative.    PHYSICAL EXAM: VS:  BP (!) 146/76   Pulse 60   Ht '5\' 11"'$  (1.803 m)   Wt 237 lb 3.2 oz (107.6 kg)   SpO2 98%   BMI 33.08 kg/m  , BMI Body mass index is 33.08 kg/m. GENERAL:  Well appearing HEENT:  Pupils equal round and reactive, fundi not visualized, oral mucosa unremarkable NECK:  No jugular venous distention, waveform within  normal limits, carotid upstroke brisk and symmetric, no bruits, no thyromegaly LYMPHATICS:  No cervical, inguinal adenopathy LUNGS:  Clear to auscultation bilaterally BACK:  No CVA tenderness CHEST:  Unremarkable HEART:  PMI not displaced or sustained,S1 and mechanical S2 within normal limits, no S3, no S4, no clicks, no rubs,  murmurs ABD:  Flat, positive bowel sounds normal in frequency in pitch, no bruits, no rebound, no guarding, no midline pulsatile mass, no hepatomegaly, no splenomegaly EXT:  2 plus pulses throughout, no edema, no cyanosis no clubbing SKIN:  No rashes no nodules NEURO:  Cranial nerves II through XII grossly intact, motor grossly intact throughout PSYCH:  Cognitively intact, oriented to person place and time    EKG:  EKG is ordered today. The ekg ordered today demonstrates left bundle branch block, left axis deviation, sinus rhythm, rate 60, no change from previous.   Recent Labs: 12/17/2018: ALT 33; BUN 11; Creatinine, Ser 0.95; Hemoglobin 15.2; Platelets 163.0; Potassium 4.5; Sodium 138; TSH 1.81    Lipid Panel    Component Value Date/Time   CHOL 165 12/17/2018 0836   TRIG 134.0 12/17/2018 0836   HDL 44.50 12/17/2018 0836   CHOLHDL 4  12/17/2018 0836   VLDL 26.8 12/17/2018 0836   LDLCALC 94 12/17/2018 0836   LDLDIRECT 122.0 12/02/2016 1318      Wt Readings from Last 3 Encounters:  08/31/19 237 lb 3.2 oz (107.6 kg)  08/24/19 (!) 236 lb 6 oz (107.2 kg)  03/25/19 239 lb 9 oz (108.7 kg)      Other studies Reviewed: Additional studies/ records that were reviewed today include: Labs. Review of the above records demonstrates:  Please see elsewhere in the note.     ASSESSMENT AND PLAN:  AVR:  Has had no new symptoms.  He has a stable exam.  He tolerates anticoagulation.  He understands endocarditis prophylaxis.  No change in therapy or further imaging at this time.   CARDIOMYOPATHY:    He had a mildly reduced but stable ejection fraction over time.  No change in therapy.  He seems to be euvolemic.  OBESITY:  He has lost a few pounds.  He understands need for weight loss with diet and exercise.  ABNORMAL EKG:  EKG is unchanged from previous.  He has no symptomatic bradycardia arrhythmias.  HTN: The blood pressure is elevated and has been fairly consistently.  I am going to increase his Cozaar to 100 mg daily.   DM: His A1c is was 8.4 but now is down to 6.7.  No change in therapy.   COVID EDUCATION: He has been vaccinated.  Current medicines are reviewed at length with the patient today.  The patient does not have concerns regarding medicines.  The following changes have been made: As above.  Labs/ tests ordered today include:   Orders Placed This Encounter  Procedures  . EKG 12-Lead     Disposition:   FU with me in one year.     Signed, Minus Breeding, MD  08/31/2019 5:19 PM    [facility]

## 2019-08-31 ENCOUNTER — Other Ambulatory Visit: Payer: Self-pay

## 2019-08-31 ENCOUNTER — Encounter: Payer: Self-pay | Admitting: Cardiology

## 2019-08-31 ENCOUNTER — Ambulatory Visit (INDEPENDENT_AMBULATORY_CARE_PROVIDER_SITE_OTHER): Payer: Medicare PPO | Admitting: Cardiology

## 2019-08-31 VITALS — BP 146/76 | HR 60 | Ht 71.0 in | Wt 237.2 lb

## 2019-08-31 DIAGNOSIS — I10 Essential (primary) hypertension: Secondary | ICD-10-CM

## 2019-08-31 DIAGNOSIS — Z952 Presence of prosthetic heart valve: Secondary | ICD-10-CM

## 2019-08-31 DIAGNOSIS — I420 Dilated cardiomyopathy: Secondary | ICD-10-CM | POA: Diagnosis not present

## 2019-08-31 DIAGNOSIS — Z7189 Other specified counseling: Secondary | ICD-10-CM

## 2019-08-31 MED ORDER — LOSARTAN POTASSIUM 100 MG PO TABS: 100.0000 mg | ORAL_TABLET | Freq: Every day | ORAL | 3 refills | Status: DC

## 2019-08-31 NOTE — Patient Instructions (Signed)
Medication Instructions:  INCREASE YOUR COZAAR TO 100MG  DAILY *If you need a refill on your cardiac medications before your next appointment, please call your pharmacy*  Follow-Up: At [facility], you and your health needs are our priority.  As part of our continuing mission to provide you with exceptional heart care, we have created designated Provider Care Teams.  These Care Teams include your primary Cardiologist (physician) and Advanced Practice Providers (APPs -  Physician Assistants and Nurse Practitioners) who all work together to provide you with the care you need, when you need it.  We recommend signing up for the patient portal called "MyChart".  Sign up information is provided on this After Visit Summary.  MyChart is used to connect with patients for Virtual Visits (Telemedicine).  Patients are able to view lab/test results, encounter notes, upcoming appointments, etc.  Non-urgent messages can be sent to your provider as well.   To learn more about what you can do with MyChart, go to [email or web address].    Your next appointment:   12 month(s)  The format for your next appointment:   In Person  Provider:   Minus Breeding, MD

## 2019-09-08 ENCOUNTER — Other Ambulatory Visit: Payer: Self-pay | Admitting: Family Medicine

## 2019-09-27 ENCOUNTER — Telehealth: Payer: Self-pay | Admitting: Family Medicine

## 2019-09-27 NOTE — Telephone Encounter (Signed)
Contacted pt and RS apt to 430 due to working with a provider in the AM. Pt agreed to new apt time.

## 2019-09-27 NOTE — Telephone Encounter (Signed)
Spouse called to see if pt could come in for PT/INR before 1 tomorrow they have appointment in [place] @ 1:30 afraid they will be late for 3:30 Can he come in am

## 2019-09-28 ENCOUNTER — Ambulatory Visit: Payer: Medicare PPO

## 2019-09-28 ENCOUNTER — Other Ambulatory Visit: Payer: Self-pay

## 2019-09-28 DIAGNOSIS — Z7901 Long term (current) use of anticoagulants: Secondary | ICD-10-CM

## 2019-09-28 DIAGNOSIS — I4891 Unspecified atrial fibrillation: Secondary | ICD-10-CM

## 2019-09-28 DIAGNOSIS — Z952 Presence of prosthetic heart valve: Secondary | ICD-10-CM

## 2019-09-28 LAB — POCT INR: INR: 2.9 (ref 2.0–3.0)

## 2019-09-28 NOTE — Patient Instructions (Addendum)
Pre visit review using our clinic review tool, if applicable. No additional management support is needed unless otherwise documented below in the visit note.  Continue to take 4 mg daily except 2 mg on Monday and Fridays.  Recheck in 5 weeks.

## 2019-09-29 NOTE — Progress Notes (Signed)
Agree. Thanks

## 2019-10-25 ENCOUNTER — Other Ambulatory Visit: Payer: Self-pay | Admitting: Cardiology

## 2019-11-04 ENCOUNTER — Other Ambulatory Visit: Payer: Self-pay

## 2019-11-04 ENCOUNTER — Ambulatory Visit: Payer: Medicare PPO

## 2019-11-04 DIAGNOSIS — Z952 Presence of prosthetic heart valve: Secondary | ICD-10-CM

## 2019-11-04 DIAGNOSIS — Z7901 Long term (current) use of anticoagulants: Secondary | ICD-10-CM

## 2019-11-04 DIAGNOSIS — I4891 Unspecified atrial fibrillation: Secondary | ICD-10-CM

## 2019-11-04 LAB — POCT INR: INR: 1.9 — AB (ref 2.0–3.0)

## 2019-11-04 NOTE — Patient Instructions (Addendum)
Pre visit review using our clinic review tool, if applicable. No additional management support is needed unless otherwise documented below in the visit note.  Take 6mg  today then continue to take 4 mg daily except 2 mg on Monday and Fridays.  Recheck in 6 weeks.

## 2019-11-07 NOTE — Progress Notes (Signed)
Agree. Thanks

## 2019-11-16 DIAGNOSIS — E119 Type 2 diabetes mellitus without complications: Secondary | ICD-10-CM | POA: Diagnosis not present

## 2019-11-24 ENCOUNTER — Other Ambulatory Visit: Payer: Self-pay | Admitting: Family Medicine

## 2019-11-30 ENCOUNTER — Other Ambulatory Visit: Payer: Self-pay | Admitting: Family Medicine

## 2019-12-12 ENCOUNTER — Other Ambulatory Visit: Payer: Self-pay | Admitting: Family Medicine

## 2019-12-12 DIAGNOSIS — E119 Type 2 diabetes mellitus without complications: Secondary | ICD-10-CM

## 2019-12-14 ENCOUNTER — Telehealth: Payer: Self-pay | Admitting: Family Medicine

## 2019-12-14 NOTE — Telephone Encounter (Signed)
Pt called needs to r/s his 11/18 pt/inr appointment going out of town

## 2019-12-14 NOTE — Telephone Encounter (Signed)
Contacted pt and RS.

## 2019-12-16 ENCOUNTER — Ambulatory Visit: Payer: Medicare PPO

## 2019-12-20 ENCOUNTER — Other Ambulatory Visit: Payer: Medicare PPO

## 2019-12-21 ENCOUNTER — Other Ambulatory Visit (INDEPENDENT_AMBULATORY_CARE_PROVIDER_SITE_OTHER): Payer: Medicare PPO

## 2019-12-21 ENCOUNTER — Ambulatory Visit (INDEPENDENT_AMBULATORY_CARE_PROVIDER_SITE_OTHER): Payer: Medicare PPO

## 2019-12-21 ENCOUNTER — Other Ambulatory Visit: Payer: Self-pay

## 2019-12-21 DIAGNOSIS — E119 Type 2 diabetes mellitus without complications: Secondary | ICD-10-CM | POA: Diagnosis not present

## 2019-12-21 DIAGNOSIS — Z7901 Long term (current) use of anticoagulants: Secondary | ICD-10-CM

## 2019-12-21 LAB — POCT INR: INR: 2.5 (ref 2.0–3.0)

## 2019-12-21 NOTE — Patient Instructions (Addendum)
Pre visit review using our clinic review tool, if applicable. No additional management support is needed unless otherwise documented below in the visit note.  Continue to take 4 mg daily except 2 mg on Monday and Fridays.  Recheck in 6 weeks.   

## 2019-12-22 LAB — TSH: TSH: 2.05 u[IU]/mL (ref 0.35–4.50)

## 2019-12-22 LAB — CBC WITH DIFFERENTIAL/PLATELET
Basophils Absolute: 0.1 10*3/uL (ref 0.0–0.1)
Basophils Relative: 1 % (ref 0.0–3.0)
Eosinophils Absolute: 0.2 10*3/uL (ref 0.0–0.7)
Eosinophils Relative: 3.9 % (ref 0.0–5.0)
HCT: 42.6 % (ref 39.0–52.0)
Hemoglobin: 14.8 g/dL (ref 13.0–17.0)
Lymphocytes Relative: 39.8 % (ref 12.0–46.0)
Lymphs Abs: 2.2 10*3/uL (ref 0.7–4.0)
MCHC: 34.8 g/dL (ref 30.0–36.0)
MCV: 91.3 fl (ref 78.0–100.0)
Monocytes Absolute: 0.7 10*3/uL (ref 0.1–1.0)
Monocytes Relative: 11.7 % (ref 3.0–12.0)
Neutro Abs: 2.5 10*3/uL (ref 1.4–7.7)
Neutrophils Relative %: 43.6 % (ref 43.0–77.0)
Platelets: 161 10*3/uL (ref 150.0–400.0)
RBC: 4.66 Mil/uL (ref 4.22–5.81)
RDW: 14 % (ref 11.5–15.5)
WBC: 5.6 10*3/uL (ref 4.0–10.5)

## 2019-12-22 LAB — LIPID PANEL
Cholesterol: 157 mg/dL (ref 0–200)
HDL: 46.3 mg/dL (ref >39.00)
NonHDL: 110.8
Total CHOL/HDL Ratio: 3
Triglycerides: 205 mg/dL — ABNORMAL HIGH (ref 0.0–149.0)
VLDL: 41 mg/dL — ABNORMAL HIGH (ref 0.0–40.0)

## 2019-12-22 LAB — COMPREHENSIVE METABOLIC PANEL
ALT: 26 U/L (ref 0–53)
AST: 31 U/L (ref 0–37)
Albumin: 4.5 g/dL (ref 3.5–5.2)
Alkaline Phosphatase: 64 U/L (ref 39–117)
BUN: 17 mg/dL (ref 6–23)
CO2: 28 mEq/L (ref 19–32)
Calcium: 9.5 mg/dL (ref 8.4–10.5)
Chloride: 100 mEq/L (ref 96–112)
Creatinine, Ser: 1.21 mg/dL (ref 0.40–1.50)
GFR: 61.82 mL/min (ref >60.00)
Glucose, Bld: 169 mg/dL — ABNORMAL HIGH (ref 70–99)
Potassium: 4.6 mEq/L (ref 3.5–5.1)
Sodium: 136 mEq/L (ref 135–145)
Total Bilirubin: 0.8 mg/dL (ref 0.2–1.2)
Total Protein: 7.4 g/dL (ref 6.0–8.3)

## 2019-12-22 LAB — LDL CHOLESTEROL, DIRECT: Direct LDL: 97 mg/dL

## 2019-12-22 LAB — HEMOGLOBIN A1C: Hgb A1c MFr Bld: 8.5 % — ABNORMAL HIGH (ref 4.6–6.5)

## 2019-12-22 NOTE — Progress Notes (Signed)
Agree. Thanks

## 2019-12-27 ENCOUNTER — Other Ambulatory Visit: Payer: Self-pay

## 2019-12-27 ENCOUNTER — Ambulatory Visit: Payer: Medicare PPO | Admitting: Family Medicine

## 2019-12-27 ENCOUNTER — Encounter: Payer: Self-pay | Admitting: Family Medicine

## 2019-12-27 VITALS — BP 130/70 | HR 60 | Temp 97.6°F | Ht 71.0 in | Wt 243.1 lb

## 2019-12-27 DIAGNOSIS — E119 Type 2 diabetes mellitus without complications: Secondary | ICD-10-CM | POA: Diagnosis not present

## 2019-12-27 DIAGNOSIS — Z23 Encounter for immunization: Secondary | ICD-10-CM

## 2019-12-27 MED ORDER — AZELASTINE HCL 0.1 % NA SOLN: 2.0000 | Freq: Two times a day (BID) | NASAL | 12 refills | Status: DC | PRN

## 2019-12-27 NOTE — Patient Instructions (Signed)
Recheck with labs ahead of time in about 3 months.  See about getting that lined up with coumadin clinic.  I'd like to see you on the day of labs or soon thereafter.  Keep going as is.  Update me as needed.  Take care.  Glad to see you.

## 2019-12-27 NOTE — Progress Notes (Signed)
This visit occurred during the SARS-CoV-2 public health emergency.  Safety protocols were in place, including screening questions prior to the visit, additional usage of staff PPE, and extensive cleaning of exam room while observing appropriate contact time as indicated for disinfecting solutions.  Diabetes:  Using medications without difficulties: yes, see below.   Hypoglycemic episodes:no Hyperglycemic episodes:no Feet problems:no Blood Sugars averaging: usually 150-200, occ down to 110 eye exam within last year: yes, d/w pt about routine f/u.   Restarted glipizide, 5mg  in the AM with extra tab if AM sugar >150.  Sugar improved in the meantime.    A1c and Cr higher than prev, labs d/w pt.    Flu shot d/w pt.  He needed refill on azelastine, it prev helped post nasal gtt w/o ADE.    Meds, vitals, and allergies reviewed.  ROS: Per HPI unless specifically indicated in ROS section   GEN: nad, alert and oriented HEENT: ncat NECK: supple w/o LA CV: rrr. Click noted at baseline.   PULM: ctab, no inc wob ABD: soft, +bs EXT: no edema SKIN: no acute rash  Diabetic foot exam: Normal inspection No skin breakdown No calluses  Normal DP pulses Normal sensation to light touch and monofilament Nails normal

## 2019-12-29 NOTE — Assessment & Plan Note (Signed)
A1c is higher than previous.  He restarted glipizide with an extra tablet if his sugar was high in the morning.  His sugar improved in the meantime.  We can recheck his A1c at the next office visit as it may improve in the meantime with him taking his medications as is, along with working on diet and exercise.  He will update me as needed.  If he is having significant sugar elevations in the meantime he will let me know.  Okay for outpatient follow-up.  We talked about his creatinine.  It is not a severe elevation and we can recheck it with the next set of labs.  He agrees to plan.

## 2020-01-18 ENCOUNTER — Other Ambulatory Visit: Payer: Self-pay | Admitting: Family Medicine

## 2020-01-18 DIAGNOSIS — Z7901 Long term (current) use of anticoagulants: Secondary | ICD-10-CM

## 2020-01-18 NOTE — Telephone Encounter (Signed)
Please Advise

## 2020-01-19 NOTE — Telephone Encounter (Signed)
Sent. Thanks.   

## 2020-01-31 ENCOUNTER — Other Ambulatory Visit: Payer: Self-pay | Admitting: Cardiology

## 2020-02-02 ENCOUNTER — Other Ambulatory Visit: Payer: Self-pay

## 2020-02-03 ENCOUNTER — Ambulatory Visit: Payer: Medicare PPO

## 2020-02-03 DIAGNOSIS — Z7901 Long term (current) use of anticoagulants: Secondary | ICD-10-CM | POA: Diagnosis not present

## 2020-02-03 LAB — POCT INR: INR: 1.8 — AB (ref 2.0–3.0)

## 2020-02-03 NOTE — Patient Instructions (Addendum)
Pre visit review using our clinic review tool, if applicable. No additional management support is needed unless otherwise documented below in the visit note.  Increase dose today to 8mg  then continue to take 4 mg daily except 2 mg on Monday and Fridays.  Recheck in 5 weeks.

## 2020-02-04 DIAGNOSIS — E119 Type 2 diabetes mellitus without complications: Secondary | ICD-10-CM | POA: Diagnosis not present

## 2020-02-09 ENCOUNTER — Other Ambulatory Visit: Payer: Self-pay | Admitting: Family Medicine

## 2020-02-24 ENCOUNTER — Other Ambulatory Visit: Payer: Self-pay | Admitting: Family Medicine

## 2020-03-09 ENCOUNTER — Ambulatory Visit: Payer: Medicare PPO

## 2020-03-09 ENCOUNTER — Other Ambulatory Visit: Payer: Self-pay

## 2020-03-09 DIAGNOSIS — Z952 Presence of prosthetic heart valve: Secondary | ICD-10-CM

## 2020-03-09 DIAGNOSIS — I4891 Unspecified atrial fibrillation: Secondary | ICD-10-CM

## 2020-03-09 DIAGNOSIS — Z7901 Long term (current) use of anticoagulants: Secondary | ICD-10-CM

## 2020-03-09 LAB — POCT INR: INR: 2.1 (ref 2.0–3.0)

## 2020-03-09 NOTE — Patient Instructions (Addendum)
Pre visit review using our clinic review tool, if applicable. No additional management support is needed unless otherwise documented below in the visit note.  Continue to take 4 mg daily except 2 mg on Monday and Fridays.  Recheck in 6 weeks.   

## 2020-03-10 NOTE — Progress Notes (Signed)
Agree. Thanks

## 2020-03-17 ENCOUNTER — Telehealth: Payer: Self-pay

## 2020-03-17 NOTE — Telephone Encounter (Signed)
I spoke with pts wife; pt has had rash for couple of weeks and trying to do some suggestions that were given by access nurse. Pt is presently leaving [facility] [address]; Dr Glori Bickers had appt available at 3 PM today but pts wife said she was not sure they could make that appt and they will keep appt on 03/20/20 at 3:20 with Dr Lorelei Pont. UC & ED precautions given and pts wife voiced understanding. No swelling of face, mouth,tongue or throat and no difficulty breathing and pts wife said he is doing some better than earlier. Sending note to DR Damita Dunnings as PCP and Dr Lorelei Pont.

## 2020-03-17 NOTE — Telephone Encounter (Signed)
[facility] - Client TELEPHONE ADVICE RECORD [facility] Patient Name: [name] [name] Gender: Male DOB: 12-03-1950 Age: 70 Y 18 D Return Phone Number: [phone] (Primary), [phone] (Secondary) [place] [address] Client [facility] - Client Client Site [place] Physician Renford Dills - MD Contact Type Call Who Is Calling Patient / Member / Family / Caregiver Call Type Triage / Clinical Caller Name Akhil Piscopo Relationship To Patient Spouse Return Phone Number [phone] (Primary) Chief Complaint Rash - Localized Reason for Call Symptomatic / Request for Health Information Initial Comment Caller wife states husband is having bad rash up back, back of arms, and shoulders, itch. Caller states she tried help it, changing sheets, washing clothes, stripping bed, spraying bed. She tried cream. Translation No Nurse Assessment Nurse: Glean Salvo, RN, Magda Paganini Date/Time (Eastern Time): 03/17/2020 12:35:12 PM Confirm and document reason for call. If symptomatic, describe symptoms. ---Caller states her husband broke out in bumps on his back, back of his arms and shoulders. Has been going on for weeks, tried Benadryl without success. Is using cream recommend by MD without success. Does the patient have any new or worsening symptoms? ---Yes Will a triage be completed? ---Yes Related visit to physician within the last 2 weeks? ---No Does the PT have any chronic conditions? (i.e. diabetes, asthma, this includes High risk factors for pregnancy, etc.) ---Yes List chronic conditions. ---Diabetes, valve replacement, hx pancreas trouble Is this a behavioral health or substance abuse call? ---No Guidelines Guideline Title Affirmed Question Affirmed Notes Nurse Date/Time (Eastern Time) Rash or Redness - Widespread SEVERE itching (i.e., interferes with sleep, normal  activities or school) Rebecca Eaton 03/17/2020 12:36:03 PM Disp. Time Eilene Ghazi Time) Disposition Final User 03/17/2020 12:43:27 PM See PCP within 24 Hours Yes Glean Salvo, RN, Magda Paganini PLEASE NOTE: All timestamps contained within this report are represented as [place] Standard Time. CONFIDENTIALTY NOTICE: This fax transmission is intended only for the addressee. It contains information that is legally privileged, confidential or otherwise protected from use or disclosure. If you are not the intended recipient, you are strictly prohibited from reviewing, disclosing, copying using or disseminating any of this information or taking any action in reliance on or regarding this information. If you have received this fax in error, please notify us immediately by telephone so that we can arrange for its return to [place]. Phone: [phone], Toll-Free: [phone], Fax: [phone] Page: 2 of 2 Call Id: [id number] [place] Disagree/Comply Comply Caller Understands Yes PreDisposition Call Doctor Care Advice Given Per Guideline SEE PCP WITHIN 24 HOURS: * IF OFFICE WILL BE OPEN: You need to be examined within the next 24 hours. Call your doctor (or NP/PA) when the office opens and make an appointment. * CETIRIZINE (REACTINE, ZYRTEC): The adult dose is 10 mg and you take it once a day. Cetirizine is available in the [place] as Zyrtec and in [place] as Reactine. * LORATADINE (ALAVERT, CLARITIN): The adult dose is 10 mg and you take it once a day. Loratadine is available in the [place] as Engineer, maintenance (IT); it is available in [place] as Claritin. ANTIHISTAMINE MEDICINES FOR MODERATE TO SEVERE ITCHING: * For moderate to severe itching, take either cetirizine or loratadine. * They are over-the-counter (OTC) antihistamine medicines. You can buy them at the drugstore. HYDROCORTISONE CREAM FOR ITCHING: * You can use hydrocortisone for very itchy spots. * Put 1% hydrocortisone cream on the itchy area(s) 3  times a day. Use  it for a couple days, until it feels better. This will help decrease the itching. REDUCING THE ITCH - OATMEAL (AVEENO) BATH: * Sprinkle contents of one packet of Aveeno under running faucet with comfortably warm water. CALL BACK IF: * Rash becomes purple or blood-colored or blister-like * Fever occurs * You become worse CARE ADVICE given per Rash - Widespread and Cause Unknown (Adult) guideline. Referrals REFERRED TO PCP OFFICE

## 2020-03-19 NOTE — Telephone Encounter (Signed)
Noted. Thanks.

## 2020-03-20 ENCOUNTER — Other Ambulatory Visit: Payer: Self-pay

## 2020-03-20 ENCOUNTER — Ambulatory Visit: Payer: Medicare PPO | Admitting: Family Medicine

## 2020-03-20 ENCOUNTER — Encounter: Payer: Self-pay | Admitting: Family Medicine

## 2020-03-20 VITALS — BP 130/72 | HR 71 | Temp 97.2°F | Ht 71.0 in | Wt 243.5 lb

## 2020-03-20 DIAGNOSIS — L239 Allergic contact dermatitis, unspecified cause: Secondary | ICD-10-CM | POA: Diagnosis not present

## 2020-03-20 MED ORDER — TRIAMCINOLONE ACETONIDE 0.1 % EX CREA: 1.0000 "application " | TOPICAL_CREAM | Freq: Two times a day (BID) | CUTANEOUS | 0 refills | Status: DC

## 2020-03-20 MED ORDER — PREDNISONE 20 MG PO TABS: ORAL_TABLET | ORAL | 0 refills | Status: DC

## 2020-03-20 NOTE — Progress Notes (Signed)
Joletta Manner T. Reginald Weida, MD, [place] at [facility] [place], [address]  Phone: [phone]  FAX: [phone]  [name] [name] - 70 y.o. male  MRN [id number]  Date of Birth: [birth year]  Date: 03/20/2020  PCP: Tonia Ghent, MD  Referral: Tonia Ghent, MD  Chief Complaint  Patient presents with  . Rash    All over-Itchy    This visit occurred during the SARS-CoV-2 public health emergency.  Safety protocols were in place, including screening questions prior to the visit, additional usage of staff PPE, and extensive cleaning of exam room while observing appropriate contact time as indicated for disinfecting solutions.   Subjective:   [name] [name] is a 70 y.o. very pleasant male patient with Body mass index is 33.96 kg/m. who presents with the following:  He has been having a rash relatively diffusely for about the last 2 weeks.  He has not tried any new supplements, medications, soaps, detergents, or anything else of this nature.  He has gotten a rash with the topical adhesives for Dexcom.  This is localized and has the appearance of a circle at the place of adhesive.    Review of Systems is noted in the HPI, as appropriate  Objective:   BP 130/72   Pulse 71   Temp (!) 97.2 F (36.2 C) (Temporal)   Ht $R'5\' 11"'Sk$  (1.803 m)   Wt 243 lb 8 oz (110.5 kg)   SpO2 97%   BMI 33.96 kg/m   GEN: No acute distress; alert,appropriate. PULM: Breathing comfortably in no respiratory distress PSYCH: Normally interactive.         Laboratory and Imaging Data:  Assessment and Plan:     ICD-10-CM   1. Allergic dermatitis  L23.9    The patient does have an allergic reaction locally to Dexcom adhesive.  Unclear if this is causing his global rash.  I am getting give the patient some oral steroids and give him a 1 pound jar of triamcinolone with double the strength of what  he is currently using.  I cautioned his use of Dexcom, but he wants to try this again after the current rash improves.  If he does, recommended that he stop immediately if he gets another rash.  Meds ordered this encounter  Medications  . triamcinolone (KENALOG) 0.1 %    Sig: Apply 1 application topically 2 (two) times daily.    Dispense:  456.6 g    Refill:  0    One jar  . predniSONE (DELTASONE) 20 MG tablet    Sig: 2 tabs po daily for 5 days, then 1 tab po daily for 5 days    Dispense:  15 tablet    Refill:  0   There are no discontinued medications. No orders of the defined types were placed in this encounter.   Follow-up: No follow-ups on file.  Signed,  Maud Deed. Deondra Labrador, MD   Outpatient Encounter Medications as of 03/20/2020  Medication Sig  . ACCU-CHEK AVIVA PLUS test strip USE AS DIRECTED TO CHECK BLOOD SUGAR 2 TO 4 TIMES A DAY  . Accu-Chek FastClix Lancets MISC USE AS DIRECTED TO CHECK BLOOD SUGAR TWICE DAILY  . amoxicillin (AMOXIL) 500 MG tablet Take 2,000 mg by mouth once. Before dental visits.  [name] azelastine (ASTELIN) 0.1 % nasal spray Place 2 sprays into both nostrils 2 (two) times daily as needed for rhinitis.  Use in each nostril as directed  . Blood Glucose Monitoring Suppl (ACCU-CHEK AVIVA PLUS) W/DEVICE KIT 1 kit by Does not apply route once.  . carvedilol (COREG) 6.25 MG tablet TAKE 1 TABLET BY MOUTH TWICE A DAY. KEEPOFFICE VISIT  . cetirizine (ZYRTEC) 10 MG tablet Take 10 mg by mouth daily.  . Continuous Blood Gluc Receiver ([place]) DEVI by Does not apply route.  . diphenhydrAMINE (BENADRYL) 25 MG tablet Take 25 mg by mouth every 6 (six) hours as needed for itching.  . furosemide (LASIX) 20 MG tablet TAKE 1 TABLET BY MOUTH ONCE DAILY  . glipiZIDE (GLUCOTROL) 5 MG tablet TAKE 2 TABLES BY MOUTH DAILY BEFORE BREAKFAST IF AM SUGAR IS ABOVE 150  . Lancets Misc. (ACCU-CHEK FASTCLIX LANCET) KIT AS DIRECTED  . losartan (COZAAR) 100 MG tablet Take 1  tablet (100 mg total) by mouth daily.  . metFORMIN (GLUCOPHAGE) 500 MG tablet TAKE 2 TABLETS BY MOUTH TWICE A DAY  . potassium chloride (KLOR-CON) 10 MEQ tablet TAKE 1 TABLET BY MOUTH TWICE A DAY  . pravastatin (PRAVACHOL) 20 MG tablet TAKE 1 TABLET BY MOUTH ONCE A DAY  . predniSONE (DELTASONE) 20 MG tablet 2 tabs po daily for 5 days, then 1 tab po daily for 5 days  . psyllium (METAMUCIL) 58.6 % packet Take 1 packet by mouth daily.  [name] triamcinolone (KENALOG) 0.1 % Apply 1 application topically 2 (two) times daily.  [name] triamcinolone cream (KENALOG) 0.5 % APPLY 1 APPLICATION TOPICALLY 3 TIMES A DAY  . warfarin (COUMADIN) 1 MG tablet TAKE AS DIRECTED BY ANTICOAGULATION CLINIC  . warfarin (COUMADIN) 3 MG tablet TAKE AS DIRECTED BY COUMADIN CLINIC   No facility-administered encounter medications on file as of 03/20/2020.

## 2020-03-21 ENCOUNTER — Other Ambulatory Visit (INDEPENDENT_AMBULATORY_CARE_PROVIDER_SITE_OTHER): Payer: Medicare PPO

## 2020-03-21 DIAGNOSIS — E119 Type 2 diabetes mellitus without complications: Secondary | ICD-10-CM

## 2020-03-21 LAB — BASIC METABOLIC PANEL
BUN: 17 mg/dL (ref 6–23)
CO2: 22 mEq/L (ref 19–32)
Calcium: 9.5 mg/dL (ref 8.4–10.5)
Chloride: 101 mEq/L (ref 96–112)
Creatinine, Ser: 1.08 mg/dL (ref 0.40–1.50)
GFR: 70.74 mL/min (ref >60.00)
Glucose, Bld: 266 mg/dL — ABNORMAL HIGH (ref 70–99)
Potassium: 4.9 mEq/L (ref 3.5–5.1)
Sodium: 135 mEq/L (ref 135–145)

## 2020-03-21 LAB — HEMOGLOBIN A1C: Hgb A1c MFr Bld: 8.3 % — ABNORMAL HIGH (ref 4.6–6.5)

## 2020-03-27 ENCOUNTER — Ambulatory Visit: Payer: Medicare PPO | Admitting: Family Medicine

## 2020-03-27 ENCOUNTER — Other Ambulatory Visit: Payer: Self-pay

## 2020-03-27 ENCOUNTER — Encounter: Payer: Self-pay | Admitting: Family Medicine

## 2020-03-27 DIAGNOSIS — R21 Rash and other nonspecific skin eruption: Secondary | ICD-10-CM

## 2020-03-27 DIAGNOSIS — E119 Type 2 diabetes mellitus without complications: Secondary | ICD-10-CM | POA: Diagnosis not present

## 2020-03-27 MED ORDER — GLIPIZIDE 5 MG PO TABS: ORAL_TABLET | ORAL | Status: DC

## 2020-03-27 NOTE — Progress Notes (Signed)
This visit occurred during the SARS-CoV-2 public health emergency.  Safety protocols were in place, including screening questions prior to the visit, additional usage of staff PPE, and extensive cleaning of exam room while observing appropriate contact time as indicated for disinfecting solutions.  Diabetes:  Using medications without difficulties: metformin and glipizide.   Hypoglycemic episodes:no Hyperglycemic episodes: see below.   Feet problems: no Blood Sugars averaging: 150-200 prior to prednisone, higher on prednisone.   eye exam within last year: f/u pending for next month.   Recent labs d/w pt.  A1c 8.3.  Cr wnl.    "Staying in the house and eating all the time is the issue."  He anticipates inc in activity in the spring and diet improvement which would help A1c, d/w pt.    Seen last week with rash.  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  Treated with steroids and TAC in the meantime.  He also had concurrent leg rash.  Both are clearly better in the meantime.  He is finishing prednisone soon.    Meds, vitals, and allergies reviewed.   ROS: Per HPI unless specifically indicated in ROS section   GEN: nad, alert and oriented HEENT: ncat NECK: supple w/o LA CV: rrr with rare ectopy, with click noted at baseline.   PULM: ctab, no inc wob ABD: soft, +bs EXT: no edema SKIN: resolving well demarcated rash on the B abdomen at prev dexcom sites.  Rash on L shin resolved but chronic brawny changes noted.

## 2020-03-27 NOTE — Assessment & Plan Note (Signed)
Improved, d/w pt.  Allergy list updated.  Would avoid dexcom patches.  Finish prednisone and update me as needed.  He agrees.

## 2020-03-27 NOTE — Assessment & Plan Note (Signed)
Recent labs d/w pt.  A1c 8.3.  Cr wnl.    "Staying in the house and eating all the time is the issue."  He anticipates inc in activity in the spring and diet improvement which would help A1c, d/w pt.    Continue metformin.  Continue taking the higher dose of glipizide if needed.  I expect his sugar to improve after stopping prednisone and then with more activity.  Update me as needed.  Plan on recheck in about 3 months with A1c at the visit.

## 2020-03-27 NOTE — Patient Instructions (Signed)
Keep taking the higher dose of glipizide if needed.  I expect your sugar to improve after stopping prednisone and then with more activity.  Update me as needed.  Plan on recheck in about 3 months with A1c at the visit.  Take care.  Glad to see you.

## 2020-03-31 ENCOUNTER — Telehealth: Payer: Self-pay

## 2020-03-31 NOTE — Telephone Encounter (Signed)
[facility] - Client TELEPHONE ADVICE RECORD AccessNurse Patient Name: [name] [name] Gender: Male DOB: Jun 18, 1950 Age: 70 Y 1 M 4 D Return Phone Number: [phone] (Primary), [phone] (Secondary) [place] [address] Client [facility] - Client Client Site [place] Physician Renford Dills - MD Contact Type Call Who Is Calling Patient / Member / Family / Caregiver Call Type Triage / Clinical Caller Name Child [place]oy Relationship To Patient Spouse Return Phone Number [phone] (Primary) Chief Complaint Rash - Localized Reason for Call Symptomatic / Request for Health Information Initial Comment Caller wife states husband is having bad rash up back, back of arms, and shoulders, itch. Caller states she tried help it, changing sheets, washing clothes, stripping bed, spraying bed. She tried cream. Translation No Nurse Assessment Nurse: Glean Salvo, RN, Magda Paganini Date/Time (Eastern Time): 03/17/2020 12:35:12 PM Confirm and document reason for call. If symptomatic, describe symptoms. ---Caller states her husband broke out in bumps on his back, back of his arms and shoulders. Has been going on for weeks, tried Benadryl without success. Is using cream recommend by MD without success. Does the patient have any new or worsening symptoms? ---Yes Will a triage be completed? ---Yes Related visit to physician within the last 2 weeks? ---No Does the PT have any chronic conditions? (i.e. diabetes, asthma, this includes High risk factors for pregnancy, etc.) ---Yes List chronic conditions. ---Diabetes, valve replacement, hx pancreas trouble Is this a behavioral health or substance abuse call? ---No Guidelines Guideline Title Affirmed Question Affirmed Notes Nurse Date/Time (Eastern Time) Rash or Redness - Widespread SEVERE itching (i.e., interferes with sleep, normal  activities or school) Rebecca Eaton 03/17/2020 12:36:03 PM Disp. Time Eilene Ghazi Time) Disposition Final User 03/17/2020 12:43:27 PM See PCP within 24 Hours Yes Glean Salvo, RN, Magda Paganini PLEASE NOTE: All timestamps contained within this report are represented as [place] Standard Time. CONFIDENTIALTY NOTICE: This fax transmission is intended only for the addressee. It contains information that is legally privileged, confidential or otherwise protected from use or disclosure. If you are not the intended recipient, you are strictly prohibited from reviewing, disclosing, copying using or disseminating any of this information or taking any action in reliance on or regarding this information. If you have received this fax in error, please notify us immediately by telephone so that we can arrange for its return to [place]. Phone: [phone], Toll-Free: [phone], Fax: [phone] Page: 2 of 2 Call Id: [id number] [place] Disagree/Comply Comply Caller Understands Yes PreDisposition Call Doctor Care Advice Given Per Guideline SEE PCP WITHIN 24 HOURS: * IF OFFICE WILL BE OPEN: You need to be examined within the next 24 hours. Call your doctor (or NP/PA) when the office opens and make an appointment. * CETIRIZINE (REACTINE, ZYRTEC): The adult dose is 10 mg and you take it once a day. Cetirizine is available in the [place] as Zyrtec and in [place] as Reactine. * LORATADINE (ALAVERT, CLARITIN): The adult dose is 10 mg and you take it once a day. Loratadine is available in the [place] as Engineer, maintenance (IT); it is available in [place] as Claritin. ANTIHISTAMINE MEDICINES FOR MODERATE TO SEVERE ITCHING: * For moderate to severe itching, take either cetirizine or loratadine. * They are over-the-counter (OTC) antihistamine medicines. You can buy them at the drugstore. HYDROCORTISONE CREAM FOR ITCHING: * You can use hydrocortisone for very itchy spots. * Put 1% hydrocortisone cream on the itchy area(s) 3  times a  day. Use it for a couple days, until it feels better. This will help decrease the itching. REDUCING THE ITCH - OATMEAL (AVEENO) BATH: * Sprinkle contents of one packet of Aveeno under running faucet with comfortably warm water. CALL BACK IF: * Rash becomes purple or blood-colored or blister-like * Fever occurs * You become worse CARE ADVICE given per Rash - Widespread and Cause Unknown (Adult) guideline. Referrals REFERRED TO PCP OFFICE

## 2020-03-31 NOTE — Telephone Encounter (Signed)
I spoke with pts wife and she said she spoke with access nurse couple of wks ago and pt has already seen Dr Lorelei Pont and Dr Damita Dunnings and pt is doing a lot better. Not sure why I got this access nurse note from 03/17/20 in portal on 03/31/20. Sending note to Dr Damita Dunnings with update pt is doing better with rash. Also sending note to [facility] RN due to access nurse resending the note to [facility]. See phone note on 03/17/20.

## 2020-03-31 NOTE — Telephone Encounter (Signed)
Noted. Thanks.

## 2020-04-04 DIAGNOSIS — H2513 Age-related nuclear cataract, bilateral: Secondary | ICD-10-CM | POA: Diagnosis not present

## 2020-04-04 DIAGNOSIS — H5213 Myopia, bilateral: Secondary | ICD-10-CM | POA: Diagnosis not present

## 2020-04-04 DIAGNOSIS — H524 Presbyopia: Secondary | ICD-10-CM | POA: Diagnosis not present

## 2020-04-04 DIAGNOSIS — E119 Type 2 diabetes mellitus without complications: Secondary | ICD-10-CM | POA: Diagnosis not present

## 2020-04-04 LAB — HM DIABETES EYE EXAM

## 2020-04-17 ENCOUNTER — Other Ambulatory Visit: Payer: Self-pay | Admitting: Family Medicine

## 2020-04-20 ENCOUNTER — Ambulatory Visit: Payer: Medicare PPO

## 2020-04-20 ENCOUNTER — Other Ambulatory Visit: Payer: Self-pay

## 2020-04-20 DIAGNOSIS — Z7901 Long term (current) use of anticoagulants: Secondary | ICD-10-CM | POA: Diagnosis not present

## 2020-04-20 LAB — POCT INR: INR: 2.3 (ref 2.0–3.0)

## 2020-04-20 NOTE — Patient Instructions (Addendum)
Pre visit review using our clinic review tool, if applicable. No additional management support is needed unless otherwise documented below in the visit note.  Continue to take 4 mg daily except 2 mg on Monday and Fridays.  Recheck in 6 weeks.

## 2020-04-23 NOTE — Progress Notes (Signed)
Agree. Thanks

## 2020-04-26 ENCOUNTER — Telehealth: Payer: Self-pay

## 2020-04-26 MED ORDER — TRIAMCINOLONE ACETONIDE 0.1 % EX CREA: 1.0000 "application " | TOPICAL_CREAM | Freq: Two times a day (BID) | CUTANEOUS | 0 refills | Status: DC

## 2020-04-26 NOTE — Telephone Encounter (Signed)
pts wife said pt saw Dr Lorelei Pont on 03/20/20 for a rash on shoulder and back. Pt was given prednisone and triamcinolone cream 1% and the rash stopped itching and almost went away. Pt did have a slight splotchy rash on shoulder that never went away but the itching stopped. Mrs Range said now the itching is starting to come back and the red splotchy rash on shoulder is again spreading toward the back. A fine red rash with no blisters. Pt's wife is wondering if could get refill on triamcinolone cream to [facility]. Pt also had FU with Dr Damita Dunnings on 03/27/20. Mrs Makar said that now the rash is about the size of a large apple. Pt does not have fever or any pain. Request cb after reviewed by Dr Damita Dunnings on 04/27/20.

## 2020-04-26 NOTE — Telephone Encounter (Signed)
I sent the refill.  If that doesn't take care of it, then I would like to see him back in clinic.  Thanks.

## 2020-04-27 NOTE — Telephone Encounter (Signed)
Left message for patient that rx was sent to pharmacy and to make appt if that does not help.

## 2020-05-01 ENCOUNTER — Other Ambulatory Visit: Payer: Self-pay | Admitting: Cardiology

## 2020-05-03 NOTE — Telephone Encounter (Signed)
Patients wife called in and stated that he has began having the rash again. Called patients wife back to see if he had picked up the prescription and to see if he would like to make an office visit, but had to LVM. Awaiting call back.

## 2020-05-03 NOTE — Telephone Encounter (Signed)
Spoke with patients wife who stated that they have tried the cream, but it is not working. Patient has an appointment scheduled with Dr. Damita Dunnings on Monday (4/11) at 0930. Instructed patients wife that if symptoms became worse to call back or go to UC. Patients wife verbalized understanding.

## 2020-05-03 NOTE — Telephone Encounter (Signed)
Pt left v/m returning call at 4:30 but see where Cardell Peach has already spoke with pts wife at 4:35.

## 2020-05-03 NOTE — Telephone Encounter (Signed)
pts wife left v/m returning call and requesting cb.

## 2020-05-03 NOTE — Telephone Encounter (Signed)
LVM for patient's wife to return call. 

## 2020-05-03 NOTE — Telephone Encounter (Signed)
Noted. Thanks.

## 2020-05-08 ENCOUNTER — Other Ambulatory Visit: Payer: Self-pay

## 2020-05-08 ENCOUNTER — Ambulatory Visit: Payer: Medicare PPO | Admitting: Family Medicine

## 2020-05-08 ENCOUNTER — Encounter: Payer: Self-pay | Admitting: Family Medicine

## 2020-05-08 VITALS — BP 126/84 | HR 67 | Temp 97.3°F | Ht 71.0 in | Wt 232.0 lb

## 2020-05-08 DIAGNOSIS — L989 Disorder of the skin and subcutaneous tissue, unspecified: Secondary | ICD-10-CM

## 2020-05-08 MED ORDER — KETOCONAZOLE 2 % EX CREA: 1.0000 "application " | TOPICAL_CREAM | Freq: Every day | CUTANEOUS | 0 refills | Status: DC

## 2020-05-08 MED ORDER — FLUOCINONIDE 0.05 % EX CREA: 1.0000 "application " | TOPICAL_CREAM | Freq: Two times a day (BID) | CUTANEOUS | 1 refills | Status: DC

## 2020-05-08 NOTE — Patient Instructions (Signed)
We'll call about seeing dermatology about the spot on your right elbow.  If the rash isn't better, ask them about that.   In the meantime, stop triamcinolone.  Change to lidex.  If not better with that about 1 week, add on ketoconazole.    Update me as needed.    Take care.  Glad to see you.

## 2020-05-08 NOTE — Progress Notes (Signed)
This visit occurred during the SARS-CoV-2 public health emergency.  Safety protocols were in place, including screening questions prior to the visit, additional usage of staff PPE, and extensive cleaning of exam room while observing appropriate contact time as indicated for disinfecting solutions.  Rash.  B abdomen, R upper arm.  Can itch.  Similar lesion on the R leg.  Temporarily better with triamcinolone but not resolved.    Separate lesion near L elbow noted by patient that appears different.  Meds, vitals, and allergies reviewed.   ROS: Per HPI unless specifically indicated in ROS section   nad ncat Neck supple, no LA rrr ctab Skin well perfused with nondermatomal blotchy blanching mildly erythematous rash on the bilateral abdomen and right upper arm.  No ulceration. He is a separate lesion that is 67mm on R elbow, concern for [facility] but without ulceration.  Discussed.

## 2020-05-09 ENCOUNTER — Telehealth: Payer: Self-pay

## 2020-05-09 NOTE — Telephone Encounter (Signed)
Patients wife called and stated that insurance is requiring a PA to be completed for the Fluocinonide 0.05% Cream. PA started and awaiting response.   Duard Brady Key: [id number] - PA Case ID: [id number] Need help? Call us at [phone] Status Sent to [employer] Fluocinonide 0.05% cream Form [employer] Electronic PA Form

## 2020-05-10 NOTE — Assessment & Plan Note (Signed)
2 separate issues discussed with patient.  We'll call about seeing dermatology about the spot on his right elbow.  I would like dermatology input.  If the rash isn't better, I want him to ask dermatology about the rash also In the meantime, stop triamcinolone.  Change to lidex.  If not better with that about 1 week, add on ketoconazole.  Rationale discussed with patient.  He agrees with plan. Update me as needed.

## 2020-05-10 NOTE — Telephone Encounter (Signed)
PA has been approved until 01/27/2021.

## 2020-06-01 ENCOUNTER — Other Ambulatory Visit: Payer: Self-pay | Admitting: Family Medicine

## 2020-06-01 ENCOUNTER — Ambulatory Visit: Payer: Medicare PPO

## 2020-06-01 ENCOUNTER — Other Ambulatory Visit: Payer: Self-pay

## 2020-06-01 DIAGNOSIS — Z7901 Long term (current) use of anticoagulants: Secondary | ICD-10-CM

## 2020-06-01 LAB — POCT INR: INR: 2.5 (ref 2.0–3.0)

## 2020-06-01 NOTE — Patient Instructions (Addendum)
Pre visit review using our clinic review tool, if applicable. No additional management support is needed unless otherwise documented below in the visit note.  Continue to take 4 mg daily except 2 mg on Monday and Fridays.  Recheck in 6 weeks.

## 2020-06-04 NOTE — Progress Notes (Signed)
Agree. Thanks

## 2020-06-27 ENCOUNTER — Ambulatory Visit: Payer: Medicare PPO | Admitting: Family Medicine

## 2020-06-27 ENCOUNTER — Encounter: Payer: Self-pay | Admitting: Family Medicine

## 2020-06-27 ENCOUNTER — Other Ambulatory Visit: Payer: Self-pay | Admitting: Family Medicine

## 2020-06-27 ENCOUNTER — Other Ambulatory Visit: Payer: Self-pay

## 2020-06-27 VITALS — BP 140/80 | HR 64 | Temp 98.1°F | Ht 71.0 in | Wt 239.0 lb

## 2020-06-27 DIAGNOSIS — L989 Disorder of the skin and subcutaneous tissue, unspecified: Secondary | ICD-10-CM | POA: Diagnosis not present

## 2020-06-27 DIAGNOSIS — E119 Type 2 diabetes mellitus without complications: Secondary | ICD-10-CM

## 2020-06-27 LAB — POCT GLYCOSYLATED HEMOGLOBIN (HGB A1C): Hemoglobin A1C: 8.3 % — AB (ref 4.0–5.6)

## 2020-06-27 MED ORDER — FLUOCINONIDE 0.05 % EX CREA: TOPICAL_CREAM | CUTANEOUS | 1 refills | Status: DC

## 2020-06-27 NOTE — Patient Instructions (Addendum)
Use the smallest amount of lidex possible.  Take care.  Glad to see you. Keep the appointment with dermatology.    Please call about seeing cardiology later this year.  [facility] [phone].  Plan on recheck at [facility] with [place] in about 3 months.  A1c at the visit.  You don't have to fast.  See if you can schedule that with your monthly INR visit.

## 2020-06-27 NOTE — Progress Notes (Signed)
This visit occurred during the SARS-CoV-2 public health emergency.  Safety protocols were in place, including screening questions prior to the visit, additional usage of staff PPE, and extensive cleaning of exam room while observing appropriate contact time as indicated for disinfecting solutions.  Diabetes:  Using medications without difficulties: yes, usually taking 2 glipizide most days with AM sugar >150.  Metformin 2 tabs BID Hypoglycemic episodes: no Hyperglycemic episodes: see above Feet problems:no new sx.  He has some preexisting sensation changes on the R foot from prev injury.   Blood Sugars averaging: see above.   eye exam within last year: done about a few months ago at [place].   A1c d/w pt at [facility].   He did better with CGM but had trouble with the adhesive.  He wanted to know if another CGM had a different adhesive.  I told him I would check with pharmacy here.  Prev rash resolved with lidex and didn't need to use ketoconazole.  The he has a spot on the upper back and it also improved with lidex.  Lidex cautions d/w pt.      He has derm f/u pending.    No exertional CP.  Not SOB.  No BLE edema.    Meds, vitals, and allergies reviewed.  ROS: Per HPI unless specifically indicated in ROS section   GEN: nad, alert and oriented HEENT: ncat NECK: supple w/o LA CV: rrr.  Click noted from previous replacement. PULM: ctab, no inc wob ABD: soft, +bs EXT: no edema SKIN: no acute rash but still with 65mm irritated lesion on the R proximal forearm/elbow.  No ulceration.

## 2020-06-28 NOTE — Assessment & Plan Note (Signed)
Rash resolved.  He is going to follow-up with dermatology.  Topical steroid cautions discussed with patient.

## 2020-06-28 NOTE — Assessment & Plan Note (Signed)
He did better with CGM but had trouble with the adhesive.  He wanted to know if another CGM had a different adhesive.  I told him I would check with pharmacy here.  If he can get a continuous glucose monitor that would help him evaluate his sugar more frequently, he can likely keep it under better control.  We will see about trying to get that set up, instead of changing his medicines at this point.  See after visit summary.

## 2020-07-03 ENCOUNTER — Other Ambulatory Visit: Payer: Self-pay | Admitting: Family Medicine

## 2020-07-03 ENCOUNTER — Telehealth: Payer: Self-pay | Admitting: Family Medicine

## 2020-07-03 NOTE — Telephone Encounter (Signed)
LVM for pt to rtn my call to schedule AWV with [facility].  

## 2020-07-04 LAB — HM DIABETES EYE EXAM

## 2020-07-13 ENCOUNTER — Ambulatory Visit: Payer: Medicare PPO

## 2020-07-13 ENCOUNTER — Telehealth: Payer: Self-pay

## 2020-07-13 NOTE — Telephone Encounter (Signed)
Pt missed coumadin clinic apt today. LVM

## 2020-07-19 ENCOUNTER — Telehealth: Payer: Self-pay | Admitting: Family Medicine

## 2020-07-19 NOTE — Telephone Encounter (Signed)
Pt called in to r/s his apt and got him down for 6/28 @ 3:15

## 2020-07-20 NOTE — Telephone Encounter (Signed)
error 

## 2020-07-24 NOTE — Telephone Encounter (Signed)
Noted  

## 2020-07-25 ENCOUNTER — Ambulatory Visit: Payer: Medicare PPO

## 2020-07-25 ENCOUNTER — Other Ambulatory Visit: Payer: Self-pay

## 2020-07-25 DIAGNOSIS — Z7901 Long term (current) use of anticoagulants: Secondary | ICD-10-CM

## 2020-07-25 DIAGNOSIS — I4891 Unspecified atrial fibrillation: Secondary | ICD-10-CM

## 2020-07-25 DIAGNOSIS — Z952 Presence of prosthetic heart valve: Secondary | ICD-10-CM

## 2020-07-25 LAB — POCT INR: INR: 2.8 (ref 2.0–3.0)

## 2020-07-25 NOTE — Patient Instructions (Addendum)
Pre visit review using our clinic review tool, if applicable. No additional management support is needed unless otherwise documented below in the visit note.  Continue to take 4 mg daily except 2 mg on Monday and Fridays.  Recheck in 6 weeks.

## 2020-07-26 NOTE — Progress Notes (Signed)
Agree. Thanks

## 2020-08-09 ENCOUNTER — Other Ambulatory Visit: Payer: Self-pay | Admitting: Cardiology

## 2020-08-09 ENCOUNTER — Other Ambulatory Visit: Payer: Self-pay | Admitting: Family Medicine

## 2020-08-09 DIAGNOSIS — Z7901 Long term (current) use of anticoagulants: Secondary | ICD-10-CM

## 2020-09-05 ENCOUNTER — Other Ambulatory Visit: Payer: Self-pay | Admitting: Family Medicine

## 2020-09-05 ENCOUNTER — Other Ambulatory Visit: Payer: Self-pay | Admitting: Cardiology

## 2020-09-07 ENCOUNTER — Other Ambulatory Visit: Payer: Self-pay

## 2020-09-07 ENCOUNTER — Ambulatory Visit: Payer: Medicare PPO

## 2020-09-07 ENCOUNTER — Ambulatory Visit: Payer: Medicare PPO | Admitting: Cardiology

## 2020-09-07 DIAGNOSIS — I4891 Unspecified atrial fibrillation: Secondary | ICD-10-CM

## 2020-09-07 DIAGNOSIS — Z952 Presence of prosthetic heart valve: Secondary | ICD-10-CM

## 2020-09-07 DIAGNOSIS — Z7901 Long term (current) use of anticoagulants: Secondary | ICD-10-CM | POA: Diagnosis not present

## 2020-09-07 LAB — POCT INR: INR: 3.2 — AB (ref 2.0–3.0)

## 2020-09-07 NOTE — Patient Instructions (Addendum)
Pre visit review using our clinic review tool, if applicable. No additional management support is needed unless otherwise documented below in the visit note.  Hold dose today and the continue to take 4 mg daily except 2 mg on Monday and Fridays.  Recheck in 6 weeks per pt request.

## 2020-09-10 NOTE — Progress Notes (Signed)
Agree. Thanks

## 2020-09-29 ENCOUNTER — Other Ambulatory Visit: Payer: Self-pay

## 2020-09-29 ENCOUNTER — Ambulatory Visit: Payer: Medicare PPO | Admitting: Family Medicine

## 2020-09-29 ENCOUNTER — Other Ambulatory Visit: Payer: Self-pay | Admitting: Family Medicine

## 2020-09-29 ENCOUNTER — Encounter: Payer: Self-pay | Admitting: Family Medicine

## 2020-09-29 VITALS — BP 138/76 | HR 63 | Temp 96.8°F | Ht 71.0 in | Wt 234.0 lb

## 2020-09-29 DIAGNOSIS — E119 Type 2 diabetes mellitus without complications: Secondary | ICD-10-CM | POA: Diagnosis not present

## 2020-09-29 DIAGNOSIS — Z23 Encounter for immunization: Secondary | ICD-10-CM

## 2020-09-29 LAB — POCT GLYCOSYLATED HEMOGLOBIN (HGB A1C): Hemoglobin A1C: 7.9 % — AB (ref 4.0–5.6)

## 2020-09-29 MED ORDER — GLIPIZIDE 5 MG PO TABS: ORAL_TABLET | ORAL | Status: DC

## 2020-09-29 MED ORDER — AZELASTINE HCL 0.1 % NA SOLN: 2.0000 | Freq: Two times a day (BID) | NASAL | 12 refills | Status: DC | PRN

## 2020-09-29 NOTE — Progress Notes (Signed)
This visit occurred during the SARS-CoV-2 public health emergency.  Safety protocols were in place, including screening questions prior to the visit, additional usage of staff PPE, and extensive cleaning of exam room while observing appropriate contact time as indicated for disinfecting solutions.  Diabetes:  Using medications without difficulties: yes Hypoglycemic episodes:no Hyperglycemic episodes:no Feet problems:no Blood Sugars averaging: see below.   eye exam within last year: yes  Most days between 150-180.  He was taking 2 glipizide tabs most days.  He isn't usually taking glipizide unless AM sugar was above 150.    A1c lower.  He has been going to the [facility], water exercises 3x/week.  He can exert w/o CP.  No CP with exertion.  Some occ L sided rib pain with rest, when washing the dishes.  Random onset, noted every few months and clearly not daily.  His exercise capacity has increased with [employer] classes, not decreased.  No pain now.    Wife had covid recently.  She is getting better, d/w pt.  He wasn't sick.  He is vaccinated.  Booster d/w pt.    Flu shot today.  Discussed with patient about pending clinic (temporary) relocation and getting his follow-up INR done.  See after visit summary.  His grandson Basilio Cairo needs to get set up here as a new patient.  I told him I could take him on as a new patient.  I will await phone call from Mr. Owens Shark as he is above 35 years of age.  Meds, vitals, and allergies reviewed.   ROS: Per HPI unless specifically indicated in ROS section   GEN: nad, alert and oriented HEENT: ncat NECK: supple w/o LA CV: rrr. PULM: ctab, no inc wob ABD: soft, +bs EXT: no edema SKIN: no acute rash Chest wall not ttp.

## 2020-09-29 NOTE — Patient Instructions (Addendum)
We'll call you next week about the follow up INR appointment.  If you don't hear by 10/09/20, please call us.   Take care.  Glad to see you.  Plan on rechecking labs prior to a yearly visit in about 3 months.   I would continue taking 2 glipizide a day unless you have sugar below 100.

## 2020-10-01 NOTE — Assessment & Plan Note (Addendum)
A1c improved.  Continue glipizide as is.  Continue work on diet and exercise. Plan on rechecking labs prior to a yearly visit in about 3 months.   I would continue taking 2 glipizide a day unless his AM is sugar below 100.    I suspect his chest symptoms are muscular in nature and not ominous and he can update me as needed.  He does not have exertional symptoms.  He agrees with plan.

## 2020-10-09 ENCOUNTER — Telehealth: Payer: Self-pay

## 2020-10-09 NOTE — Telephone Encounter (Signed)
Pt has apt on 9/22 for coumadin clinic which needs to be moved to 9/23 and pt needs to be made aware it will be at [facility] on 9/23.  LVM for pt reporting apt needs moved to 9/23 and it will be at [place] station. Left on msg that time would be at 830.

## 2020-10-19 ENCOUNTER — Ambulatory Visit: Payer: Medicare PPO

## 2020-10-20 ENCOUNTER — Other Ambulatory Visit: Payer: Self-pay

## 2020-10-20 ENCOUNTER — Ambulatory Visit: Payer: Medicare PPO

## 2020-10-20 DIAGNOSIS — I4891 Unspecified atrial fibrillation: Secondary | ICD-10-CM

## 2020-10-20 DIAGNOSIS — Z952 Presence of prosthetic heart valve: Secondary | ICD-10-CM

## 2020-10-20 DIAGNOSIS — Z7901 Long term (current) use of anticoagulants: Secondary | ICD-10-CM | POA: Diagnosis not present

## 2020-10-20 LAB — POCT INR: INR: 2.1 (ref 2.0–3.0)

## 2020-10-20 NOTE — Patient Instructions (Addendum)
Pre visit review using our clinic review tool, if applicable. No additional management support is needed unless otherwise documented below in the visit note.  Continue to take 4 mg daily except 2 mg on Monday and Fridays.  Recheck in 6 weeks per pt request.

## 2020-10-21 DIAGNOSIS — K5900 Constipation, unspecified: Secondary | ICD-10-CM | POA: Diagnosis not present

## 2020-10-21 DIAGNOSIS — E669 Obesity, unspecified: Secondary | ICD-10-CM | POA: Diagnosis not present

## 2020-10-21 DIAGNOSIS — E1165 Type 2 diabetes mellitus with hyperglycemia: Secondary | ICD-10-CM | POA: Diagnosis not present

## 2020-10-21 DIAGNOSIS — Z6833 Body mass index (BMI) 33.0-33.9, adult: Secondary | ICD-10-CM | POA: Diagnosis not present

## 2020-10-21 DIAGNOSIS — E785 Hyperlipidemia, unspecified: Secondary | ICD-10-CM | POA: Diagnosis not present

## 2020-10-21 DIAGNOSIS — I10 Essential (primary) hypertension: Secondary | ICD-10-CM | POA: Diagnosis not present

## 2020-10-21 DIAGNOSIS — J309 Allergic rhinitis, unspecified: Secondary | ICD-10-CM | POA: Diagnosis not present

## 2020-10-21 DIAGNOSIS — Z7901 Long term (current) use of anticoagulants: Secondary | ICD-10-CM | POA: Diagnosis not present

## 2020-10-21 DIAGNOSIS — E11620 Type 2 diabetes mellitus with diabetic dermatitis: Secondary | ICD-10-CM | POA: Diagnosis not present

## 2020-10-22 NOTE — Progress Notes (Signed)
Agree. Thanks

## 2020-10-30 ENCOUNTER — Other Ambulatory Visit: Payer: Self-pay | Admitting: Family Medicine

## 2020-11-10 ENCOUNTER — Telehealth: Payer: Self-pay | Admitting: Family Medicine

## 2020-11-10 ENCOUNTER — Other Ambulatory Visit: Payer: Self-pay

## 2020-11-10 ENCOUNTER — Encounter: Payer: Self-pay | Admitting: Family Medicine

## 2020-11-10 ENCOUNTER — Telehealth (INDEPENDENT_AMBULATORY_CARE_PROVIDER_SITE_OTHER): Payer: Medicare PPO | Admitting: Family Medicine

## 2020-11-10 DIAGNOSIS — U071 COVID-19: Secondary | ICD-10-CM

## 2020-11-10 MED ORDER — MOLNUPIRAVIR EUA 200MG CAPSULE: 4.0000 | ORAL_CAPSULE | Freq: Two times a day (BID) | ORAL | 0 refills | Status: AC

## 2020-11-10 NOTE — Telephone Encounter (Signed)
VV today if possible. Or phone visit.  Thanks.

## 2020-11-10 NOTE — Telephone Encounter (Signed)
Spoke with patients wife and patient has had sx since Tuesday with cough, fatigue, head congestion and had low grade fever for one day. Patient is also very hoarse and can hardly talk. He tested positive for COVID on Wednesday. Has been taking  alka seltzer plus for sx. Wife wanted something sent in for him and I did let her know we do not typically send medications in unless we do a VV with the patient but that I would send a message and see what we need to do. Would you want to add patient on or send something in for him?

## 2020-11-10 NOTE — Progress Notes (Signed)
Virtual visit completed through WebEx or similar program Patient location: home  Provider location: [place] at [facility], office  Participants: Patient and me (unless stated otherwise below)  Pandemic considerations d/w pt.   Limitations and rationale for visit method d/w patient.  Patient agreed to proceed.   CC: covid   HPI:  Patient has had sx since Tuesday with cough, fatigue, head congestion and had temp 97.3.  no fevers. Patient is also very hoarse. He tested positive for COVID on Wednesday. Has been taking  alka seltzer plus for sx, with some relief.  Ribs sore from cough but can still take a deep breath, not SOB.  Dry cough initially, some occ sputum now.  Didn't lose taste and smell.  Some occ scant wheeze.  He feels better today than last night.    Meds and allergies reviewed.   ROS: Per HPI unless specifically indicated in ROS section   NAD Speech wnl  A/P: COVID.  Okay for outpatient follow-up.  Routine cautions given to patient.  Discussed quarantine and routine instructions.  Discussed emergency authorization use for antiviral medications.  Reasonable to start Molnupiravir and use delsym for cough.  He will update me as needed.  He agrees to plan.

## 2020-11-10 NOTE — Telephone Encounter (Signed)
Pt wife called in stating that pt has covid. He tested positive on Wednesday and want provider to send in medication. Pt has bad cough, no energy and sinus congestion. Pt has taken alka seltzer plus for relief but that has not helped. Pt wife is asked to be called when medication is in

## 2020-11-10 NOTE — Telephone Encounter (Signed)
Called and scheduled pt

## 2020-11-12 DIAGNOSIS — U071 COVID-19: Secondary | ICD-10-CM | POA: Insufficient documentation

## 2020-11-12 NOTE — Assessment & Plan Note (Signed)
COVID.  Okay for outpatient follow-up.  Routine cautions given to patient.  Discussed quarantine and routine instructions.  Discussed emergency authorization use for antiviral medications.  Reasonable to start Molnupiravir and use delsym for cough.  He will update me as needed.  He agrees to plan.

## 2020-11-14 ENCOUNTER — Telehealth: Payer: Self-pay

## 2020-11-14 NOTE — Telephone Encounter (Signed)
Reviewing upcoming schedule and there is a note the pt was diagnosed with covid on 10/14 and prescribed an antiviral for covid.  These antivirals can effect warfarin. LVM that apt scheduled on 11/4 should be moved up to 10/21 or 10/28.

## 2020-11-15 ENCOUNTER — Other Ambulatory Visit: Payer: Self-pay | Admitting: Family Medicine

## 2020-11-15 ENCOUNTER — Other Ambulatory Visit: Payer: Self-pay | Admitting: Cardiology

## 2020-11-15 DIAGNOSIS — Z7901 Long term (current) use of anticoagulants: Secondary | ICD-10-CM

## 2020-11-15 NOTE — Telephone Encounter (Signed)
Pt is compliant with coumadin management. Sent in refill

## 2020-11-16 ENCOUNTER — Ambulatory Visit: Payer: Medicare PPO | Admitting: Dermatology

## 2020-11-24 ENCOUNTER — Ambulatory Visit: Payer: Medicare PPO

## 2020-11-24 ENCOUNTER — Other Ambulatory Visit: Payer: Self-pay

## 2020-11-24 DIAGNOSIS — Z7901 Long term (current) use of anticoagulants: Secondary | ICD-10-CM

## 2020-11-24 LAB — POCT INR: INR: 3.1 — AB (ref 2.0–3.0)

## 2020-11-24 NOTE — Patient Instructions (Addendum)
Pre visit review using our clinic review tool, if applicable. No additional management support is needed unless otherwise documented below in the visit note.  Hold dose today and then continue to take 4 mg daily except 2 mg on Monday and Fridays.  Recheck in 6 weeks per pt request.

## 2020-11-28 NOTE — Progress Notes (Deleted)
Cardiology Office Note   Date:  11/28/2020   ID:  [name] [name], DOB 06/17/1950, MRN [id number]  PCP:  Tonia Ghent, MD  Cardiologist:   None   No chief complaint on file.     History of Present Illness: [name] [name] is a 70 y.o. male who presents for follow up after aortic valve replacement.  Since I last saw him ***   ***  he has done well. The patient denies any new symptoms such as chest discomfort, neck or arm discomfort. There has been no new shortness of breath, PND or orthopnea. There have been no reported palpitations, presyncope or syncope.  He walks with his dog and walks for exercise and does stuff around the house.  With this he has no new symptoms.    Past Medical History:  Diagnosis Date   Anemia    Aortic insufficiency    with bicuspid aortic valve (the last MRI demonstrated the aortic root to be 4.8 x 4.7 cm.) Mod regurgitation. Normal chamber size with very mild left ventricular dysfunction.)   Blood transfusion    CHF (congestive heart failure) ([place])    Cough 06/28/2009   Dr. 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   Pericardial effusion 07/2009   admission to [facility], s/p pericardial window   Pleural effusion 10/2009   s/p thoracentesis   PNA (pneumonia) 10/2009   admnission to [facility], with right pleural effusion, s/p thoracentesis   Syncope 08/2009   admission for fever and syncope, no source for fever seen on cultures. Syncope thought to be r/t orthostasis    Past Surgical History:  Procedure Laterality Date   CARDIAC VALVE REPLACEMENT  07/10/2009   AVR   Cardiolite EKG   03/10/2003   (-) ? small/apical infarct EF 50%   DOPPLER ECHOCARDIOGRAPHY  01/18/97   LVH, mild dilated aorta - root mod to severe aortic regurg EF 50-55%   DOPPLER ECHOCARDIOGRAPHY  09/27/03   No changes, moderate severe aortic regurg   DOPPLER ECHOCARDIOGRAPHY  07/09/2004   Mod severe aortic regurg 2-3+ T.R., T.I.R., mild P..R.   DOPPLER ECHOCARDIOGRAPHY  04/01/06   Hypokin Apex EF 55%   DOPPLER ECHOCARDIOGRAPHY  02/15/2010   Mild LVH, mild decr Sys fctn EF 40-45% Mild Dias Dysfctn Mech AV Triv AR, MR   ESOPHAGOGASTRODUODENOSCOPY  1990, 1999   Hot dog in throat (1987)/ Chicken in throat (1990)/ 01/06/97 (Dr. Earlean Shawl) Impaction, dilatation, Schatzki's Ring/ 07/1998 repeat Dilatation/ 01/10/01 stricture/dilated/ 03/08/05 Schatzki's Ring, dilated/ 06/01/08 Food Disimpaction [name] Bulb Ulcer (Dr. Watt Climes)   ESOPHAGOGASTRODUODENOSCOPY  01/06/1997   (Dr. Earlean Shawl) Impaction, dilatation, Schatzki's Ring   ESOPHAGOGASTRODUODENOSCOPY  08/09/98   Repeat dilatation [name] [name])   ESOPHAGOGASTRODUODENOSCOPY  01/13/2001   stricture/ HH, duodenal ulcer / bleeding [name] [name])   ESOPHAGOGASTRODUODENOSCOPY  12/25/01   esophagus stricture, dilated   ESOPHAGOGASTRODUODENOSCOPY  03/23/2001   stricture, dilated   ESOPHAGOGASTRODUODENOSCOPY  03/08/05   Schatzki's Ring   03/13/05  Schatzki's Ring - dilated, duodenitis   ESOPHAGOGASTRODUODENOSCOPY  06/11/08   with food disimpaction.  Food impaction [facility]  Sm bulb ulcer (Dr. Watt Climes)   ESOPHAGOGASTRODUODENOSCOPY     stricture/HH, duodenal ulcer/bleeding [name] [name])   ESOPHAGOGASTRODUODENOSCOPY N/A 01/23/2016   Procedure: ESOPHAGOGASTRODUODENOSCOPY (EGD);  Surgeon: [name] Lame, MD;  Location: [facility] ENDOSCOPY;  Service: Endoscopy;  Laterality: N/A;   ESOPHAGOGASTRODUODENOSCOPY (EGD) WITH PROPOFOL N/A 02/06/2016   Procedure: ESOPHAGOGASTRODUODENOSCOPY (EGD) WITH PROPOFOL;  Surgeon: [name] Lame, MD;  Location: [facility] ENDOSCOPY;  Service: Endoscopy;  Laterality: N/A;   HERNIA REPAIR  2011    abdominal; post AVR   MRI  2/05   LVH global hypokin EF 48% Mod A.I.   VALVE REPLACEMENT  06/2009   St. Jude mechanical valve per Dr. Cyndia [name]     Current Outpatient Medications  Medication Sig Dispense Refill   ACCU-CHEK AVIVA PLUS test strip USE AS DIRECTED TO CHECK BLOOD SUGAR 2 TO 4 TIMES A DAY 100 each 11   Accu-Chek FastClix Lancets MISC USE AS DIRECTED TO CHECK BLOOD SUGAR TWICE DAILY 102 each 4   amoxicillin (AMOXIL) 500 MG tablet Take 2,000 mg by mouth once. Before dental visits.     azelastine (ASTELIN) 0.1 % nasal spray Place 2 sprays into both nostrils 2 (two) times daily as needed for rhinitis. Use in each nostril as directed 30 mL 12   carvedilol (COREG) 6.25 MG tablet TAKE 1 TABLET BY MOUTH TWICE A DAY. KEEPOFFICE VISIT 180 tablet 0   cetirizine (ZYRTEC) 10 MG tablet Take 10 mg by mouth daily.     fluocinonide cream (LIDEX) 3.33 % APPLY 1 APPLICATION TO AFFECTEDE AREAS 2 TIMES DAILY AS NEEDED 30 g 1   furosemide (LASIX) 20 MG tablet TAKE 1 TABLET BY MOUTH ONCE DAILY 90 tablet 2   glipiZIDE (GLUCOTROL) 5 MG tablet Take 2 tabs with breakfast.  If AM sugar is below 100, then take 1 tab.     Lancets Misc. (ACCU-CHEK FASTCLIX LANCET) KIT AS DIRECTED 1 kit 0   losartan (COZAAR) 100 MG tablet TAKE 1 TABLET BY MOUTH ONCE A DAY 90 tablet 0   metFORMIN (GLUCOPHAGE) 500 MG tablet TAKE 2 TABLETS BY MOUTH TWICE A DAY 360 tablet 3   potassium chloride (KLOR-CON) 10 MEQ tablet TAKE 1 TABLET BY MOUTH TWICE A DAY 180 tablet 3   pravastatin (PRAVACHOL) 20 MG tablet TAKE 1 TABLET BY MOUTH ONCE A DAY 90 tablet 0   psyllium (METAMUCIL) 58.6 % packet Take 1 packet by mouth daily.     warfarin (COUMADIN) 1 MG tablet TAKE ON TABLET BY MOUTH DAILY EXCEPT TAKE 2 TABLETS ON MONDAYS AND FRIDAYS OR AS DIRECTED BY ANTICOAGULATION CLINIC (ALSO TAKE 3MG DOSE TABLETS AS DIRECTED BY ANTICOAGULATION CLINIC) 120 tablet 2   warfarin (COUMADIN) 3 MG tablet TAKE 1 TABLET BY MOUTH ON SUNDAYS, TUESDAYS, WEDNESDAYS,  AND SATURDAYS OR AS DIRECTED BY ANTICOAGULATION CINIC 70 tablet 0   No current facility-administered medications for this visit.    Allergies:   Ace inhibitors, Aspirin, Other, Primaxin [imipenem w/cilastatin sodium], and Tape     ROS:  Please see the history of present illness.   Otherwise, review of systems are positive for ***.   All other systems are reviewed and negative.    PHYSICAL EXAM: VS:  There were no vitals taken for this visit. , BMI There is no height or weight on file to calculate BMI. GENERAL:  Well appearing NECK:  No jugular venous distention, waveform within normal limits, carotid upstroke brisk and symmetric, no bruits, no thyromegaly LUNGS:  Clear to auscultation bilaterally CHEST:  Unremarkable HEART:  PMI not displaced or sustained,S1 and S2 within normal limits, no S3, no S4, no clicks, no rubs, *** murmurs ABD:  Flat, positive bowel sounds normal in frequency in pitch, no bruits, no rebound, no guarding, no midline pulsatile mass, no hepatomegaly, no splenomegaly EXT:  2 plus pulses throughout, no edema, no cyanosis no clubbing     ***GENERAL:  Well appearing HEENT:  Pupils equal round and reactive, fundi not visualized, oral mucosa unremarkable NECK:  No jugular venous distention, waveform within normal limits, carotid upstroke brisk and symmetric, no bruits, no thyromegaly LYMPHATICS:  No cervical, inguinal adenopathy LUNGS:  Clear to auscultation bilaterally BACK:  No CVA tenderness CHEST:  Unremarkable HEART:  PMI not displaced or sustained,S1 and mechanical S2 within normal limits, no S3, no S4, no clicks, no rubs,  murmurs ABD:  Flat, positive bowel sounds normal in frequency in pitch, no bruits, no rebound, no guarding, no midline pulsatile mass, no hepatomegaly, no splenomegaly EXT:  2 plus pulses throughout, no edema, no cyanosis no clubbing SKIN:  No rashes no nodules NEURO:  Cranial nerves II through XII grossly intact, motor grossly intact  throughout PSYCH:  Cognitively intact, oriented to person place and time    EKG:  EKG is *** ordered today. The ekg ordered today demonstrates left bundle branch block, left axis deviation, sinus rhythm, rate ***, no change from previous.   Recent Labs: 12/21/2019: ALT 26; Hemoglobin 14.8; Platelets 161.0; TSH 2.05 03/21/2020: BUN 17; Creatinine, Ser 1.08; Potassium 4.9; Sodium 135    Lipid Panel    Component Value Date/Time   CHOL 157 12/21/2019 1517   TRIG 205.0 (H) 12/21/2019 1517   HDL 46.30 12/21/2019 1517   CHOLHDL 3 12/21/2019 1517   VLDL 41.0 (H) 12/21/2019 1517   LDLCALC 94 12/17/2018 0836   LDLDIRECT 97.0 12/21/2019 1517      Wt Readings from Last 3 Encounters:  11/10/20 240 lb (108.9 kg)  09/29/20 234 lb (106.1 kg)  06/27/20 239 lb (108.4 kg)      Other studies Reviewed: Additional studies/ records that were reviewed today include: ***. Review of the above records demonstrates:  ***   ASSESSMENT AND PLAN:  AVR:  ***  Has had no new symptoms.  He has a stable exam.  He tolerates anticoagulation.  He understands endocarditis prophylaxis.  No change in therapy or further imaging at this time.    CARDIOMYOPATHY:     ***  He had a mildly reduced but stable ejection fraction over time.  No change in therapy.  He seems to be euvolemic.  OBESITY:  *** He has lost a few pounds.  He understands need for weight loss with diet and exercise.   ABNORMAL EKG:   *** EKG is unchanged from previous.  He has no symptomatic bradycardia arrhythmias.   HTN: The blood pressure is *** elevated and has been fairly consistently.  I am going to increase his Cozaar to 100 mg daily.   DM: His A1c is was *** 8.4 but now is down to 6.7.  No change in therapy.    Current medicines are reviewed at length with the patient today.  The patient does not have concerns regarding medicines.  The following changes have been made: ***  Labs/ tests ordered today include: ***  No orders of  the defined types were placed in this encounter.    Disposition:   FU with me in ***   Signed, Minus Breeding, MD  11/28/2020 5:05 PM  [facility]

## 2020-11-30 ENCOUNTER — Ambulatory Visit: Payer: Medicare PPO | Admitting: Cardiology

## 2020-11-30 ENCOUNTER — Telehealth: Payer: Self-pay

## 2020-11-30 DIAGNOSIS — Z952 Presence of prosthetic heart valve: Secondary | ICD-10-CM

## 2020-11-30 DIAGNOSIS — I420 Dilated cardiomyopathy: Secondary | ICD-10-CM

## 2020-11-30 NOTE — Telephone Encounter (Signed)
Called patient, advised that he had missed his appointment- and we could try to get it rescheduled for this afternoon if able.   Gave call back number.

## 2020-12-01 ENCOUNTER — Ambulatory Visit: Payer: Medicare PPO

## 2020-12-17 ENCOUNTER — Other Ambulatory Visit: Payer: Self-pay | Admitting: Family Medicine

## 2020-12-17 DIAGNOSIS — E119 Type 2 diabetes mellitus without complications: Secondary | ICD-10-CM

## 2020-12-18 ENCOUNTER — Other Ambulatory Visit: Payer: Self-pay | Admitting: Cardiology

## 2020-12-18 ENCOUNTER — Other Ambulatory Visit: Payer: Self-pay | Admitting: Family Medicine

## 2020-12-26 ENCOUNTER — Other Ambulatory Visit: Payer: Medicare PPO

## 2021-01-01 ENCOUNTER — Encounter: Payer: Self-pay | Admitting: Family Medicine

## 2021-01-01 ENCOUNTER — Other Ambulatory Visit: Payer: Self-pay

## 2021-01-01 ENCOUNTER — Ambulatory Visit (INDEPENDENT_AMBULATORY_CARE_PROVIDER_SITE_OTHER): Payer: Medicare PPO | Admitting: Family Medicine

## 2021-01-01 VITALS — BP 122/80 | HR 60 | Temp 97.0°F | Ht 71.0 in | Wt 237.0 lb

## 2021-01-01 DIAGNOSIS — I4891 Unspecified atrial fibrillation: Secondary | ICD-10-CM

## 2021-01-01 DIAGNOSIS — E119 Type 2 diabetes mellitus without complications: Secondary | ICD-10-CM | POA: Diagnosis not present

## 2021-01-01 DIAGNOSIS — E785 Hyperlipidemia, unspecified: Secondary | ICD-10-CM | POA: Diagnosis not present

## 2021-01-01 DIAGNOSIS — Z0000 Encounter for general adult medical examination without abnormal findings: Secondary | ICD-10-CM

## 2021-01-01 DIAGNOSIS — Z7189 Other specified counseling: Secondary | ICD-10-CM

## 2021-01-01 DIAGNOSIS — I10 Essential (primary) hypertension: Secondary | ICD-10-CM

## 2021-01-01 DIAGNOSIS — R21 Rash and other nonspecific skin eruption: Secondary | ICD-10-CM

## 2021-01-01 LAB — LIPID PANEL
Cholesterol: 152 mg/dL (ref 0–200)
HDL: 48.6 mg/dL (ref >39.00)
LDL Cholesterol: 82 mg/dL (ref 0–99)
NonHDL: 103.03
Total CHOL/HDL Ratio: 3
Triglycerides: 103 mg/dL (ref 0.0–149.0)
VLDL: 20.6 mg/dL (ref 0.0–40.0)

## 2021-01-01 LAB — CBC WITH DIFFERENTIAL/PLATELET
Basophils Absolute: 0 10*3/uL (ref 0.0–0.1)
Basophils Relative: 0.7 % (ref 0.0–3.0)
Eosinophils Absolute: 0.3 10*3/uL (ref 0.0–0.7)
Eosinophils Relative: 5.1 % — ABNORMAL HIGH (ref 0.0–5.0)
HCT: 43.4 % (ref 39.0–52.0)
Hemoglobin: 14.9 g/dL (ref 13.0–17.0)
Lymphocytes Relative: 36.5 % (ref 12.0–46.0)
Lymphs Abs: 1.9 10*3/uL (ref 0.7–4.0)
MCHC: 34.2 g/dL (ref 30.0–36.0)
MCV: 92.5 fl (ref 78.0–100.0)
Monocytes Absolute: 0.6 10*3/uL (ref 0.1–1.0)
Monocytes Relative: 11.4 % (ref 3.0–12.0)
Neutro Abs: 2.4 10*3/uL (ref 1.4–7.7)
Neutrophils Relative %: 46.3 % (ref 43.0–77.0)
Platelets: 146 10*3/uL — ABNORMAL LOW (ref 150.0–400.0)
RBC: 4.7 Mil/uL (ref 4.22–5.81)
RDW: 14.1 % (ref 11.5–15.5)
WBC: 5.1 10*3/uL (ref 4.0–10.5)

## 2021-01-01 LAB — COMPREHENSIVE METABOLIC PANEL
ALT: 28 U/L (ref 0–53)
AST: 28 U/L (ref 0–37)
Albumin: 4.5 g/dL (ref 3.5–5.2)
Alkaline Phosphatase: 60 U/L (ref 39–117)
BUN: 13 mg/dL (ref 6–23)
CO2: 27 mEq/L (ref 19–32)
Calcium: 9.6 mg/dL (ref 8.4–10.5)
Chloride: 102 mEq/L (ref 96–112)
Creatinine, Ser: 0.96 mg/dL (ref 0.40–1.50)
GFR: 81.03 mL/min (ref >60.00)
Glucose, Bld: 135 mg/dL — ABNORMAL HIGH (ref 70–99)
Potassium: 4.6 mEq/L (ref 3.5–5.1)
Sodium: 138 mEq/L (ref 135–145)
Total Bilirubin: 1.1 mg/dL (ref 0.2–1.2)
Total Protein: 7 g/dL (ref 6.0–8.3)

## 2021-01-01 LAB — TSH: TSH: 1.64 u[IU]/mL (ref 0.35–5.50)

## 2021-01-01 LAB — HEMOGLOBIN A1C: Hgb A1c MFr Bld: 8.7 % — ABNORMAL HIGH (ref 4.6–6.5)

## 2021-01-01 NOTE — Patient Instructions (Addendum)
Go to the lab on the way out.   If you have mychart we'll likely use that to update you.    Take care.  Glad to see you. Don't change your meds yet.  Use the cream if needed and let me know if the rash isn't healing over.   Plan on recheck in about 6 months with A1c at the visit.

## 2021-01-01 NOTE — Progress Notes (Signed)
This visit occurred during the SARS-CoV-2 public health emergency.  Safety protocols were in place, including screening questions prior to the visit, additional usage of staff PPE, and extensive cleaning of exam room while observing appropriate contact time as indicated for disinfecting solutions.  I have personally reviewed the Medicare Annual Wellness questionnaire and have noted 1. The patient's medical and social history 2. Their use of alcohol, tobacco or illicit drugs 3. Their current medications and supplements 4. The patient's functional ability including ADL's, fall risks, home safety risks and hearing or visual             impairment. 5. Diet and physical activities 6. Evidence for depression or mood disorders  The patients weight, height, BMI have been recorded in the chart and visual acuity is per eye clinic.  I have made referrals, counseling and provided education to the patient based review of the above and I have provided the pt with a written personalized care plan for preventive services.  Provider list updated- see scanned forms.  Routine anticipatory guidance given to patient.  See health maintenance. The possibility exists that previously documented standard health maintenance information may have been brought forward from a previous encounter into this note.  If needed, that same information has been updated to reflect the current situation based on today's encounter.    Flu up-to-date Shingles up-to-date PNA up-to-date Tetanus 2015 COVID-vaccine previously done Colonoscopy 2013 Prostate cancer screening and PSA options (with potential risks and benefits of testing vs not testing) were discussed along with recent recs/guidelines.  He declined testing PSA at this point. Advance directive-wife designated if patient were incapacitated. Cognitive function addressed- see scanned forms- and if abnormal then additional documentation follows.   In addition to [facility]  Wellness, follow up visit for the below conditions:  Rash was getting better then returned.  B legs. Dry skin noted on the trunk.  Lidex helped, restarted in the meantime.     Diabetes:  Using medications without difficulties: yes Hypoglycemic episodes:no Hyperglycemic episodes:no Feet problems: some tingling R foot after prev injury.   Blood Sugars averaging:175-225 eye exam within last year: yes Labs pending.   Hypertension:  Using medication without problems or lightheadedness: yes Chest pain with exertion:no Edema:no Short of breath:no  Elevated Cholesterol: Using medications without problems: yes Muscle aches: no Diet compliance: d/w pt.   Exercise: d/w pt.   Still anticoagulated.  No gum bleeding, blood in urine or stool.  Compliant.    PMH and SH reviewed  Meds, vitals, and allergies reviewed.   ROS: Per HPI.  Unless specifically indicated otherwise in HPI, the patient denies:  General: fever. Eyes: acute vision changes ENT: sore throat Cardiovascular: chest pain Respiratory: SOB GI: vomiting GU: dysuria Musculoskeletal: acute back pain Derm: acute rash Neuro: acute motor dysfunction Psych: worsening mood Endocrine: polydipsia Heme: bleeding Allergy: hayfever  GEN: nad, alert and oriented HEENT: ncat NECK: supple w/o LA CV: IRR, not tachy, click note at baseline.   PULM: ctab, no inc wob ABD: soft, +bs EXT: no edema SKIN: faint maculopapular rash on the R thigh noted. No ulceration.    Diabetic foot exam: Normal inspection No skin breakdown No calluses  Normal DP pulses Normal sensation to light touch and monofilament Nails normal

## 2021-01-04 NOTE — Assessment & Plan Note (Signed)
Continue glipizide and metformin.  See notes on labs.

## 2021-01-04 NOTE — Assessment & Plan Note (Signed)
Continue pravastatin.  See notes on labs. 

## 2021-01-04 NOTE — Assessment & Plan Note (Signed)
History of.  Continue anticoagulation.  No bleeding.  See notes on labs.

## 2021-01-04 NOTE — Assessment & Plan Note (Signed)
Flu up-to-date Shingles up-to-date PNA up-to-date Tetanus 2015 COVID-vaccine previously done Colonoscopy 2013 Prostate cancer screening and PSA options (with potential risks and benefits of testing vs not testing) were discussed along with recent recs/guidelines.  He declined testing PSA at this point. Advance directive-wife designated if patient were incapacitated. Cognitive function addressed- see scanned forms- and if abnormal then additional documentation follows.

## 2021-01-04 NOTE — Assessment & Plan Note (Signed)
Advance directive- wife designated if patient were incapacitated.  

## 2021-01-04 NOTE — Assessment & Plan Note (Signed)
Improving with Lidex.  Continue as needed use for now and update me if it does not fully resolve.

## 2021-01-04 NOTE — Assessment & Plan Note (Signed)
Continue carvedilol.  Continue potassium and Lasix.  Continue losartan.  See notes on labs.

## 2021-01-05 ENCOUNTER — Ambulatory Visit: Payer: Medicare PPO

## 2021-01-05 ENCOUNTER — Other Ambulatory Visit: Payer: Self-pay

## 2021-01-05 DIAGNOSIS — Z7901 Long term (current) use of anticoagulants: Secondary | ICD-10-CM

## 2021-01-05 DIAGNOSIS — I4891 Unspecified atrial fibrillation: Secondary | ICD-10-CM

## 2021-01-05 DIAGNOSIS — Z952 Presence of prosthetic heart valve: Secondary | ICD-10-CM

## 2021-01-05 LAB — POCT INR: INR: 2.4 (ref 2.0–3.0)

## 2021-01-05 NOTE — Progress Notes (Signed)
Continue to take 4 mg daily except 2 mg on Monday and Fridays.  Recheck in 6 weeks.

## 2021-01-05 NOTE — Patient Instructions (Addendum)
Pre visit review using our clinic review tool, if applicable. No additional management support is needed unless otherwise documented below in the visit note.  Continue to take 4 mg daily except 2 mg on Monday and Fridays.  Recheck in 6 weeks.

## 2021-01-12 ENCOUNTER — Other Ambulatory Visit: Payer: Self-pay | Admitting: Family Medicine

## 2021-01-14 NOTE — Progress Notes (Signed)
   Cardiology Office Note   Date:  01/16/2021   ID:  [name] [name], DOB 12/24/1950, MRN [id number]  PCP:  Duncan, Graham S, MD  Cardiologist:   James Hochrein, MD   No chief complaint on file.     History of Present Illness: [name] [name] is a 70 y.o. male who presents for follow up after aortic valve replacement.  Since I last saw him he has done okay.  The patient denies any new symptoms such as chest discomfort, neck or arm discomfort. There has been no new shortness of breath, PND or orthopnea. There have been no reported palpitations, presyncope or syncope.  He has had some leg numbness but he had some trauma falling through a hole in the floor. he has no new symptoms.    Past Medical History:  Diagnosis Date   Anemia    Aortic insufficiency    with bicuspid aortic valve (the last MRI demonstrated the aortic root to be 4.8 x 4.7 cm.) Mod regurgitation. Normal chamber size with very mild left ventricular dysfunction.)   Blood transfusion    CHF (congestive heart failure) (HCC)    Cough 06/28/2009   Dr. 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   Pericardial effusion 07/2009   admission to [facility], s/p pericardial window   Pleural effusion 10/2009   s/p thoracentesis   PNA (pneumonia) 10/2009   admnission to [facility], with right pleural effusion, s/p thoracentesis   Syncope 08/2009   admission for fever and syncope, no source for fever seen on cultures. Syncope thought to be r/t orthostasis    Past Surgical History:  Procedure Laterality Date   CARDIAC VALVE REPLACEMENT  07/10/2009   AVR   Cardiolite EKG   03/10/2003   (-) ? small/apical infarct EF 50%   DOPPLER ECHOCARDIOGRAPHY  01/18/97   LVH, mild dilated aorta - root mod to severe aortic regurg EF 50-55%   DOPPLER ECHOCARDIOGRAPHY  09/27/03   No changes, moderate severe aortic regurg   DOPPLER ECHOCARDIOGRAPHY  07/09/2004   Mod severe aortic regurg 2-3+ T.R., T.I.R., mild P..R.   DOPPLER ECHOCARDIOGRAPHY  04/01/06   Hypokin Apex EF 55%   DOPPLER ECHOCARDIOGRAPHY  02/15/2010   Mild LVH, mild decr Sys fctn EF 40-45% Mild Dias Dysfctn Mech AV Triv AR, MR   ESOPHAGOGASTRODUODENOSCOPY  1990, 1999   Hot dog in throat (1987)/ Chicken in throat (1990)/ 01/06/97 (Dr. Medoff) Impaction, dilatation, Schatzki's Ring/ 07/1998 repeat Dilatation/ 01/10/01 stricture/dilated/ 03/08/05 Schatzki's Ring, dilated/ 06/01/08 Food Disimpaction [name] Bulb Ulcer (Dr. Magod)   ESOPHAGOGASTRODUODENOSCOPY  01/06/1997   (Dr. Medoff) Impaction, dilatation, Schatzki's Ring   ESOPHAGOGASTRODUODENOSCOPY  08/09/98   Repeat dilatation ([facility]plan)   ESOPHAGOGASTRODUODENOSCOPY  01/13/2001   stricture/ HH, duodenal ulcer / bleeding ([facility])   ESOPHAGOGASTRODUODENOSCOPY  12/25/01   esophagus stricture, dilated   ESOPHAGOGASTRODUODENOSCOPY  03/23/2001   stricture, dilated   ESOPHAGOGASTRODUODENOSCOPY  03/08/05   Schatzki's Ring   03/13/05  Schatzki's Ring - dilated, duodenitis   ESOPHAGOGASTRODUODENOSCOPY  06/11/08   with food disimpaction.  Food impaction Ms HH  Sm   bulb ulcer (Dr. Watt Climes)   ESOPHAGOGASTRODUODENOSCOPY     stricture/HH, duodenal ulcer/bleeding Henrene Pastor)   ESOPHAGOGASTRODUODENOSCOPY N/A 01/23/2016   Procedure: ESOPHAGOGASTRODUODENOSCOPY (EGD);  Surgeon: [name], MD;  Location: [facility] ENDOSCOPY;  Service: Endoscopy;  Laterality: N/A;   ESOPHAGOGASTRODUODENOSCOPY (EGD) WITH PROPOFOL N/A 02/06/2016   Procedure: ESOPHAGOGASTRODUODENOSCOPY (EGD) WITH PROPOFOL;  Surgeon: [name], MD;  Location: [facility] ENDOSCOPY;  Service: Endoscopy;  Laterality: N/A;   HERNIA REPAIR  2011    abdominal; post AVR   MRI  2/05   LVH global hypokin EF 48% Mod A.I.   VALVE REPLACEMENT  06/2009   St. Jude mechanical valve per Dr. Cyndia Bent     Current Outpatient Medications  Medication Sig Dispense Refill   ACCU-CHEK AVIVA PLUS test strip USE AS DIRECTED TO CHECK BLOOD SUGAR 2 TO 4 TIMES A DAY 100 each 11   Accu-Chek FastClix Lancets MISC USE AS DIRECTED TO CHECK BLOOD SUGAR TWICE DAILY 102 each 4   amoxicillin (AMOXIL) 500 MG tablet Take 2,000 mg by mouth once. Before dental visits.     azelastine (ASTELIN) 0.1 % nasal spray Place 2 sprays into both nostrils 2 (two) times daily as needed for rhinitis. Use in each nostril as directed 30 mL 12   carvedilol (COREG) 6.25 MG tablet TAKE 1 TABLET BY MOUTH TWICE A DAY. KEEPOFFICE VISIT 180 tablet 0   cetirizine (ZYRTEC) 10 MG tablet Take 10 mg by mouth daily.     fluocinonide cream (LIDEX) 4.74 % APPLY 1 APPLICATION TO AFFECTEDE AREAS 2 TIMES DAILY AS NEEDED 30 g 1   furosemide (LASIX) 20 MG tablet TAKE 1 TABLET BY MOUTH ONCE DAILY 90 tablet 2   glipiZIDE (GLUCOTROL) 5 MG tablet TAKE 2 TABLES BY MOUTH DAILY BEFORE BREAKFAST IF AM SUGAR IS ABOVE 150 180 tablet 1   Lancets Misc. (ACCU-CHEK FASTCLIX LANCET) KIT AS DIRECTED 1 kit 0   losartan (COZAAR) 100 MG tablet TAKE 1 TABLET BY MOUTH ONCE A DAY (PLEASE SCHEDULE APPT) 90 tablet 0   metFORMIN (GLUCOPHAGE) 500 MG tablet TAKE 2 TABLETS BY MOUTH TWICE A DAY 360 tablet 3   potassium chloride (KLOR-CON) 10 MEQ tablet TAKE 1 TABLET BY MOUTH TWICE A DAY 180 tablet 3   pravastatin (PRAVACHOL) 20 MG tablet TAKE 1 TABLET BY MOUTH ONCE A DAY 90 tablet 0   psyllium (METAMUCIL) 58.6 % packet Take 1 packet by mouth daily.     warfarin (COUMADIN) 1 MG tablet TAKE ON TABLET BY MOUTH DAILY EXCEPT TAKE 2 TABLETS ON MONDAYS AND FRIDAYS OR AS DIRECTED BY ANTICOAGULATION CLINIC (ALSO TAKE 3MG DOSE TABLETS AS DIRECTED BY ANTICOAGULATION CLINIC) 120 tablet 2   warfarin (COUMADIN) 3 MG tablet TAKE 1 TABLET BY MOUTH ON  SUNDAYS, TUESDAYS, WEDNESDAYS, AND SATURDAYS OR AS DIRECTED BY ANTICOAGULATION CINIC 70 tablet 0   No current facility-administered medications for this visit.    Allergies:   Ace inhibitors, Aspirin, Other, Primaxin [imipenem w/cilastatin sodium], and Tape     ROS:  Please see the history of present illness.   Otherwise, review of systems are positive for none.   All other systems are reviewed and negative.    PHYSICAL EXAM: VS:  BP (!) 168/77   Pulse 61   Ht 5' 11" (1.803 m)   Wt 236 lb (107 kg)   SpO2 98%   BMI 32.92 kg/m  , BMI Body mass index is 32.92 kg/m. GENERAL:  Well appearing NECK:  No jugular venous distention, waveform within normal limits, carotid  upstroke brisk and symmetric, no bruits, no thyromegaly LUNGS:  Clear to auscultation bilaterally CHEST:  Well healed sternotomy scar. HEART:  PMI not displaced or sustained,S1 and mechanical S2 within normal limits, no S3, no S4, no clicks, no rubs, no murmurs ABD:  Flat, positive bowel sounds normal in frequency in pitch, no bruits, no rebound, no guarding, no midline pulsatile mass, no hepatomegaly, no splenomegaly EXT:  2 plus pulses throughout, no edema, no cyanosis no clubbing   EKG:  EKG is  ordered today. The ekg ordered today demonstrates left bundle branch block, left axis deviation, sinus rhythm, rate 61, no change from previous.   Recent Labs: 01/01/2021: ALT 28; BUN 13; Creatinine, Ser 0.96; Hemoglobin 14.9; Platelets 146.0; Potassium 4.6; Sodium 138; TSH 1.64    Lipid Panel    Component Value Date/Time   CHOL 152 01/01/2021 1151   TRIG 103.0 01/01/2021 1151   HDL 48.60 01/01/2021 1151   CHOLHDL 3 01/01/2021 1151   VLDL 20.6 01/01/2021 1151   LDLCALC 82 01/01/2021 1151   LDLDIRECT 97.0 12/21/2019 1517      Wt Readings from Last 3 Encounters:  01/16/21 236 lb (107 kg)  01/01/21 237 lb (107.5 kg)  11/10/20 240 lb (108.9 kg)      Other studies Reviewed: Additional studies/ records that were  reviewed today include: Labs. Review of the above records demonstrates:  Please see elsewhere in the note.     ASSESSMENT AND PLAN:  AVR: He is done well but I am very check a follow-up echocardiogram as it has been over 4 years.  He tolerates his anticoagulation and understands endocarditis prophylaxis.   CARDIOMYOPATHY:   I will evaluate this as above.  OBESITY:   He has lost a few pounds and I encouraged more of the same.    HTN: The blood pressure is elevated but he says it is in the 130s at home and he will people record on this.  I do not see this being elevated like this previously.   DM: His A1c is was up to 8.7 which is up from previous of 8.4.  He is going to control his diet to manage this.    LBBB: This is chronic   LEG PAIN:  This does not appear to be vascular.  No further cardiovascular work-up.  current medicines are reviewed at length with the patient today.  The patient does not have concerns regarding medicines.  The following changes have been made: None  Labs/ tests ordered today include: None  No orders of the defined types were placed in this encounter.    Disposition:   FU with me in one year.     Signed, Minus Breeding, MD  01/16/2021 3:35 PM    [facility]

## 2021-01-16 ENCOUNTER — Encounter: Payer: Self-pay | Admitting: Cardiology

## 2021-01-16 ENCOUNTER — Other Ambulatory Visit: Payer: Self-pay

## 2021-01-16 ENCOUNTER — Ambulatory Visit: Payer: Medicare PPO | Admitting: Cardiology

## 2021-01-16 VITALS — BP 168/77 | HR 61 | Ht 71.0 in | Wt 236.0 lb

## 2021-01-16 DIAGNOSIS — I420 Dilated cardiomyopathy: Secondary | ICD-10-CM

## 2021-01-16 DIAGNOSIS — E118 Type 2 diabetes mellitus with unspecified complications: Secondary | ICD-10-CM

## 2021-01-16 DIAGNOSIS — Z952 Presence of prosthetic heart valve: Secondary | ICD-10-CM | POA: Diagnosis not present

## 2021-01-16 DIAGNOSIS — I10 Essential (primary) hypertension: Secondary | ICD-10-CM | POA: Diagnosis not present

## 2021-01-16 NOTE — Patient Instructions (Signed)
Medication Instructions:  Your Physician recommend you continue on your current medication as directed.    *If you need a refill on your cardiac medications before your next appointment, please call your pharmacy*   Testing/Procedures: Your physician has requested that you have an echocardiogram. Echocardiography is a painless test that uses sound waves to create images of your heart. It provides your doctor with information about the size and shape of your heart and how well your hearts chambers and valves are working. This procedure takes approximately one hour. There are no restrictions for this procedure.   Follow-Up: At [facility], you and your health needs are our priority.  As part of our continuing mission to provide you with exceptional heart care, we have created designated Provider Care Teams.  These Care Teams include your primary Cardiologist (physician) and Advanced Practice Providers (APPs -  Physician Assistants and Nurse Practitioners) who all work together to provide you with the care you need, when you need it.  We recommend signing up for the patient portal called "MyChart".  Sign up information is provided on this After Visit Summary.  MyChart is used to connect with patients for Virtual Visits (Telemedicine).  Patients are able to view lab/test results, encounter notes, upcoming appointments, etc.  Non-urgent messages can be sent to your provider as well.   To learn more about what you can do with MyChart, go to [email or web address].    Your next appointment:   12 month(s)  The format for your next appointment:   In Person  Provider:   Minus Breeding, MD

## 2021-01-30 ENCOUNTER — Telehealth: Payer: Self-pay

## 2021-01-30 NOTE — Telephone Encounter (Signed)
Chrismon, Ashley L routed conversation to You 1 hour ago (9:11 AM)   [name]  [facility] Support Pool (supporting Mychart, Generic) 3 days ago   have to reschedule appointment. Will be in [place] to see Heart Doctor. call [phone] or [phone] to let me know of new appointment    Mychart, Generic  [name] 3 weeks ago   GM Appointment Information:     Visit Type: COUMADIN CLINIC         Date: 02/16/2021                 Dept: [place] [facility]                 Provider: [facility]                 Time: 8:00 AM                 Length: 15 min   Appt Status: Scheduled    Tried to contact pt or his wife, Danton Clap, to [place] apt to /20 at 8 am or 1045 am. If neither of those work we can look at another day. LVM

## 2021-01-30 NOTE — Telephone Encounter (Signed)
Pt returned call and will take apt on 1/13 at 8 am.  RS apt

## 2021-01-30 NOTE — Telephone Encounter (Signed)
Pt returned call. RS apt for 1/13 at 8am.

## 2021-02-05 ENCOUNTER — Other Ambulatory Visit: Payer: Self-pay | Admitting: Family Medicine

## 2021-02-05 DIAGNOSIS — Z7901 Long term (current) use of anticoagulants: Secondary | ICD-10-CM

## 2021-02-05 NOTE — Telephone Encounter (Signed)
Pt is complaint with warfarin management and PCP apts. Sent in refill.

## 2021-02-09 ENCOUNTER — Ambulatory Visit: Payer: Medicare PPO

## 2021-02-09 ENCOUNTER — Other Ambulatory Visit: Payer: Self-pay

## 2021-02-09 DIAGNOSIS — I4891 Unspecified atrial fibrillation: Secondary | ICD-10-CM

## 2021-02-09 DIAGNOSIS — Z7901 Long term (current) use of anticoagulants: Secondary | ICD-10-CM | POA: Diagnosis not present

## 2021-02-09 DIAGNOSIS — Z952 Presence of prosthetic heart valve: Secondary | ICD-10-CM

## 2021-02-09 LAB — POCT INR: INR: 1.9 — AB (ref 2.0–3.0)

## 2021-02-09 NOTE — Progress Notes (Signed)
Increase dose today to take 3 mg and then continue to take 4 mg daily except 2 mg on Monday and Fridays.  Recheck in 6 weeks.

## 2021-02-09 NOTE — Patient Instructions (Addendum)
Pre visit review using our clinic review tool, if applicable. No additional management support is needed unless otherwise documented below in the visit note.  Increase dose today to take 3 mg and then continue to take 4 mg daily except 2 mg on Monday and Fridays.  Recheck in 6 weeks.

## 2021-02-16 ENCOUNTER — Ambulatory Visit (HOSPITAL_COMMUNITY): Payer: Medicare PPO | Attending: Cardiovascular Disease

## 2021-02-16 ENCOUNTER — Ambulatory Visit: Payer: Medicare PPO

## 2021-02-16 ENCOUNTER — Other Ambulatory Visit: Payer: Self-pay

## 2021-02-16 DIAGNOSIS — Z952 Presence of prosthetic heart valve: Secondary | ICD-10-CM | POA: Insufficient documentation

## 2021-02-16 DIAGNOSIS — E118 Type 2 diabetes mellitus with unspecified complications: Secondary | ICD-10-CM | POA: Insufficient documentation

## 2021-02-16 DIAGNOSIS — I420 Dilated cardiomyopathy: Secondary | ICD-10-CM | POA: Diagnosis not present

## 2021-02-16 DIAGNOSIS — I10 Essential (primary) hypertension: Secondary | ICD-10-CM | POA: Insufficient documentation

## 2021-02-16 LAB — ECHOCARDIOGRAM COMPLETE
AR max vel: 2.35 cm2
AV Area VTI: 2.8 cm2
AV Area mean vel: 2.37 cm2
AV Mean grad: 8 mmHg
AV Peak grad: 15.7 mmHg
Ao pk vel: 1.98 m/s
Area-P 1/2: 1.93 cm2
S' Lateral: 3.8 cm

## 2021-02-23 ENCOUNTER — Encounter: Payer: Self-pay | Admitting: *Deleted

## 2021-02-26 ENCOUNTER — Other Ambulatory Visit: Payer: Self-pay | Admitting: Cardiology

## 2021-03-10 ENCOUNTER — Emergency Department (HOSPITAL_COMMUNITY): Payer: Medicare PPO | Admitting: Certified Registered Nurse Anesthetist

## 2021-03-10 ENCOUNTER — Encounter (HOSPITAL_COMMUNITY)
Admission: EM | Disposition: A | Discharge status: Home / Self Care | Payer: Self-pay | Source: Home / Self Care | Attending: Emergency Medicine

## 2021-03-10 ENCOUNTER — Encounter (HOSPITAL_COMMUNITY): Payer: Self-pay

## 2021-03-10 ENCOUNTER — Other Ambulatory Visit: Payer: Self-pay

## 2021-03-10 ENCOUNTER — Emergency Department (HOSPITAL_BASED_OUTPATIENT_CLINIC_OR_DEPARTMENT_OTHER): Payer: Medicare PPO | Admitting: Certified Registered Nurse Anesthetist

## 2021-03-10 ENCOUNTER — Ambulatory Visit (HOSPITAL_COMMUNITY)
Admission: EM | Admit: 2021-03-10 | Discharge: 2021-03-10 | Disposition: A | Discharge status: Home / Self Care | Payer: Medicare PPO | Attending: Emergency Medicine | Admitting: Emergency Medicine

## 2021-03-10 DIAGNOSIS — I509 Heart failure, unspecified: Secondary | ICD-10-CM | POA: Insufficient documentation

## 2021-03-10 DIAGNOSIS — Z7984 Long term (current) use of oral hypoglycemic drugs: Secondary | ICD-10-CM | POA: Diagnosis not present

## 2021-03-10 DIAGNOSIS — K2100 Gastro-esophageal reflux disease with esophagitis, without bleeding: Secondary | ICD-10-CM | POA: Diagnosis not present

## 2021-03-10 DIAGNOSIS — K269 Duodenal ulcer, unspecified as acute or chronic, without hemorrhage or perforation: Secondary | ICD-10-CM | POA: Diagnosis not present

## 2021-03-10 DIAGNOSIS — K3189 Other diseases of stomach and duodenum: Secondary | ICD-10-CM | POA: Insufficient documentation

## 2021-03-10 DIAGNOSIS — E119 Type 2 diabetes mellitus without complications: Secondary | ICD-10-CM | POA: Insufficient documentation

## 2021-03-10 DIAGNOSIS — R0989 Other specified symptoms and signs involving the circulatory and respiratory systems: Secondary | ICD-10-CM

## 2021-03-10 DIAGNOSIS — K222 Esophageal obstruction: Secondary | ICD-10-CM

## 2021-03-10 DIAGNOSIS — W44F3XA Food entering into or through a natural orifice, initial encounter: Secondary | ICD-10-CM

## 2021-03-10 DIAGNOSIS — Z20822 Contact with and (suspected) exposure to covid-19: Secondary | ICD-10-CM | POA: Insufficient documentation

## 2021-03-10 DIAGNOSIS — T18128A Food in esophagus causing other injury, initial encounter: Secondary | ICD-10-CM | POA: Insufficient documentation

## 2021-03-10 DIAGNOSIS — I110 Hypertensive heart disease with heart failure: Secondary | ICD-10-CM | POA: Diagnosis not present

## 2021-03-10 DIAGNOSIS — X58XXXA Exposure to other specified factors, initial encounter: Secondary | ICD-10-CM | POA: Diagnosis not present

## 2021-03-10 HISTORY — DX: Sleep apnea, unspecified: G47.30

## 2021-03-10 HISTORY — PX: ESOPHAGOGASTRODUODENOSCOPY: SHX5428

## 2021-03-10 HISTORY — PX: FOREIGN BODY REMOVAL: SHX962

## 2021-03-10 LAB — BASIC METABOLIC PANEL
Anion gap: 11 (ref 5–15)
BUN: 19 mg/dL (ref 8–23)
CO2: 23 mmol/L (ref 22–32)
Calcium: 9.6 mg/dL (ref 8.9–10.3)
Chloride: 105 mmol/L (ref 98–111)
Creatinine, Ser: 0.9 mg/dL (ref 0.61–1.24)
GFR, Estimated: >60 mL/min (ref >60)
Glucose, Bld: 108 mg/dL — ABNORMAL HIGH (ref 70–99)
Potassium: 4.1 mmol/L (ref 3.5–5.1)
Sodium: 139 mmol/L (ref 135–145)

## 2021-03-10 LAB — CBC WITH DIFFERENTIAL/PLATELET
Abs Immature Granulocytes: 0.01 10*3/uL (ref 0.00–0.07)
Basophils Absolute: 0.1 10*3/uL (ref 0.0–0.1)
Basophils Relative: 1 %
Eosinophils Absolute: 0.2 10*3/uL (ref 0.0–0.5)
Eosinophils Relative: 3 %
HCT: 44.7 % (ref 39.0–52.0)
Hemoglobin: 15.6 g/dL (ref 13.0–17.0)
Immature Granulocytes: 0 %
Lymphocytes Relative: 30 %
Lymphs Abs: 2 10*3/uL (ref 0.7–4.0)
MCH: 31.9 pg (ref 26.0–34.0)
MCHC: 34.9 g/dL (ref 30.0–36.0)
MCV: 91.4 fL (ref 80.0–100.0)
Monocytes Absolute: 0.7 10*3/uL (ref 0.1–1.0)
Monocytes Relative: 11 %
Neutro Abs: 3.8 10*3/uL (ref 1.7–7.7)
Neutrophils Relative %: 55 %
Platelets: 160 10*3/uL (ref 150–400)
RBC: 4.89 MIL/uL (ref 4.22–5.81)
RDW: 13.5 % (ref 11.5–15.5)
WBC: 6.8 10*3/uL (ref 4.0–10.5)
nRBC: 0 % (ref 0.0–0.2)

## 2021-03-10 LAB — PROTIME-INR
INR: 2.4 — ABNORMAL HIGH (ref 0.8–1.2)
Prothrombin Time: 26.1 seconds — ABNORMAL HIGH (ref 11.4–15.2)

## 2021-03-10 LAB — RESP PANEL BY RT-PCR (FLU A&B, COVID) ARPGX2
Influenza A by PCR: NEGATIVE
Influenza B by PCR: NEGATIVE
SARS Coronavirus 2 by RT PCR: NEGATIVE

## 2021-03-10 SURGERY — EGD (ESOPHAGOGASTRODUODENOSCOPY)
Anesthesia: General

## 2021-03-10 MED ORDER — SUCCINYLCHOLINE CHLORIDE 200 MG/10ML IV SOSY
PREFILLED_SYRINGE | INTRAVENOUS | Status: AC
  Filled 2021-03-10: qty 10

## 2021-03-10 MED ORDER — DEXAMETHASONE SODIUM PHOSPHATE 10 MG/ML IJ SOLN
INTRAMUSCULAR | Status: DC | PRN
  Administered 2021-03-10: 5 mg via INTRAVENOUS

## 2021-03-10 MED ORDER — ROCURONIUM BROMIDE 10 MG/ML (PF) SYRINGE
PREFILLED_SYRINGE | INTRAVENOUS | Status: DC | PRN
Start: 2021-03-10 — End: 2021-03-10
  Administered 2021-03-10: 20 mg via INTRAVENOUS

## 2021-03-10 MED ORDER — SODIUM CHLORIDE 0.9 % IV SOLN: INTRAVENOUS | Status: DC

## 2021-03-10 MED ORDER — ONDANSETRON HCL 4 MG/2ML IJ SOLN
INTRAMUSCULAR | Status: DC | PRN
  Administered 2021-03-10: 4 mg via INTRAVENOUS

## 2021-03-10 MED ORDER — PHENYLEPHRINE 40 MCG/ML (10ML) SYRINGE FOR IV PUSH (FOR BLOOD PRESSURE SUPPORT)
PREFILLED_SYRINGE | INTRAVENOUS | Status: DC | PRN
  Administered 2021-03-10: 160 ug via INTRAVENOUS
  Administered 2021-03-10: 60 ug via INTRAVENOUS
  Administered 2021-03-10: 100 ug via INTRAVENOUS

## 2021-03-10 MED ORDER — NITROGLYCERIN 0.4 MG SL SUBL
0.4000 mg | SUBLINGUAL_TABLET | Freq: Once | SUBLINGUAL | Status: AC
  Administered 2021-03-10: 0.4 mg via SUBLINGUAL
  Filled 2021-03-10: qty 1

## 2021-03-10 MED ORDER — LACTATED RINGERS IV SOLN: INTRAVENOUS | Status: DC

## 2021-03-10 MED ORDER — PROPOFOL 10 MG/ML IV BOLUS
INTRAVENOUS | Status: DC | PRN
  Administered 2021-03-10: 160 mg via INTRAVENOUS

## 2021-03-10 MED ORDER — SUGAMMADEX SODIUM 200 MG/2ML IV SOLN
INTRAVENOUS | Status: DC | PRN
  Administered 2021-03-10: 100 mg via INTRAVENOUS

## 2021-03-10 MED ORDER — LIDOCAINE HCL (PF) 2 % IJ SOLN
INTRAMUSCULAR | Status: AC
  Filled 2021-03-10: qty 5

## 2021-03-10 MED ORDER — PROPOFOL 10 MG/ML IV BOLUS
INTRAVENOUS | Status: AC
  Filled 2021-03-10: qty 20

## 2021-03-10 MED ORDER — LIDOCAINE HCL (CARDIAC) PF 100 MG/5ML IV SOSY
PREFILLED_SYRINGE | INTRAVENOUS | Status: DC | PRN
  Administered 2021-03-10: 100 mg via INTRAVENOUS

## 2021-03-10 MED ORDER — SUCCINYLCHOLINE CHLORIDE 200 MG/10ML IV SOSY
PREFILLED_SYRINGE | INTRAVENOUS | Status: DC | PRN
Start: 2021-03-10 — End: 2021-03-10
  Administered 2021-03-10: 140 mg via INTRAVENOUS

## 2021-03-10 MED ORDER — FENTANYL CITRATE (PF) 100 MCG/2ML IJ SOLN
INTRAMUSCULAR | Status: AC
  Filled 2021-03-10: qty 2

## 2021-03-10 MED ORDER — OMEPRAZOLE MAGNESIUM 20 MG PO TBEC
20.0000 mg | DELAYED_RELEASE_TABLET | Freq: Every day | ORAL | 3 refills | Status: DC
Start: 2021-03-10 — End: 2022-07-22

## 2021-03-10 MED ORDER — FENTANYL CITRATE (PF) 100 MCG/2ML IJ SOLN: 25.0000 ug | INTRAMUSCULAR | Status: DC | PRN

## 2021-03-10 MED ORDER — FENTANYL CITRATE (PF) 100 MCG/2ML IJ SOLN
INTRAMUSCULAR | Status: DC | PRN
  Administered 2021-03-10: 100 ug via INTRAVENOUS

## 2021-03-10 NOTE — Discharge Instructions (Signed)
YOU HAD AN ENDOSCOPIC PROCEDURE TODAY: Refer to the procedure report and other information in the discharge instructions given to you for any specific questions about what was found during the examination. If this information does not answer your questions, please call [place] office at [phone] to clarify.  ° °YOU SHOULD EXPECT: Some feelings of bloating in the abdomen. Passage of more gas than usual. Walking can help get rid of the air that was put into your GI tract during the procedure and reduce the bloating.  ° °DIET: Your first meal following the procedure should be a light meal and then it is ok to progress to your normal diet. A half-sandwich or bowl of soup is an example of a good first meal. Heavy or fried foods are harder to digest and may make you feel nauseous or bloated. Drink plenty of fluids but you should avoid alcoholic beverages for 24 hours. ° °ACTIVITY: Your care partner should take you home directly after the procedure. You should plan to take it easy, moving slowly for the rest of the day. You can resume normal activity the day after the procedure however YOU SHOULD NOT DRIVE, use power tools, machinery or perform tasks that involve climbing or major physical exertion for 24 hours (because of the sedation medicines used during the test).  ° °SYMPTOMS TO REPORT IMMEDIATELY: °A gastroenterologist can be reached at any hour. Please call [phone]  for any of the following symptoms:  ° °Following upper endoscopy (EGD, EUS, ERCP, esophageal dilation) °Vomiting of blood or coffee ground material  °New, significant abdominal pain  °New, significant chest pain or pain under the shoulder blades  °Painful or persistently difficult swallowing  °New shortness of breath  °Black, tarry-looking or red, bloody stools ° °FOLLOW UP:  °If any biopsies were taken you will be contacted by phone or by letter within the next 1-3 weeks. Call [phone]  if you have not heard about the biopsies in 3 weeks.    °Please also call with any specific questions about appointments or follow up tests. ° °

## 2021-03-10 NOTE — ED Provider Triage Note (Addendum)
Emergency Medicine Provider Triage Evaluation Note  [name] [name] , a 71 y.o. male  was evaluated in triage.  Pt complains of food bolus sensation after eating ham earlier today. Has been drinking coke with no relief of symptoms. Hx of same at least 10 times. Only thing that has worked other than EGD is nitroglycerin dissolved in water. Hx of schatzki ring in past.  Review of Systems  Positive: dysphagia Negative: Stridor, SOB  Physical Exam  BP (!) 185/88 (BP Location: Left Arm)    Pulse (!) 58    Temp 98.3 F (36.8 C) (Oral)    Resp 18    SpO2 95%  Gen:   Awake, no distress   Resp:  Normal effort  MSK:   Moves extremities without difficulty  Other:  Clear breath sounds bilaterally, no stridor, no distress  Medical Decision Making  Medically screening exam initiated at 5:41 PM.  Appropriate orders placed.  [name] [name] was informed that the remainder of the evaluation will be completed by another provider, this initial triage assessment does not replace that evaluation, and the importance of remaining in the ED until their evaluation is complete.  Food bolus, hx of same   [name] [name] 03/10/21 1743  Spoke with [name] [name] who requests consult with [name] [name] as patient has seen them in the past. Last visit in 2018. Consult placed to [name] [name]  0.4 mg SL nitro dissolve in 22mL water   Secretary informed me that no one seems to be on call for  [name] at this time -- that the call would default to [name] [name] at this time if we cannot locate the on-call for [name] [name] at this time    [name] [name], [name] [name] 03/10/21 1808

## 2021-03-10 NOTE — Anesthesia Preprocedure Evaluation (Addendum)
Anesthesia Evaluation  Patient identified by MRN, date of birth, ID band Patient awake    Reviewed: Allergy & Precautions, NPO status , Patient's Chart, lab work & pertinent test results, reviewed documented beta blocker date and time   History of Anesthesia Complications Negative for: history of anesthetic complications  Airway Mallampati: II  TM Distance: >3 FB Neck ROM: Full    Dental  (+) Missing,    Pulmonary sleep apnea ,    Pulmonary exam normal        Cardiovascular hypertension, Pt. on medications and Pt. on home beta blockers +CHF  Normal cardiovascular exam  TTE 02/16/21: EF 45-50%, moderate LVE, mild LVH, moderate LAE, mild MR, s/p mechanical AVR    Neuro/Psych negative neurological ROS  negative psych ROS   GI/Hepatic Neg liver ROS, PUD, GERD  Medicated,Schatzki ring with numerous prior esophageal food impactions   Endo/Other  diabetes, Type 2, Oral Hypoglycemic Agents  Renal/GU negative Renal ROS  negative genitourinary   Musculoskeletal negative musculoskeletal ROS (+)   Abdominal   Peds  Hematology negative hematology ROS (+)   Anesthesia Other Findings Day of surgery medications reviewed with patient.  Reproductive/Obstetrics negative OB ROS                            Anesthesia Physical Anesthesia Plan  ASA: 3 and emergent  Anesthesia Plan: General   Post-op Pain Management: Minimal or no pain anticipated   Induction: Intravenous and Rapid sequence  PONV Risk Score and Plan: 2 and Treatment may vary due to age or medical condition, Ondansetron and Dexamethasone  Airway Management Planned: Oral ETT and Video Laryngoscope Planned  Additional Equipment: None  Intra-op Plan:   Post-operative Plan: Extubation in OR  Informed Consent: I have reviewed the patients History and Physical, chart, labs and discussed the procedure including the risks, benefits and  alternatives for the proposed anesthesia with the patient or authorized representative who has indicated his/her understanding and acceptance.     Dental advisory given  Plan Discussed with: CRNA  Anesthesia Plan Comments:        Anesthesia Quick Evaluation

## 2021-03-10 NOTE — ED Notes (Signed)
Pt drinking diet coke now.

## 2021-03-10 NOTE — Anesthesia Procedure Notes (Addendum)
Procedure Name: Intubation Date/Time: 03/10/2021 10:00 PM Performed by: Raenette Rover, CRNA Pre-anesthesia Checklist: Patient identified, Emergency Drugs available, Suction available and Patient being monitored Patient Re-evaluated:Patient Re-evaluated prior to induction Oxygen Delivery Method: Circle system utilized Preoxygenation: Pre-oxygenation with 100% oxygen Induction Type: IV induction and Rapid sequence Ventilation: Mask ventilation without difficulty Laryngoscope Size: Glidescope and 4 Grade View: Grade I Tube type: Oral Tube size: 7.5 mm Number of attempts: 1 Airway Equipment and Method: Stylet and Video-laryngoscopy Placement Confirmation: ETT inserted through vocal cords under direct vision, positive ETCO2 and breath sounds checked- equal and bilateral Secured at: 23 cm Tube secured with: Tape Dental Injury: Teeth and Oropharynx as per pre-operative assessment

## 2021-03-10 NOTE — Transfer of Care (Signed)
Immediate Anesthesia Transfer of Care Note  Patient: [name] [name]  Procedure(s) Performed: ESOPHAGOGASTRODUODENOSCOPY (EGD) FOREIGN BODY REMOVAL  Patient Location: PACU  Anesthesia Type:General  Level of Consciousness: awake, alert , oriented and patient cooperative  Airway & Oxygen Therapy: Patient Spontanous Breathing  Post-op Assessment: Report given to RN and Post -op Vital signs reviewed and stable  Post vital signs: Reviewed and stable  Last Vitals:  Vitals Value Taken Time  BP 182/90   Temp    Pulse 81 03/10/21 2236  Resp 18 03/10/21 2236  SpO2 98 % 03/10/21 2236  Vitals shown include unvalidated device data.  Last Pain:  Vitals:   03/10/21 2112  TempSrc: Temporal  PainSc: 0-No pain         Complications: No notable events documented.

## 2021-03-10 NOTE — Consult Note (Signed)
Consultation  Referring Provider:     Dr. Campbell Stall Primary Care Physician:  Tonia Ghent, MD Primary Gastroenterologist:        Dr. [name] Reason for Consultation:     Esophageal food impaction         HPI:   [name] [name] is a 71 y.o. male with a history of aortic valve replacement, diabetes and history of symptomatic Schatzki ring with numerous prior esophageal food impactions who presented to the emergency department earlier this evening with symptoms of esophageal food impaction.  The patient was eating country ham this morning when he experienced sensation of impaction.  He tried drinking Coke several times to dislodge the impacted bolus.  He has tried retching.  He has gotten small amounts of the food back up, but the bolus persists.  In the emergency department he was given glucagon and then nitroglycerin, neither of which resolved his symptoms.  He continues to be intolerant of his saliva having to spit into an emesis bag every few minutes.  His last food impaction was in 2017.  He underwent a dilation in 2018.  He denied any problems with dysphagia until the last few months, when he started having some "close calls" in which food was transiently stuck, but eventually went down.  He denies any problems with heartburn or acid regurgitation.  He used to take Nexium, but this was discontinued many years ago. He currently denies any symptoms of chest pain, shortness of breath, lightheadedness/dizziness.  No abdominal pain.  No nausea or vomiting.  No bloody stools or melena recently. He takes Coumadin for his artificial aortic valve, last dose this morning.  His INR in the emergency department is 2.4.  CBC and BMP were unremarkable  Past Medical History:  Diagnosis Date   Anemia    Aortic insufficiency    with bicuspid aortic valve (the last MRI demonstrated the aortic root to be 4.8 x 4.7 cm.) Mod regurgitation. Normal chamber size with very mild left ventricular dysfunction.)    Blood transfusion    CHF (congestive heart failure) ([place])    Cough 06/28/2009   Dr. 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   Pericardial effusion 07/2009   admission to [facility], s/p pericardial window   Pleural effusion 10/2009   s/p thoracentesis   PNA (pneumonia) 10/2009   admnission to [facility], with right pleural effusion, s/p thoracentesis   Syncope 08/2009   admission for fever and syncope, no source for fever seen on cultures. Syncope thought to be r/t orthostasis    Past Surgical History:  Procedure Laterality Date   CARDIAC VALVE REPLACEMENT  07/10/2009   AVR   Cardiolite EKG  03/10/2003   (-) ? small/apical infarct EF 50%   DOPPLER ECHOCARDIOGRAPHY  01/18/97   LVH, mild dilated aorta - root mod to severe aortic regurg EF 50-55%   DOPPLER ECHOCARDIOGRAPHY  09/27/03   No changes, moderate severe aortic regurg   DOPPLER ECHOCARDIOGRAPHY  07/09/2004   Mod severe aortic regurg 2-3+ T.R., T.I.R., mild P..R.   DOPPLER ECHOCARDIOGRAPHY  04/01/06  Hypokin Apex EF 55%   DOPPLER ECHOCARDIOGRAPHY  02/15/2010   Mild LVH, mild decr Sys fctn EF 40-45% Mild Dias Dysfctn Mech AV Triv AR, MR   ESOPHAGOGASTRODUODENOSCOPY  1990, 1999   Hot dog in throat (1987)/ Chicken in throat (1990)/ 01/06/97 (Dr. Earlean Shawl) Impaction, dilatation, Schatzki's Ring/ 07/1998 repeat Dilatation/ 01/10/01 stricture/dilated/ 03/08/05 Schatzki's Ring, dilated/ 06/01/08 Food Disimpaction [name] Bulb Ulcer (Dr. Watt Climes)   ESOPHAGOGASTRODUODENOSCOPY  01/06/1997   (Dr. Earlean Shawl) Impaction, dilatation, Schatzki's Ring   ESOPHAGOGASTRODUODENOSCOPY  08/09/98   Repeat dilatation  Deatra Ina)   ESOPHAGOGASTRODUODENOSCOPY  01/13/2001   stricture/ HH, duodenal ulcer / bleeding Henrene Pastor)   ESOPHAGOGASTRODUODENOSCOPY  12/25/01   esophagus stricture, dilated   ESOPHAGOGASTRODUODENOSCOPY  03/23/2001   stricture, dilated   ESOPHAGOGASTRODUODENOSCOPY  03/08/05   Schatzki's Ring   03/13/05  Schatzki's Ring - dilated, duodenitis   ESOPHAGOGASTRODUODENOSCOPY  06/11/08   with food disimpaction.  Food impaction Ms [place]  Sm bulb ulcer (Dr. Watt Climes)   ESOPHAGOGASTRODUODENOSCOPY     stricture/HH, duodenal ulcer/bleeding Henrene Pastor)   ESOPHAGOGASTRODUODENOSCOPY N/A 01/23/2016   Procedure: ESOPHAGOGASTRODUODENOSCOPY (EGD);  Surgeon: [name], MD;  Location: [facility] ENDOSCOPY;  Service: Endoscopy;  Laterality: N/A;   ESOPHAGOGASTRODUODENOSCOPY (EGD) WITH PROPOFOL N/A 02/06/2016   Procedure: ESOPHAGOGASTRODUODENOSCOPY (EGD) WITH PROPOFOL;  Surgeon: [name], MD;  Location: [facility] ENDOSCOPY;  Service: Endoscopy;  Laterality: N/A;   HERNIA REPAIR  2011   abdominal; post AVR   MRI  2/05   LVH global hypokin EF 48% Mod A.I.   VALVE REPLACEMENT  06/2009   St. Jude mechanical valve per Dr. Cyndia Bent    Family History  Problem Relation Age of Onset   Obesity Mother        bedridden   Hypertension Mother    Heart disease Father        CABG, CAD   Hypertension Father    Lymphoma Father    Cancer Father        lymphoma   Alcohol abuse Brother    Cancer Brother        melanoma   Colon cancer Paternal Aunt    Stomach cancer Neg Hx    Rectal cancer Neg Hx    Prostate cancer Neg Hx     Social History   Tobacco Use   Smoking status: Never   Smokeless tobacco: Never  Vaping Use   Vaping Use: Never used  Substance Use Topics   Alcohol use: No    Alcohol/week: 0.0 standard drinks   Drug use: No    Prior to Admission medications   Medication Sig Start Date End Date Taking? Authorizing Provider  ACCU-CHEK AVIVA PLUS test strip USE AS DIRECTED TO CHECK BLOOD SUGAR 2 TO 4 TIMES A DAY 09/29/20    Tonia Ghent, MD  Accu-Chek FastClix Lancets MISC USE AS DIRECTED TO CHECK BLOOD SUGAR TWICE DAILY 09/29/20   Tonia Ghent, MD  amoxicillin (AMOXIL) 500 MG tablet Take 2,000 mg by mouth once. Before dental visits.    [provider]  azelastine (ASTELIN) 0.1 % nasal spray Place 2 sprays into both nostrils 2 (two) times daily as needed for rhinitis. Use in each nostril as directed 09/29/20   Tonia Ghent, MD  carvedilol (COREG) 6.25 MG tablet TAKE 1 TABLET BY MOUTH TWICE A DAY. KEEPOFFICE VISIT 02/27/21   Minus Breeding, MD  cetirizine (ZYRTEC) 10 MG tablet Take 10 mg by mouth daily.    [provider]  fluocinonide cream (LIDEX) 0.05 % APPLY  1 APPLICATION TO AFFECTEDE AREAS 2 TIMES DAILY AS NEEDED 06/27/20   Tonia Ghent, MD  furosemide (LASIX) 20 MG tablet TAKE 1 TABLET BY MOUTH ONCE DAILY 10/31/20   Tonia Ghent, MD  glipiZIDE (GLUCOTROL) 5 MG tablet TAKE 2 TABLES BY MOUTH DAILY BEFORE BREAKFAST IF AM SUGAR IS ABOVE 150 01/15/21   Tonia Ghent, MD  Lancets Misc. (ACCU-CHEK FASTCLIX LANCET) KIT AS DIRECTED 05/23/17   Tonia Ghent, MD  losartan (COZAAR) 100 MG tablet TAKE 1 TABLET BY MOUTH ONCE A DAY (PLEASE SCHEDULE APPT) 12/18/20   Minus Breeding, MD  metFORMIN (GLUCOPHAGE) 500 MG tablet TAKE 2 TABLETS BY MOUTH TWICE A DAY 01/15/21   Tonia Ghent, MD  potassium chloride (KLOR-CON) 10 MEQ tablet TAKE 1 TABLET BY MOUTH TWICE A DAY 04/17/20   Tonia Ghent, MD  pravastatin (PRAVACHOL) 20 MG tablet TAKE 1 TABLET BY MOUTH ONCE A DAY 12/19/20   Tonia Ghent, MD  psyllium (METAMUCIL) 58.6 % packet Take 1 packet by mouth daily.    [provider]  warfarin (COUMADIN) 1 MG tablet TAKE ON TABLET BY MOUTH DAILY EXCEPT TAKE 2 TABLETS ON MONDAYS AND FRIDAYS OR AS DIRECTED BY ANTICOAGULATION CLINIC (ALSO TAKE 3MG DOSE TABLETS AS DIRECTED BY ANTICOAGULATION CLINIC) 08/09/20   Tonia Ghent, MD  warfarin (COUMADIN) 3 MG tablet TAKE 1 TABLET BY MOUTH ON  SUNDAYS, TUESDAYS, WEDNESDAYS, THURSDAYS, AND SATURDAYS OR AS DIRECTED BY ANTICOAGULATION [place] 02/05/21   Tonia Ghent, MD    No current facility-administered medications for this encounter.   Current Outpatient Medications  Medication Sig Dispense Refill   ACCU-CHEK AVIVA PLUS test strip USE AS DIRECTED TO CHECK BLOOD SUGAR 2 TO 4 TIMES A DAY 100 each 11   Accu-Chek FastClix Lancets MISC USE AS DIRECTED TO CHECK BLOOD SUGAR TWICE DAILY 102 each 4   amoxicillin (AMOXIL) 500 MG tablet Take 2,000 mg by mouth once. Before dental visits.     azelastine (ASTELIN) 0.1 % nasal spray Place 2 sprays into both nostrils 2 (two) times daily as needed for rhinitis. Use in each nostril as directed 30 mL 12   carvedilol (COREG) 6.25 MG tablet TAKE 1 TABLET BY MOUTH TWICE A DAY. KEEPOFFICE VISIT 180 tablet 3   cetirizine (ZYRTEC) 10 MG tablet Take 10 mg by mouth daily.     fluocinonide cream (LIDEX) 1.03 % APPLY 1 APPLICATION TO AFFECTEDE AREAS 2 TIMES DAILY AS NEEDED 30 g 1   furosemide (LASIX) 20 MG tablet TAKE 1 TABLET BY MOUTH ONCE DAILY 90 tablet 2   glipiZIDE (GLUCOTROL) 5 MG tablet TAKE 2 TABLES BY MOUTH DAILY BEFORE BREAKFAST IF AM SUGAR IS ABOVE 150 180 tablet 1   Lancets Misc. (ACCU-CHEK FASTCLIX LANCET) KIT AS DIRECTED 1 kit 0   losartan (COZAAR) 100 MG tablet TAKE 1 TABLET BY MOUTH ONCE A DAY (PLEASE SCHEDULE APPT) 90 tablet 0   metFORMIN (GLUCOPHAGE) 500 MG tablet TAKE 2 TABLETS BY MOUTH TWICE A DAY 360 tablet 3   potassium chloride (KLOR-CON) 10 MEQ tablet TAKE 1 TABLET BY MOUTH TWICE A DAY 180 tablet 3   pravastatin (PRAVACHOL) 20 MG tablet TAKE 1 TABLET BY MOUTH ONCE A DAY 90 tablet 0   psyllium (METAMUCIL) 58.6 % packet Take 1 packet by mouth daily.     warfarin (COUMADIN) 1 MG tablet TAKE ON TABLET BY MOUTH DAILY EXCEPT TAKE 2 TABLETS ON MONDAYS AND FRIDAYS OR AS DIRECTED BY ANTICOAGULATION CLINIC (ALSO TAKE  3MG DOSE TABLETS AS DIRECTED BY ANTICOAGULATION CLINIC) 120 tablet 2   warfarin  (COUMADIN) 3 MG tablet TAKE 1 TABLET BY MOUTH ON SUNDAYS, TUESDAYS, WEDNESDAYS, THURSDAYS, AND SATURDAYS OR AS DIRECTED BY ANTICOAGULATION CINIC 70 tablet 0    Allergies as of 03/10/2021 - Review Complete 03/10/2021  Allergen Reaction Noted   Ace inhibitors     Aspirin Other (See Comments) 04/17/2017   Other  03/27/2020   Primaxin [imipenem w/cilastatin sodium]  06/07/2010   Tape  09/29/2020     Review of Systems:    As per HPI, otherwise negative    Physical Exam:  Vital signs in last 24 hours: Temp:  [98.3 F (36.8 C)] 98.3 F (36.8 C) (02/11 1730) Pulse Rate:  [54-58] 54 (02/11 1930) Resp:  [18] 18 (02/11 1730) BP: (138-185)/(67-88) 138/67 (02/11 1930) SpO2:  [95 %-97 %] 97 % (02/11 1930)   General:   Pleasant Caucasian male lying in ED bed in NAD accompanied by spouse Head:  Normocephalic and atraumatic. Eyes:   No icterus.   Conjunctiva pink. Ears:  Normal auditory acuity. Neck:  Supple Lungs:  Respirations even and unlabored. Lungs clear to auscultation bilaterally.   No wheezes, crackles, or rhonchi.  Heart:  Regular rate and rhythm; no MRG, loud audible click from aortic valve Abdomen:  Soft, nondistended, nontender. Normal bowel sounds. No appreciable masses or hepatomegaly.  Rectal:  Not performed.  Msk:  Symmetrical without gross deformities.  Extremities:  Without edema. Neurologic:  Alert and  oriented x4;  grossly normal neurologically. Skin:  Intact without significant lesions or rashes. Psych:  Alert and cooperative. Normal affect.  LAB RESULTS: Recent Labs    03/10/21 1740  WBC 6.8  HGB 15.6  HCT 44.7  PLT 160   BMET Recent Labs    03/10/21 1740  NA 139  K 4.1  CL 105  CO2 23  GLUCOSE 108*  BUN 19  CREATININE 0.90  CALCIUM 9.6   LFT No results for input(s): PROT, ALBUMIN, AST, ALT, ALKPHOS, BILITOT, BILIDIR, IBILI in the last 72 hours. PT/INR Recent Labs    03/10/21 1826  LABPROT 26.1*  INR 2.4*    STUDIES: No results  found.   PREVIOUS ENDOSCOPIES:            Numerous EGDs for disimpaction, most recently 2017   Impression / Plan:   71 year old male with history of symptomatic Schatzki ring and frequent esophageal impactions presenting with another esophageal impaction when eating ham this morning at 830.  He is status post aortic valve replacement and takes Coumadin.  His INR was in therapeutic range at 2.4.  An urgent upper endoscopy is needed to dislodge the impacted food bolus.  Esophageal disimpaction is not necessarily considered a high risk procedure from a bleeding standpoint, and can often be done very quickly with gentle downward pressure.  I think the risks of reversing his Coumadin are much greater than performing the procedure with an INR of 2.4.  I discussed these matters with the patient and he agreed.   We will plan for an EGD with esophageal disimpaction.  We will not plan on dilation today given the elevated INR.  The procedure will be done as soon as we have anesthesia support (estimated to be no sooner than 9:30 PM).  He should be able to be discharged home following the procedure. I recommended that he follow-up with his primary gastroenterologist to schedule an elective outpatient EGD with dilation off Coumadin.  Esophageal food  impaction - Urgent EGD with disimpaction  The details, risks (including bleeding, perforation, infection, missed lesions, medication reactions and possible hospitalization or surgery if complications occur), benefits, and alternatives to EGD with disimpaction were discussed with the patient and he consents to proceed.     LOS: 0 days   Daryel November  03/10/2021, 8:06 PM

## 2021-03-10 NOTE — ED Notes (Signed)
Pt had diet coke and vomited. EDP at bedside.

## 2021-03-10 NOTE — ED Triage Notes (Signed)
Pt reports eating this morning and getting a piece of ham stuck in his throat. Pt reports trying to drink soda to clear the ham, but has been unable to. Reports hx of similar incidents.

## 2021-03-10 NOTE — ED Provider Notes (Signed)
[place] DEPT Provider Note   CSN: [id number] Arrival date & time: 03/10/21  1720     History  Chief Complaint  Patient presents with   Food Bolus    [name] [name] is a 71 y.o. male.  Patient is a 71 y.o. male with pmh of schatzi ring presenting for food bolus sensation that occurred this morning. States he has had this happen in the past with multiple EGDs due to impaction. Attempted to drink coke without improvement of symptoms. States he had to pull over multiple times while driving in the ER to spit. Difficulty tolerating secretions at this time.   The history is provided by the patient. No language interpreter was used.      Home Medications Prior to Admission medications   Medication Sig Start Date End Date Taking? Authorizing Provider  ACCU-CHEK AVIVA PLUS test strip USE AS DIRECTED TO CHECK BLOOD SUGAR 2 TO 4 TIMES A DAY 09/29/20   Tonia Ghent, MD  Accu-Chek FastClix Lancets MISC USE AS DIRECTED TO CHECK BLOOD SUGAR TWICE DAILY 09/29/20   Tonia Ghent, MD  amoxicillin (AMOXIL) 500 MG tablet Take 2,000 mg by mouth once. Before dental visits.    [provider]  azelastine (ASTELIN) 0.1 % nasal spray Place 2 sprays into both nostrils 2 (two) times daily as needed for rhinitis. Use in each nostril as directed 09/29/20   Tonia Ghent, MD  carvedilol (COREG) 6.25 MG tablet TAKE 1 TABLET BY MOUTH TWICE A DAY. KEEPOFFICE VISIT 02/27/21   Minus Breeding, MD  cetirizine (ZYRTEC) 10 MG tablet Take 10 mg by mouth daily.    [provider]  fluocinonide cream (LIDEX) 9.50 % APPLY 1 APPLICATION TO AFFECTEDE AREAS 2 TIMES DAILY AS NEEDED 06/27/20   Tonia Ghent, MD  furosemide (LASIX) 20 MG tablet TAKE 1 TABLET BY MOUTH ONCE DAILY 10/31/20   Tonia Ghent, MD  glipiZIDE (GLUCOTROL) 5 MG tablet TAKE 2 TABLES BY MOUTH DAILY BEFORE BREAKFAST IF AM SUGAR IS ABOVE 150 01/15/21   Tonia Ghent, MD  Lancets Misc. (ACCU-CHEK  FASTCLIX LANCET) KIT AS DIRECTED 05/23/17   Tonia Ghent, MD  losartan (COZAAR) 100 MG tablet TAKE 1 TABLET BY MOUTH ONCE A DAY (PLEASE SCHEDULE APPT) 12/18/20   Minus Breeding, MD  metFORMIN (GLUCOPHAGE) 500 MG tablet TAKE 2 TABLETS BY MOUTH TWICE A DAY 01/15/21   Tonia Ghent, MD  potassium chloride (KLOR-CON) 10 MEQ tablet TAKE 1 TABLET BY MOUTH TWICE A DAY 04/17/20   Tonia Ghent, MD  pravastatin (PRAVACHOL) 20 MG tablet TAKE 1 TABLET BY MOUTH ONCE A DAY 12/19/20   Tonia Ghent, MD  psyllium (METAMUCIL) 58.6 % packet Take 1 packet by mouth daily.    [provider]  warfarin (COUMADIN) 1 MG tablet TAKE ON TABLET BY MOUTH DAILY EXCEPT TAKE 2 TABLETS ON MONDAYS AND FRIDAYS OR AS DIRECTED BY ANTICOAGULATION CLINIC (ALSO TAKE $RemoveBe'3MG'IxOaSrQDE$  DOSE TABLETS AS DIRECTED BY ANTICOAGULATION CLINIC) 08/09/20   Tonia Ghent, MD  warfarin (COUMADIN) 3 MG tablet TAKE 1 TABLET BY MOUTH ON SUNDAYS, TUESDAYS, WEDNESDAYS, THURSDAYS, AND SATURDAYS OR AS DIRECTED BY ANTICOAGULATION [place] 02/05/21   Tonia Ghent, MD      Allergies    Ace inhibitors, Aspirin, Other, Primaxin [imipenem w/cilastatin sodium], and Tape    Review of Systems   Review of Systems  Constitutional:  Negative for chills and fever.  HENT:  Negative for ear pain  and sore throat.        Difficulty tolerating secretions   Eyes:  Negative for pain and visual disturbance.  Respiratory:  Negative for cough and shortness of breath.   Cardiovascular:  Negative for chest pain and palpitations.  Gastrointestinal:  Negative for abdominal pain and vomiting.  Genitourinary:  Negative for dysuria and hematuria.  Musculoskeletal:  Negative for arthralgias and back pain.  Skin:  Negative for color change and rash.  Neurological:  Negative for seizures and syncope.  All other systems reviewed and are negative.  Physical Exam Updated Vital Signs BP 138/67    Pulse (!) 54    Temp 98.3 F (36.8 C) (Oral)    Resp 18    SpO2 97%   Physical Exam Vitals and nursing note reviewed.  Constitutional:      General: He is not in acute distress.    Appearance: He is well-developed.  HENT:     Head: Normocephalic and atraumatic.     Comments: Spitting in bag    Mouth/Throat:     Lips: Pink.     Mouth: Mucous membranes are moist.  Eyes:     Conjunctiva/sclera: Conjunctivae normal.  Cardiovascular:     Rate and Rhythm: Normal rate and regular rhythm.     Heart sounds: No murmur heard. Pulmonary:     Effort: Pulmonary effort is normal. No respiratory distress.     Breath sounds: Normal breath sounds.  Abdominal:     Palpations: Abdomen is soft.     Tenderness: There is no abdominal tenderness.  Musculoskeletal:        General: No swelling.     Cervical back: Neck supple.  Skin:    General: Skin is warm and dry.     Capillary Refill: Capillary refill takes less than 2 seconds.  Neurological:     Mental Status: He is alert.  Psychiatric:        Mood and Affect: Mood normal.    ED Results / Procedures / Treatments   Labs (all labs ordered are listed, but only abnormal results are displayed) Labs Reviewed  BASIC METABOLIC PANEL - Abnormal; Notable for the following components:      Result Value   Glucose, Bld 108 (*)    All other components within normal limits  PROTIME-INR - Abnormal; Notable for the following components:   Prothrombin Time 26.1 (*)    INR 2.4 (*)    All other components within normal limits  RESP PANEL BY RT-PCR (FLU A&B, COVID) ARPGX2  CBC WITH DIFFERENTIAL/PLATELET    EKG None  Radiology No results found.  Procedures .Critical Care Performed by: Lianne Cure, DO Authorized by: Lianne Cure, DO   Critical care provider statement:    Critical care time (minutes):  31   Critical care was necessary to treat or prevent imminent or life-threatening deterioration of the following conditions: Food impaction requiring urgent EGD.   Critical care was time spent personally by me  on the following activities:  Development of treatment plan with patient or surrogate, discussions with consultants, evaluation of patient's response to treatment, examination of patient, ordering and review of laboratory studies, ordering and review of radiographic studies, ordering and performing treatments and interventions, pulse oximetry, re-evaluation of patient's condition and review of old charts   Care discussed with comment:  ENT    Medications Ordered in ED Medications  nitroGLYCERIN (NITROSTAT) SL tablet 0.4 mg (0.4 mg Sublingual Given 03/10/21 1814)    ED  Course/ Medical Decision Making/ A&P                           Medical Decision Making Amount and/or Complexity of Data Reviewed Labs: ordered.   8:24 PM 71 y.o. male with pmh of schatzi ring presenting for food bolus sensation that occurred this morning.   No improvement with coke No improvement with glucagon Pt states he was given nitroglycerin in the past with improvement of symptoms. I researched case studies that demonstrated 2 patients that had relief shortly after receiving nitroglycerin 0.4mg  diluted in 10 ml liquid and consumed orally. BP stable. Medication ordered.   Call back from ENT. Dr. Candis Schatz agrees to see patient this evening.   8:24 PM Re-evaluation of patient demonstrates improving symptoms after nitroglycerin. Patient tolerating secretions at this time. Will attempt PO challenge. If successful will call and cancel ENT.   8:24 PM Unable to tolerate PO. Vomiting again. ENT came to beside. Plan to take to OR in 2 hours.   Pt on warfarin. INR sent.  Covid sent CBC sent        Final Clinical Impression(s) / ED Diagnoses Final diagnoses:  Schatzki's ring  Globus sensation  Food impaction of esophagus, initial encounter    Rx / DC Orders ED Discharge Orders     None         Lianne Cure, DO 83/15/17 2025

## 2021-03-10 NOTE — Progress Notes (Signed)
IS given and teached, up to 1250 . O2 off. 97% with room air

## 2021-03-10 NOTE — Op Note (Signed)
[facility] Patient Name: [name] [name] Procedure Date: 03/10/2021 MRN: [id number] Attending MD: Gladstone Pih. Candis Schatz , MD Date of Birth: Jan 30, 1950 CSN: [id number] Age: 71 Admit Type: Emergency Department Procedure:                Upper GI endoscopy Indications:              Foreign body in the esophagus Providers:                Nicki Reaper E. Candis Schatz, MD, Dulcy Fanny, [facility] Technician, Technician Referring MD:              Medicines:                General Anesthesia Complications:            No immediate complications. Estimated Blood Loss:     Estimated blood loss: none. Procedure:                Pre-Anesthesia Assessment:                           - Prior to the procedure, a History and Physical                            was performed, and patient medications and                            allergies were reviewed. The patient's tolerance of                            previous anesthesia was also reviewed. The risks                            and benefits of the procedure and the sedation                            options and risks were discussed with the patient.                            All questions were answered, and informed consent                            was obtained. Prior Anticoagulants: The patient has                            taken Coumadin (warfarin), last dose was day of                            procedure. ASA Grade Assessment: III - A patient                            with severe systemic disease. After reviewing the  risks and benefits, the patient was deemed in                            satisfactory condition to undergo the procedure.                           After obtaining informed consent, the endoscope was                            passed under direct vision. Throughout the                            procedure, the patient's blood pressure, pulse, and                             oxygen saturations were monitored continuously. The                            [id number]-H190 ([id number]) Olympus endoscope was introduced                            through the mouth, and advanced to the third part                            of duodenum. The upper GI endoscopy was                            accomplished without difficulty. The patient                            tolerated the procedure well. Scope In: Scope Out: Findings:      The examined portions of the nasopharynx, oropharynx and larynx were       normal.      Food was found in the middle third of the esophagus. Initially the food       bolus was sitting in the mid esophagus, not impacted. When the bolus was       pushed distally, it became lodged in the distal esophageal stenosis and       could not be pushed into the stomach. Then, a Jabier Mutton net was used to grasp       the food bolus and remove it through the mouth.      One benign-appearing, intrinsic moderate stenosis was found 41 to 44 cm       from the incisors. This stenosis measured 1 cm (inner diameter) x 3 cm       (in length). The stenosis was traversed with no resistance. This was not       dilated due to the patient's recent coumadin use.      LA Grade A (one or more mucosal breaks less than 5 mm, not extending       between tops of 2 mucosal folds) esophagitis was found.      The exam of the esophagus was otherwise normal.      Striped mildly erythematous mucosa without bleeding was found in the       gastric antrum.  The exam of the stomach was otherwise normal.      A few localized erosions without bleeding were found in the second       portion of the duodenum.      A single nodule was found in the second portion of the duodenum. This       was biopsied in 2018 and appeared stable in size. Impression:               - The examined portions of the nasopharynx,                            oropharynx and larynx were normal.                            - Food in the middle third of the esophagus.                           - Benign-appearing esophageal stenosis.                           - LA Grade A reflux esophagitis.                           - Erythematous mucosa in the antrum.                           - Duodenal erosions without bleeding.                           - Nodule found in the duodenum.                           - No specimens collected. Moderate Sedation:      Not Applicable - Patient had care per Anesthesia. Recommendation:           - Patient has a contact number available for                            emergencies. The signs and symptoms of potential                            delayed complications were discussed with the                            patient. Return to normal activities tomorrow.                            Written discharge instructions were provided to the                            patient.                           - Soft diet today. Avoid chewy meats/breads until  dilation                           - Continue present medications.                           - Follow up with primary gastroenterologist and                            recommend repeat upper endoscopy with dilation off                            coumadin to reduce risk of recurrent impactions..                           - Use Prilosec (omeprazole) 20 mg PO daily                            indefinitely.                           - Avoid NSAIDs given presence of duodenal erosions                            and gastric erythema. Procedure Code(s):        --- Professional ---                           [phone], Esophagogastroduodenoscopy, flexible,                            transoral; diagnostic, including collection of                            specimen(s) by brushing or washing, when performed                            (separate procedure) Diagnosis Code(s):        --- Professional ---                            O96.295M, Food in esophagus causing other injury,                            initial encounter                           K22.2, Esophageal obstruction                           K21.00, Gastro-esophageal reflux disease with                            esophagitis, without bleeding                           K31.89, Other diseases of stomach and duodenum  K26.9, Duodenal ulcer, unspecified as acute or                            chronic, without hemorrhage or perforation                           T18.108A, Unspecified foreign body in esophagus                            causing other injury, initial encounter CPT copyright 2019 American Medical Association. All rights reserved. The codes documented in this report are preliminary and upon coder review may  be revised to meet current compliance requirements. Brighton Delio E. Candis Schatz, MD 03/10/2021 10:26:01 PM This report has been signed electronically. Number of Addenda: 0

## 2021-03-12 ENCOUNTER — Other Ambulatory Visit: Payer: Self-pay | Admitting: Cardiology

## 2021-03-12 ENCOUNTER — Encounter (HOSPITAL_COMMUNITY): Payer: Self-pay | Admitting: Gastroenterology

## 2021-03-12 DIAGNOSIS — E11620 Type 2 diabetes mellitus with diabetic dermatitis: Secondary | ICD-10-CM | POA: Diagnosis not present

## 2021-03-12 DIAGNOSIS — I429 Cardiomyopathy, unspecified: Secondary | ICD-10-CM | POA: Diagnosis not present

## 2021-03-12 DIAGNOSIS — E785 Hyperlipidemia, unspecified: Secondary | ICD-10-CM | POA: Diagnosis not present

## 2021-03-12 DIAGNOSIS — E669 Obesity, unspecified: Secondary | ICD-10-CM | POA: Diagnosis not present

## 2021-03-12 DIAGNOSIS — K5900 Constipation, unspecified: Secondary | ICD-10-CM | POA: Diagnosis not present

## 2021-03-12 DIAGNOSIS — I10 Essential (primary) hypertension: Secondary | ICD-10-CM | POA: Diagnosis not present

## 2021-03-12 DIAGNOSIS — I2510 Atherosclerotic heart disease of native coronary artery without angina pectoris: Secondary | ICD-10-CM | POA: Diagnosis not present

## 2021-03-12 DIAGNOSIS — J309 Allergic rhinitis, unspecified: Secondary | ICD-10-CM | POA: Diagnosis not present

## 2021-03-12 DIAGNOSIS — K219 Gastro-esophageal reflux disease without esophagitis: Secondary | ICD-10-CM | POA: Diagnosis not present

## 2021-03-12 NOTE — Anesthesia Postprocedure Evaluation (Signed)
Anesthesia Post Note  Patient: [name] [name]  Procedure(s) Performed: ESOPHAGOGASTRODUODENOSCOPY (EGD) FOREIGN BODY REMOVAL     Patient location during evaluation: PACU Anesthesia Type: General Level of consciousness: awake and alert Pain management: pain level controlled Vital Signs Assessment: post-procedure vital signs reviewed and stable Respiratory status: spontaneous breathing, nonlabored ventilation and respiratory function stable Cardiovascular status: blood pressure returned to baseline Postop Assessment: no apparent nausea or vomiting Anesthetic complications: no   No notable events documented.  Last Vitals:  Vitals:   03/10/21 2300 03/10/21 2315  BP: (!) 153/88 (!) 157/87  Pulse: 75 76  Resp: 19 19  Temp:  36.6 C  SpO2: 95% 99%    Last Pain:  Vitals:   03/10/21 2315  TempSrc:   PainSc: 0-No pain                 Marthenia Rolling

## 2021-03-15 ENCOUNTER — Telehealth: Payer: Self-pay | Admitting: Gastroenterology

## 2021-03-15 NOTE — Telephone Encounter (Signed)
Hi Dr. Candis Schatz,  Patients wife called today to schedule another EGD after reviewing your op report notes it states for patient to follow up in the office and then schedule. I have the patient scheduled for 03/28/21 with Vicie Mutters PA-C but the patients wife states he is still having trouble with eating anything. Please advise.   Thanks

## 2021-03-16 ENCOUNTER — Ambulatory Visit: Payer: Medicare PPO | Admitting: Family Medicine

## 2021-03-16 NOTE — Telephone Encounter (Signed)
Pts wife states that he has not seen GI in [place] since 2017 and that provider has since retired. He will keep scheduled appt here and knows to stay on soft diet and avoid bread/meats.

## 2021-03-23 ENCOUNTER — Other Ambulatory Visit: Payer: Self-pay

## 2021-03-23 ENCOUNTER — Ambulatory Visit: Payer: Medicare PPO

## 2021-03-23 DIAGNOSIS — Z952 Presence of prosthetic heart valve: Secondary | ICD-10-CM

## 2021-03-23 DIAGNOSIS — Z7901 Long term (current) use of anticoagulants: Secondary | ICD-10-CM

## 2021-03-23 DIAGNOSIS — I4891 Unspecified atrial fibrillation: Secondary | ICD-10-CM

## 2021-03-23 LAB — POCT INR: INR: 2.4 (ref 2.0–3.0)

## 2021-03-23 NOTE — Progress Notes (Signed)
Continue to take 4 mg daily except 2 mg on Monday and Fridays.  Recheck in 6 weeks.

## 2021-03-23 NOTE — Patient Instructions (Addendum)
Pre visit review using our clinic review tool, if applicable. No additional management support is needed unless otherwise documented below in the visit note.  Continue to take 4 mg daily except 2 mg on Monday and Fridays.  Recheck in 6 weeks.

## 2021-03-26 ENCOUNTER — Other Ambulatory Visit: Payer: Self-pay | Admitting: Family Medicine

## 2021-03-26 NOTE — Progress Notes (Signed)
03/28/2021 [name] [name] [id number] 01/01/1951   ASSESSMENT AND PLAN:   Esophageal stenosis Gastroesophageal reflux disease without esophagitis Has had multiple EGD's with dil, will repeat EGD off coumadin Can do trial of PPI twice a day in the mean time I discussed risks of EGD with patient today, including risk of sedation, bleeding or perforation.  Patient provides understanding and gave verbal consent to proceed.  Chronic anticoagulation Patient will need coumadin to lovenox bridge at the time time of procedure.  We will communicate with his prescribing physician Dr. Percival Spanish for this We discussed the risk, benefits and alternatives to endoscopy and he is agreeable and wishes to proceed. Alternatives to the procedure were discussed as well as risks and benefits of procedure including bleeding, perforation, infection,  medication reactions and possible hospitalization or surgery if complications occur explained. Additional rare but real risk of cardiovascular event such as heart attack or ischemia/infarct of other organs off Coumadin explained and need to seek urgent help if this occurs.  He is agreeable and wishes to proceed.   Atrial fibrillation, unspecified type (HCC) NSR at this time  Dilated cardiomyopathy ([place]) 02/16/2021 Echo EF 45-50%, no PHTN   Patient Care Team: Tonia Ghent, MD as PCP - General (Family Medicine) Minus Breeding, MD as PCP - Cardiology (Cardiology) Erroll Luna, MD as Consulting Physician (General Surgery) Thelma Comp, OD (Optometry)  HISTORY OF PRESENT ILLNESS: 71 y.o. male with a past medical history of  aortic valve replacement 2011 on Coumadin, follows with Dr. Percival Spanish, diabetes and history of symptomatic Schatzki ring with numerous prior esophageal food impactions last 02/17/2015 and others listed below, presents for hospital follow up.  Patient known to Dr. Hilarie Fredrickson.  Patient was in the hospital from 03/10/21 to 03/10/21, was  in the hospital for food impaction. 03/10/2021 endoscopy with Dr. Candis Schatz with food middle third of esophagus kind of benign-appearing stenosis, grade a reflux esophagitis, gastritis, duodenal erosions without bleeding, nodule in duodenum, no specimens collected. Dilatation was not done since patient was not off anticoagulant.  Needs repeat while off anticoagulants.  Omeprazole 20 mg daily indefinitely.  Avoid NSAIDs.  He is on omeprazole 20 mg once daily.  Denies any GERD.  States his swallowing has improved, he just has to be careful, he is cutting his potassium pills, avoiding steak and doing softer foods.  He has a lot of sinus drainage, clearing of this throat, he is on astelin only. Not on zyrtec.  Denies AB pain.  Denies melena, hematochezia.   External labs and notes reviewed this visit: 01/2016 EGD - Benign-appearing esophageal stenosis. Dilated. - LA Grade B esophagitis. Biopsied. - Gastritis. Biopsied. - Congested duodenal mucosa. Biopsied. - Mucosal changes in the duodenum. Biopsied.  Multiple endoscopies : 01/26/2016, 01/23/2016 12/08/2010, 11/29/2010, 11/12/2010, 03/23/2001, 02/24/2001, 12/14 2002, 08/09/1998, 1999 2x1998  07/11/2011 colonoscopy for heavy positive stool anemia Diverticulosis, hemorrhoids, recall 10 years.  Current Medications:   Current Outpatient Medications (Endocrine & Metabolic):    glipiZIDE (GLUCOTROL) 5 MG tablet, TAKE 2 TABLES BY MOUTH DAILY BEFORE BREAKFAST IF AM SUGAR IS ABOVE 150   metFORMIN (GLUCOPHAGE) 500 MG tablet, TAKE 2 TABLETS BY MOUTH TWICE A DAY  Current Outpatient Medications (Cardiovascular):    carvedilol (COREG) 6.25 MG tablet, TAKE 1 TABLET BY MOUTH TWICE A DAY. KEEPOFFICE VISIT   furosemide (LASIX) 20 MG tablet, TAKE 1 TABLET BY MOUTH ONCE DAILY   losartan (COZAAR) 100 MG tablet, TAKE 1 TABLET BY MOUTH ONCE A DAY (PLEASE  SCHEDULE APPT)   pravastatin (PRAVACHOL) 20 MG tablet, TAKE 1 TABLET BY MOUTH ONCE A DAY  Current  Outpatient Medications (Respiratory):    azelastine (ASTELIN) 0.1 % nasal spray, Place 2 sprays into both nostrils 2 (two) times daily as needed for rhinitis. Use in each nostril as directed   cetirizine (ZYRTEC) 10 MG tablet, Take 10 mg by mouth daily.   Current Outpatient Medications (Hematological):    warfarin (COUMADIN) 1 MG tablet, TAKE ON TABLET BY MOUTH DAILY EXCEPT TAKE 2 TABLETS ON MONDAYS AND FRIDAYS OR AS DIRECTED BY ANTICOAGULATION CLINIC (ALSO TAKE 3MG DOSE TABLETS AS DIRECTED BY ANTICOAGULATION CLINIC)   warfarin (COUMADIN) 3 MG tablet, TAKE 1 TABLET BY MOUTH ON SUNDAYS, TUESDAYS, WEDNESDAYS, THURSDAYS, AND SATURDAYS OR AS DIRECTED BY ANTICOAGULATION CINIC  Current Outpatient Medications (Other):    ACCU-CHEK AVIVA PLUS test strip, USE AS DIRECTED TO CHECK BLOOD SUGAR 2 TO 4 TIMES A DAY   Accu-Chek FastClix Lancets MISC, USE AS DIRECTED TO CHECK BLOOD SUGAR TWICE DAILY   amoxicillin (AMOXIL) 500 MG tablet, Take 2,000 mg by mouth once. Before dental visits.   fluocinonide cream (LIDEX) 5.09 %, APPLY 1 APPLICATION TO AFFECTEDE AREAS 2 TIMES DAILY AS NEEDED   Lancets Misc. (ACCU-CHEK FASTCLIX LANCET) KIT, AS DIRECTED   omeprazole (PRILOSEC OTC) 20 MG tablet, Take 1 tablet (20 mg total) by mouth daily.   potassium chloride (KLOR-CON) 10 MEQ tablet, TAKE 1 TABLET BY MOUTH TWICE A DAY   psyllium (METAMUCIL) 58.6 % packet, Take 1 packet by mouth daily.  Medical History:  Past Medical History:  Diagnosis Date   Anemia    Aortic insufficiency    with bicuspid aortic valve (the last MRI demonstrated the aortic root to be 4.8 x 4.7 cm.) Mod regurgitation. Normal chamber size with very mild left ventricular dysfunction.)   Blood transfusion    CHF (congestive heart failure) ([place])    Cough 06/28/2009   Dr. 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   Pericardial effusion 07/2009   admission to [facility], s/p pericardial window   Pleural effusion 10/2009   s/p thoracentesis   PNA (pneumonia) 10/2009   [facility]mnission to [facility], with right pleural effusion, s/p thoracentesis   Sleep apnea    Syncope 08/2009   admission for fever and syncope, no source for fever seen on cultures. Syncope thought to be r/t orthostasis   Allergies:  Allergies  Allergen Reactions   Ace Inhibitors     REACTION: cough   Aspirin Other (See Comments)    Gastritis.    Other     dexcom patch- local rash- presumed adhesive allergy.     Primaxin [Imipenem W/Cilastatin Sodium]     Rash- but able to take amoxil w/o troubles.    Tape     Rash from bandaids.       Surgical History:  He  has a past surgical history that includes Esophagogastroduodenoscopy (1990, 1999); Esophagogastroduodenoscopy (01/06/1997); Valve replacement (06/2009); doppler echocardiography (01/18/97); Esophagogastroduodenoscopy (08/09/98); Esophagogastroduodenoscopy (01/13/2001); Esophagogastroduodenoscopy (12/25/01); Esophagogastroduodenoscopy (03/23/2001); Cardiolite EKG (03/10/2003); MRI (2/05); doppler echocardiography (09/27/03); doppler echocardiography (07/09/2004);  Esophagogastroduodenoscopy (03/08/05); doppler echocardiography (04/01/06); Esophagogastroduodenoscopy (06/11/08); doppler echocardiography (02/15/2010); Esophagogastroduodenoscopy; Cardiac valve replacement (07/10/2009); Hernia repair (2011); Esophagogastroduodenoscopy (N/A, 01/23/2016); Esophagogastroduodenoscopy (egd) with propofol (N/A, 02/06/2016); Esophagogastroduodenoscopy (N/A, 03/10/2021); and Foreign Body Removal (03/10/2021). Family History:  His family history includes Alcohol abuse in his brother; Cancer  in his brother and father; Colon cancer in his paternal aunt; Heart disease in his father; Hypertension in his father and mother; Lymphoma in his father; Obesity in his mother. Social History:   reports that he has never smoked. He has never used smokeless tobacco. He reports that he does not drink alcohol and does not use drugs.  REVIEW OF SYSTEMS  : All other systems reviewed and negative except where noted in the History of Present Illness.   PHYSICAL EXAM: BP 130/60    Pulse (!) 56    Ht 5' 10.5" (1.791 m)    Wt 235 lb (106.6 kg)    BMI 33.24 kg/m  General:   Pleasant, well developed male in no acute distress Head:  Normocephalic and atraumatic. Eyes: sclerae anicteric,conjunctive pink  Heart:  regular rate and rhythm, mechanical click Pulm: Clear anteriorly; no wheezing Abdomen:  Soft, Obese AB, skin exam , Normal bowel sounds.  no  tenderness . Without guarding and Without rebound, without hepatomegaly. Extremities:  Without edema, does have stasis dermatitis Msk:  Symmetrical without gross deformities. Peripheral pulses intact.  Neurologic:  Alert and  oriented x4;  grossly normal neurologically. Skin:   Dry and intact without significant lesions or rashes. Psychiatric: Demonstrates good judgement and reason without abnormal affect or behaviors.   Vladimir Crofts, PA-C 12:00 PM

## 2021-03-28 ENCOUNTER — Telehealth: Payer: Self-pay

## 2021-03-28 ENCOUNTER — Ambulatory Visit: Payer: Medicare PPO | Admitting: Physician Assistant

## 2021-03-28 ENCOUNTER — Encounter: Payer: Self-pay | Admitting: Physician Assistant

## 2021-03-28 VITALS — BP 130/60 | HR 56 | Ht 70.5 in | Wt 235.0 lb

## 2021-03-28 DIAGNOSIS — I4891 Unspecified atrial fibrillation: Secondary | ICD-10-CM | POA: Diagnosis not present

## 2021-03-28 DIAGNOSIS — K222 Esophageal obstruction: Secondary | ICD-10-CM | POA: Diagnosis not present

## 2021-03-28 DIAGNOSIS — K219 Gastro-esophageal reflux disease without esophagitis: Secondary | ICD-10-CM | POA: Diagnosis not present

## 2021-03-28 DIAGNOSIS — Z7901 Long term (current) use of anticoagulants: Secondary | ICD-10-CM

## 2021-03-28 DIAGNOSIS — I420 Dilated cardiomyopathy: Secondary | ICD-10-CM

## 2021-03-28 NOTE — Telephone Encounter (Signed)
[facility] Pre-operative Risk Assessment  ?   ?Request for surgical clearance:     Endoscopy Procedure ? ?What type of surgery is being performed?     EGD ? ?When is this surgery scheduled?     04-20-2021 ? ?What type of clearance is required ?   Pharmacy ? ?Are there any medications that need to be held prior to surgery and how long? Yes, Coumadin../,./. ? ?Practice name and name of physician performing surgery?      [facility] ? ?What is your office phone and fax number?      Phone- [phone]  Fax- [phone] ? ?Anesthesia type (None, local, MAC, general) ?       MAC  ?

## 2021-03-28 NOTE — Patient Instructions (Addendum)
If you are age 69 or older, your body mass index should be between 23-30. Your Body mass index is 33.24 kg/m. If this is out of the aforementioned range listed, please consider follow up with your Primary Care Provider.  If you are age 74 or younger, your body mass index should be between 19-25. Your Body mass index is 33.24 kg/m. If this is out of the aformentioned range listed, please consider follow up with your Primary Care Provider.   ________________________________________________________  The [facility] providers would like to encourage you to use [facility] to communicate with providers for non-urgent requests or questions.  Due to long hold times on the telephone, sending your provider a message by [facility] may be a faster and more efficient way to get a response.  Please allow 48 business hours for a response.  Please remember that this is for non-urgent requests.  _______________________________________________________  [name] [name] have been scheduled for an endoscopy. Please follow written instructions given to you at your visit today. If you use inhalers (even only as needed), please bring them with you on the day of your procedure.  Due to recent changes in healthcare laws, you may see the results of your imaging and laboratory studies on MyChart before your provider has had a chance to review them.  We understand that in some cases there may be results that are confusing or concerning to you. Not all laboratory results come back in the same time frame and the provider may be waiting for multiple results in order to interpret others.  Please give [place] 48 hours in order for your provider to thoroughly review all the results before contacting the office for clarification of your results.    Please take your proton pump inhibitor medication 30 minutes to 1 hour before meals- this makes it more effective.  INCREASE TO TWICE A DAY FOR AT LEAST 2 WEEKS, STAY ON OMEPRAZOLE FOREVER Avoid spicy and  acidic foods Avoid fatty foods Limit your intake of coffee, tea, alcohol, and carbonated drinks Work to maintain a healthy weight Keep the head of the bed elevated at least 3 inches with blocks or a wedge pillow if you are having any nighttime symptoms Stay upright for 2 hours after eating Avoid meals and snacks three to four hours before bedtime   Gastroesophageal Reflux Disease, Adult Gastroesophageal reflux (GER) happens when acid from the stomach flows up into the tube that connects the mouth and the stomach (esophagus). Normally, food travels down the esophagus and stays in the stomach to be digested. However, when a person has GER, food and stomach acid sometimes move back up into the esophagus. If this becomes a more serious problem, the person may be diagnosed with a disease called gastroesophageal reflux disease (GERD). GERD occurs when the reflux: Happens often. Causes frequent or severe symptoms. Causes problems such as damage to the esophagus. When stomach acid comes in contact with the esophagus, the acid may cause inflammation in the esophagus. Over time, GERD may create small holes (ulcers) in the lining of the esophagus. What are the causes? This condition is caused by a problem with the muscle between the esophagus and the stomach (lower esophageal sphincter, or LES). Normally, the LES muscle closes after food passes through the esophagus to the stomach. When the LES is weakened or abnormal, it does not close properly, and that allows food and stomach acid to go back up into the esophagus. The LES can be weakened by certain dietary substances, medicines,  and medical conditions, including: Tobacco use. Pregnancy. Having a hiatal hernia. Alcohol use. Certain foods and beverages, such as coffee, chocolate, onions, and peppermint. What increases the risk? You are more likely to develop this condition if you: Have an increased body weight. Have a connective tissue  disorder. Take NSAIDs, such as ibuprofen. What are the signs or symptoms? Symptoms of this condition include: Heartburn. Difficult or painful swallowing and the feeling of having a lump in the throat. A bitter taste in the mouth. Bad breath and having a large amount of saliva. Having an upset or bloated stomach and belching. Chest pain. Different conditions can cause chest pain. Make sure you see your health care provider if you experience chest pain. Shortness of breath or wheezing. Ongoing (chronic) cough or a nighttime cough. Wearing away of tooth enamel. Weight loss. How is this diagnosed? This condition may be diagnosed based on a medical history and a physical exam. To determine if you have mild or severe GERD, your health care provider may also monitor how you respond to treatment. You may also have tests, including: A test to examine your stomach and esophagus with a small camera (endoscopy). A test that measures the acidity level in your esophagus. A test that measures how much pressure is on your esophagus. A barium swallow or modified barium swallow test to show the shape, size, and functioning of your esophagus. How is this treated? Treatment for this condition may vary depending on how severe your symptoms are. Your health care provider may recommend: Changes to your diet. Medicine. Surgery. The goal of treatment is to help relieve your symptoms and to prevent complications. Follow these instructions at home: Eating and drinking  Follow a diet as recommended by your health care provider. This may involve avoiding foods and drinks such as: Coffee and tea, with or without caffeine. Drinks that contain alcohol. Energy drinks and sports drinks. Carbonated drinks or sodas. Chocolate and cocoa. Peppermint and mint flavorings. Garlic and onions. Horseradish. Spicy and acidic foods, including peppers, chili powder, curry powder, vinegar, hot sauces, and barbecue  sauce. Citrus fruit juices and citrus fruits, such as oranges, lemons, and limes. Tomato-based foods, such as red sauce, chili, salsa, and pizza with red sauce. Fried and fatty foods, such as donuts, french fries, potato chips, and high-fat dressings. High-fat meats, such as hot dogs and fatty cuts of red and white meats, such as rib eye steak, sausage, ham, and bacon. High-fat dairy items, such as whole milk, butter, and cream cheese. Eat small, frequent meals instead of large meals. Avoid drinking large amounts of liquid with your meals. Avoid eating meals during the 2-3 hours before bedtime. Avoid lying down right after you eat. Do not exercise right after you eat. Lifestyle  Do not use any products that contain nicotine or tobacco. These products include cigarettes, chewing tobacco, and vaping devices, such as e-cigarettes. If you need help quitting, ask your health care provider. Try to reduce your stress by using methods such as yoga or meditation. If you need help reducing stress, ask your health care provider. If you are overweight, reduce your weight to an amount that is healthy for you. Ask your health care provider for guidance about a safe weight loss goal. General instructions Pay attention to any changes in your symptoms. Take over-the-counter and prescription medicines only as told by your health care provider. Do not take aspirin, ibuprofen, or other NSAIDs unless your health care provider told you to take these medicines.  Wear loose-fitting clothing. Do not wear anything tight around your waist that causes pressure on your abdomen. Raise (elevate) the head of your bed about 6 inches (15 cm). You can use a wedge to do this. Avoid bending over if this makes your symptoms worse. Keep all follow-up visits. This is important. Contact a health care provider if: You have: New symptoms. Unexplained weight loss. Difficulty swallowing or it hurts to swallow. Wheezing or a  persistent cough. A hoarse voice. Your symptoms do not improve with treatment. Get help right away if: You have sudden pain in your arms, neck, jaw, teeth, or back. You suddenly feel sweaty, dizzy, or light-headed. You have chest pain or shortness of breath. You vomit and the vomit is green, yellow, or black, or it looks like blood or coffee grounds. You faint. You have stool that is red, bloody, or black. You cannot swallow, drink, or eat. These symptoms may represent a serious problem that is an emergency. Do not wait to see if the symptoms will go away. Get medical help right away. Call your local emergency services (911 in the [place] Do not drive yourself to the hospital. Summary Gastroesophageal reflux happens when acid from the stomach flows up into the esophagus. GERD is a disease in which the reflux happens often, causes frequent or severe symptoms, or causes problems such as damage to the esophagus. Treatment for this condition may vary depending on how severe your symptoms are. Your health care provider may recommend diet and lifestyle changes, medicine, or surgery. Contact a health care provider if you have new or worsening symptoms. Take over-the-counter and prescription medicines only as told by your health care provider. Do not take aspirin, ibuprofen, or other NSAIDs unless your health care provider told you to do so. Keep all follow-up visits as told by your health care provider. This is important. This information is not intended to replace advice given to you by your health care provider. Make sure you discuss any questions you have with your health care provider. Document Revised: 07/26/2019 Document Reviewed: 07/26/2019 Elsevier Patient Education  [place].

## 2021-03-29 NOTE — Telephone Encounter (Signed)
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   ? ?Procedure: EGD ?Date of procedure: 04/20/21 ? ?Patient has afib listed on problem list in 2 encounters in 2011, however no longer in any other notes. I do not see any documentation of afib other than in coumadin clinic visits dating back to 2011. It is listed on his problem list (added by RN in coumadin clinic in 2012).  ?Unsure if this was documentation error or not. ?Patient has been previously bridged with lovenox. Always documented because of AVR. ?The guidelines now only recommend a bridge if there are other risk factors (such as afib or stroke).  ?Need to confirm if patient does or does not have afib to determine if bridge is needed. ?Also the guidelines recommend goal of 2.5-3.5 if there is an additional risk factor (currently goal is 2-3). ?I will ask Dr. Percival Spanish to comment. ?

## 2021-03-30 NOTE — Telephone Encounter (Signed)
? ?  Primary Cardiologist: Minus Breeding, MD ? ?Chart reviewed as part of pre-operative protocol coverage. Given past medical history and time since last visit, based on ACC/AHA guidelines, TRAVONTE BYARD would be at acceptable risk for the planned procedure without further cardiovascular testing.  ? ?His warfarin may be held for 5 days prior to his procedure.  He does not need a Lovenox bridge.  Please resume warfarin as soon as hemostasis is achieved. ? ?I will route this recommendation to the requesting party via Epic fax function and remove from pre-op pool. ? ?Please call with questions. ? ?Jossie Ng. Abigial Newville NP-C ? ?  ?03/30/2021, 8:45 AM ?[facility] ?[address] ?Office [phone] Fax [phone] ? ? ? ? ?

## 2021-03-30 NOTE — Telephone Encounter (Signed)
Patient may hold warfarin for 5 days prior to procedure. He does NOT need a lovenox bridge. ?

## 2021-04-02 ENCOUNTER — Ambulatory Visit: Payer: Medicare PPO | Admitting: Family Medicine

## 2021-04-02 ENCOUNTER — Encounter: Payer: Self-pay | Admitting: Family Medicine

## 2021-04-02 ENCOUNTER — Other Ambulatory Visit: Payer: Self-pay

## 2021-04-02 VITALS — BP 122/64 | HR 58 | Temp 97.5°F | Ht 70.5 in | Wt 235.0 lb

## 2021-04-02 DIAGNOSIS — E119 Type 2 diabetes mellitus without complications: Secondary | ICD-10-CM | POA: Diagnosis not present

## 2021-04-02 LAB — POCT GLYCOSYLATED HEMOGLOBIN (HGB A1C): Hemoglobin A1C: 6.9 % — AB (ref 4.0–5.6)

## 2021-04-02 MED ORDER — GLIPIZIDE 5 MG PO TABS: ORAL_TABLET | ORAL | 1 refills | Status: DC

## 2021-04-02 NOTE — Assessment & Plan Note (Signed)
See orders.  Continue metformin and glipizide.  See avs.  Update me if abd pain continues.  Routine cautions given to patient.  abd not ttp, not sx now.  A1c better.  Recheck in June.  Continue work on diet and exercise.  He agrees.   ?

## 2021-04-02 NOTE — Progress Notes (Signed)
Addendum: ?Reviewed and agree with assessment and management plan. ?Agree with plans for [facility] upper endoscopy with dilation; records reviewed and recent echo shows mildly reduced EF but above the threshold for LEC endoscopy. ?Warfarin will need to be held and will defer to Dr. Percival Spanish as to whether Lovenox bridge is recommended.  Please ensure that communication with Dr. Percival Spanish to determine warfarin with Lovenox bridge and holding anticoagulation for EGD is done appropriately prior to scheduled appointment. ?Addisynn Vassell, Lajuan Lines, MD ? ?

## 2021-04-02 NOTE — Progress Notes (Signed)
This visit occurred during the SARS-CoV-2 public health emergency.  Safety protocols were in place, including screening questions prior to the visit, additional usage of staff PPE, and extensive cleaning of exam room while observing appropriate contact time as indicated for disinfecting solutions. ? ?Diabetes:  ?Using medications without difficulties: yes ?Hypoglycemic episodes: no ?Hyperglycemic episodes: no ?Feet problems: no ?Blood Sugars averaging: usually ~130s, occ a littler higher.  None >200.   ?eye exam within last year: yes ?A1c 6.9.  sig better, d/w pt at [facility].   ?He has been taking 2 GLIPIZIDE pills per day.   ?2 metformin BID at baseline.   ?He is working on diet and weight is lower.   ? ?He had R sided abd discomfort yesterday but not o/w.  Single episode.  No pain since.  No FCNAVD.  Lasted about a few hours then got better.  No sx now. No black or bloody stools.  No complaints now.   ? ?Meds, vitals, and allergies reviewed.  ?ROS: Per HPI unless specifically indicated in ROS section  ? ?GEN: nad, alert and oriented ?HEENT: ncat ?NECK: supple w/o LA ?CV: rrr. ?PULM: ctab, no inc wob, abd not ttp.   ?ABD: soft, +bs ?EXT: no edema ?SKIN: well perfused.   ?

## 2021-04-02 NOTE — Patient Instructions (Addendum)
Thanks for your effort.  ?Take care.  Glad to see you. ?If you have more abdominal pain then let me know.   ?Recheck in June with A1c at the visit.  ?Update me as needed.   ?

## 2021-04-20 ENCOUNTER — Ambulatory Visit (AMBULATORY_SURGERY_CENTER): Payer: Medicare PPO | Admitting: Internal Medicine

## 2021-04-20 ENCOUNTER — Encounter: Payer: Self-pay | Admitting: Internal Medicine

## 2021-04-20 VITALS — BP 120/63 | HR 55 | Temp 96.2°F | Resp 14 | Ht 70.0 in | Wt 235.0 lb

## 2021-04-20 DIAGNOSIS — R1310 Dysphagia, unspecified: Secondary | ICD-10-CM

## 2021-04-20 DIAGNOSIS — K222 Esophageal obstruction: Secondary | ICD-10-CM

## 2021-04-20 DIAGNOSIS — K219 Gastro-esophageal reflux disease without esophagitis: Secondary | ICD-10-CM

## 2021-04-20 DIAGNOSIS — K2970 Gastritis, unspecified, without bleeding: Secondary | ICD-10-CM

## 2021-04-20 DIAGNOSIS — K31819 Angiodysplasia of stomach and duodenum without bleeding: Secondary | ICD-10-CM | POA: Diagnosis not present

## 2021-04-20 MED ORDER — SODIUM CHLORIDE 0.9 % IV SOLN: 500.0000 mL | Freq: Once | INTRAVENOUS | Status: DC

## 2021-04-20 NOTE — Progress Notes (Signed)
A and O x3. Report to RN. Tolerated MAC anesthesia well.Teeth unchanged after procedure. 

## 2021-04-20 NOTE — Patient Instructions (Addendum)
? ? ?FOLLOW DILATATION DIET GIVEN TO YOU TODAY !! ? ?Continue present medications including Omeprazole 20 mg daily ? ?RESUME COUMADIN AT PRIOR DOSE TODAY,REFER TO COUMADIN CLINIC FOR FURTHER ADJUSTMENT OF THERAPY ? ?AWAIT BIOPSY RESULTS OF STOMACH  ? ?HANDOUT ON GASTRITIS GIVEN TO YOU TODAY ? ? ?YOU HAD AN ENDOSCOPIC PROCEDURE TODAY AT [facility]:   Refer to the procedure report that was given to you for any specific questions about what was found during the examination.  If the procedure report does not answer your questions, please call your gastroenterologist to clarify.  If you requested that your care partner not be given the details of your procedure findings, then the procedure report has been included in a sealed envelope for you to review at your convenience later. ? ?YOU SHOULD EXPECT: Some feelings of bloating in the abdomen. Passage of more gas than usual.  Walking can help get rid of the air that was put into your GI tract during the procedure and reduce the bloating. If you had a lower endoscopy (such as a colonoscopy or flexible sigmoidoscopy) you may notice spotting of blood in your stool or on the toilet paper. If you underwent a bowel prep for your procedure, you may not have a normal bowel movement for a few days. ? ?Please Note:  You might notice some irritation and congestion in your nose or some drainage.  This is from the oxygen used during your procedure.  There is no need for concern and it should clear up in a day or so. ? ?SYMPTOMS TO REPORT IMMEDIATELY: ? ? ?Following upper endoscopy (EGD) ? Vomiting of blood or coffee ground material ? New chest pain or pain under the shoulder blades ? Painful or persistently difficult swallowing ? New shortness of breath ? Fever of 100?F or higher ? Black, tarry-looking stools ? ?For urgent or emergent issues, a gastroenterologist can be reached at any hour by calling [phone]. ?Do not use MyChart messaging for urgent concerns.   ? ? ?DIET:  FOLLOW DILATATION DIET GIVEN TO YOU TODAY. Drink plenty of fluids but you should avoid alcoholic beverages for 24 hours. ? ?ACTIVITY:  You should plan to take it easy for the rest of today and you should NOT DRIVE or use heavy machinery until tomorrow (because of the sedation medicines used during the test).   ? ?FOLLOW UP: ?Our staff will call the number listed on your records 48-72 hours following your procedure to check on you and address any questions or concerns that you may have regarding the information given to you following your procedure. If we do not reach you, we will leave a message.  We will attempt to reach you two times.  During this call, we will ask if you have developed any symptoms of COVID 19. If you develop any symptoms (ie: fever, flu-like symptoms, shortness of breath, cough etc.) before then, please call [phone].  If you test positive for Covid 19 in the 2 weeks post procedure, please call and report this information to [place].   ? ?If any biopsies were taken you will be contacted by phone or by letter within the next 1-3 weeks.  Please call us at [phone] if you have not heard about the biopsies in 3 weeks.  ? ? ?SIGNATURES/CONFIDENTIALITY: ?You and/or your care partner have signed paperwork which will be entered into your electronic medical record.  These signatures attest to the fact that that the information above on  your After Visit Summary has been reviewed and is understood.  Full responsibility of the confidentiality of this discharge information lies with you and/or your care-partner.  ? ? ? ? ? ?

## 2021-04-20 NOTE — Op Note (Signed)
[facility] ?Patient Name: [name] [name] ?Procedure Date: 04/20/2021 9:27 AM ?MRN: [id number] ?Endoscopist: Jerene Bears , MD ?Age: 71 ?Referring MD:  ?Date of Birth: Dec 04, 1950 ?Gender: Male ?Account #: [id number] ?Procedure:                Upper GI endoscopy ?Indications:              Dysphagia, For therapy of esophageal stenosis ?Medicines:                Monitored Anesthesia Care ?Procedure:                Pre-Anesthesia Assessment: ?                          - Prior to the procedure, a History and Physical  ?                          was performed, and patient medications and  ?                          allergies were reviewed. The patient's tolerance of  ?                          previous anesthesia was also reviewed. The risks  ?                          and benefits of the procedure and the sedation  ?                          options and risks were discussed with the patient.  ?                          All questions were answered, and informed consent  ?                          was obtained. Prior Anticoagulants: The patient has  ?                          taken Coumadin (warfarin), last dose was 5 days  ?                          prior to procedure. ASA Grade Assessment: III - A  ?                          patient with severe systemic disease. After  ?                          reviewing the risks and benefits, the patient was  ?                          deemed in satisfactory condition to undergo the  ?                          procedure. ?  After obtaining informed consent, the endoscope was  ?                          passed under direct vision. Throughout the  ?                          procedure, the patient's blood pressure, pulse, and  ?                          oxygen saturations were monitored continuously. The  ?                          Endoscope was introduced through the mouth, and  ?                          advanced to the second part of duodenum. The  upper  ?                          GI endoscopy was accomplished without difficulty.  ?                          The patient tolerated the procedure well. ?Scope In: ?Scope Out: ?Findings:                 Three benign-appearing, intrinsic moderate  ?                          (circumferential scarring or stenosis; an endoscope  ?                          may pass) stenoses were found 41 to 43 cm from the  ?                          incisors. The narrowest stenosis measured 9 mm  ?                          (inner diameter) x 3 cm (in length). The stenoses  ?                          were traversed. The dilation site was examined and  ?                          showed moderate mucosal disruption and moderate  ?                          improvement in luminal narrowing. ?                          The exam of the esophagus was otherwise normal.  ?                          Previously seen esophagitis has healed. ?  Diffuse moderate inflammation characterized by  ?                          congestion (edema), erythema and granularity was  ?                          found in the entire examined stomach. Biopsies were  ?                          taken with a cold forceps for histology and  ?                          Helicobacter pylori testing. ?                          The examined duodenum was normal. ?Complications:            No immediate complications. ?Estimated Blood Loss:     Estimated blood loss was minimal. ?Impression:               - Benign-appearing esophageal stenoses. ?                          - Gastritis. Biopsied. ?                          - Normal examined duodenum. ?Recommendation:           - Patient has a contact number available for  ?                          emergencies. The signs and symptoms of potential  ?                          delayed complications were discussed with the  ?                          patient. Return to normal activities tomorrow.  ?                           Written discharge instructions were provided to the  ?                          patient. ?                          - Post-dilation diet and then advance diet as  ?                          tolerated. Soft diet max today and then advance as  ?                          tolerated. ?                          - Continue present medications including omeprazole  ?  20 mg daily. ?                          - Resume Coumadin (warfarin) at prior dose today.  ?                          Refer to Coumadin Clinic for further adjustment of  ?                          therapy. ?                          - Await pathology results. ?                          - Repeat upper endoscopy as needed for retreatment. ?Jerene Bears, MD ?04/20/2021 10:01:19 AM ?This report has been signed electronically. ?

## 2021-04-20 NOTE — Progress Notes (Signed)
Called to room to assist during endoscopic procedure.  Patient ID and intended procedure confirmed with present staff. Received instructions for my participation in the procedure from the performing physician.  

## 2021-04-20 NOTE — Progress Notes (Signed)
Please see office note dated 03/28/2021 for details ?Patient presenting for upper endoscopy with probable dilation.  History of esophageal stricture and recent food impaction ? ?He remains appropriate for upper endoscopy in the [facility] today ?

## 2021-04-20 NOTE — Progress Notes (Signed)
VS by DT    

## 2021-04-22 ENCOUNTER — Other Ambulatory Visit: Payer: Self-pay | Admitting: Family Medicine

## 2021-04-24 ENCOUNTER — Telehealth: Payer: Self-pay

## 2021-04-24 NOTE — Telephone Encounter (Signed)
?  Follow up Call- ? ? ?  04/20/2021  ?  8:54 AM  ?Call back number  ?Post procedure Call Back phone  # [phone]  ?Permission to leave phone message Yes  ?  ? ?Patient questions: ? ?Do you have a fever, pain , or abdominal swelling? No. ?Pain Score  0 * ? ?Have you tolerated food without any problems? Yes.   ? ?Have you been able to return to your normal activities? Yes.   ? ?Do you have any questions about your discharge instructions: ?Diet   No. ?Medications  No. ?Follow up visit  No. ? ?Do you have questions or concerns about your Care? No. ? ?Actions: ?* If pain score is 4 or above: ?No action needed, pain <4. ? ? ?

## 2021-04-30 ENCOUNTER — Encounter: Payer: Self-pay | Admitting: Internal Medicine

## 2021-05-03 ENCOUNTER — Ambulatory Visit (INDEPENDENT_AMBULATORY_CARE_PROVIDER_SITE_OTHER): Payer: Medicare PPO

## 2021-05-03 DIAGNOSIS — Z952 Presence of prosthetic heart valve: Secondary | ICD-10-CM

## 2021-05-03 DIAGNOSIS — Z7901 Long term (current) use of anticoagulants: Secondary | ICD-10-CM

## 2021-05-03 DIAGNOSIS — I4891 Unspecified atrial fibrillation: Secondary | ICD-10-CM

## 2021-05-03 LAB — POCT INR: INR: 1.9 — AB (ref 2.0–3.0)

## 2021-05-03 NOTE — Patient Instructions (Addendum)
Pre visit review using our clinic review tool, if applicable. No additional management support is needed unless otherwise documented below in the visit note. ? ?Increase dose today to take 6 mg and then continue to take 4 mg daily except 2 mg on Monday and Fridays.  Recheck in 2 weeks. ? ?

## 2021-05-03 NOTE — Progress Notes (Addendum)
Pt started Prilosec about 1 week ago after his procedure. Pt had a 5 day hold on warfarin. Coumadin clinic was not aware of request to stop warfarin. Cardiology authorized hold. Pt will be back in 2 weeks due to interaction with Prilosec.  ?Increase dose today to take 6 mg and then continue to take 4 mg daily except 2 mg on Monday and Fridays.  Recheck in 2 weeks. ?

## 2021-05-07 ENCOUNTER — Other Ambulatory Visit: Payer: Self-pay | Admitting: Family Medicine

## 2021-05-07 DIAGNOSIS — Z7901 Long term (current) use of anticoagulants: Secondary | ICD-10-CM

## 2021-05-08 NOTE — Telephone Encounter (Signed)
Pt is complaint with warfarin management and PCP apts.  ?Sent in refill. ?

## 2021-05-14 ENCOUNTER — Ambulatory Visit: Payer: Medicare PPO | Admitting: Dermatology

## 2021-05-14 DIAGNOSIS — L719 Rosacea, unspecified: Secondary | ICD-10-CM

## 2021-05-14 DIAGNOSIS — L578 Other skin changes due to chronic exposure to nonionizing radiation: Secondary | ICD-10-CM | POA: Diagnosis not present

## 2021-05-14 DIAGNOSIS — Z1283 Encounter for screening for malignant neoplasm of skin: Secondary | ICD-10-CM | POA: Diagnosis not present

## 2021-05-14 DIAGNOSIS — D692 Other nonthrombocytopenic purpura: Secondary | ICD-10-CM

## 2021-05-14 DIAGNOSIS — I781 Nevus, non-neoplastic: Secondary | ICD-10-CM

## 2021-05-14 DIAGNOSIS — L853 Xerosis cutis: Secondary | ICD-10-CM

## 2021-05-14 DIAGNOSIS — L820 Inflamed seborrheic keratosis: Secondary | ICD-10-CM | POA: Diagnosis not present

## 2021-05-14 DIAGNOSIS — L918 Other hypertrophic disorders of the skin: Secondary | ICD-10-CM | POA: Diagnosis not present

## 2021-05-14 DIAGNOSIS — L570 Actinic keratosis: Secondary | ICD-10-CM

## 2021-05-14 DIAGNOSIS — L817 Pigmented purpuric dermatosis: Secondary | ICD-10-CM | POA: Diagnosis not present

## 2021-05-14 DIAGNOSIS — D1800 Hemangioma unspecified site: Secondary | ICD-10-CM

## 2021-05-14 DIAGNOSIS — L814 Other melanin hyperpigmentation: Secondary | ICD-10-CM

## 2021-05-14 NOTE — Patient Instructions (Addendum)
Seborrheic Keratosis ? ?What causes seborrheic keratoses? ?Seborrheic keratoses are harmless, common skin growths that first appear during adult life.  As time goes by, more growths appear.  Some people may develop a large number of them.  Seborrheic keratoses appear on both covered and uncovered body parts.  They are not caused by sunlight.  The tendency to develop seborrheic keratoses can be inherited.  They vary in color from skin-colored to gray, brown, or even black.  They can be either smooth or have a rough, warty surface.   ?Seborrheic keratoses are superficial and look as if they were stuck on the skin.  Under the microscope this type of keratosis looks like layers upon layers of skin.  That is why at times the top layer may seem to fall off, but the rest of the growth remains and re-grows.   ? ?Treatment ?Seborrheic keratoses do not need to be treated, but can easily be removed in the office.  Seborrheic keratoses often cause symptoms when they rub on clothing or jewelry.  Lesions can be in the way of shaving.  If they become inflamed, they can cause itching, soreness, or burning.  Removal of a seborrheic keratosis can be accomplished by freezing, burning, or surgery. ?If any spot bleeds, scabs, or grows rapidly, please return to have it checked, as these can be an indication of a skin cancer. ? ?Cryotherapy Aftercare ? ?Wash gently with soap and water everyday.   ?Apply Vaseline and Band-Aid daily until healed.  ? ?Actinic keratoses are precancerous spots that appear secondary to cumulative UV radiation exposure/sun exposure over time. They are chronic with expected duration over 1 year. A portion of actinic keratoses will progress to squamous cell carcinoma of the skin. It is not possible to reliably predict which spots will progress to skin cancer and so treatment is recommended to prevent development of skin cancer. ? ?Recommend daily broad spectrum sunscreen SPF 30+ to sun-exposed areas, reapply every  2 hours as needed.  ?Recommend staying in the shade or wearing long sleeves, sun glasses (UVA+UVB protection) and wide brim hats (4-inch brim around the entire circumference of the hat). ?Call for new or changing lesions.  ? ?Rosacea ? ?What is rosacea? ?Rosacea (say: ro-zay-sha) is a common skin disease that usually begins as a trend of flushing or blushing easily.  As rosacea progresses, a persistent redness in the center of the face will develop and may gradually spread beyond the nose and cheeks to the forehead and chin.  In some cases, the ears, chest, and back could be affected.  Rosacea may appear as tiny blood vessels or small red bumps that occur in crops.  Frequently they can contain pus, and are called ?pustules?.  If the bumps do not contain pus, they are referred to as ?papules?.  Rarely, in prolonged, untreated cases of rosacea, the oil glands of the nose and cheeks may become permanently enlarged.  This is called rhinophyma, and is seen more frequently in men. ? ?Signs and Risks ?In its beginning stages, rosacea tends to come and go, which makes it difficult to recognize.  It can start as intermittent flushing of the face.  Eventually, blood vessels may become permanently visible.  Pustules and papules can appear, but can be mistaken for adult acne.  People of all races, ages, genders and ethnic groups are at risk of developing rosacea.  However, it is more common in women (especially around menopause) and adults with fair skin between the ages of  30 and 50. ? ?Treatment ?Dermatologists typically recommend a combination of treatments to effectively manage rosacea.  Treatment can improve symptoms and may stop the progression of the rosacea.  Treatment may involve both topical and oral medications.  The tetracycline antibiotics are often used for their anti-inflammatory effect; however, because of the possibility of developing antibiotic resistance, they should not be used long term at full dose.  For  dilated blood vessels the options include electrodessication (uses electric current through a small needle), laser treatment, and cosmetics to hide the redness.   ?With all forms of treatment, improvement is a slow process, and patients may not see any results for the first 3-4 weeks.  It is very important to avoid the sun and other triggers.  Patients must wear sunscreen daily. ? ?Skin Care Instructions: ?Cleanse the skin with a mild soap such as CeraVe cleanser, Cetaphil cleanser, or Dove soap once or twice daily as needed. ?Moisturize with Eucerin Redness Relief Daily Perfecting Lotion (has a subtle green tint), CeraVe Moisturizing Cream, or Oil of Olay Daily Moisturizer with sunscreen every morning and/or night as recommended. ?Makeup should be ?non-comedogenic? (won?t clog pores) and be labeled ?for sensitive skin?Kermit Balo choices for cosmetics are: Neutrogena, Almay, and Physician?s Formula.  Any product with a green tint tends to offset a red complexion. ?If your eyes are dry and irritated, use artificial tears 2-3 times per day and cleanse the eyelids daily with baby shampoo.  Have your eyes examined at least every 2 years.  Be sure to tell your eye doctor that you have rosacea. ?Alcoholic beverages tend to cause flushing of the skin, and may make rosacea worse. ?Always wear sunscreen, protect your skin from extreme hot and cold temperatures, and avoid spicy foods, hot drinks, and mechanical irritation such as rubbing, scrubbing, or massaging the face.  Avoid harsh skin cleansers, cleansing masks, astringents, and exfoliation. If a particular product burns or makes your face feel tight, then it is likely to flare your rosacea. ?If you are having difficulty finding a sunscreen that you can tolerate, you may try switching to a chemical-free sunscreen.  These are ones whose active ingredient is zinc oxide or titanium dioxide only.  They should also be fragrance free, non-comedogenic, and labeled for sensitive  skin. ?Rosacea triggers may vary from person to person.  There are a variety of foods that have been reported to trigger rosacea.  Some patients find that keeping a diary of what they were doing when they flared helps them avoid triggers. ? ? ? ? ?Recommend cerave cream as a good moisturizer for dry skin  ? ?Gentle Skin Care Guide ? ?1. Bathe no more than once a day. ? ?2. Avoid bathing in hot water ? ?3. Use a mild soap like Dove, Vanicream, Cetaphil, CeraVe. Can use Lever 2000 or Cetaphil antibacterial soap ? ?4. Use soap only where you need it. On most days, use it under your arms, between your legs, and on your feet. Let the water rinse other areas unless visibly dirty. ? ?5. When you get out of the bath/shower, use a towel to gently blot your skin dry, don't rub it. ? ?6. While your skin is still a little damp, apply a moisturizing cream such as Vanicream, CeraVe, Cetaphil, Eucerin, Sarna lotion or plain Vaseline Jelly. For hands apply Neutrogena [place] Hand Cream or Excipial Hand Cream. ? ?7. Reapply moisturizer any time you start to itch or feel dry. ? ?8. Sometimes using free and clear laundry  detergents can be helpful. Fabric softener sheets should be avoided. Downy Free & Gentle liquid, or any liquid fabric softener that is free of dyes and perfumes, it acceptable to use ? ?9. If your doctor has given you prescription creams you may apply moisturizers over them  ? ? ? ? ? ? ? ?If You Need Anything After Your Visit ? ?If you have any questions or concerns for your doctor, please call our main line at [phone] and press option 4 to reach your doctor's medical assistant. If no one answers, please leave a voicemail as directed and we will return your call as soon as possible. Messages left after 4 pm will be answered the following business day.  ? ?You may also send [place] a message via MyChart. We typically respond to MyChart messages within 1-2 business days. ? ?For prescription refills, please ask your  pharmacy to contact our office. Our fax number is [phone]. ? ?If you have an urgent issue when the clinic is closed that cannot wait until the next business day, you can page your doctor at the numbe

## 2021-05-14 NOTE — Progress Notes (Signed)
? ?New Patient Visit ? ?Subjective  ?[name] [name] is a 71 y.o. male who presents for the following: New Patient (Initial Visit) (Concerning about spots at b/l arms, and skin tags under arms that he reports bothers him./). ?The patient presents for Upper Body Skin Exam (UBSE) for skin cancer screening and mole check.  The patient has spots, moles and lesions to be evaluated, some may be new or changing and the patient has concerns that these could be cancer. ? ?The following portions of the chart were reviewed this encounter and updated as appropriate:  ? Tobacco  Allergies  Meds  Problems  Med Hx  Surg Hx  Fam Hx   ?  ?Review of Systems:  No other skin or systemic complaints except as noted in HPI or Assessment and Plan. ? ?Objective  ?Well appearing patient in no apparent distress; mood and affect are within normal limits. ? ?All skin waist up examined. ? ?right arm x 1, left elbow x 1, left anterior shoulder x 1, left top of shoulder x 1, right posterior shoulder x 1 (5) ?Erythematous stuck-on, waxy papule or plaque ? ?right earlobe x 1 ?Erythematous thin papules/macules with gritty scale.  ? ?b/l axilla x 12 (12) ?Fleshy, skin-colored pedunculated papules.  ? ? ? ?Assessment & Plan  ?Inflamed seborrheic keratosis (5) ?right arm x 1, left elbow x 1, left anterior shoulder x 1, left top of shoulder x 1, right posterior shoulder x 1 ? ?Irritated and bothering patient  ? ?Destruction of lesion - right arm x 1, left elbow x 1, left anterior shoulder x 1, left top of shoulder x 1, right posterior shoulder x 1 ?Complexity: simple   ?Destruction method: cryotherapy   ?Informed consent: discussed and consent obtained   ?Timeout:  patient name, date of birth, surgical site, and procedure verified ?Lesion destroyed using liquid nitrogen: Yes   ?Region frozen until ice ball extended beyond lesion: Yes   ?Outcome: patient tolerated procedure well with no complications   ?Post-procedure details: wound care  instructions given   ?Additional details:  Prior to procedure, discussed risks of blister formation, small wound, skin dyspigmentation, or rare scar following cryotherapy. Recommend Vaseline ointment to treated areas while healing. ? 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  Recommend daily graduated compression hose/stockings- easiest to put on first thing in morning, remove at bedtime.  Elevate legs as much as possible. Avoid salt/sodium rich foods. ? ?Rosacea ?Head - Anterior (Face) ?Rosacea is a chronic progressive skin condition usually affecting the face of adults, causing redness and/or acne bumps. It is treatable but not curable. It sometimes affects the eyes (ocular rosacea) as well. It may respond to topical and/or systemic medication and can flare with stress, sun exposure, alcohol, exercise and some foods.  Daily application of broad spectrum spf 30+ sunscreen to face is recommended to reduce flares. ? ?Discussed the treatment option of BBL/laser.  Typically we recommend 1-3 treatment sessions about 5-8 weeks apart for best results.  The patient's condition may require "maintenance treatments" in the future.  The fee for BBL / laser treatments is $350 per treatment session for the whole face.  A fee can be quoted for other parts of the body. ?Insurance typically does not pay for BBL/laser treatments and therefore the fee is an out-of-pocket cost. ? ?Actinic keratosis ?right earlobe  x 1 ? ?Actinic keratoses are precancerous spots that appear secondary to cumulative UV radiation exposure/sun exposure over time. They are chronic with expected duration over 1 year. A portion of actinic keratoses will progress to squamous cell carcinoma of the skin. It is not possible to reliably predict which spots will progress to skin  cancer and so treatment is recommended to prevent development of skin cancer. ? ?Recommend daily broad spectrum sunscreen SPF 30+ to sun-exposed areas, reapply every 2 hours as needed.  ?Recommend staying in the shade or wearing long sleeves, sun glasses (UVA+UVB protection) and wide brim hats (4-inch brim around the entire circumference of the hat). ?Call for new or changing lesions. ? ?Destruction of lesion - right earlobe x 1 ?Complexity: simple   ?Destruction method: cryotherapy   ?Informed consent: discussed and consent obtained   ?Timeout:  patient name, date of birth, surgical site, and procedure verified ?Lesion destroyed using liquid nitrogen: Yes   ?Region frozen until ice ball extended beyond lesion: Yes   ?Outcome: patient tolerated procedure well with no complications   ?Post-procedure details: wound care instructions given   ?Additional details:  Prior to procedure, discussed risks of blister formation, small wound, skin dyspigmentation, or rare scar following cryotherapy. Recommend Vaseline ointment to treated areas while healing. ? ?Skin tag (12) ?b/l axilla x 12 ?Irritated and bothers patient ? ?Procedure: Skin tag removal ?Informed consent:  Discussed risks (permanent scarring, infection, pain, bleeding, bruising, redness, and recurrence of the lesion) and benefits of the procedure, as well as the alternatives.  Patient is aware that skin tags are benign lesions, and their removal is often not considered medically necessary.  Informed consent was obtained. ?The area was prepared with isopropyl alcohol. ?Anesthesia:  lidocaine 1% with epinephrine was injected to achieve good local anesthesia ?Snip removal was performed.   ?Antibiotic ointment and a sterile dressing were applied.   ?The patient tolerated procedure well. ?The patient was instructed on post-op care.   ?Number of lesions removed:  12 skin tags b/l axilla  ? ?Destruction of lesion - b/l axilla x 12 ?Complexity: simple   ?Destruction  method: cryotherapy   ?Informed consent: discussed and consent obtained   ?Timeout:  patient name, date of birth, surgical site, and procedure verified ?Lesion destroyed using liquid nitrogen: Yes   ?Region frozen until ice ball extended beyond lesion: Yes   ?Outcome: patient tolerated procedure well with no complications   ?Post-procedure details: wound care instructions given   ?Additional details:  Prior to procedure, discussed risks of blister formation, small wound, skin dyspigmentation, or rare scar following cryotherapy. Recommend Vaseline ointment to treated areas while healing. ? ?Seborrheic Keratoses ?- Stuck-on, waxy, tan-brown papules and/or plaques  ?- Benign-appearing ?- Discussed benign etiology and prognosis. ?- Observe ?- Call for any changes ? ?Purpura - Chronic; persistent and recurrent.  Treatable, but not curable. ?- Violaceous macules and patches ?- Benign ?- Related to trauma, age, sun damage and/or use of blood thinners, chronic use of topical and/or oral steroids ?- Observe ?- Can use OTC arnica containing moisturizer such as Dermend Bruise Formula if desired ?- Call for worsening or other concerns ? ?Lentigines ?- Scattered tan macules ?- Due to sun exposure ?- Benign-appering, observe ?- Recommend daily broad spectrum sunscreen SPF 30+ to sun-exposed areas, reapply every 2 hours as needed. ?- Call for any changes ? ?Hemangiomas ?- Red papules ?- Discussed benign nature ?- Observe ?- Call for any changes ? ?Actinic Damage ?- chronic, secondary to  cumulative UV radiation exposure/sun exposure over time ?- diffuse scaly erythematous macules with underlying dyspigmentation ?- Recommend daily broad spectrum sunscreen SPF 30+ to sun-exposed areas, reapply every 2 hours as needed.  ?- Recommend staying in the shade or wearing long sleeves, sun glasses (UVA+UVB protection) and wide brim hats (4-inch brim around the entire circumference of the hat). ?- Call for new or changing  lesions. ? ?Xerosis ?- diffuse xerotic patches at legs ?- recommend gentle, hydrating skin care ?- gentle skin care handout given ? ?Return if symptoms worsen or fail to improve. ? ?I, Ruthell Rummage, CMA, am acting as scribe for [employer]

## 2021-05-17 ENCOUNTER — Ambulatory Visit: Payer: Medicare PPO

## 2021-05-17 DIAGNOSIS — Z7901 Long term (current) use of anticoagulants: Secondary | ICD-10-CM

## 2021-05-17 LAB — POCT INR: INR: 1.8 — AB (ref 2.0–3.0)

## 2021-05-17 NOTE — Progress Notes (Signed)
Pt started Prilosec about 2 week ago.  Increase dose today to take 6 mg and then change weekly dose to take 4 mg daily except take 2 mg on Fridays. Recheck in 4 weeks. ?

## 2021-05-17 NOTE — Patient Instructions (Addendum)
Pre visit review using our clinic review tool, if applicable. No additional management support is needed unless otherwise documented below in the visit note. ?  ?Increase dose today to take 6 mg and then change weekly dose to take 4 mg daily except take 2 mg on Fridays. Recheck in 4 weeks. ?

## 2021-05-21 ENCOUNTER — Encounter: Payer: Self-pay | Admitting: Dermatology

## 2021-06-05 DIAGNOSIS — H5213 Myopia, bilateral: Secondary | ICD-10-CM | POA: Diagnosis not present

## 2021-06-05 DIAGNOSIS — E119 Type 2 diabetes mellitus without complications: Secondary | ICD-10-CM | POA: Diagnosis not present

## 2021-06-05 DIAGNOSIS — H524 Presbyopia: Secondary | ICD-10-CM | POA: Diagnosis not present

## 2021-06-05 DIAGNOSIS — H2513 Age-related nuclear cataract, bilateral: Secondary | ICD-10-CM | POA: Diagnosis not present

## 2021-06-05 LAB — HM DIABETES EYE EXAM

## 2021-06-07 ENCOUNTER — Ambulatory Visit: Payer: Medicare PPO

## 2021-06-07 DIAGNOSIS — Z7901 Long term (current) use of anticoagulants: Secondary | ICD-10-CM

## 2021-06-07 DIAGNOSIS — I4891 Unspecified atrial fibrillation: Secondary | ICD-10-CM

## 2021-06-07 DIAGNOSIS — Z952 Presence of prosthetic heart valve: Secondary | ICD-10-CM

## 2021-06-07 LAB — POCT INR: INR: 3 (ref 2.0–3.0)

## 2021-06-07 NOTE — Progress Notes (Addendum)
Continue 4 mg daily except take 2 mg on Fridays. Recheck in 6 weeks per pt request. ?

## 2021-06-07 NOTE — Patient Instructions (Addendum)
Pre visit review using our clinic review tool, if applicable. No additional management support is needed unless otherwise documented below in the visit note. ? ?Continue 4 mg daily except take 2 mg on Fridays. Recheck in 6 weeks. ?

## 2021-06-14 ENCOUNTER — Ambulatory Visit: Payer: Medicare PPO

## 2021-06-18 ENCOUNTER — Other Ambulatory Visit: Payer: Self-pay | Admitting: Cardiology

## 2021-07-02 ENCOUNTER — Encounter: Payer: Self-pay | Admitting: Family Medicine

## 2021-07-02 ENCOUNTER — Ambulatory Visit: Payer: Medicare PPO | Admitting: Family Medicine

## 2021-07-02 VITALS — BP 122/72 | HR 57 | Temp 97.3°F | Ht 70.0 in | Wt 237.0 lb

## 2021-07-02 DIAGNOSIS — E119 Type 2 diabetes mellitus without complications: Secondary | ICD-10-CM

## 2021-07-02 DIAGNOSIS — M7060 Trochanteric bursitis, unspecified hip: Secondary | ICD-10-CM | POA: Diagnosis not present

## 2021-07-02 DIAGNOSIS — Z952 Presence of prosthetic heart valve: Secondary | ICD-10-CM | POA: Diagnosis not present

## 2021-07-02 LAB — POCT GLYCOSYLATED HEMOGLOBIN (HGB A1C): Hemoglobin A1C: 7.7 % — AB (ref 4.0–5.6)

## 2021-07-02 MED ORDER — AMOXICILLIN 500 MG PO TABS
2000.0000 mg | ORAL_TABLET | Freq: Every day | ORAL | 0 refills | Status: DC | PRN
Start: 2021-07-02 — End: 2022-04-04

## 2021-07-02 NOTE — Progress Notes (Unsigned)
L hip pain, discomfort laying on L side.  Started last night.  Pain near L greater troch and ttp locally.  Normal ROM o/w.  No trauma.    Diabetes:  Using medications without difficulties: metformin, glipizide.  No insulin use.   Hypoglycemic episodes: no Hyperglycemic episodes: no Feet problems:  no new changes.   Blood Sugars averaging: higher on vacation when off diet.  Now usually ~160s recently, down to 140s this AM.   eye exam within last year: yes He thought that he could still improve his diet. D/w pt.    H/o aortic valve replacement.  On coumadin.  Can tolerate amoxil, d/w pt.  Dental visit tomorrow.  No bleeding.    Meds, vitals, and allergies reviewed.   ROS: Per HPI unless specifically indicated in ROS section   GEN: nad, alert and oriented HEENT: ncat NECK: supple w/o LA CV: rrr. Valve click noted at baseline.   PULM: ctab, no inc wob ABD: soft, +bs EXT: no edema SKIN: well perfused.   Pain near L greater troch and ttp locally.  Normal ROM o/w.  No trauma.   Able to bear weight.

## 2021-07-02 NOTE — Patient Instructions (Addendum)
Plan on recheck in about 3 months with A1c at the visit.  Keep working on diet in the meantime.  Ice you hip for 5 minutes at a time.  Update me as needed.  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    Take care.  Glad to see you.

## 2021-07-04 DIAGNOSIS — M7060 Trochanteric bursitis, unspecified hip: Secondary | ICD-10-CM | POA: Insufficient documentation

## 2021-07-04 NOTE — Assessment & Plan Note (Signed)
Discussed icing for 5 minutes at a time and update me as needed.  Anatomy discussed with patient.

## 2021-07-04 NOTE — Assessment & Plan Note (Signed)
Continue metformin and glipizide.  He is going to work more on diet.  Recheck periodically.  See after visit summary.

## 2021-07-04 NOTE — Assessment & Plan Note (Signed)
Amoxicillin prescription sent.  Discussed use before dental visits.

## 2021-07-19 ENCOUNTER — Ambulatory Visit: Payer: Medicare PPO

## 2021-07-19 DIAGNOSIS — Z952 Presence of prosthetic heart valve: Secondary | ICD-10-CM

## 2021-07-19 DIAGNOSIS — I4891 Unspecified atrial fibrillation: Secondary | ICD-10-CM

## 2021-07-19 DIAGNOSIS — Z7901 Long term (current) use of anticoagulants: Secondary | ICD-10-CM

## 2021-07-19 LAB — POCT INR: INR: 3.1 — AB (ref 2.0–3.0)

## 2021-07-19 NOTE — Patient Instructions (Addendum)
Pre visit review using our clinic review tool, if applicable. No additional management support is needed unless otherwise documented below in the visit note.  Reduce dose today to take 2 mg and the continue 4 mg daily except take 2 mg on Fridays. Recheck in 6 weeks.

## 2021-08-06 ENCOUNTER — Other Ambulatory Visit: Payer: Self-pay | Admitting: Family Medicine

## 2021-08-06 DIAGNOSIS — Z7901 Long term (current) use of anticoagulants: Secondary | ICD-10-CM

## 2021-08-06 NOTE — Telephone Encounter (Signed)
Pt is compliant with warfarin management and PCP apts. ?Sent in refill.  ?

## 2021-08-11 ENCOUNTER — Other Ambulatory Visit: Payer: Self-pay | Admitting: Family Medicine

## 2021-08-30 ENCOUNTER — Ambulatory Visit: Payer: Medicare PPO

## 2021-08-30 DIAGNOSIS — Z7901 Long term (current) use of anticoagulants: Secondary | ICD-10-CM | POA: Diagnosis not present

## 2021-08-30 DIAGNOSIS — I4891 Unspecified atrial fibrillation: Secondary | ICD-10-CM

## 2021-08-30 DIAGNOSIS — Z952 Presence of prosthetic heart valve: Secondary | ICD-10-CM

## 2021-08-30 LAB — POCT INR: INR: 2.9 (ref 2.0–3.0)

## 2021-08-30 NOTE — Patient Instructions (Addendum)
Pre visit review using our clinic review tool, if applicable. No additional management support is needed unless otherwise documented below in the visit note.  Continue 4 mg daily except take 2 mg on Fridays. Recheck in 5 weeks with your appointment with Dr. Damita Dunnings.

## 2021-08-30 NOTE — Progress Notes (Addendum)
Continue 4 mg daily except take 2 mg on Fridays. Recheck in 6 weeks.

## 2021-09-11 LAB — FECAL OCCULT BLOOD, IMMUNOCHEMICAL: IFOBT: NEGATIVE

## 2021-10-02 ENCOUNTER — Other Ambulatory Visit: Payer: Self-pay | Admitting: Family Medicine

## 2021-10-04 ENCOUNTER — Encounter: Payer: Self-pay | Admitting: Family Medicine

## 2021-10-04 ENCOUNTER — Ambulatory Visit: Payer: Medicare PPO | Admitting: Family Medicine

## 2021-10-04 ENCOUNTER — Ambulatory Visit (INDEPENDENT_AMBULATORY_CARE_PROVIDER_SITE_OTHER): Payer: Medicare PPO

## 2021-10-04 VITALS — BP 150/78 | HR 58 | Temp 97.4°F | Ht 70.0 in | Wt 241.5 lb

## 2021-10-04 DIAGNOSIS — E119 Type 2 diabetes mellitus without complications: Secondary | ICD-10-CM

## 2021-10-04 DIAGNOSIS — Z7901 Long term (current) use of anticoagulants: Secondary | ICD-10-CM

## 2021-10-04 DIAGNOSIS — R399 Unspecified symptoms and signs involving the genitourinary system: Secondary | ICD-10-CM

## 2021-10-04 DIAGNOSIS — Z23 Encounter for immunization: Secondary | ICD-10-CM | POA: Diagnosis not present

## 2021-10-04 DIAGNOSIS — I4891 Unspecified atrial fibrillation: Secondary | ICD-10-CM

## 2021-10-04 DIAGNOSIS — Z952 Presence of prosthetic heart valve: Secondary | ICD-10-CM | POA: Diagnosis not present

## 2021-10-04 LAB — POCT GLYCOSYLATED HEMOGLOBIN (HGB A1C): Hemoglobin A1C: 7.9 % — AB (ref 4.0–5.6)

## 2021-10-04 LAB — POCT INR: INR: 3.3 — AB (ref 2.0–3.0)

## 2021-10-04 MED ORDER — TAMSULOSIN HCL 0.4 MG PO CAPS: 0.4000 mg | ORAL_CAPSULE | Freq: Every day | ORAL | 3 refills | Status: DC

## 2021-10-04 NOTE — Progress Notes (Signed)
Hold dose today and then continue 4 mg daily except take 2 mg on Fridays. Recheck in 3 weeks.

## 2021-10-04 NOTE — Patient Instructions (Addendum)
Pre visit review using our clinic review tool, if applicable. No additional management support is needed unless otherwise documented below in the visit note.  Hold dose today and then continue 4 mg daily except take 2 mg on Fridays. Recheck in 3 weeks.

## 2021-10-04 NOTE — Progress Notes (Unsigned)
Diabetes:  Using medications without difficulties: yes Hypoglycemic episodes: no known issues now.   Hyperglycemic episodes:no  Feet problems: no Blood Sugars averaging: 150-180.   eye exam within last year: yes A1c 7.9.  he hasn't been exercising as much but he is going to work on that.    He had neg IFOB per patient report, done per patient report.    He hasn't had his BP med yet this AM.  D/w pt about checking BP at home and updating me as needed.    He was asking about trying a different CGM patch.  D/w pt.  Ask pharmacy if he could try dexcom 7- [place].    He has urinary urgency.  He can occ leak urine.  No burning with urination.  Going on a few months.  No abd pain.  No pain with sx.    Meds, vitals, and allergies reviewed.   ROS: Per HPI unless specifically indicated in ROS section   GEN: nad, alert and oriented HEENT: ncat NECK: supple w/o LA CV: rrr. PULM: ctab, no inc wob ABD: soft, +bs EXT: no edema SKIN: no acute rash

## 2021-10-04 NOTE — Patient Instructions (Addendum)
10/04/21 Try adding on flomax and see if the urinary symptoms get better.  Update me as needed.   Recheck labs in about 3-4 months prior to a yearly visit.  Take care.  Glad to see you. Let me see about options on the sugar meter.

## 2021-10-07 DIAGNOSIS — R399 Unspecified symptoms and signs involving the genitourinary system: Secondary | ICD-10-CM | POA: Insufficient documentation

## 2021-10-07 NOTE — Assessment & Plan Note (Addendum)
He is going to work more on diet and exercise and we can recheck his A1c periodically.  Continue glipizide and metformin as is.  Referral placed to check with pharmacy about a separate continuous glucose monitor, to see if he can tolerate a different patch.

## 2021-10-07 NOTE — Assessment & Plan Note (Signed)
Check PSA. He can try adding on flomax and see if the urinary symptoms get better.  He can update me as needed.

## 2021-10-09 ENCOUNTER — Telehealth: Payer: Self-pay | Admitting: Family Medicine

## 2021-10-09 NOTE — Chronic Care Management (AMB) (Signed)
  Chronic Care Management   Note  10/09/2021 Name: [name] [name] MRN: [id number] DOB: 01-21-1951  [name] [name] is a 71 y.o. year old male who is a primary care patient of Tonia Ghent, MD. I reached out to [name] by phone today in response to a referral sent by [name]'s PCP, Tonia Ghent, MD.   [name] was given information about Chronic Care Management services today including:  CCM service includes personalized support from designated clinical staff supervised by his physician, including individualized plan of care and coordination with other care providers 24/7 contact phone numbers for assistance for urgent and routine care needs. Service will only be billed when office clinical staff spend 20 minutes or more in a month to coordinate care. Only one practitioner may furnish and bill the service in a calendar month. The patient may stop CCM services at any time (effective at the end of the month) by phone call to the office staff.   Patient agreed to services and verbal consent obtained.   Follow up plan:REFERRAL/ PT AWARE COPAY   [facility]

## 2021-10-15 ENCOUNTER — Telehealth: Payer: Self-pay

## 2021-10-15 ENCOUNTER — Ambulatory Visit: Payer: Medicare PPO | Admitting: Pharmacist

## 2021-10-15 ENCOUNTER — Telehealth: Payer: Self-pay | Admitting: Family Medicine

## 2021-10-15 DIAGNOSIS — I4891 Unspecified atrial fibrillation: Secondary | ICD-10-CM

## 2021-10-15 DIAGNOSIS — E119 Type 2 diabetes mellitus without complications: Secondary | ICD-10-CM

## 2021-10-15 DIAGNOSIS — E785 Hyperlipidemia, unspecified: Secondary | ICD-10-CM

## 2021-10-15 DIAGNOSIS — I10 Essential (primary) hypertension: Secondary | ICD-10-CM

## 2021-10-15 NOTE — Telephone Encounter (Signed)
Pt stopped by to bring a Handicap [employer]ac[employer] form for Damita Dunnings to complete. Pt requested a call back @ [phone] once forms are completed. Forms are in pcp's folder.

## 2021-10-15 NOTE — Telephone Encounter (Signed)
Placed form in Dr. Josefine Class inbox

## 2021-10-15 NOTE — Chronic Care Management (AMB) (Signed)
Chronic Care Management Pharmacy Assistant   Name: [name] [name]  MRN: [id number] DOB: [birth year]  [name] [name] is an 71 y.o. year old male who presents for his initial CCM visit with the clinical pharmacist.  Reason for Encounter: Initial Questions   Conditions to be addressed/monitored: HTN, HLD, and DMII   Recent office visits:  10/04/21-[name],MD(PCP)- f/u DM-labs(A1c 7.9)He was asking about trying a different CGM patch.  D/w pt. referral made to pharmacy for help.He can try adding on flomax and see if the urinary symptoms get better. f/u 3-4 months with recheck labs  07/02/21-[name],MD(PCP)-f/u DM-UTD DM eye and feet, work on diet,f/u 3 months with labs  Recent consult visits:  05/14/21-[name],MD(derm)-Initial visit-inflamed seborrheic keratosis,cryotherapy-Recommend daily broad spectrum sunscreen SPF 30+ to sun-exposed areas, reapply every 2 hours as needed. Return if symptoms worsen or fail to improve. 04/20/21-[name],MD(endo)- upper endoscopy with dilation procedure,continue daily meds with omeprazole 10m daily.  Hospital visits:  None in previous 6 months  Medications: Outpatient Encounter Medications as of 10/15/2021  Medication Sig   ACCU-CHEK AVIVA PLUS test strip USE AS DIRECTED TO CHECK BLOOD SUGAR 2 TO 4 TIMES A DAY   Accu-Chek FastClix Lancets MISC USE AS DIRECTED TO CHECK BLOOD SUGAR TWICE DAILY   amoxicillin (AMOXIL) 500 MG tablet Take 4 tablets (2,000 mg total) by mouth daily as needed. Before dental visits. Dispense 5 courses   azelastine (ASTELIN) 0.1 % nasal spray Place 2 sprays into both nostrils 2 (two) times daily as needed for rhinitis. Use in each nostril as directed   carvedilol (COREG) 6.25 MG tablet TAKE 1 TABLET BY MOUTH TWICE A DAY. KEEPOFFICE VISIT   cetirizine (ZYRTEC) 10 MG tablet Take 10 mg by mouth daily.   fluocinonide cream (LIDEX) 03.73% APPLY 1 APPLICATION TO AFFECTEDE AREAS 2 TIMES DAILY AS NEEDED    furosemide (LASIX) 20 MG tablet TAKE 1 TABLET BY MOUTH ONCE DAILY   glipiZIDE (GLUCOTROL) 5 MG tablet TAKE 2 TABLETS BY MOUTH DAILY BEFORE BREAKFAST IF AM SUGAR IS ABOVE 150   Lancets Misc. (ACCU-CHEK FASTCLIX LANCET) KIT AS DIRECTED   losartan (COZAAR) 100 MG tablet Take 1 tablet (100 mg total) by mouth daily.   metFORMIN (GLUCOPHAGE) 500 MG tablet TAKE 2 TABLETS BY MOUTH TWICE A DAY   omeprazole (PRILOSEC OTC) 20 MG tablet Take 1 tablet (20 mg total) by mouth daily.   potassium chloride (KLOR-CON) 10 MEQ tablet TAKE 1 TABLET BY MOUTH TWICE A DAY   pravastatin (PRAVACHOL) 20 MG tablet TAKE 1 TABLET BY MOUTH ONCE A DAY   psyllium (METAMUCIL) 58.6 % packet Take 1 packet by mouth daily.   tamsulosin (FLOMAX) 0.4 MG CAPS capsule Take 1 capsule (0.4 mg total) by mouth daily.   warfarin (COUMADIN) 1 MG tablet TAKE 1 TAB BY MOUTH DAILY, EXCEPT TAKE 2TABS ON FRIDAYS OR AS INSTRUCTED BY ANTICOAG CLINIC. (ALSO TAKE THE 3 MG TAB AS INSTRUCTD)   warfarin (COUMADIN) 3 MG tablet TAKE 1 TABLET BY MOUTH ON SUNDAYS, TUESDAYS, WEDNESDAYS, THURSDAYS AND SATURDAYS OR AS DIRECTED BY ANTICOAGULATION CINIC   No facility-administered encounter medications on file as of 10/15/2021.     Lab Results  Component Value Date/Time   HGBA1C 7.9 (A) 10/04/2021 08:33 AM   HGBA1C 7.7 (A) 07/02/2021 08:28 AM   HGBA1C 8.7 (H) 01/01/2021 11:51 AM   HGBA1C 8.3 (H) 03/21/2020 07:55 AM   MICROALBUR 0.2 07/31/2006 08:50 AM     BP Readings from Last 3  Encounters:  10/04/21 (!) 150/78  07/02/21 122/72  04/20/21 120/63    Unsuccessful attempt to reach patient. Left patient message reminding patient of appointment.  Patient did not answer telephone to confirm in office appointment with [name], Pharm D, on 10/15/21 at 1:30.   What is your top health concern you would like to discuss at your upcoming visit? He was asking about trying a different CGM patch.D/w pt. referral made to pharmacy for help.  Have you seen  any other providers since your last visit with PCP? No     CCM referral has been placed prior to visit?  Yes   Star Rating Drugs:  Medication:  Last Fill: Day Supply Glipizide 64m  08/13/21 90 Losartan 1095m8/21/23 90 Metformin 50060m/3/23  90 Pravastatin 37m39m5/23  90   Care Gaps: Annual wellness visit in last year? Yes Most Recent BP reading:150/78  10/04/21  If Diabetic: Most recent A1C reading:7.9 Last eye exam / retinopathy screening:UTD Last diabetic foot exam:UTD  LindCharlene BrookeP notified  [place]AB[phone]

## 2021-10-15 NOTE — Patient Instructions (Signed)
Visit Information  Phone number for Pharmacist: [phone]   Goals Addressed   None     Care Plan : [place]  Updates made by Charlton Haws, RPH since 10/15/2021 12:00 AM     Problem: Hypertension, Hyperlipidemia, Diabetes, and Atrial Fibrillation   Priority: High     Long-Range Goal: Disease mgmt   Start Date: 10/15/2021  Expected End Date: 01/14/2022  This Visit's Progress: On track  Priority: High  Note:   Current Barriers:  Unable to independently monitor therapeutic efficacy  Pharmacist Clinical Goal(s):  Patient will achieve adherence to monitoring guidelines and medication adherence to achieve therapeutic efficacy through collaboration with PharmD and provider.   Interventions: 1:1 collaboration with Tonia Ghent, MD regarding development and update of comprehensive plan of care as evidenced by provider attestation and co-signature Inter-disciplinary care team collaboration (see longitudinal plan of care) Comprehensive medication review performed; medication list updated in electronic medical record  Hypertension (BP goal <130/80) -Controlled -Current home readings: n/a -Hx AVR, cardiomyopathy, OSA -Current treatment: Carvedilol 6.25 mg BID - Appropriate, Effective, Safe, Accessible Furosemide 20 mg daily -Appropriate, Effective, Safe, Accessible Losartan 100 mg daily -Appropriate, Effective, Safe, Accessible -Medications previously tried: n/a  -Recommended to continue current medication  Hyperlipidemia: (LDL goal < 100) -Controlled - LDL 82 (12/2020) at goal -Current treatment: Pravastatin 20 mg daily - Appropriate, Effective, Safe, Accessible -Medications previously tried: n/a  -Educated on Cholesterol goals;  -Recommended to continue current medication  Diabetes (A1c goal <7%) -Not ideally controlled - A1c 7.9% (09/2021) above goal;  -Pt previously was on Dexcom G6 but had rash with the adhesive -Current home glucose readings:  140-200s -Denies hypoglycemic/hyperglycemic symptoms -Current medications: Glipizide 5 mg - 2 tab daily - Appropriate, Query Effective Metformin 500 mg - 2 tab BID -Appropriate, Query Effective -Medications previously tried: Lantus, Dexcom G6 ([facility] Med 2021) -Educated on A1c and blood sugar goals; -Discussed Continuous glucose monitoring - Medicare will only cover this for patients on insulin or hx of severe hypoglcyemia, so patient does not qualify -Provided patient with Libre 3 sample and instructions for downloading the app on his phone, advised 2-week trial of device to get an idea of his glucose patterns -Recommended to continue current medication  Atrial Fibrillation (Goal: prevent stroke and major bleeding) -Controlled -CHADSVASC: 3 -Home BP and HR readings: n/a  -Current treatment: Carvedilol 6.25 mg BID - Appropriate, Effective, Safe, Accessible Warfarin 1-3 mg AD - Appropriate, Effective, Safe, Accessible -Medications previously tried: n/a -Counseled on increased risk of stroke due to Afib and benefits of anticoagulation for stroke prevention; -Recommended to continue current medication  Patient Goals/Self-Care Activities Patient will:  - take medications as prescribed as evidenced by patient report and record review focus on medication adherence by routine check glucose using CGM, document, and provide at future appointments      Patient verbalizes understanding of instructions and care plan provided today and agrees to view in [place]. Active MyChart status and patient understanding of how to access instructions and care plan via MyChart confirmed with patient.    Telephone follow up appointment with pharmacy team member scheduled for: 2 weeks  [name], PharmD, [facility] Clinical Pharmacist [facility] [phone]

## 2021-10-15 NOTE — Progress Notes (Signed)
Chronic Care Management Pharmacy Note  10/15/2021 Name:  [name] [name] MRN:  [id number] DOB:  09/10/1950  Summary: Initial visit -Reviewed CGM options: pt was on Dexcom in 2021 and had possible skin reaction to adhesive, he is interested in trial of Freestyle Libre now; unfortunately [employer] will not cover CGM unless he is on insulin or has hx of severe hypoglycemia, so he will not qualify for CGM through insurance  Recommendations/Changes made from today's visit: -Supplied patient with [place] 3 sample and instructions for setting up his phone with [place] 3 app. Advised 2-week trial of Libre to help identify glucose patterns  Plan: -Pharmacist follow up televisit scheduled for 2 weeks    Subjective: [name] [name] is an 71 y.o. year old male who is a primary patient of Para March, Dwana Curd, MD.  The CCM team was consulted for assistance with disease management and care coordination needs.    Engaged with patient face to face for initial visit in response to provider referral for pharmacy case management and/or care coordination services.   Consent to Services:  The patient was given the following information about Chronic Care Management services today, agreed to services, and gave verbal consent: 1. CCM service includes personalized support from designated clinical staff supervised by the primary care provider, including individualized plan of care and coordination with other care providers 2. 24/7 contact phone numbers for assistance for urgent and routine care needs. 3. Service will only be billed when office clinical staff spend 20 minutes or more in a month to coordinate care. 4. Only one practitioner may furnish and bill the service in a calendar month. 5.The patient may stop CCM services at any time (effective at the end of the month) by phone call to the office staff. 6. The patient will be responsible for cost sharing (co-pay) of up to 20% of the service fee (after annual  deductible is met). Patient agreed to services and consent obtained.  Patient Care Team: Joaquim Nam, MD as PCP - General (Family Medicine) Rollene Rotunda, MD as PCP - Cardiology (Cardiology) Harriette Bouillon, MD as Consulting Physician (General Surgery) Blair Promise, OD (Optometry) Kathyrn Sheriff, [facility] as Pharmacist (Pharmacist)  Recent office visits: 10/04/21 Dr Para March OV: f/u - referred to pharm for different CGM (allergy to [facility] adhesive). Rx flomax for urinary sx.   Recent consult visits: 03/28/21 PA Quentin Mulling (GI): endoscopy planning  01/16/21 Dr Antoine Poche (Cardiology): s/p AVR. No med changes  Hospital visits: None in previous 6 months   Objective:  Lab Results  Component Value Date   CREATININE 0.90 03/10/2021   BUN 19 03/10/2021   GFR 81.03 01/01/2021   GFRNONAA >60 03/10/2021   GFRAA >60 01/23/2016   NA 139 03/10/2021   K 4.1 03/10/2021   CALCIUM 9.6 03/10/2021   CO2 23 03/10/2021   GLUCOSE 108 (H) 03/10/2021    Lab Results  Component Value Date/Time   HGBA1C 7.9 (A) 10/04/2021 08:33 AM   HGBA1C 7.7 (A) 07/02/2021 08:28 AM   HGBA1C 8.7 (H) 01/01/2021 11:51 AM   HGBA1C 8.3 (H) 03/21/2020 07:55 AM   GFR 81.03 01/01/2021 11:51 AM   GFR 70.74 03/21/2020 07:55 AM   MICROALBUR 0.2 07/31/2006 08:50 AM    Last diabetic Eye exam:  Lab Results  Component Value Date/Time   HMDIABEYEEXA No Retinopathy 06/05/2021 12:20 PM    Last diabetic Foot exam: No results found for: "HMDIABFOOTEX"   Lab Results  Component Value Date   CHOL  152 01/01/2021   HDL 48.60 01/01/2021   LDLCALC 82 01/01/2021   LDLDIRECT 97.0 12/21/2019   TRIG 103.0 01/01/2021   CHOLHDL 3 01/01/2021       Latest Ref Rng & Units 01/01/2021   11:51 AM 12/21/2019    3:17 PM 12/17/2018    8:36 AM  Hepatic Function  Total Protein 6.0 - 8.3 g/dL 7.0  7.4  7.3   Albumin 3.5 - 5.2 g/dL 4.5  4.5  4.5   AST 0 - 37 U/L 28  31  36   ALT 0 - 53 U/L 28  26  33   Alk Phosphatase 39  - 117 U/L 60  64  66   Total Bilirubin 0.2 - 1.2 mg/dL 1.1  0.8  0.9     Lab Results  Component Value Date/Time   TSH 1.64 01/01/2021 11:51 AM   TSH 2.05 12/21/2019 03:17 PM       Latest Ref Rng & Units 03/10/2021    5:40 PM 01/01/2021   11:51 AM 12/21/2019    3:17 PM  CBC  WBC 4.0 - 10.5 K/uL 6.8  5.1  5.6   Hemoglobin 13.0 - 17.0 g/dL 15.6  14.9  14.8   Hematocrit 39.0 - 52.0 % 44.7  43.4  42.6   Platelets 150 - 400 K/uL 160  146.0  161.0    CHA2DS2/VAS Stroke Risk Points  Current as of about an hour ago     3 >= 2 Points: High Risk  1 - 1.99 Points: Medium Risk  0 Points: Low Risk    Last Change: N/A     Points Metrics  0 Has Congestive Heart Failure:  No    Current as of about an hour ago  0 Has Vascular Disease:  No    Current as of about an hour ago  1 Has Hypertension:  Yes    Current as of about an hour ago  1 Age:  58    Current as of about an hour ago  1 Has Diabetes:  Yes    Current as of about an hour ago  0 Had Stroke:  No  Had TIA:  No  Had Thromboembolism:  No    Current as of about an hour ago  0 Male:  No    Current as of about an hour ago   No results found for: "VD25OH"  Clinical ASCVD: No  The 10-year ASCVD risk score (Arnett DK, et al., 2019) is: 38.3%   Values used to calculate the score:     Age: 33 years     Sex: Male     Is Non-Hispanic African American: No     Diabetic: Yes     Tobacco smoker: No     Systolic Blood Pressure: 106 mmHg     Is BP treated: Yes     HDL Cholesterol: 48.6 mg/dL     Total Cholesterol: 152 mg/dL       01/01/2021   11:05 AM 12/27/2019    9:00 AM 12/16/2018    3:47 PM  Depression screen PHQ 2/9  Decreased Interest 0 0 0  Down, Depressed, Hopeless 0 0 0  PHQ - 2 Score 0 0 0  Altered sleeping   0  Tired, decreased energy   0  Change in appetite   0  Feeling bad or failure about yourself    0  Trouble concentrating   0  Moving slowly or fidgety/restless   0  Suicidal thoughts   0  PHQ-9 Score   0   Difficult doing work/chores   Not difficult at all     Social History   Tobacco Use  Smoking Status Never  Smokeless Tobacco Never   BP Readings from Last 3 Encounters:  10/04/21 (!) 150/78  07/02/21 122/72  04/20/21 120/63   Pulse Readings from Last 3 Encounters:  10/04/21 (!) 58  07/02/21 (!) 57  04/20/21 (!) 55   Wt Readings from Last 3 Encounters:  10/04/21 241 lb 8 oz (109.5 kg)  07/02/21 237 lb (107.5 kg)  04/20/21 235 lb (106.6 kg)   BMI Readings from Last 3 Encounters:  10/04/21 34.65 kg/m  07/02/21 34.01 kg/m  04/20/21 33.72 kg/m    Assessment/Interventions: Review of patient past medical history, allergies, medications, health status, including review of consultants reports, laboratory and other test data, was performed as part of comprehensive evaluation and provision of chronic care management services.   SDOH:  (Social Determinants of Health) assessments and interventions performed: Yes SDOH Interventions    Flowsheet Row Chronic Care Management from 10/15/2021 in [facility] Clinical Support from 12/16/2018 in [facility] [place]  SDOH Interventions    Transportation Interventions Intervention Not Indicated --  Depression Interventions/Treatment  -- WYO3-7 Score <4 Follow-up Not Indicated  Financial Strain Interventions Intervention Not Indicated --      SDOH Screenings   Food Insecurity: No Food Insecurity (12/16/2018)  Transportation Needs: No Transportation Needs (10/15/2021)  Depression (PHQ2-9): Low Risk  (01/01/2021)  Financial Resource Strain: Low Risk  (10/15/2021)  Physical Activity: Inactive (12/16/2018)  Stress: No Stress Concern Present (12/16/2018)  Tobacco Use: Low Risk  (10/04/2021)    CCM Care Plan  Allergies  Allergen Reactions   Ace Inhibitors     REACTION: cough   Aspirin Other (See Comments)    Gastritis.    Other     dexcom patch- local rash- presumed adhesive allergy.     Primaxin  [Imipenem W/Cilastatin Sodium]     Rash- but able to take amoxil w/o troubles.    Tape     Rash from bandaids.      Medications Reviewed Today     Reviewed by Kathyrn Sheriff, [facility] (Pharmacist) on 10/15/21 at 1417  Med List Status: <None>   Medication Order Taking? Sig Documenting Provider Last Dose Status Informant  ACCU-CHEK AVIVA PLUS test strip [id number] Yes USE AS DIRECTED TO CHECK BLOOD SUGAR 2 TO 4 TIMES A DAY Joaquim Nam, MD Taking Active   Accu-Chek FastClix Lancets MISC [id number] Yes USE AS DIRECTED TO CHECK BLOOD SUGAR TWICE DAILY Joaquim Nam, MD Taking Active   amoxicillin (AMOXIL) 500 MG tablet [id number] Yes Take 4 tablets (2,000 mg total) by mouth daily as needed. Before dental visits. Dispense 5 courses Joaquim Nam, MD Taking Active   azelastine (ASTELIN) 0.1 % nasal spray [id number] Yes Place 2 sprays into both nostrils 2 (two) times daily as needed for rhinitis. Use in each nostril as directed Joaquim Nam, MD Taking Active   carvedilol (COREG) 6.25 MG tablet [id number] Yes TAKE 1 TABLET BY MOUTH TWICE A DAY. KEEPOFFICE VISIT Rollene Rotunda, MD Taking Active   cetirizine (ZYRTEC) 10 MG tablet [id number] Yes Take 10 mg by mouth daily. [provider] Taking Active   fluocinonide cream (LIDEX) 0.05 % [id number] Yes APPLY 1 APPLICATION TO AFFECTEDE AREAS 2 TIMES DAILY AS NEEDED Joaquim Nam, MD Taking Active  furosemide (LASIX) 20 MG tablet [id number] Yes TAKE 1 TABLET BY MOUTH ONCE DAILY Tonia Ghent, MD Taking Active   glipiZIDE (GLUCOTROL) 5 MG tablet [id number] Yes TAKE 2 TABLETS BY MOUTH DAILY BEFORE BREAKFAST IF AM SUGAR IS ABOVE 150 Tonia Ghent, MD Taking Active   Lancets Misc. (ACCU-CHEK FASTCLIX LANCET) KIT [id number] Yes AS DIRECTED Tonia Ghent, MD Taking Active   losartan (COZAAR) 100 MG tablet [id number] Yes Take 1 tablet (100 mg total) by mouth daily. Minus Breeding, MD Taking Active   metFORMIN (GLUCOPHAGE) 500 MG tablet  [id number] Yes TAKE 2 TABLETS BY MOUTH TWICE A DAY Tonia Ghent, MD Taking Active   omeprazole (PRILOSEC OTC) 20 MG tablet [id number] Yes Take 1 tablet (20 mg total) by mouth daily. Daryel November, MD Taking Active   potassium chloride (KLOR-CON) 10 MEQ tablet [id number] Yes TAKE 1 TABLET BY MOUTH TWICE A DAY Tonia Ghent, MD Taking Active   pravastatin (PRAVACHOL) 20 MG tablet [id number] Yes TAKE 1 TABLET BY MOUTH ONCE A DAY Tonia Ghent, MD Taking Active   psyllium (METAMUCIL) 58.6 % packet [id number] Yes Take 1 packet by mouth daily. [provider] Taking Active   tamsulosin (FLOMAX) 0.4 MG CAPS capsule [id number] Yes Take 1 capsule (0.4 mg total) by mouth daily. Tonia Ghent, MD Taking Active   warfarin (COUMADIN) 1 MG tablet [id number] Yes TAKE 1 TAB BY MOUTH DAILY, EXCEPT TAKE 2TABS ON FRIDAYS OR AS INSTRUCTED BY ANTICOAG CLINIC. (ALSO TAKE THE 3 MG TAB AS INSTRUCTD) Tonia Ghent, MD Taking Active   warfarin (COUMADIN) 3 MG tablet [id number] Yes TAKE 1 TABLET BY MOUTH ON SUNDAYS, TUESDAYS, WEDNESDAYS, THURSDAYS AND SATURDAYS OR AS DIRECTED BY ANTICOAGULATION CINIC Tonia Ghent, MD Taking Active             Patient Active Problem List   Diagnosis Date Noted   Lower urinary tract symptoms (LUTS) 10/07/2021   Trochanteric bursitis 07/04/2021   Dilated cardiomyopathy ([place]) 08/30/2019   HLD (hyperlipidemia) 12/23/2018   Long term (current) use of anticoagulants 01/23/2017   Health care maintenance 12/04/2016   Food impaction of esophagus    Esophageal stenosis    S/P AVR 06/20/2014   Advance care planning 05/12/2014   Rash 05/12/2014   Medicare annual wellness visit, subsequent 04/02/2013   Encounter for therapeutic drug monitoring 04/01/2013   Diabetes mellitus, type II ([place]) 11/05/2011   History of pancreatitis 08/27/2011   Anemia 01/01/2011   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   Shoulder pain, right 06/07/2010   Aortic valve replaced 02/15/2010    OBSTRUCTIVE SLEEP APNEA 10/05/2009   PALPITATIONS 09/19/2009   Atrial fibrillation ([place]) 08/15/2009   NEOP, UB, SKIN 08/08/2006   HYPERCHOLESTEROLEMIA, PURE 07/31/2006   OBESITY 07/17/2006   Essential hypertension 07/17/2006   GERD 07/17/2006    Immunization History  Administered Date(s) Administered   Fluad Quad(high Dose 65+) 11/19/2018, 12/27/2019, 09/29/2020, 10/04/2021   Influenza Split 10/25/2010, 11/28/2011   Influenza Whole 10/28/2009   Influenza,inj,Quad PF,6+ Mos 11/23/2012, 11/01/2013, 11/16/2014, 12/01/2015, 12/03/2016, 12/10/2017   PFIZER(Purple Top)SARS-COV-2 Vaccination 04/02/2019, 04/23/2019, 02/08/2020   Pneumococcal Conjugate-13 06/02/2017   Pneumococcal Polysaccharide-23 01/28/2009, 12/22/2018   Td 11/21/1997   Tdap 04/01/2013   Zoster Recombinat (Shingrix) 01/04/2019, 02/22/2019   Zoster, Live 05/16/2014    Conditions to be addressed/monitored:  Hypertension, Hyperlipidemia, Diabetes, and Atrial Fibrillation  Care Plan : [place]  Updates made by Charlton Haws, [place] since 10/15/2021 12:00 AM  Problem: Hypertension, Hyperlipidemia, Diabetes, and Atrial Fibrillation   Priority: High     Long-Range Goal: Disease mgmt   Start Date: 10/15/2021  Expected End Date: 01/14/2022  This Visit's Progress: On track  Priority: High  Note:   Current Barriers:  Unable to independently monitor therapeutic efficacy  Pharmacist Clinical Goal(s):  Patient will achieve adherence to monitoring guidelines and medication adherence to achieve therapeutic efficacy through collaboration with PharmD and provider.   Interventions: 1:1 collaboration with Tonia Ghent, MD regarding development and update of comprehensive plan of care as evidenced by provider attestation and co-signature Inter-disciplinary care team collaboration (see longitudinal plan of care) Comprehensive medication review performed; medication list updated in electronic medical  record  Hypertension (BP goal <130/80) -Controlled -Current home readings: n/a -Hx AVR, cardiomyopathy, OSA -Current treatment: Carvedilol 6.25 mg BID - Appropriate, Effective, Safe, Accessible Furosemide 20 mg daily -Appropriate, Effective, Safe, Accessible Losartan 100 mg daily -Appropriate, Effective, Safe, Accessible -Medications previously tried: n/a  -Recommended to continue current medication  Hyperlipidemia: (LDL goal < 100) -Controlled - LDL 82 (12/2020) at goal -Current treatment: Pravastatin 20 mg daily - Appropriate, Effective, Safe, Accessible -Medications previously tried: n/a  -Educated on Cholesterol goals;  -Recommended to continue current medication  Diabetes (A1c goal <7%) -Not ideally controlled - A1c 7.9% (09/2021) above goal;  -Pt previously was on Dexcom G6 but had rash with the adhesive -Current home glucose readings: 140-200s -Denies hypoglycemic/hyperglycemic symptoms -Current medications: Glipizide 5 mg - 2 tab daily - Appropriate, Query Effective Metformin 500 mg - 2 tab BID -Appropriate, Query Effective -Medications previously tried: Lantus, Dexcom G6 ([facility] Med 2021) -Educated on A1c and blood sugar goals; -Discussed Continuous glucose monitoring - Medicare will only cover this for patients on insulin or hx of severe hypoglcyemia, so patient does not qualify -Provided patient with Libre 3 sample and instructions for downloading the app on his phone, advised 2-week trial of device to get an idea of his glucose patterns -Recommended to continue current medication  Atrial Fibrillation (Goal: prevent stroke and major bleeding) -Controlled -CHADSVASC: 3 -Home BP and HR readings: n/a  -Current treatment: Carvedilol 6.25 mg BID - Appropriate, Effective, Safe, Accessible Warfarin 1-3 mg AD - Appropriate, Effective, Safe, Accessible -Medications previously tried: n/a -Counseled on increased risk of stroke due to Afib and benefits of anticoagulation for  stroke prevention; -Recommended to continue current medication  Patient Goals/Self-Care Activities Patient will:  - take medications as prescribed as evidenced by patient report and record review focus on medication adherence by routine check glucose using CGM, document, and provide at future appointments      Medication Assistance: None required.  Patient affirms current coverage meets needs.  Compliance/Adherence/Medication fill history: Care Gaps: Kidney eval - due Colonoscopy (due 07/10/21)  Star-Rating Drugs: Glipizide - PDC 84% (LF 08/13/21) Metformin - PDC 92% Losartan - PDC 99% Pravastatin - PDC 94%  Medication Access: Within the past 30 days, how often has patient missed a dose of medication? 0 Is a pillbox or other method used to improve adherence? Yes  Factors that may affect medication adherence? no barriers identified Are meds synced by current pharmacy? No  Are meds delivered by current pharmacy? No  Does patient experience delays in picking up medications due to transportation concerns? No   [facility] Services Reviewed: Is patient disadvantaged to use [facility]?: No  Current Rx insurance plan: [employer] Name and location of Current pharmacy:  [place], [address]  [address] [address] Phone: [phone] Fax: [phone]  [facility] services reviewed with patient today?: No  Patient requests to transfer care to [facility]?: No  Reason patient declined to change pharmacies: Loyalty to other pharmacy/Patient preference   Care Plan and Follow Up Patient Decision:  Patient agrees to Care Plan and Follow-up.  Plan: Telephone follow up appointment with care management team member scheduled for:  2 weeks  [name], PharmD, [facility] Clinical Pharmacist [facility] at [facility] [phone]

## 2021-10-23 NOTE — Telephone Encounter (Signed)
Form is ready for pick up and place up front. Left message on patients VM that form is ready.

## 2021-10-24 ENCOUNTER — Telehealth: Payer: Self-pay

## 2021-10-24 NOTE — Progress Notes (Signed)
    Chronic Care Management Pharmacy Assistant   Name: [name] [name]  MRN: [id number] DOB: 1950-09-27  Reason for Encounter: CCM (Appointment Reminder)  Medications: Outpatient Encounter Medications as of 10/24/2021  Medication Sig   ACCU-CHEK AVIVA PLUS test strip USE AS DIRECTED TO CHECK BLOOD SUGAR 2 TO 4 TIMES A DAY   Accu-Chek FastClix Lancets MISC USE AS DIRECTED TO CHECK BLOOD SUGAR TWICE DAILY   amoxicillin (AMOXIL) 500 MG tablet Take 4 tablets (2,000 mg total) by mouth daily as needed. Before dental visits. Dispense 5 courses   azelastine (ASTELIN) 0.1 % nasal spray Place 2 sprays into both nostrils 2 (two) times daily as needed for rhinitis. Use in each nostril as directed   carvedilol (COREG) 6.25 MG tablet TAKE 1 TABLET BY MOUTH TWICE A DAY. KEEPOFFICE VISIT   cetirizine (ZYRTEC) 10 MG tablet Take 10 mg by mouth daily.   fluocinonide cream (LIDEX) 8.00 % APPLY 1 APPLICATION TO AFFECTEDE AREAS 2 TIMES DAILY AS NEEDED   furosemide (LASIX) 20 MG tablet TAKE 1 TABLET BY MOUTH ONCE DAILY   glipiZIDE (GLUCOTROL) 5 MG tablet TAKE 2 TABLETS BY MOUTH DAILY BEFORE BREAKFAST IF AM SUGAR IS ABOVE 150   Lancets Misc. (ACCU-CHEK FASTCLIX LANCET) KIT AS DIRECTED   losartan (COZAAR) 100 MG tablet Take 1 tablet (100 mg total) by mouth daily.   metFORMIN (GLUCOPHAGE) 500 MG tablet TAKE 2 TABLETS BY MOUTH TWICE A DAY   omeprazole (PRILOSEC OTC) 20 MG tablet Take 1 tablet (20 mg total) by mouth daily.   potassium chloride (KLOR-CON) 10 MEQ tablet TAKE 1 TABLET BY MOUTH TWICE A DAY   pravastatin (PRAVACHOL) 20 MG tablet TAKE 1 TABLET BY MOUTH ONCE A DAY   psyllium (METAMUCIL) 58.6 % packet Take 1 packet by mouth daily.   tamsulosin (FLOMAX) 0.4 MG CAPS capsule Take 1 capsule (0.4 mg total) by mouth daily.   warfarin (COUMADIN) 1 MG tablet TAKE 1 TAB BY MOUTH DAILY, EXCEPT TAKE 2TABS ON FRIDAYS OR AS INSTRUCTED BY ANTICOAG CLINIC. (ALSO TAKE THE 3 MG TAB AS INSTRUCTD)   warfarin (COUMADIN) 3 MG  tablet TAKE 1 TABLET BY MOUTH ON SUNDAYS, TUESDAYS, WEDNESDAYS, THURSDAYS AND SATURDAYS OR AS DIRECTED BY ANTICOAGULATION CINIC   No facility-administered encounter medications on file as of 10/24/2021.   [name] was contacted to remind of upcoming telephone visit with [name] on 10/31/2021 at 1:30. Patient was reminded to have any blood glucose and blood pressure readings available for review at appointment.   Message was left reminding patient of appointment.  CCM referral has been placed prior to visit?  Yes   Star Rating Drugs: Medication:  Last Fill: Day Supply Glipizide 5 mg  08/13/2021 90 Losartan 100 mg 09/17/2021 90 Metformin 500 mg 07/30/2021 90 Pravastatin 20 mg 10/02/2021 [place], CPP notified  Marijean Niemann, [place] Pharmacy Assistant [phone]

## 2021-10-25 ENCOUNTER — Ambulatory Visit: Payer: Medicare PPO

## 2021-10-25 DIAGNOSIS — Z952 Presence of prosthetic heart valve: Secondary | ICD-10-CM

## 2021-10-25 DIAGNOSIS — Z7901 Long term (current) use of anticoagulants: Secondary | ICD-10-CM

## 2021-10-25 DIAGNOSIS — I4891 Unspecified atrial fibrillation: Secondary | ICD-10-CM

## 2021-10-25 LAB — POCT INR: INR: 3 (ref 2.0–3.0)

## 2021-10-25 NOTE — Patient Instructions (Signed)
Continue 4 mg daily except take 2 mg on Fridays. Recheck in 4 weeks.

## 2021-10-25 NOTE — Progress Notes (Signed)
Continue 4 mg daily except take 2 mg on Fridays. Recheck in 4 weeks.

## 2021-10-29 ENCOUNTER — Other Ambulatory Visit: Payer: Self-pay

## 2021-10-29 ENCOUNTER — Other Ambulatory Visit: Payer: Self-pay | Admitting: Family Medicine

## 2021-10-29 DIAGNOSIS — Z7901 Long term (current) use of anticoagulants: Secondary | ICD-10-CM

## 2021-10-29 MED ORDER — WARFARIN SODIUM 3 MG PO TABS: ORAL_TABLET | ORAL | 1 refills | Status: DC

## 2021-10-31 ENCOUNTER — Telehealth: Payer: Self-pay | Admitting: Pharmacist

## 2021-10-31 ENCOUNTER — Telehealth: Payer: Medicare PPO

## 2021-10-31 NOTE — Telephone Encounter (Signed)
Spoke with patient regarding [employer] sample he was given 2 weeks ago- he reports he was not able to apply the sensor due to injecting it incorrectly by mistake. Offered to get him another sample to try, but pt declined.  Also looked into chronic coverage of CGM, his insurance plan will not cover CGM because he is not on insulin. If Medicare coverage changes in the future, he may be able to get CGM but right now it will not be affordable.  Pt will continue to monitor sugars using regular glucometer. No further action needed.

## 2021-10-31 NOTE — Progress Notes (Deleted)
Chronic Care Management Pharmacy Note  10/31/2021 Name:  [name] [name] MRN:  [id number] DOB:  [birth year]  Summary: Initial visit -Reviewed CGM options: pt was on Dexcom in 2021 and had possible skin reaction to adhesive, he is interested in trial of Freestyle Libre now; unfortunately [employer] will not cover CGM unless he is on insulin or has hx of severe hypoglycemia, so he will not qualify for CGM through insurance  Recommendations/Changes made from today's visit: -Supplied patient with [place] 3 sample and instructions for setting up his phone with [place] 3 app. Advised 2-week trial of Libre to help identify glucose patterns  Plan: -Pharmacist follow up televisit scheduled for 2 weeks    Subjective: [name] [name] is an 71 y.o. year old male who is a primary patient of Para March, Dwana Curd, MD.  The CCM team was consulted for assistance with disease management and care coordination needs.    Engaged with patient face to face for initial visit in response to provider referral for pharmacy case management and/or care coordination services.   Consent to Services:  The patient was given the following information about Chronic Care Management services today, agreed to services, and gave verbal consent: 1. CCM service includes personalized support from designated clinical staff supervised by the primary care provider, including individualized plan of care and coordination with other care providers 2. 24/7 contact phone numbers for assistance for urgent and routine care needs. 3. Service will only be billed when office clinical staff spend 20 minutes or more in a month to coordinate care. 4. Only one practitioner may furnish and bill the service in a calendar month. 5.The patient may stop CCM services at any time (effective at the end of the month) by phone call to the office staff. 6. The patient will be responsible for cost sharing (co-pay) of up to 20% of the service fee (after annual  deductible is met). Patient agreed to services and consent obtained.  Patient Care Team: Joaquim Nam, MD as PCP - General (Family Medicine) Rollene Rotunda, MD as PCP - Cardiology (Cardiology) Harriette Bouillon, MD as Consulting Physician (General Surgery) Blair Promise, OD (Optometry) Kathyrn Sheriff, [facility] as Pharmacist (Pharmacist)  Recent office visits: 10/04/21 Dr Para March OV: f/u - referred to pharm for different CGM (allergy to [facility] adhesive). Rx flomax for urinary sx.   Recent consult visits: 03/28/21 PA Quentin Mulling (GI): endoscopy planning  01/16/21 Dr Antoine Poche (Cardiology): s/p AVR. No med changes  Hospital visits: None in previous 6 months   Objective:  Lab Results  Component Value Date   CREATININE 0.90 03/10/2021   BUN 19 03/10/2021   GFR 81.03 01/01/2021   GFRNONAA >60 03/10/2021   GFRAA >60 01/23/2016   NA 139 03/10/2021   K 4.1 03/10/2021   CALCIUM 9.6 03/10/2021   CO2 23 03/10/2021   GLUCOSE 108 (H) 03/10/2021    Lab Results  Component Value Date/Time   HGBA1C 7.9 (A) 10/04/2021 08:33 AM   HGBA1C 7.7 (A) 07/02/2021 08:28 AM   HGBA1C 8.7 (H) 01/01/2021 11:51 AM   HGBA1C 8.3 (H) 03/21/2020 07:55 AM   GFR 81.03 01/01/2021 11:51 AM   GFR 70.74 03/21/2020 07:55 AM   MICROALBUR 0.2 07/31/2006 08:50 AM    Last diabetic Eye exam:  Lab Results  Component Value Date/Time   HMDIABEYEEXA No Retinopathy 06/05/2021 12:20 PM    Last diabetic Foot exam: No results found for: "HMDIABFOOTEX"   Lab Results  Component Value Date   CHOL  152 01/01/2021   HDL 48.60 01/01/2021   LDLCALC 82 01/01/2021   LDLDIRECT 97.0 12/21/2019   TRIG 103.0 01/01/2021   CHOLHDL 3 01/01/2021       Latest Ref Rng & Units 01/01/2021   11:51 AM 12/21/2019    3:17 PM 12/17/2018    8:36 AM  Hepatic Function  Total Protein 6.0 - 8.3 g/dL 7.0  7.4  7.3   Albumin 3.5 - 5.2 g/dL 4.5  4.5  4.5   AST 0 - 37 U/L 28  31  36   ALT 0 - 53 U/L 28  26  33   Alk Phosphatase 39  - 117 U/L 60  64  66   Total Bilirubin 0.2 - 1.2 mg/dL 1.1  0.8  0.9     Lab Results  Component Value Date/Time   TSH 1.64 01/01/2021 11:51 AM   TSH 2.05 12/21/2019 03:17 PM       Latest Ref Rng & Units 03/10/2021    5:40 PM 01/01/2021   11:51 AM 12/21/2019    3:17 PM  CBC  WBC 4.0 - 10.5 K/uL 6.8  5.1  5.6   Hemoglobin 13.0 - 17.0 g/dL 15.6  14.9  14.8   Hematocrit 39.0 - 52.0 % 44.7  43.4  42.6   Platelets 150 - 400 K/uL 160  146.0  161.0    CHA2DS2/VAS Stroke Risk Points  Current as of about an hour ago     3 >= 2 Points: High Risk  1 - 1.99 Points: Medium Risk  0 Points: Low Risk    Last Change: N/A     Points Metrics  0 Has Congestive Heart Failure:  No    Current as of about an hour ago  0 Has Vascular Disease:  No    Current as of about an hour ago  1 Has Hypertension:  Yes    Current as of about an hour ago  1 Age:  71    Current as of about an hour ago  1 Has Diabetes:  Yes    Current as of about an hour ago  0 Had Stroke:  No  Had TIA:  No  Had Thromboembolism:  No    Current as of about an hour ago  0 Male:  No    Current as of about an hour ago   No results found for: "VD25OH"  Clinical ASCVD: No  The 10-year ASCVD risk score (Arnett DK, et al., 2019) is: 38.3%   Values used to calculate the score:     Age: 61 years     Sex: Male     Is Non-Hispanic African American: No     Diabetic: Yes     Tobacco smoker: No     Systolic Blood Pressure: 929 mmHg     Is BP treated: Yes     HDL Cholesterol: 48.6 mg/dL     Total Cholesterol: 152 mg/dL       01/01/2021   11:05 AM 12/27/2019    9:00 AM 12/16/2018    3:47 PM  Depression screen PHQ 2/9  Decreased Interest 0 0 0  Down, Depressed, Hopeless 0 0 0  PHQ - 2 Score 0 0 0  Altered sleeping   0  Tired, decreased energy   0  Change in appetite   0  Feeling bad or failure about yourself    0  Trouble concentrating   0  Moving slowly or fidgety/restless   0  Suicidal thoughts   0  PHQ-9 Score   0   Difficult doing work/chores   Not difficult at all     Social History   Tobacco Use  Smoking Status Never  Smokeless Tobacco Never   BP Readings from Last 3 Encounters:  10/04/21 (!) 150/78  07/02/21 122/72  04/20/21 120/63   Pulse Readings from Last 3 Encounters:  10/04/21 (!) 58  07/02/21 (!) 57  04/20/21 (!) 55   Wt Readings from Last 3 Encounters:  10/04/21 241 lb 8 oz (109.5 kg)  07/02/21 237 lb (107.5 kg)  04/20/21 235 lb (106.6 kg)   BMI Readings from Last 3 Encounters:  10/04/21 34.65 kg/m  07/02/21 34.01 kg/m  04/20/21 33.72 kg/m    Assessment/Interventions: Review of patient past medical history, allergies, medications, health status, including review of consultants reports, laboratory and other test data, was performed as part of comprehensive evaluation and provision of chronic care management services.   SDOH:  (Social Determinants of Health) assessments and interventions performed: Yes SDOH Interventions    Flowsheet Row Chronic Care Management from 10/15/2021 in [facility] Clinical Support from 12/16/2018 in [facility]  SDOH Interventions    Transportation Interventions Intervention Not Indicated --  Depression Interventions/Treatment  -- QWY3-6 Score <4 Follow-up Not Indicated  Financial Strain Interventions Intervention Not Indicated --      SDOH Screenings   Food Insecurity: No Food Insecurity (12/16/2018)  Transportation Needs: No Transportation Needs (10/15/2021)  Depression (PHQ2-9): Low Risk  (01/01/2021)  Financial Resource Strain: Low Risk  (10/15/2021)  Physical Activity: Inactive (12/16/2018)  Stress: No Stress Concern Present (12/16/2018)  Tobacco Use: Low Risk  (10/04/2021)    CCM Care Plan  Allergies  Allergen Reactions   Ace Inhibitors     REACTION: cough   Aspirin Other (See Comments)    Gastritis.    Other     dexcom patch- local rash- presumed adhesive allergy.     Primaxin  [Imipenem W/Cilastatin Sodium]     Rash- but able to take amoxil w/o troubles.    Tape     Rash from bandaids.      Medications Reviewed Today     Reviewed by Kathyrn Sheriff, [facility] (Pharmacist) on 10/15/21 at 1417  Med List Status: <None>   Medication Order Taking? Sig Documenting Provider Last Dose Status Informant  ACCU-CHEK AVIVA PLUS test strip [id number] Yes USE AS DIRECTED TO CHECK BLOOD SUGAR 2 TO 4 TIMES A DAY Joaquim Nam, MD Taking Active   Accu-Chek FastClix Lancets MISC [id number] Yes USE AS DIRECTED TO CHECK BLOOD SUGAR TWICE DAILY Joaquim Nam, MD Taking Active   amoxicillin (AMOXIL) 500 MG tablet [id number] Yes Take 4 tablets (2,000 mg total) by mouth daily as needed. Before dental visits. Dispense 5 courses Joaquim Nam, MD Taking Active   azelastine (ASTELIN) 0.1 % nasal spray [id number] Yes Place 2 sprays into both nostrils 2 (two) times daily as needed for rhinitis. Use in each nostril as directed Joaquim Nam, MD Taking Active   carvedilol (COREG) 6.25 MG tablet [id number] Yes TAKE 1 TABLET BY MOUTH TWICE A DAY. KEEPOFFICE VISIT Rollene Rotunda, MD Taking Active   cetirizine (ZYRTEC) 10 MG tablet [id number] Yes Take 10 mg by mouth daily. [provider] Taking Active   fluocinonide cream (LIDEX) 0.05 % [id number] Yes APPLY 1 APPLICATION TO AFFECTEDE AREAS 2 TIMES DAILY AS NEEDED Joaquim Nam, MD Taking Active  furosemide (LASIX) 20 MG tablet [id number] Yes TAKE 1 TABLET BY MOUTH ONCE DAILY Tonia Ghent, MD Taking Active   glipiZIDE (GLUCOTROL) 5 MG tablet [id number] Yes TAKE 2 TABLETS BY MOUTH DAILY BEFORE BREAKFAST IF AM SUGAR IS ABOVE 150 Tonia Ghent, MD Taking Active   Lancets Misc. (ACCU-CHEK FASTCLIX LANCET) KIT [id number] Yes AS DIRECTED Tonia Ghent, MD Taking Active   losartan (COZAAR) 100 MG tablet [id number] Yes Take 1 tablet (100 mg total) by mouth daily. Minus Breeding, MD Taking Active   metFORMIN (GLUCOPHAGE) 500 MG tablet  [id number] Yes TAKE 2 TABLETS BY MOUTH TWICE A DAY Tonia Ghent, MD Taking Active   omeprazole (PRILOSEC OTC) 20 MG tablet [id number] Yes Take 1 tablet (20 mg total) by mouth daily. Daryel November, MD Taking Active   potassium chloride (KLOR-CON) 10 MEQ tablet [id number] Yes TAKE 1 TABLET BY MOUTH TWICE A DAY Tonia Ghent, MD Taking Active   pravastatin (PRAVACHOL) 20 MG tablet [id number] Yes TAKE 1 TABLET BY MOUTH ONCE A DAY Tonia Ghent, MD Taking Active   psyllium (METAMUCIL) 58.6 % packet [id number] Yes Take 1 packet by mouth daily. [provider] Taking Active   tamsulosin (FLOMAX) 0.4 MG CAPS capsule [id number] Yes Take 1 capsule (0.4 mg total) by mouth daily. Tonia Ghent, MD Taking Active   warfarin (COUMADIN) 1 MG tablet [id number] Yes TAKE 1 TAB BY MOUTH DAILY, EXCEPT TAKE 2TABS ON FRIDAYS OR AS INSTRUCTED BY ANTICOAG CLINIC. (ALSO TAKE THE 3 MG TAB AS INSTRUCTD) Tonia Ghent, MD Taking Active   warfarin (COUMADIN) 3 MG tablet [id number] Yes TAKE 1 TABLET BY MOUTH ON SUNDAYS, TUESDAYS, WEDNESDAYS, THURSDAYS AND SATURDAYS OR AS DIRECTED BY ANTICOAGULATION CINIC Tonia Ghent, MD Taking Active             Patient Active Problem List   Diagnosis Date Noted   Lower urinary tract symptoms (LUTS) 10/07/2021   Trochanteric bursitis 07/04/2021   Dilated cardiomyopathy ([name]) 08/30/2019   HLD (hyperlipidemia) 12/23/2018   Long term (current) use of anticoagulants 01/23/2017   Health care maintenance 12/04/2016   Food impaction of esophagus    Esophageal stenosis    S/P AVR 06/20/2014   Advance care planning 05/12/2014   Rash 05/12/2014   Medicare annual wellness visit, subsequent 04/02/2013   Encounter for therapeutic drug monitoring 04/01/2013   Diabetes mellitus, type II ([place]) 11/05/2011   History of pancreatitis 08/27/2011   Anemia 01/01/2011   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   Shoulder pain, right 06/07/2010   Aortic valve replaced 02/15/2010    OBSTRUCTIVE SLEEP APNEA 10/05/2009   PALPITATIONS 09/19/2009   Atrial fibrillation ([place]) 08/15/2009   NEOP, UB, SKIN 08/08/2006   HYPERCHOLESTEROLEMIA, PURE 07/31/2006   OBESITY 07/17/2006   Essential hypertension 07/17/2006   GERD 07/17/2006    Immunization History  Administered Date(s) Administered   Fluad Quad(high Dose 65+) 11/19/2018, 12/27/2019, 09/29/2020, 10/04/2021   Influenza Split 10/25/2010, 11/28/2011   Influenza Whole 10/28/2009   Influenza,inj,Quad PF,6+ Mos 11/23/2012, 11/01/2013, 11/16/2014, 12/01/2015, 12/03/2016, 12/10/2017   PFIZER(Purple Top)SARS-COV-2 Vaccination 04/02/2019, 04/23/2019, 02/08/2020   Pneumococcal Conjugate-13 06/02/2017   Pneumococcal Polysaccharide-23 01/28/2009, 12/22/2018   Td 11/21/1997   Tdap 04/01/2013   Zoster Recombinat (Shingrix) 01/04/2019, 02/22/2019   Zoster, Live 05/16/2014    Conditions to be addressed/monitored:  Hypertension, Hyperlipidemia, Diabetes, and Atrial Fibrillation  There are no care plans that you recently modified to display for this patient.  Medication Assistance: None required.  Patient affirms  current coverage meets needs.  Compliance/Adherence/Medication fill history: Care Gaps: Kidney eval - due Colonoscopy (due 07/10/21)  Star-Rating Drugs: Glipizide - PDC 84% (LF 08/13/21) Metformin - PDC 92% Losartan - PDC 99% Pravastatin - PDC 94%  Medication Access: Within the past 30 days, how often has patient missed a dose of medication? 0 Is a pillbox or other method used to improve adherence? Yes  Factors that may affect medication adherence? no barriers identified Are meds synced by current pharmacy? No  Are meds delivered by current pharmacy? No  Does patient experience delays in picking up medications due to transportation concerns? No   [facility] Services Reviewed: Is patient disadvantaged to use [facility]?: No  Current Rx insurance plan: [employer] Name and location of Current pharmacy:   [place], [place] [address] [place] [address] Phone: [phone] Fax: [phone]  [facility] services reviewed with patient today?: No  Patient requests to transfer care to [facility] Pharmacy?: No  Reason patient declined to change pharmacies: Loyalty to other pharmacy/Patient preference   Care Plan and Follow Up Patient Decision:  Patient agrees to Care Plan and Follow-up.  Plan: Telephone follow up appointment with care management team member scheduled for:  2 weeks  [name], PharmD, BCACP Clinical Pharmacist [facility] [phone]   Current Barriers:  Unable to independently monitor therapeutic efficacy  Pharmacist Clinical Goal(s):  Patient will achieve adherence to monitoring guidelines and medication adherence to achieve therapeutic efficacy through collaboration with PharmD and provider.   Interventions: 1:1 collaboration with Tonia Ghent, MD regarding development and update of comprehensive plan of care as evidenced by provider attestation and co-signature Inter-disciplinary care team collaboration (see longitudinal plan of care) Comprehensive medication review performed; medication list updated in electronic medical record  Hypertension (BP goal <130/80) -Controlled -Current home readings: n/a -Hx AVR, cardiomyopathy, OSA -Current treatment: Carvedilol 6.25 mg BID - Appropriate, Effective, Safe, Accessible Furosemide 20 mg daily -Appropriate, Effective, Safe, Accessible Losartan 100 mg daily -Appropriate, Effective, Safe, Accessible -Medications previously tried: n/a  -Recommended to continue current medication  Hyperlipidemia: (LDL goal < 100) -Controlled - LDL 82 (12/2020) at goal -Current treatment: Pravastatin 20 mg daily - Appropriate, Effective, Safe, Accessible -Medications previously tried: n/a  -Educated on Cholesterol goals;  -Recommended to  continue current medication  Diabetes (A1c goal <7%) -Not ideally controlled - A1c 7.9% (09/2021) above goal;  -Pt previously was on Dexcom G6 but had rash with the adhesive -Current home glucose readings: 140-200s -Denies hypoglycemic/hyperglycemic symptoms -Current medications: Glipizide 5 mg - 2 tab daily - Appropriate, Query Effective Metformin 500 mg - 2 tab BID -Appropriate, Query Effective -Medications previously tried: Lantus, Dexcom G6 ([facility] Med 2021) -Educated on A1c and blood sugar goals; -Discussed Continuous glucose monitoring - Medicare will only cover this for patients on insulin or hx of severe hypoglcyemia, so patient does not qualify -Provided patient with Libre 3 sample and instructions for downloading the app on his phone, advised 2-week trial of device to get an idea of his glucose patterns -Recommended to continue current medication  Atrial Fibrillation (Goal: prevent stroke and major bleeding) -Controlled -CHADSVASC: 3 -Home BP and HR readings: n/a  -Current treatment: Carvedilol 6.25 mg BID - Appropriate, Effective, Safe, Accessible Warfarin 1-3 mg AD - Appropriate, Effective, Safe, Accessible -Medications previously tried: n/a -Counseled on increased risk of stroke due to Afib and benefits of anticoagulation for stroke prevention; -Recommended to continue current medication  Patient  Goals/Self-Care Activities Patient will:  - take medications as prescribed as evidenced by patient report and record review focus on medication adherence by routine check glucose using CGM, document, and provide at future appointments

## 2021-10-31 NOTE — Telephone Encounter (Signed)
Noted. Thanks.

## 2021-11-10 ENCOUNTER — Other Ambulatory Visit: Payer: Self-pay | Admitting: Family Medicine

## 2021-11-22 ENCOUNTER — Ambulatory Visit: Payer: Medicare PPO

## 2021-11-22 DIAGNOSIS — Z7901 Long term (current) use of anticoagulants: Secondary | ICD-10-CM

## 2021-11-22 LAB — POCT INR: INR: 4 — AB (ref 2.0–3.0)

## 2021-11-22 NOTE — Patient Instructions (Addendum)
Hold today's dose then decrease tomorrow's dose to one 1 mg tablet.  Then continue to take 4 mg daily except take 2 mg on Fridays. Recheck in 2 weeks.

## 2021-11-22 NOTE — Progress Notes (Signed)
Pt reports having a cold last week and he took alka seltzer q 12 hours for several days. Last dose was earlier this week. Advised pt that alka seltzer contains aspirin and that it can lead to elevated INR levels. Suggested to pt that next time he is sick to try taking Tylenol instead. INR today is 4.0.  Hold today's dose then decrease tomorrow's dose to one 1 mg tablet.  Then continue to take 4 mg daily except take 2 mg on Fridays. Recheck in 2 weeks.

## 2021-12-06 ENCOUNTER — Ambulatory Visit: Payer: Medicare PPO

## 2021-12-06 DIAGNOSIS — Z7901 Long term (current) use of anticoagulants: Secondary | ICD-10-CM | POA: Diagnosis not present

## 2021-12-06 LAB — POCT INR: INR: 3 (ref 2.0–3.0)

## 2021-12-06 NOTE — Progress Notes (Signed)
Continue to take 4 mg daily except take 2 mg on Fridays.  Pt requests INR to be checked with [facility] labs on 11/30. Order placed and noted in appt notes for lab appointment on 11/30.

## 2021-12-06 NOTE — Patient Instructions (Signed)
Continue to take 4 mg daily except take 2 mg on Fridays. Recheck with labs on 11/30.

## 2021-12-17 ENCOUNTER — Other Ambulatory Visit: Payer: Self-pay | Admitting: Cardiology

## 2021-12-22 ENCOUNTER — Other Ambulatory Visit: Payer: Self-pay | Admitting: Cardiology

## 2021-12-25 ENCOUNTER — Telehealth: Payer: Self-pay | Admitting: Family Medicine

## 2021-12-25 NOTE — Telephone Encounter (Signed)
LVM for pt to rtn my call to schedule AWV with [facility] call back # [phone] 

## 2021-12-27 ENCOUNTER — Telehealth: Payer: Self-pay

## 2021-12-27 ENCOUNTER — Other Ambulatory Visit (INDEPENDENT_AMBULATORY_CARE_PROVIDER_SITE_OTHER): Payer: Medicare PPO

## 2021-12-27 DIAGNOSIS — E119 Type 2 diabetes mellitus without complications: Secondary | ICD-10-CM

## 2021-12-27 DIAGNOSIS — R399 Unspecified symptoms and signs involving the genitourinary system: Secondary | ICD-10-CM | POA: Diagnosis not present

## 2021-12-27 DIAGNOSIS — Z7901 Long term (current) use of anticoagulants: Secondary | ICD-10-CM

## 2021-12-27 LAB — COMPREHENSIVE METABOLIC PANEL
ALT: 32 U/L (ref 0–53)
AST: 31 U/L (ref 0–37)
Albumin: 4.3 g/dL (ref 3.5–5.2)
Alkaline Phosphatase: 71 U/L (ref 39–117)
BUN: 11 mg/dL (ref 6–23)
CO2: 28 mEq/L (ref 19–32)
Calcium: 9.1 mg/dL (ref 8.4–10.5)
Chloride: 102 mEq/L (ref 96–112)
Creatinine, Ser: 1.05 mg/dL (ref 0.40–1.50)
GFR: 72.26 mL/min (ref >60.00)
Glucose, Bld: 181 mg/dL — ABNORMAL HIGH (ref 70–99)
Potassium: 4.7 mEq/L (ref 3.5–5.1)
Sodium: 138 mEq/L (ref 135–145)
Total Bilirubin: 0.7 mg/dL (ref 0.2–1.2)
Total Protein: 6.8 g/dL (ref 6.0–8.3)

## 2021-12-27 LAB — CBC WITH DIFFERENTIAL/PLATELET
Basophils Absolute: 0.1 10*3/uL (ref 0.0–0.1)
Basophils Relative: 1.3 % (ref 0.0–3.0)
Eosinophils Absolute: 0.2 10*3/uL (ref 0.0–0.7)
Eosinophils Relative: 4.9 % (ref 0.0–5.0)
HCT: 43.2 % (ref 39.0–52.0)
Hemoglobin: 14.8 g/dL (ref 13.0–17.0)
Lymphocytes Relative: 36.9 % (ref 12.0–46.0)
Lymphs Abs: 1.8 10*3/uL (ref 0.7–4.0)
MCHC: 34.4 g/dL (ref 30.0–36.0)
MCV: 91.7 fl (ref 78.0–100.0)
Monocytes Absolute: 0.5 10*3/uL (ref 0.1–1.0)
Monocytes Relative: 11.3 % (ref 3.0–12.0)
Neutro Abs: 2.2 10*3/uL (ref 1.4–7.7)
Neutrophils Relative %: 45.6 % (ref 43.0–77.0)
Platelets: 160 10*3/uL (ref 150.0–400.0)
RBC: 4.71 Mil/uL (ref 4.22–5.81)
RDW: 13.8 % (ref 11.5–15.5)
WBC: 4.8 10*3/uL (ref 4.0–10.5)

## 2021-12-27 LAB — HEMOGLOBIN A1C: Hgb A1c MFr Bld: 8.4 % — ABNORMAL HIGH (ref 4.6–6.5)

## 2021-12-27 LAB — LIPID PANEL
Cholesterol: 148 mg/dL (ref 0–200)
HDL: 45.6 mg/dL (ref >39.00)
LDL Cholesterol: 75 mg/dL (ref 0–99)
NonHDL: 102.36
Total CHOL/HDL Ratio: 3
Triglycerides: 139 mg/dL (ref 0.0–149.0)
VLDL: 27.8 mg/dL (ref 0.0–40.0)

## 2021-12-27 LAB — TSH: TSH: 1.99 u[IU]/mL (ref 0.35–5.50)

## 2021-12-27 LAB — PSA: PSA: 0.75 ng/mL (ref 0.10–4.00)

## 2021-12-27 NOTE — Telephone Encounter (Signed)
Received call from lab. States that INR was not ran not enough in the sample. Called patient left message to call back we need to have him come back in for lab.

## 2021-12-31 NOTE — Telephone Encounter (Signed)
Have called patient x 2 left message to call office do you know if he has a better number than what I am calling?

## 2022-01-02 ENCOUNTER — Ambulatory Visit (INDEPENDENT_AMBULATORY_CARE_PROVIDER_SITE_OTHER): Payer: Medicare PPO

## 2022-01-02 VITALS — Ht 70.0 in | Wt 241.0 lb

## 2022-01-02 DIAGNOSIS — Z0000 Encounter for general adult medical examination without abnormal findings: Secondary | ICD-10-CM | POA: Diagnosis not present

## 2022-01-02 NOTE — Patient Instructions (Addendum)
[name] , Thank you for taking time to come for your Medicare Wellness Visit. I appreciate your ongoing commitment to your health goals. Please review the following plan we discussed and let me know if I can assist you in the future.   These are the goals we discussed:     Goals Addressed             This Visit's Progress    Increase physical activity       Water aerobics       This is a list of the screening recommended for you and due dates:  Health Maintenance  Topic Date Due   Yearly kidney health urinalysis for diabetes  07/31/2007   Colon Cancer Screening  07/10/2021   Complete foot exam   01/01/2022   COVID-19 Vaccine (4 - 2023-24 season) 01/18/2022*   Eye exam for diabetics  06/06/2022   Hemoglobin A1C  06/27/2022   Yearly kidney function blood test for diabetes  12/28/2022   Medicare Annual Wellness Visit  01/03/2023   DTaP/Tdap/Td vaccine (3 - Td or Tdap) 04/02/2023   Pneumonia Vaccine  Completed   Flu Shot  Completed   Hepatitis C Screening: USPSTF Recommendation to screen - Ages 12-69 yo.  Completed   Zoster (Shingles) Vaccine  Completed   HPV Vaccine  Aged Out  *Topic was postponed. The date shown is not the original due date.    Advanced directives: on file  Conditions/risks identified: none new  Next appointment: Follow up in one year for your annual wellness visit.   Preventive Care 41 Years and Older, Male  Preventive care refers to lifestyle choices and visits with your health care provider that can promote health and wellness. What does preventive care include? A yearly physical exam. This is also called an annual well check. Dental exams once or twice a year. Routine eye exams. Ask your health care provider how often you should have your eyes checked. Personal lifestyle choices, including: Daily care of your teeth and gums. Regular physical activity. Eating a healthy diet. Avoiding tobacco and drug use. Limiting alcohol  use. Practicing safe sex. Taking low doses of aspirin every day. Taking vitamin and mineral supplements as recommended by your health care provider. What happens during an annual well check? The services and screenings done by your health care provider during your annual well check will depend on your age, overall health, lifestyle risk factors, and family history of disease. Counseling  Your health care provider may ask you questions about your: Alcohol use. Tobacco use. Drug use. Emotional well-being. Home and relationship well-being. Sexual activity. Eating habits. History of falls. Memory and ability to understand (cognition). Work and work Statistician. Screening  You may have the following tests or measurements: Height, weight, and BMI. Blood pressure. Lipid and cholesterol levels. These may be checked every 5 years, or more frequently if you are over 66 years old. Skin check. Lung cancer screening. You may have this screening every year starting at age 59 if you have a 30-pack-year history of smoking and currently smoke or have quit within the past 15 years. Fecal occult blood test (FOBT) of the stool. You may have this test every year starting at age 29. Flexible sigmoidoscopy or colonoscopy. You may have a sigmoidoscopy every 5 years or a colonoscopy every 10 years starting at age 91. Prostate cancer screening. Recommendations will vary depending on your family history and other risks. Hepatitis C blood test. Hepatitis B blood test. Sexually  transmitted disease (STD) testing. Diabetes screening. This is done by checking your blood sugar (glucose) after you have not eaten for a while (fasting). You may have this done every 1-3 years. Abdominal aortic aneurysm (AAA) screening. You may need this if you are a current or former smoker. Osteoporosis. You may be screened starting at age 49 if you are at high risk. Talk with your health care provider about your test results,  treatment options, and if necessary, the need for more tests. Vaccines  Your health care provider may recommend certain vaccines, such as: Influenza vaccine. This is recommended every year. Tetanus, diphtheria, and acellular pertussis (Tdap, Td) vaccine. You may need a Td booster every 10 years. Zoster vaccine. You may need this after age 69. Pneumococcal 13-valent conjugate (PCV13) vaccine. One dose is recommended after age 23. Pneumococcal polysaccharide (PPSV23) vaccine. One dose is recommended after age 37. Talk to your health care provider about which screenings and vaccines you need and how often you need them. This information is not intended to replace advice given to you by your health care provider. Make sure you discuss any questions you have with your health care provider. Document Released: 02/10/2015 Document Revised: 10/04/2015 Document Reviewed: 11/15/2014 Elsevier Interactive Patient Education  2017 [place] Prevention in the Home Falls can cause injuries. They can happen to people of all ages. There are many things you can do to make your home safe and to help prevent falls. What can I do on the outside of my home? Regularly fix the edges of walkways and driveways and fix any cracks. Remove anything that might make you trip as you walk through a door, such as a raised step or threshold. Trim any bushes or trees on the path to your home. Use bright outdoor lighting. Clear any walking paths of anything that might make someone trip, such as rocks or tools. Regularly check to see if handrails are loose or broken. Make sure that both sides of any steps have handrails. Any raised decks and porches should have guardrails on the edges. Have any leaves, snow, or ice cleared regularly. Use sand or salt on walking paths during winter. Clean up any spills in your garage right away. This includes oil or grease spills. What can I do in the bathroom? Use night  lights. Install grab bars by the toilet and in the tub and shower. Do not use towel bars as grab bars. Use non-skid mats or decals in the tub or shower. If you need to sit down in the shower, use a plastic, non-slip stool. Keep the floor dry. Clean up any water that spills on the floor as soon as it happens. Remove soap buildup in the tub or shower regularly. Attach bath mats securely with double-sided non-slip rug tape. Do not have throw rugs and other things on the floor that can make you trip. What can I do in the bedroom? Use night lights. Make sure that you have a light by your bed that is easy to reach. Do not use any sheets or blankets that are too big for your bed. They should not hang down onto the floor. Have a firm chair that has side arms. You can use this for support while you get dressed. Do not have throw rugs and other things on the floor that can make you trip. What can I do in the kitchen? Clean up any spills right away. Avoid walking on wet floors. Keep items that you use  a lot in easy-to-reach places. If you need to reach something above you, use a strong step stool that has a grab bar. Keep electrical cords out of the way. Do not use floor polish or wax that makes floors slippery. If you must use wax, use non-skid floor wax. Do not have throw rugs and other things on the floor that can make you trip. What can I do with my stairs? Do not leave any items on the stairs. Make sure that there are handrails on both sides of the stairs and use them. Fix handrails that are broken or loose. Make sure that handrails are as long as the stairways. Check any carpeting to make sure that it is firmly attached to the stairs. Fix any carpet that is loose or worn. Avoid having throw rugs at the top or bottom of the stairs. If you do have throw rugs, attach them to the floor with carpet tape. Make sure that you have a light switch at the top of the stairs and the bottom of the stairs. If  you do not have them, ask someone to add them for you. What else can I do to help prevent falls? Wear shoes that: Do not have high heels. Have rubber bottoms. Are comfortable and fit you well. Are closed at the toe. Do not wear sandals. If you use a stepladder: Make sure that it is fully opened. Do not climb a closed stepladder. Make sure that both sides of the stepladder are locked into place. Ask someone to hold it for you, if possible. Clearly mark and make sure that you can see: Any grab bars or handrails. First and last steps. Where the edge of each step is. Use tools that help you move around (mobility aids) if they are needed. These include: Canes. Walkers. Scooters. Crutches. Turn on the lights when you go into a dark area. Replace any light bulbs as soon as they burn out. Set up your furniture so you have a clear path. Avoid moving your furniture around. If any of your floors are uneven, fix them. If there are any pets around you, be aware of where they are. Review your medicines with your doctor. Some medicines can make you feel dizzy. This can increase your chance of falling. Ask your doctor what other things that you can do to help prevent falls. This information is not intended to replace advice given to you by your health care provider. Make sure you discuss any questions you have with your health care provider. Document Released: 11/10/2008 Document Revised: 06/22/2015 Document Reviewed: 02/18/2014 Elsevier Interactive Patient Education  2017 [employer].

## 2022-01-02 NOTE — Progress Notes (Signed)
Subjective:   [name] [name] is a 71 y.o. male who presents for Medicare Annual/Subsequent preventive examination.  Review of Systems    No ROS.  Medicare Wellness Virtual Visit.  Visual/audio telehealth visit, UTA vital signs.   See social history for additional risk factors.         Objective:    Today's Vitals   01/02/22 1024  Weight: 241 lb (109.3 kg)  Height: _0  (1.778 m)   Body mass index is 34.58 kg/m.     01/02/2022   10:22 AM 03/10/2021    5:33 PM 12/16/2018    3:46 PM 12/10/2017    9:04 AM 12/02/2016    1:42 PM 02/06/2016    9:08 AM 01/22/2016   11:27 PM  Advanced Directives  Does Patient Have a Medical Advance Directive? Yes No Yes Yes No No No  Type of Paramedic of [place];Living will  [place];Living will [place];Living will     Does patient want to make changes to medical advance directive? No - Patient declined        Copy of [place] in Chart? Yes - validated most recent copy scanned in chart (See row information)  No - copy requested Yes - validated most recent copy scanned in chart (See row information)     Would patient like information on creating a medical advance directive?  No - Patient declined    No - Patient declined No - Patient declined    Current Medications (verified) Outpatient Encounter Medications as of 01/02/2022  Medication Sig   ACCU-CHEK AVIVA PLUS test strip USE AS DIRECTED TO CHECK BLOOD SUGAR 2 TO 4 TIMES A DAY   Accu-Chek FastClix Lancets MISC USE AS DIRECTED TO CHECK BLOOD SUGAR TWICE DAILY   amoxicillin (AMOXIL) 500 MG tablet Take 4 tablets (2,000 mg total) by mouth daily as needed. Before dental visits. Dispense 5 courses   azelastine (ASTELIN) 0.1 % nasal spray Place 2 sprays into both nostrils 2 (two) times daily as needed for rhinitis. Use in each nostril as directed   carvedilol (COREG) 6.25 MG tablet TAKE 1 TABLET BY MOUTH TWICE A  DAY. KEEPOFFICE VISIT   cetirizine (ZYRTEC) 10 MG tablet Take 10 mg by mouth daily.   fluocinonide cream (LIDEX) 7.32 % APPLY 1 APPLICATION TO AFFECTEDE AREAS 2 TIMES DAILY AS NEEDED   furosemide (LASIX) 20 MG tablet TAKE 1 TABLET BY MOUTH ONCE DAILY   glipiZIDE (GLUCOTROL) 5 MG tablet TAKE 2 TABLETS BY MOUTH DAILY BEFORE BREAKFAST IF AM SUGAR IS ABOVE 150   Lancets Misc. (ACCU-CHEK FASTCLIX LANCET) KIT AS DIRECTED   losartan (COZAAR) 100 MG tablet Take 1 tablet (100 mg total) by mouth daily. Keep scheduled appointment for further refills   metFORMIN (GLUCOPHAGE) 500 MG tablet TAKE 2 TABLETS BY MOUTH TWICE A DAY   omeprazole (PRILOSEC OTC) 20 MG tablet Take 1 tablet (20 mg total) by mouth daily.   potassium chloride (KLOR-CON) 10 MEQ tablet TAKE 1 TABLET BY MOUTH TWICE A DAY   pravastatin (PRAVACHOL) 20 MG tablet TAKE 1 TABLET BY MOUTH ONCE A DAY   psyllium (METAMUCIL) 58.6 % packet Take 1 packet by mouth daily.   tamsulosin (FLOMAX) 0.4 MG CAPS capsule Take 1 capsule (0.4 mg total) by mouth daily.   warfarin (COUMADIN) 1 MG tablet TAKE 1 TAB BY MOUTH DAILY, EXCEPT TAKE 2TABS ON FRIDAYS OR AS INSTRUCTED BY ANTICOAG CLINIC. (ALSO TAKE THE  3 MG TAB AS INSTRUCTD)   warfarin (COUMADIN) 3 MG tablet TAKE 1 TABLET BY MOUTH ON SUNDAYS, TUESDAYS, WEDNESDAYS, THURSDAYS AND SATURDAYS OR AS DIRECTED BY ANTICOAGULATION CINIC   warfarin (COUMADIN) 3 MG tablet TAKE ONE 3 MG TABLET BY MOUTH DAILY AND ONE 1 MG TABLET BY MOUTH DAILY (4 MG TOTAL) EXCEPT TAKE TWO 1 MG TABLETS (2 MG TOTAL) ON FRIDAYS OR AS DIRECTED BY ANTICOAGULATION CLINIC.   No facility-administered encounter medications on file as of 01/02/2022.    Allergies (verified) Ace inhibitors, Aspirin, Other, Primaxin [imipenem w/cilastatin sodium], and Tape   History: Past Medical History:  Diagnosis Date   Anemia    Aortic insufficiency    with bicuspid aortic valve (the last MRI demonstrated the aortic root to be 4.8 x 4.7 cm.) Mod regurgitation.  Normal chamber size with very mild left ventricular dysfunction.)   Blood transfusion    CHF (congestive heart failure) (HCC)    Clotting disorder ([place])    Cough 06/28/2009   Dr. 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   Pericardial effusion 07/2009   admission to [facility], s/p pericardial window   Pleural effusion 10/2009   s/p thoracentesis   PNA (pneumonia) 10/2009   [facility]mn[facility] to [facility], with right pleural effusion, s/p thoracentesis   Sleep apnea    Syncope 08/2009   admission for fever and syncope, no source for fever seen on cultures. 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 small/apical infarct EF 50%   DOPPLER ECHOCARDIOGRAPHY  01/18/97   LVH, mild dilated aorta - root mod to severe aortic regurg EF 50-55%   DOPPLER ECHOCARDIOGRAPHY  09/27/03   No changes, moderate severe aortic regurg   DOPPLER ECHOCARDIOGRAPHY  07/09/2004   Mod severe aortic regurg 2-3+ T.R., T.I.R., mild P..R.   DOPPLER ECHOCARDIOGRAPHY  04/01/06   Hypokin Apex EF 55%   DOPPLER ECHOCARDIOGRAPHY  02/15/2010   Mild LVH, mild decr Sys fctn EF 40-45% Mild Dias Dysfctn Mech AV Triv AR, MR   ESOPHAGOGASTRODUODENOSCOPY  1990, 1999   Hot dog in throat (1987)/ Chicken in throat (1990)/ 01/06/97 (Dr. Earlean Shawl) Impaction, dilatation, Schatzki's Ring/ 07/1998 repeat Dilatation/ 01/10/01 stricture/dilated/ 03/08/05 Schatzki's Ring, dilated/ 06/01/08 Food Disimpaction [name] Bulb Ulcer (Dr. Watt Climes)   ESOPHAGOGASTRODUODENOSCOPY  01/06/1997    (Dr. Earlean Shawl) Impaction, dilatation, Schatzki's Ring   ESOPHAGOGASTRODUODENOSCOPY  08/09/98   Repeat dilatation Deatra Ina)   ESOPHAGOGASTRODUODENOSCOPY  01/13/2001   stricture/ HH, duodenal ulcer / bleeding Henrene Pastor)   ESOPHAGOGASTRODUODENOSCOPY  12/25/01   esophagus stricture, dilated   ESOPHAGOGASTRODUODENOSCOPY  03/23/2001   stricture, dilated   ESOPHAGOGASTRODUODENOSCOPY  03/08/05   Schatzki's Ring   03/13/05  Schatzki's Ring - dilated, duodenitis   ESOPHAGOGASTRODUODENOSCOPY  06/11/08   with food disimpaction.  Food impaction Ms [place]  Sm bulb ulcer (Dr. Watt Climes)   ESOPHAGOGASTRODUODENOSCOPY     stricture/HH, duodenal ulcer/bleeding Henrene Pastor)   ESOPHAGOGASTRODUODENOSCOPY N/A 01/23/2016   Procedure: ESOPHAGOGASTRODUODENOSCOPY (EGD);  Surgeon:  [name], MD;  Location: [facility] ENDOSCOPY;  Service: Endoscopy;  Laterality: N/A;   ESOPHAGOGASTRODUODENOSCOPY N/A 03/10/2021   Procedure: ESOPHAGOGASTRODUODENOSCOPY (EGD);  Surgeon: Daryel November, MD;  Location: [facility];  Service: Gastroenterology;  Laterality: N/A;   ESOPHAGOGASTRODUODENOSCOPY (EGD) WITH PROPOFOL N/A 02/06/2016   Procedure: ESOPHAGOGASTRODUODENOSCOPY (EGD) WITH PROPOFOL;  Surgeon: [name], MD;  Location: [facility] ENDOSCOPY;  Service: Endoscopy;  Laterality: N/A;   FOREIGN BODY REMOVAL  03/10/2021   Procedure: FOREIGN BODY REMOVAL;  Surgeon: Daryel November, MD;  Location: [facility] ENDOSCOPY;  Service: Gastroenterology;;   HERNIA REPAIR  2011   abdominal; post AVR   MRI  2/05   LVH global hypokin EF 48% Mod A.I.   VALVE REPLACEMENT  06/2009   St. Jude mechanical valve per Dr. Cyndia Bent   Family History  Problem Relation Age of Onset   Obesity Mother        bedridden   Hypertension Mother    Heart disease Father        CABG, CAD   Hypertension Father    Lymphoma Father    Cancer Father        lymphoma   Alcohol abuse Brother    Cancer Brother        melanoma   Colon cancer Paternal Aunt    Stomach cancer Neg Hx    Rectal  cancer Neg Hx    Prostate cancer Neg Hx    Social History   Socioeconomic History   Marital status: Married    Spouse name: Not on file   Number of children: 2   Years of education: Not on file   Highest education level: Not on file  Occupational History   Occupation: mech. supervisor    Employer: [employer]    Comment: [place]  Tobacco Use   Smoking status: Never   Smokeless tobacco: Never  Vaping Use   Vaping Use: Never used  Substance and Sexual Activity   Alcohol use: No    Alcohol/week: 0.0 standard drinks of alcohol   Drug use: No   Sexual activity: Not on file  Other Topics Concern   Not on file  Social History Narrative   Married 1996   Social Determinants of Health   Financial Resource Strain: Low Risk  (01/02/2022)   Overall Financial Resource Strain (CARDIA)    Difficulty of Paying Living Expenses: Not hard at all  Food Insecurity: No Food Insecurity (12/16/2018)   Hunger Vital Sign    Worried About Running Out of Food in the Last Year: Never true    Ran Out of Food in the Last Year: Never true  Transportation Needs: No Transportation Needs (01/02/2022)   PRAPARE - Hydrologist (Medical): No    Lack of Transportation (Non-Medical): No  Physical Activity: Inactive (12/16/2018)   Exercise Vital Sign    Days of Exercise per Week: 0 days    Minutes of Exercise per Session: 0 min  Stress: No Stress Concern Present (01/02/2022)   [place]    Feeling of Stress : Not at all  Social Connections: Unknown (01/02/2022)   Social Connection and Isolation Panel [NHANES]    Frequency of Communication with Friends and Family: Not on file    Frequency of Social Gatherings with Friends and Family: Not on file    Attends Religious Services: Not on file    Active Member of Clubs or Organizations: Not on file  Attends Music therapist: Not on file     Marital Status: Married    Tobacco Counseling Counseling given: Not Answered   Clinical Intake:  Pre-visit preparation completed: Yes        Diabetes: Yes (Followed by PCP)  How often do you need to have someone help you when you read instructions, pamphlets, or other written materials from your doctor or pharmacy?: 1 - Never  Nutrition Risk Assessment: Has the patient had any N/V/D within the last 2 months?  No  Does the patient have any non-healing wounds?  No  Has the patient had any unintentional weight loss or weight gain?  No   Diabetes: Is the patient diabetic?  Yes  If diabetic, was a CBG obtained today?  Yes , FB 163 Did the patient bring in their glucometer from home?  No  How often do you monitor your CBG's? daily.   Financial Strains and Diabetes Management: Are you having any financial strains with the device, your supplies or your medication? No .  Does the patient want to be seen by Chronic Care Management for management of their diabetes?  No  Would the patient like to be referred to a Nutritionist or for Diabetic Management?  No     Interpreter Needed?: No      Activities of Daily Living     No data to display          Patient Care Team: Tonia Ghent, MD as PCP - General (Family Medicine) Minus Breeding, MD as PCP - Cardiology (Cardiology) Erroll Luna, MD as Consulting Physician (General Surgery) Thelma Comp, [place] (Optometry) [place], Cleaster Corin, [facility] as Pharmacist (Pharmacist)  Indicate any recent Medical Services you may have received from other than [facility] providers in the past year (date may be approximate).     Assessment:   This is a routine wellness examination for [place].  I connected with  [name] on 01/02/22 by a audio enabled telemedicine application and verified that I am speaking with the correct person using two identifiers.  Patient Location: Home  Provider Location: Office/Clinic  I discussed  the limitations of evaluation and management by telemedicine. The patient expressed understanding and agreed to proceed.   Hearing/Vision screen Hearing Screening - Comments:: Bilateral hearing aids   Dietary issues and exercise activities discussed:     Goals Addressed             This Visit's Progress    Increase physical activity       Water aerobics       Depression Screen    01/02/2022   10:21 AM 01/01/2021   11:05 AM 12/27/2019    9:00 AM 12/16/2018    3:47 PM 12/10/2017    9:04 AM 12/02/2016    1:41 PM 05/18/2015    9:52 AM  PHQ 2/9 Scores  PHQ - 2 Score 0 0 0 0 0 0 0  PHQ- 9 Score    0 0 0     Fall Risk    01/01/2021   11:05 AM 12/27/2019    9:00 AM 12/16/2018    3:46 PM 12/10/2017    9:04 AM 12/02/2016    1:41 PM  [place] in the past year? 0 0 0 0 No  Number falls in past yr: 0 0 0    Injury with Fall? 0 0 0    Risk for fall due to : No Fall Risks  Medication side  effect    Follow up Falls evaluation completed Falls evaluation completed Falls evaluation completed;Falls prevention discussed      FALL RISK PREVENTION PERTAINING TO THE HOME: Home free of loose throw rugs in walkways, pet beds, electrical cords, etc? Yes  Adequate lighting in your home to reduce risk of falls? Yes   ASSISTIVE DEVICES UTILIZED TO PREVENT FALLS: Life alert? No  Use of a cane, walker or w/c? No  Grab bars in the bathroom? Yes  Shower chair or bench in shower? Yes  Elevated toilet seat or a handicapped toilet? No   TIMED UP AND GO: Was the test performed? No .   Cognitive Function:    12/16/2018    3:49 PM 12/10/2017    9:04 AM 12/02/2016    1:41 PM  MMSE - Mini Mental State Exam  Orientation to time _0 Orientation to Place _1 Registration _2 Attention/ Calculation 5 0 0  Recall _3 Language- name 2 objects  0 0  Language- repeat _4 Language- follow 3 step command  3 3  Language- read & follow direction  0 0  Write a sentence  0  0  Copy design  0 0  Total score  20 20        Immunizations Immunization History  Administered Date(s) Administered   Fluad Quad(high Dose 65+) 11/19/2018, 12/27/2019, 09/29/2020, 10/04/2021   Influenza Split 10/25/2010, 11/28/2011   Influenza Whole 10/28/2009   Influenza,inj,Quad PF,6+ Mos 11/23/2012, 11/01/2013, 11/16/2014, 12/01/2015, 12/03/2016, 12/10/2017   PFIZER(Purple Top)SARS-COV-2 Vaccination 04/02/2019, 04/23/2019, 02/08/2020   Pneumococcal Conjugate-13 06/02/2017   Pneumococcal Polysaccharide-23 01/28/2009, 12/22/2018   Td 11/21/1997   Tdap 04/01/2013   Zoster Recombinat (Shingrix) 01/04/2019, 02/22/2019   Zoster, Live 05/16/2014   Covid-19 vaccine status: Completed vaccines   Screening Tests Health Maintenance  Topic Date Due   Diabetic kidney evaluation - Urine ACR  07/31/2007   COLONOSCOPY (Pts 45-47yr Insurance coverage will need to be confirmed)  07/10/2021   FOOT EXAM  01/01/2022   COVID-19 Vaccine (4 - 2023-24 season) 01/18/2022 (Originally 09/28/2021)   OPHTHALMOLOGY EXAM  06/06/2022   HEMOGLOBIN A1C  06/27/2022   Diabetic kidney evaluation - GFR measurement  12/28/2022   Medicare Annual Wellness (AWV)  01/03/2023   DTaP/Tdap/Td (3 - Td or Tdap) 04/02/2023   Pneumonia Vaccine 71 Years old  Completed   INFLUENZA VACCINE  Completed   Hepatitis C Screening  Completed   Zoster Vaccines- Shingrix  Completed   HPV VACCINES  Aged Out    Health Maintenance  Health Maintenance Due  Topic Date Due   Diabetic kidney evaluation - Urine ACR  07/31/2007   COLONOSCOPY (Pts 45-49yrInsurance coverage will need to be confirmed)  07/10/2021   FOOT EXAM  01/01/2022    Colonoscopy- notes cologuard completed. Awaiting update in chart.   Lung Cancer Screening: (Low Dose CT Chest recommended if Age 113-80ears, 30 pack-year currently smoking OR have quit w/in 15years.) does not qualify.   Vision Screening: Recommended annual ophthalmology exams for early  detection of glaucoma and other disorders of the eye.  Dental Screening: Recommended annual dental exams for proper oral hygiene.  Community Resource Referral / Chronic Care Management: CRR required this visit?  No   CCM required this visit?  No      Plan:     I have personally reviewed and noted the following in the patient's chart:  Medical and social history Use of alcohol, tobacco or illicit drugs  Current medications and supplements including opioid prescriptions. Patient is not currently taking opioid prescriptions. Functional ability and status Nutritional status Physical activity Advanced directives List of other physicians Hospitalizations, surgeries, and ER visits in previous 12 months Vitals Screenings to include cognitive, depression, and falls Referrals and appointments  In addition, I have reviewed and discussed with patient certain preventive protocols, quality metrics, and best practice recommendations. A written personalized care plan for preventive services as well as general preventive health recommendations were provided to patient.     Leta Jungling, LPN   08/03/8086

## 2022-01-03 ENCOUNTER — Encounter: Payer: Self-pay | Admitting: Family Medicine

## 2022-01-03 ENCOUNTER — Ambulatory Visit: Payer: Medicare PPO

## 2022-01-03 ENCOUNTER — Ambulatory Visit (INDEPENDENT_AMBULATORY_CARE_PROVIDER_SITE_OTHER): Payer: Medicare PPO | Admitting: Family Medicine

## 2022-01-03 VITALS — BP 122/60 | HR 74 | Temp 97.1°F | Ht 70.0 in | Wt 241.0 lb

## 2022-01-03 DIAGNOSIS — E119 Type 2 diabetes mellitus without complications: Secondary | ICD-10-CM | POA: Diagnosis not present

## 2022-01-03 DIAGNOSIS — I10 Essential (primary) hypertension: Secondary | ICD-10-CM

## 2022-01-03 DIAGNOSIS — E785 Hyperlipidemia, unspecified: Secondary | ICD-10-CM | POA: Diagnosis not present

## 2022-01-03 DIAGNOSIS — Z7901 Long term (current) use of anticoagulants: Secondary | ICD-10-CM | POA: Diagnosis not present

## 2022-01-03 DIAGNOSIS — Z952 Presence of prosthetic heart valve: Secondary | ICD-10-CM | POA: Diagnosis not present

## 2022-01-03 DIAGNOSIS — Z7189 Other specified counseling: Secondary | ICD-10-CM

## 2022-01-03 DIAGNOSIS — Z0000 Encounter for general adult medical examination without abnormal findings: Secondary | ICD-10-CM

## 2022-01-03 LAB — MICROALBUMIN / CREATININE URINE RATIO
Creatinine,U: 79.6 mg/dL
Microalb Creat Ratio: 1.2 mg/g (ref 0.0–30.0)
Microalb, Ur: 0.9 mg/dL (ref 0.0–1.9)

## 2022-01-03 LAB — POCT INR: INR: 2.7 (ref 2.0–3.0)

## 2022-01-03 MED ORDER — METFORMIN HCL 500 MG PO TABS: 1000.0000 mg | ORAL_TABLET | Freq: Two times a day (BID) | ORAL | 3 refills | Status: DC

## 2022-01-03 NOTE — Progress Notes (Signed)
Continue to take 4 mg daily except take 2 mg on Fridays. Recheck in 6 weeks.

## 2022-01-03 NOTE — Patient Instructions (Addendum)
Pre visit review using our clinic review tool, if applicable. No additional management support is needed unless otherwise documented below in the visit note.  Continue to take 4 mg daily except take 2 mg on Fridays. Recheck in 6 weeks.

## 2022-01-03 NOTE — Patient Instructions (Addendum)
Go to the lab on the way out.   If you have mychart we'll likely use that to update you.    Take care.  Glad to see you. Recheck A1c at a visit in about 3 months.  Keep working on diet and exercise in the meantime. Update me as needed.

## 2022-01-03 NOTE — Progress Notes (Signed)
Diabetes:  Using medications without difficulties: yes Hypoglycemic episodes: no Hyperglycemic episodes: no Feet problems:no Blood Sugars averaging: ~160.  Has improved with more activity- raking more leaves and doing more yardwork.   eye exam within last year: yes Metformin and glipizide.   A1c 7.9--->8.4 now, d/w pt.   Labs d/w pt.   See notes on labs regarding microalbumin.  INR 2.7 today.    Hypertension:    Using medication without problems or lightheadedness: yes Chest pain with exertion:no Edema:no Short of breath:no  Elevated Cholesterol: Using medications without problems: yes Muscle aches: occ cramping but not exertional.  Improved with taking mustard.  He'll update me as needed.   Diet compliance: yes Exercise: yes  Flu 2023 PNA 2020 Shingles prev done.   Tetanus 2015 Covid vaccine 2023 RSV 2023.   Colonoscopy 2013, IFOB neg 2023.    PSA wnl 2023.   Advance directive- wife designated if patient were incapacitated.  PMH and SH reviewed Meds, vitals, and allergies reviewed.   ROS: Per HPI unless specifically indicated in ROS section   GEN: nad, alert and oriented HEENT: mucous membranes moist NECK: supple w/o LA CV: rrr. PULM: ctab, no inc wob ABD: soft, +bs EXT: no edema SKIN: no acute rash  Diabetic foot exam: Normal inspection No skin breakdown No calluses  Normal DP pulses Normal sensation to light touch and monofilament Nails normal

## 2022-01-06 NOTE — Assessment & Plan Note (Signed)
Continue pravastatin 

## 2022-01-06 NOTE — Assessment & Plan Note (Signed)
Continue carvedilol furosemide losartan

## 2022-01-06 NOTE — Assessment & Plan Note (Signed)
Flu 2023 PNA 2020 Shingles prev done.   Tetanus 2015 Covid vaccine 2023 RSV 2023.   Colonoscopy 2013, IFOB neg 2023.    PSA wnl 2023.   Advance directive- wife designated if patient were incapacitated.

## 2022-01-06 NOTE — Assessment & Plan Note (Signed)
History of, with click noted on exam as expected.  Continue Coumadin.

## 2022-01-06 NOTE — Assessment & Plan Note (Signed)
Taking Metformin and glipizide.   A1c 7.9--->8.4 now, d/w pt.   Labs d/w pt.   He thinks he can make a significant improvement in his diet and exercise to help with his sugar and then recheck in a few months.  See after visit summary.  Continue metformin and glipizide.

## 2022-01-06 NOTE — Assessment & Plan Note (Signed)
Advance directive- wife designated if patient were incapacitated.  

## 2022-01-15 NOTE — Progress Notes (Unsigned)
Cardiology Office Note   Date:  01/17/2022   ID:  [name] [name], DOB March 03, 1950, MRN [id number]  PCP:  Tonia Ghent, MD  Cardiologist:   Minus Breeding, MD   Chief Complaint  Patient presents with   AVR      History of Present Illness: [name] is a 71 y.o. male who presents for follow up after aortic valve replacement.  Since I last saw him an echo in January.  The AVR was stable.  The EF was 45 to 50%.  This was the same as previous or slightly reduced.  He has felt okay.  He has not been having any new shortness of breath, PND or orthopnea.  He denies any chest pressure, neck or arm discomfort.  He walks the dog before going every morning.  He has no symptoms related to this.   Past Medical History:  Diagnosis Date   Anemia    Aortic insufficiency    with bicuspid aortic valve (the last MRI demonstrated the aortic root to be 4.8 x 4.7 cm.) Mod regurgitation. Normal chamber size with very mild left ventricular dysfunction.)   Blood transfusion    CHF (congestive heart failure) (HCC)    Clotting disorder ([place])    Cough 06/28/2009   Dr. Melvyn Novas   Diabetes mellitus without complication ([place])    Duodenal ulcer    with GI bleeding   Dyslipidemia    GERD (gastroesophageal reflux disease) 03/23/2001   egd w/ dilation...Marland KitchenMarland KitchenKaplan   Hemorrhoids    Hyperlipidemia    Hypertension    Necrotizing pancreatitis 09/2009   admission, and pseudocyst formation, also with ATN   Orthostasis 08/2009   Fever and syncope, no source for fever on cultures.   Pericardial effusion 07/2009   admission to [facility], s/p pericardial window   Pleural effusion 10/2009   s/p thoracentesis   PNA (pneumonia) 10/2009   admnission to [facility], with right pleural effusion, s/p thoracentesis   Sleep apnea    Syncope 08/2009   admission for fever and syncope, no source for fever seen on cultures. Syncope thought to be r/t orthostasis    Past Surgical History:  Procedure Laterality Date    CARDIAC VALVE REPLACEMENT  07/10/2009   AVR   Cardiolite EKG  03/10/2003   (-) ? small/apical infarct EF 50%   DOPPLER ECHOCARDIOGRAPHY  01/18/97   LVH, mild dilated aorta - root mod to severe aortic regurg EF 50-55%   DOPPLER ECHOCARDIOGRAPHY  09/27/03   No changes, moderate severe aortic regurg   DOPPLER ECHOCARDIOGRAPHY  07/09/2004   Mod severe aortic regurg 2-3+ T.R., T.I.R., mild P..R.   DOPPLER ECHOCARDIOGRAPHY  04/01/06   Hypokin Apex EF 55%   DOPPLER ECHOCARDIOGRAPHY  02/15/2010   Mild LVH, mild decr Sys fctn EF 40-45% Mild Dias Dysfctn Mech AV Triv AR, MR   ESOPHAGOGASTRODUODENOSCOPY  1990, 1999   Hot dog in throat (1987)/ Chicken in throat (1990)/ 01/06/97 (Dr. Earlean Shawl) Impaction, dilatation, Schatzki's Ring/ 07/1998 repeat Dilatation/ 01/10/01 stricture/dilated/ 03/08/05 Schatzki's Ring, dilated/ 06/01/08 Food Disimpaction [name] Bulb Ulcer (Dr. Watt Climes)   ESOPHAGOGASTRODUODENOSCOPY  01/06/1997   (Dr. Earlean Shawl) Impaction, dilatation, Schatzki's Ring   ESOPHAGOGASTRODUODENOSCOPY  08/09/98   Repeat dilatation Deatra Ina)   ESOPHAGOGASTRODUODENOSCOPY  01/13/2001   stricture/ HH, duodenal ulcer / bleeding Henrene Pastor)   ESOPHAGOGASTRODUODENOSCOPY  12/25/01   esophagus stricture, dilated   ESOPHAGOGASTRODUODENOSCOPY  03/23/2001   stricture, dilated   ESOPHAGOGASTRODUODENOSCOPY  03/08/05   Schatzki's Ring  03/13/05  Schatzki's Ring - dilated, duodenitis   ESOPHAGOGASTRODUODENOSCOPY  06/11/08   with food disimpaction.  Food impaction Ms [place]  Sm bulb ulcer (Dr. Watt Climes)   ESOPHAGOGASTRODUODENOSCOPY     stricture/HH, duodenal ulcer/bleeding Henrene Pastor)   ESOPHAGOGASTRODUODENOSCOPY N/A 01/23/2016   Procedure: ESOPHAGOGASTRODUODENOSCOPY (EGD);  Surgeon: [name], MD;  Location[facility] ENDOSCOPY;  Service: Endoscopy;  Laterality: N/A;   ESOPHAGOGASTRODUODENOSCOPY N/A 03/10/2021   Procedure: ESOPHAGOGASTRODUODENOSCOPY (EGD);  Surgeon: Daryel November, MD;  Location: [facility] ENDOSCOPY;  Service: Gastroenterology;   Laterality: N/A;   ESOPHAGOGASTRODUODENOSCOPY (EGD) WITH PROPOFOL N/A 02/06/2016   Procedure: ESOPHAGOGASTRODUODENOSCOPY (EGD) WITH PROPOFOL;  Surgeon: [name], MD;  Location: [facility] ENDOSCOPY;  Service: Endoscopy;  Laterality: N/A;   FOREIGN BODY REMOVAL  03/10/2021   Procedure: FOREIGN BODY REMOVAL;  Surgeon: Daryel November, MD;  Location: [facility] ENDOSCOPY;  Service: Gastroenterology;;   HERNIA REPAIR  2011   abdominal; post AVR   MRI  2/05   LVH global hypokin EF 48% Mod A.I.   VALVE REPLACEMENT  06/2009   St. Jude mechanical valve per Dr. Cyndia Bent     Current Outpatient Medications  Medication Sig Dispense Refill   ACCU-CHEK AVIVA PLUS test strip USE AS DIRECTED TO CHECK BLOOD SUGAR 2 TO 4 TIMES A DAY 100 each 11   Accu-Chek FastClix Lancets MISC USE AS DIRECTED TO CHECK BLOOD SUGAR TWICE DAILY 102 each 4   amoxicillin (AMOXIL) 500 MG tablet Take 4 tablets (2,000 mg total) by mouth daily as needed. Before dental visits. Dispense 5 courses 20 tablet 0   azelastine (ASTELIN) 0.1 % nasal spray Place 2 sprays into both nostrils 2 (two) times daily as needed for rhinitis. Use in each nostril as directed 30 mL 12   carvedilol (COREG) 6.25 MG tablet TAKE 1 TABLET BY MOUTH TWICE A DAY. KEEPOFFICE VISIT 180 tablet 3   cetirizine (ZYRTEC) 10 MG tablet Take 10 mg by mouth daily.     empagliflozin (JARDIANCE) 10 MG TABS tablet Take 1 tablet (10 mg total) by mouth daily before breakfast. 30 tablet 6   fluocinonide cream (LIDEX) 6.38 % APPLY 1 APPLICATION TO AFFECTEDE AREAS 2 TIMES DAILY AS NEEDED 30 g 1   furosemide (LASIX) 20 MG tablet TAKE 1 TABLET BY MOUTH ONCE DAILY 90 tablet 2   glipiZIDE (GLUCOTROL) 5 MG tablet TAKE 2 TABLETS BY MOUTH DAILY BEFORE BREAKFAST IF AM SUGAR IS ABOVE 150 180 tablet 1   Lancets Misc. (ACCU-CHEK FASTCLIX LANCET) KIT AS DIRECTED 1 kit 0   losartan (COZAAR) 100 MG tablet Take 1 tablet (100 mg total) by mouth daily. Keep scheduled appointment for further refills 30 tablet  0   metFORMIN (GLUCOPHAGE) 500 MG tablet Take 2 tablets (1,000 mg total) by mouth 2 (two) times daily. 360 tablet 3   omeprazole (PRILOSEC OTC) 20 MG tablet Take 1 tablet (20 mg total) by mouth daily. 90 tablet 3   potassium chloride (KLOR-CON) 10 MEQ tablet TAKE 1 TABLET BY MOUTH TWICE A DAY 180 tablet 3   pravastatin (PRAVACHOL) 20 MG tablet TAKE 1 TABLET BY MOUTH ONCE A DAY 90 tablet 1   psyllium (METAMUCIL) 58.6 % packet Take 1 packet by mouth daily.     tamsulosin (FLOMAX) 0.4 MG CAPS capsule Take 1 capsule (0.4 mg total) by mouth daily. 90 capsule 3   warfarin (COUMADIN) 1 MG tablet TAKE 1 TAB BY MOUTH DAILY, EXCEPT TAKE 2TABS ON FRIDAYS OR AS INSTRUCTED BY ANTICOAG CLINIC. (ALSO TAKE THE 3 MG TAB  AS INSTRUCTD) 120 tablet 2   warfarin (COUMADIN) 3 MG tablet TAKE 1 TABLET BY MOUTH ON SUNDAYS, TUESDAYS, WEDNESDAYS, THURSDAYS AND SATURDAYS OR AS DIRECTED BY ANTICOAGULATION CINIC 70 tablet 1   warfarin (COUMADIN) 3 MG tablet TAKE ONE 3 MG TABLET BY MOUTH DAILY AND ONE 1 MG TABLET BY MOUTH DAILY (4 MG TOTAL) EXCEPT TAKE TWO 1 MG TABLETS (2 MG TOTAL) ON FRIDAYS OR AS DIRECTED BY ANTICOAGULATION CLINIC. 90 tablet 1   No current facility-administered medications for this visit.    Allergies:   Ace inhibitors, Aspirin, Other, Primaxin [imipenem w/cilastatin sodium], and Tape     ROS:  Please see the history of present illness.   Otherwise, review of systems are positive for none.   All other systems are reviewed and negative.    PHYSICAL EXAM: VS:  BP (!) 143/80   Pulse (!) 54   Ht _0  (1.803 m)   Wt 243 lb (110.2 kg)   BMI 33.89 kg/m  , BMI Body mass index is 33.89 kg/m. GENERAL:  Well appearing NECK:  No jugular venous distention, waveform within normal limits, carotid upstroke brisk and symmetric, no bruits, no thyromegaly LUNGS:  Clear to auscultation bilaterally CHEST:  Well healed sternotomy scar. HEART:  PMI not displaced or sustained,S1 and S2 mechanical within normal limits,  no S3, no S4, no clicks, no rubs, no murmurs ABD:  Flat, positive bowel sounds normal in frequency in pitch, no bruits, no rebound, no guarding, no midline pulsatile mass, no hepatomegaly, no splenomegaly EXT:  2 plus pulses throughout, no edema, no cyanosis no clubbing  EKG:  EKG is ordered today. The ekg ordered today demonstrates left bundle branch block, left axis deviation, sinus rhythm, rate 54, no change from previous.   Recent Labs: 12/27/2021: ALT 32; BUN 11; Creatinine, Ser 1.05; Hemoglobin 14.8; Platelets 160.0; Potassium 4.7; Sodium 138; TSH 1.99    Lipid Panel    Component Value Date/Time   CHOL 148 12/27/2021 0810   TRIG 139.0 12/27/2021 0810   HDL 45.60 12/27/2021 0810   CHOLHDL 3 12/27/2021 0810   VLDL 27.8 12/27/2021 0810   LDLCALC 75 12/27/2021 0810   LDLDIRECT 97.0 12/21/2019 1517      Wt Readings from Last 3 Encounters:  01/17/22 243 lb (110.2 kg)  01/03/22 241 lb (109.3 kg)  01/02/22 241 lb (109.3 kg)      Other studies Reviewed: Additional studies/ records that were reviewed today include: Labs. Review of the above records demonstrates:  Please see elsewhere in the note.     ASSESSMENT AND PLAN:  AVR: He had normal function in January.  He understands endocarditis prophylaxis.  He is up-to-date with his anticoagulation.  No change in therapy.   CARDIOMYOPATHY:   He has a mildly reduced ejection fraction but it is unchanged from previous.  He has not tolerated ARB's or ACE inhibitor's in the past.  I am going to add Jardiance.  I will check a basic metabolic profile in about a month.  OBESITY: I have continued to encourage weight loss.   HTN: The blood pressure is upper limits of normal.  No change in therapy.  He has not tolerated med titration.  DM: His A1c is 8.4.  I will have him on Jardiance as above.   LBBB:   Has had no symptomatic bradycardia.  No change in therapy.  Current medicines are reviewed at length with the patient today.  The  patient does not have concerns regarding medicines.  The following changes have been made: As above  Labs/ tests ordered today include:   Orders Placed This Encounter  Procedures   Basic metabolic panel   Basic metabolic panel   EKG [id number]     Disposition:   FU with me in one year.    Signed, Minus Breeding, MD  01/17/2022 9:09 AM    [place]

## 2022-01-17 ENCOUNTER — Encounter: Payer: Self-pay | Admitting: Cardiology

## 2022-01-17 ENCOUNTER — Ambulatory Visit: Payer: Medicare PPO | Attending: Cardiology | Admitting: Cardiology

## 2022-01-17 VITALS — BP 143/80 | HR 54 | Ht 71.0 in | Wt 243.0 lb

## 2022-01-17 DIAGNOSIS — I10 Essential (primary) hypertension: Secondary | ICD-10-CM | POA: Diagnosis not present

## 2022-01-17 DIAGNOSIS — Z952 Presence of prosthetic heart valve: Secondary | ICD-10-CM | POA: Diagnosis not present

## 2022-01-17 DIAGNOSIS — E118 Type 2 diabetes mellitus with unspecified complications: Secondary | ICD-10-CM

## 2022-01-17 MED ORDER — EMPAGLIFLOZIN 10 MG PO TABS: 10.0000 mg | ORAL_TABLET | Freq: Every day | ORAL | 6 refills | Status: DC

## 2022-01-17 NOTE — Patient Instructions (Signed)
Medication Instructions:  Start Jardiance 10 mg every morning before breakfast Continue all other medications *If you need a refill on your cardiac medications before your next appointment, please call your pharmacy*   Lab Work: Bmet in 3 weeks  Lab order enclosed   Testing/Procedures: None ordered   Follow-Up: At [facility], you and your health needs are our priority.  As part of our continuing mission to provide you with exceptional heart care, we have created designated Provider Care Teams.  These Care Teams include your primary Cardiologist (physician) and Advanced Practice Providers (APPs -  Physician Assistants and Nurse Practitioners) who all work together to provide you with the care you need, when you need it.  We recommend signing up for the patient portal called "MyChart".  Sign up information is provided on this After Visit Summary.  MyChart is used to connect with patients for Virtual Visits (Telemedicine).  Patients are able to view lab/test results, encounter notes, upcoming appointments, etc.  Non-urgent messages can be sent to your provider as well.   To learn more about what you can do with MyChart, go to [email or web address].    Your next appointment:  12 months   Call in Sept to schedule Dec appointment     The format for your next appointment: Office    Provider:  Dr.Hochrein   Important Information About Sugar

## 2022-01-28 ENCOUNTER — Other Ambulatory Visit: Payer: Self-pay | Admitting: Cardiology

## 2022-02-07 DIAGNOSIS — I10 Essential (primary) hypertension: Secondary | ICD-10-CM | POA: Diagnosis not present

## 2022-02-07 DIAGNOSIS — E118 Type 2 diabetes mellitus with unspecified complications: Secondary | ICD-10-CM | POA: Diagnosis not present

## 2022-02-07 DIAGNOSIS — Z952 Presence of prosthetic heart valve: Secondary | ICD-10-CM | POA: Diagnosis not present

## 2022-02-07 LAB — BASIC METABOLIC PANEL
BUN/Creatinine Ratio: 14 (ref 10–24)
BUN: 15 mg/dL (ref 8–27)
CO2: 21 mmol/L (ref 20–29)
Calcium: 9.4 mg/dL (ref 8.6–10.2)
Chloride: 101 mmol/L (ref 96–106)
Creatinine, Ser: 1.06 mg/dL (ref 0.76–1.27)
Glucose: 125 mg/dL — ABNORMAL HIGH (ref 70–99)
Potassium: 4.5 mmol/L (ref 3.5–5.2)
Sodium: 139 mmol/L (ref 134–144)
eGFR: 76 mL/min/{1.73_m2} (ref >59)

## 2022-02-14 ENCOUNTER — Ambulatory Visit: Payer: Medicare PPO

## 2022-02-14 DIAGNOSIS — Z7901 Long term (current) use of anticoagulants: Secondary | ICD-10-CM

## 2022-02-14 LAB — POCT INR: INR: 3.7 — AB (ref 2.0–3.0)

## 2022-02-14 NOTE — Progress Notes (Signed)
Hold dose today and then change weekly dose to take 4 mg daily except take 2 mg on Mondays and Fridays. Recheck in 4 weeks.

## 2022-02-14 NOTE — Patient Instructions (Addendum)
Pre visit review using our clinic review tool, if applicable. No additional management support is needed unless otherwise documented below in the visit note.  Hold dose today and then change weekly dose to take 4 mg daily except take 2 mg on Mondays and Fridays. Recheck in 4 weeks.

## 2022-03-14 ENCOUNTER — Ambulatory Visit: Payer: Medicare PPO

## 2022-03-14 DIAGNOSIS — I4891 Unspecified atrial fibrillation: Secondary | ICD-10-CM | POA: Diagnosis not present

## 2022-03-14 DIAGNOSIS — Z7901 Long term (current) use of anticoagulants: Secondary | ICD-10-CM | POA: Diagnosis not present

## 2022-03-14 DIAGNOSIS — Z952 Presence of prosthetic heart valve: Secondary | ICD-10-CM

## 2022-03-14 LAB — POCT INR: INR: 2.5 (ref 2.0–3.0)

## 2022-03-14 NOTE — Patient Instructions (Addendum)
Pre visit review using our clinic review tool, if applicable. No additional management support is needed unless otherwise documented below in the visit note.  Continue 4 mg daily except take 2 mg on Mondays and Fridays. Recheck in 4 weeks.

## 2022-03-14 NOTE — Progress Notes (Signed)
Continue 4 mg daily except take 2 mg on Mondays and Fridays. Recheck in 4 weeks.

## 2022-04-04 ENCOUNTER — Encounter: Payer: Self-pay | Admitting: Family Medicine

## 2022-04-04 ENCOUNTER — Telehealth: Payer: Self-pay

## 2022-04-04 ENCOUNTER — Ambulatory Visit: Payer: Medicare PPO | Admitting: Family Medicine

## 2022-04-04 ENCOUNTER — Other Ambulatory Visit: Payer: Self-pay | Admitting: Radiology

## 2022-04-04 VITALS — BP 120/64 | HR 65 | Temp 97.3°F | Ht 71.0 in | Wt 237.0 lb

## 2022-04-04 DIAGNOSIS — E119 Type 2 diabetes mellitus without complications: Secondary | ICD-10-CM

## 2022-04-04 DIAGNOSIS — R300 Dysuria: Secondary | ICD-10-CM | POA: Diagnosis not present

## 2022-04-04 LAB — URINALYSIS, ROUTINE W REFLEX MICROSCOPIC
Bilirubin Urine: NEGATIVE
Hgb urine dipstick: NEGATIVE
Ketones, ur: NEGATIVE
Leukocytes,Ua: NEGATIVE
Nitrite: NEGATIVE
RBC / HPF: NONE SEEN (ref 0–?)
Specific Gravity, Urine: 1.02 (ref 1.000–1.030)
Total Protein, Urine: NEGATIVE
Urine Glucose: >=1000 — AB
Urobilinogen, UA: 0.2 (ref 0.0–1.0)
WBC, UA: NONE SEEN (ref 0–?)
pH: 6.5 (ref 5.0–8.0)

## 2022-04-04 LAB — POCT GLYCOSYLATED HEMOGLOBIN (HGB A1C): Hemoglobin A1C: 7.8 % — AB (ref 4.0–5.6)

## 2022-04-04 MED ORDER — AMOXICILLIN 500 MG PO TABS: 2000.0000 mg | ORAL_TABLET | Freq: Every day | ORAL | 0 refills | Status: DC | PRN

## 2022-04-04 NOTE — Telephone Encounter (Signed)
Spoke with patient and he made appointment.

## 2022-04-04 NOTE — Telephone Encounter (Signed)
Tried to call pt to verify appt time but unable to reach pt or pts wife. Sending note to [facility]sc support.

## 2022-04-04 NOTE — Telephone Encounter (Signed)
[employer] Night - Client Nonclinical Telephone Record  [facility] Client [name] [name] Night - Client Client Site [place] - Night Provider Elsie Stain "Brigitte Pulse MD Contact Type Call Who Is Calling Patient / Member / Family / Caregiver Caller Name [name] Caller Phone Number [phone] Patient Name [name] [name] Patient DOB 1950/12/17 Call Type Message Only Information Provided Reason for Call Request for General Office Information Initial Comment Caller states he needs to see if he has an appt at 845 this morning. Additional Comment Office hours provided. Disp. Time Disposition Final User 04/04/2022 7:56:45 AM General Information Provided Yes Gaspar Skeeters Call Closed By: Gaspar Skeeters Transaction Date/Time: 04/04/2022 7:55:00 AM (ET

## 2022-04-04 NOTE — Progress Notes (Signed)
Diabetes:  Using medications without difficulties: see below.   Hypoglycemic episodes:no Hyperglycemic episodes:no Feet problems: no Blood Sugars averaging: 150-160 eye exam within last year: yes A1c 8.4---->7.8.  d/w pt at OV.   Diet and exercise d/w pt.  D/w pt about snacking- potato chips.  Discussed substitutions.  He is working outside more.   Some mild discomfort with urination.  Started after taking jardiance.  D/w pt about checking urine, sent with container- he didn't have to urinate at the [place].   Amoxil rx printed to hold, for dental visits.   Meds, vitals, and allergies reviewed.   ROS: Per HPI unless specifically indicated in ROS section   GEN: nad, alert and oriented HEENT: ncat NECK: supple w/o LA CV: sounds like rrr with occ ectopy noted PULM: ctab, no inc wob ABD: soft, +bs EXT: no edema SKIN: well perfused.

## 2022-04-04 NOTE — Patient Instructions (Addendum)
Recheck in about 4 months, A1c at the visit like today.  Take care.  Glad to see you. Urine sample when possible.

## 2022-04-05 LAB — URINE CULTURE
MICRO NUMBER:: 14662699
SPECIMEN QUALITY:: ADEQUATE

## 2022-04-06 NOTE — Assessment & Plan Note (Signed)
A1c 8.4---->7.8.  d/w pt at OV.   Diet and exercise d/w pt.  D/w pt about snacking- potato chips.  Discussed substitutions.  He is working outside more.   Some mild discomfort with urination.  Started after taking jardiance.  D/w pt about checking urine, sent with container- he didn't have to urinate at the [place]. See u/a, when resulted.  Recheck in about 4 months, A1c at the visit like today.  Continue jardiance, glipizide, metformin.

## 2022-04-08 ENCOUNTER — Other Ambulatory Visit: Payer: Self-pay | Admitting: Cardiology

## 2022-04-08 ENCOUNTER — Other Ambulatory Visit: Payer: Self-pay | Admitting: Family Medicine

## 2022-04-11 ENCOUNTER — Ambulatory Visit: Payer: Medicare PPO

## 2022-04-11 DIAGNOSIS — Z7901 Long term (current) use of anticoagulants: Secondary | ICD-10-CM | POA: Diagnosis not present

## 2022-04-11 LAB — POCT INR: INR: 2.5 (ref 2.0–3.0)

## 2022-04-11 NOTE — Patient Instructions (Addendum)
Pre visit review using our clinic review tool, if applicable. No additional management support is needed unless otherwise documented below in the visit note.  Continue 4 mg daily except take 2 mg on Mondays and Fridays. Recheck in 6 weeks.

## 2022-04-11 NOTE — Progress Notes (Signed)
Continue 4 mg daily except take 2 mg on Mondays and Fridays. Recheck in 6 weeks.

## 2022-04-13 ENCOUNTER — Other Ambulatory Visit: Payer: Self-pay | Admitting: Family Medicine

## 2022-05-14 ENCOUNTER — Other Ambulatory Visit: Payer: Self-pay | Admitting: Family Medicine

## 2022-05-23 ENCOUNTER — Ambulatory Visit: Payer: Medicare PPO

## 2022-05-23 DIAGNOSIS — Z7901 Long term (current) use of anticoagulants: Secondary | ICD-10-CM

## 2022-05-23 LAB — POCT INR: INR: 2 (ref 2.0–3.0)

## 2022-05-23 NOTE — Patient Instructions (Addendum)
Pre visit review using our clinic review tool, if applicable. No additional management support is needed unless otherwise documented below in the visit note.  Continue 4 mg daily except take 2 mg on Mondays and Fridays. Recheck in 6 weeks.  

## 2022-05-23 NOTE — Progress Notes (Signed)
Continue 4 mg daily except take 2 mg on Mondays and Fridays. Recheck in 6 weeks.  

## 2022-05-27 ENCOUNTER — Other Ambulatory Visit: Payer: Self-pay | Admitting: Family Medicine

## 2022-06-17 ENCOUNTER — Other Ambulatory Visit: Payer: Self-pay | Admitting: Family Medicine

## 2022-06-17 DIAGNOSIS — Z7901 Long term (current) use of anticoagulants: Secondary | ICD-10-CM

## 2022-06-18 NOTE — Telephone Encounter (Signed)
Pt is compliant with warfarin management and PCP apts.  Sent in refill of warfarin to requested pharmacy.      

## 2022-07-04 ENCOUNTER — Ambulatory Visit: Payer: Medicare PPO

## 2022-07-04 DIAGNOSIS — Z7901 Long term (current) use of anticoagulants: Secondary | ICD-10-CM | POA: Diagnosis not present

## 2022-07-04 LAB — POCT INR: INR: 3 (ref 2.0–3.0)

## 2022-07-04 NOTE — Patient Instructions (Addendum)
Pre visit review using our clinic review tool, if applicable. No additional management support is needed unless otherwise documented below in the visit note.  Continue 4 mg daily except take 2 mg on Mondays and Fridays. Recheck in 6 weeks.  

## 2022-07-04 NOTE — Progress Notes (Signed)
Continue 4 mg daily except take 2 mg on Mondays and Fridays. Recheck in 6 weeks.  

## 2022-07-22 ENCOUNTER — Inpatient Hospital Stay
Admission: EM | Admit: 2022-07-22 | Discharge: 2022-07-25 | DRG: 308 | Disposition: A | Discharge status: Home / Self Care | Payer: Medicare PPO | Attending: Internal Medicine | Admitting: Internal Medicine

## 2022-07-22 ENCOUNTER — Emergency Department: Payer: Medicare PPO

## 2022-07-22 ENCOUNTER — Telehealth: Payer: Self-pay | Admitting: Family Medicine

## 2022-07-22 ENCOUNTER — Other Ambulatory Visit: Payer: Self-pay

## 2022-07-22 DIAGNOSIS — I484 Atypical atrial flutter: Secondary | ICD-10-CM | POA: Diagnosis not present

## 2022-07-22 DIAGNOSIS — I2489 Other forms of acute ischemic heart disease: Secondary | ICD-10-CM | POA: Diagnosis present

## 2022-07-22 DIAGNOSIS — I10 Essential (primary) hypertension: Secondary | ICD-10-CM | POA: Diagnosis not present

## 2022-07-22 DIAGNOSIS — G4730 Sleep apnea, unspecified: Secondary | ICD-10-CM | POA: Diagnosis present

## 2022-07-22 DIAGNOSIS — I5023 Acute on chronic systolic (congestive) heart failure: Secondary | ICD-10-CM | POA: Diagnosis not present

## 2022-07-22 DIAGNOSIS — K219 Gastro-esophageal reflux disease without esophagitis: Secondary | ICD-10-CM | POA: Diagnosis present

## 2022-07-22 DIAGNOSIS — I110 Hypertensive heart disease with heart failure: Secondary | ICD-10-CM | POA: Diagnosis not present

## 2022-07-22 DIAGNOSIS — T80818A Extravasation of other vesicant agent, initial encounter: Secondary | ICD-10-CM | POA: Diagnosis not present

## 2022-07-22 DIAGNOSIS — Z7901 Long term (current) use of anticoagulants: Secondary | ICD-10-CM | POA: Diagnosis not present

## 2022-07-22 DIAGNOSIS — E669 Obesity, unspecified: Secondary | ICD-10-CM | POA: Diagnosis present

## 2022-07-22 DIAGNOSIS — I509 Heart failure, unspecified: Secondary | ICD-10-CM | POA: Diagnosis not present

## 2022-07-22 DIAGNOSIS — I4891 Unspecified atrial fibrillation: Secondary | ICD-10-CM | POA: Diagnosis not present

## 2022-07-22 DIAGNOSIS — Z7984 Long term (current) use of oral hypoglycemic drugs: Secondary | ICD-10-CM | POA: Diagnosis not present

## 2022-07-22 DIAGNOSIS — I480 Paroxysmal atrial fibrillation: Secondary | ICD-10-CM | POA: Diagnosis present

## 2022-07-22 DIAGNOSIS — R000 Tachycardia, unspecified: Secondary | ICD-10-CM | POA: Diagnosis not present

## 2022-07-22 DIAGNOSIS — Y848 Other medical procedures as the cause of abnormal reaction of the patient, or of later complication, without mention of misadventure at the time of the procedure: Secondary | ICD-10-CM | POA: Diagnosis not present

## 2022-07-22 DIAGNOSIS — Z888 Allergy status to other drugs, medicaments and biological substances status: Secondary | ICD-10-CM

## 2022-07-22 DIAGNOSIS — E7800 Pure hypercholesterolemia, unspecified: Secondary | ICD-10-CM | POA: Diagnosis present

## 2022-07-22 DIAGNOSIS — Z886 Allergy status to analgesic agent status: Secondary | ICD-10-CM

## 2022-07-22 DIAGNOSIS — Z808 Family history of malignant neoplasm of other organs or systems: Secondary | ICD-10-CM

## 2022-07-22 DIAGNOSIS — I447 Left bundle-branch block, unspecified: Secondary | ICD-10-CM | POA: Diagnosis present

## 2022-07-22 DIAGNOSIS — R072 Precordial pain: Secondary | ICD-10-CM | POA: Diagnosis not present

## 2022-07-22 DIAGNOSIS — Z91048 Other nonmedicinal substance allergy status: Secondary | ICD-10-CM

## 2022-07-22 DIAGNOSIS — Z6832 Body mass index (BMI) 32.0-32.9, adult: Secondary | ICD-10-CM | POA: Diagnosis not present

## 2022-07-22 DIAGNOSIS — E119 Type 2 diabetes mellitus without complications: Secondary | ICD-10-CM | POA: Diagnosis not present

## 2022-07-22 DIAGNOSIS — G4733 Obstructive sleep apnea (adult) (pediatric): Secondary | ICD-10-CM | POA: Diagnosis not present

## 2022-07-22 DIAGNOSIS — Z8711 Personal history of peptic ulcer disease: Secondary | ICD-10-CM | POA: Diagnosis not present

## 2022-07-22 DIAGNOSIS — I420 Dilated cardiomyopathy: Secondary | ICD-10-CM | POA: Diagnosis not present

## 2022-07-22 DIAGNOSIS — Z800 Family history of malignant neoplasm of digestive organs: Secondary | ICD-10-CM

## 2022-07-22 DIAGNOSIS — Z91199 Patient's noncompliance with other medical treatment and regimen due to unspecified reason: Secondary | ICD-10-CM

## 2022-07-22 DIAGNOSIS — Z807 Family history of other malignant neoplasms of lymphoid, hematopoietic and related tissues: Secondary | ICD-10-CM

## 2022-07-22 DIAGNOSIS — R002 Palpitations: Secondary | ICD-10-CM | POA: Diagnosis present

## 2022-07-22 DIAGNOSIS — I7121 Aneurysm of the ascending aorta, without rupture: Secondary | ICD-10-CM | POA: Diagnosis not present

## 2022-07-22 DIAGNOSIS — Z952 Presence of prosthetic heart valve: Secondary | ICD-10-CM | POA: Diagnosis not present

## 2022-07-22 DIAGNOSIS — Z8249 Family history of ischemic heart disease and other diseases of the circulatory system: Secondary | ICD-10-CM

## 2022-07-22 DIAGNOSIS — Z811 Family history of alcohol abuse and dependence: Secondary | ICD-10-CM

## 2022-07-22 DIAGNOSIS — I428 Other cardiomyopathies: Secondary | ICD-10-CM | POA: Diagnosis present

## 2022-07-22 DIAGNOSIS — I5021 Acute systolic (congestive) heart failure: Secondary | ICD-10-CM | POA: Diagnosis not present

## 2022-07-22 DIAGNOSIS — H524 Presbyopia: Secondary | ICD-10-CM | POA: Diagnosis not present

## 2022-07-22 DIAGNOSIS — R918 Other nonspecific abnormal finding of lung field: Secondary | ICD-10-CM | POA: Diagnosis not present

## 2022-07-22 DIAGNOSIS — R079 Chest pain, unspecified: Secondary | ICD-10-CM | POA: Diagnosis present

## 2022-07-22 DIAGNOSIS — R0789 Other chest pain: Secondary | ICD-10-CM | POA: Diagnosis not present

## 2022-07-22 DIAGNOSIS — I4819 Other persistent atrial fibrillation: Secondary | ICD-10-CM | POA: Diagnosis present

## 2022-07-22 DIAGNOSIS — J90 Pleural effusion, not elsewhere classified: Secondary | ICD-10-CM | POA: Diagnosis not present

## 2022-07-22 DIAGNOSIS — I4892 Unspecified atrial flutter: Secondary | ICD-10-CM | POA: Diagnosis not present

## 2022-07-22 DIAGNOSIS — Z79899 Other long term (current) drug therapy: Secondary | ICD-10-CM

## 2022-07-22 DIAGNOSIS — H5213 Myopia, bilateral: Secondary | ICD-10-CM | POA: Diagnosis not present

## 2022-07-22 DIAGNOSIS — D649 Anemia, unspecified: Secondary | ICD-10-CM | POA: Diagnosis present

## 2022-07-22 LAB — TROPONIN I (HIGH SENSITIVITY)
Troponin I (High Sensitivity): 71 ng/L — ABNORMAL HIGH (ref <18)
Troponin I (High Sensitivity): 70 ng/L — ABNORMAL HIGH (ref <18)

## 2022-07-22 LAB — PROTIME-INR
INR: 2.8 — ABNORMAL HIGH (ref 0.8–1.2)
Prothrombin Time: 29.6 seconds — ABNORMAL HIGH (ref 11.4–15.2)

## 2022-07-22 LAB — COMPREHENSIVE METABOLIC PANEL
ALT: 22 U/L (ref 0–44)
AST: 28 U/L (ref 15–41)
Albumin: 3.9 g/dL (ref 3.5–5.0)
Alkaline Phosphatase: 78 U/L (ref 38–126)
Anion gap: 8 (ref 5–15)
BUN: 16 mg/dL (ref 8–23)
CO2: 24 mmol/L (ref 22–32)
Calcium: 9 mg/dL (ref 8.9–10.3)
Chloride: 106 mmol/L (ref 98–111)
Creatinine, Ser: 1.22 mg/dL (ref 0.61–1.24)
GFR, Estimated: >60 mL/min (ref >60)
Glucose, Bld: 213 mg/dL — ABNORMAL HIGH (ref 70–99)
Potassium: 4.6 mmol/L (ref 3.5–5.1)
Sodium: 138 mmol/L (ref 135–145)
Total Bilirubin: 1.1 mg/dL (ref 0.3–1.2)
Total Protein: 6.9 g/dL (ref 6.5–8.1)

## 2022-07-22 LAB — T4, FREE: Free T4: 1.16 ng/dL — ABNORMAL HIGH (ref 0.61–1.12)

## 2022-07-22 LAB — CBC
HCT: 43.9 % (ref 39.0–52.0)
Hemoglobin: 14.9 g/dL (ref 13.0–17.0)
MCH: 31.2 pg (ref 26.0–34.0)
MCHC: 33.9 g/dL (ref 30.0–36.0)
MCV: 92 fL (ref 80.0–100.0)
Platelets: 149 10*3/uL — ABNORMAL LOW (ref 150–400)
RBC: 4.77 MIL/uL (ref 4.22–5.81)
RDW: 14.9 % (ref 11.5–15.5)
WBC: 7.7 10*3/uL (ref 4.0–10.5)
nRBC: 0 % (ref 0.0–0.2)

## 2022-07-22 LAB — TYPE AND SCREEN

## 2022-07-22 LAB — MAGNESIUM: Magnesium: 2.1 mg/dL (ref 1.7–2.4)

## 2022-07-22 LAB — HM DIABETES EYE EXAM

## 2022-07-22 LAB — BRAIN NATRIURETIC PEPTIDE: B Natriuretic Peptide: 244.9 pg/mL — ABNORMAL HIGH (ref 0.0–100.0)

## 2022-07-22 LAB — TSH: TSH: 0.981 u[IU]/mL (ref 0.350–4.500)

## 2022-07-22 MED ORDER — AMIODARONE HCL IN DEXTROSE 360-4.14 MG/200ML-% IV SOLN
60.0000 mg/h | INTRAVENOUS | Status: DC
  Administered 2022-07-22 (×2): 60 mg/h via INTRAVENOUS
  Filled 2022-07-22 (×2): qty 200

## 2022-07-22 MED ORDER — MORPHINE SULFATE (PF) 2 MG/ML IV SOLN: 1.0000 mg | INTRAVENOUS | Status: DC | PRN

## 2022-07-22 MED ORDER — WARFARIN - PHARMACIST DOSING INPATIENT
Freq: Every day | Status: DC
  Filled 2022-07-22: qty 1

## 2022-07-22 MED ORDER — HYDRALAZINE HCL 20 MG/ML IJ SOLN: 5.0000 mg | INTRAMUSCULAR | Status: DC | PRN

## 2022-07-22 MED ORDER — SODIUM CHLORIDE 0.9% FLUSH
3.0000 mL | Freq: Two times a day (BID) | INTRAVENOUS | Status: DC
  Administered 2022-07-22 – 2022-07-24 (×5): 3 mL via INTRAVENOUS

## 2022-07-22 MED ORDER — WARFARIN SODIUM 2 MG PO TABS
2.0000 mg | ORAL_TABLET | Freq: Once | ORAL | Status: AC
  Administered 2022-07-22: 2 mg via ORAL
  Filled 2022-07-22: qty 1

## 2022-07-22 MED ORDER — ACETAMINOPHEN 325 MG PO TABS: 650.0000 mg | ORAL_TABLET | Freq: Four times a day (QID) | ORAL | Status: DC | PRN

## 2022-07-22 MED ORDER — ONDANSETRON HCL 4 MG/2ML IJ SOLN: 4.0000 mg | Freq: Four times a day (QID) | INTRAMUSCULAR | Status: DC | PRN

## 2022-07-22 MED ORDER — PANTOPRAZOLE SODIUM 40 MG IV SOLR
40.0000 mg | Freq: Two times a day (BID) | INTRAVENOUS | Status: DC
  Administered 2022-07-22 – 2022-07-25 (×6): 40 mg via INTRAVENOUS
  Filled 2022-07-22 (×6): qty 10

## 2022-07-22 MED ORDER — SODIUM CHLORIDE 0.9 % IV SOLN: 250.0000 mL | INTRAVENOUS | Status: DC | PRN

## 2022-07-22 MED ORDER — INSULIN ASPART 100 UNIT/ML IJ SOLN
0.0000 [IU] | Freq: Three times a day (TID) | INTRAMUSCULAR | Status: DC
  Administered 2022-07-23 (×2): 2 [IU] via SUBCUTANEOUS
  Administered 2022-07-23: 5 [IU] via SUBCUTANEOUS
  Administered 2022-07-24 (×2): 3 [IU] via SUBCUTANEOUS
  Administered 2022-07-25: 5 [IU] via SUBCUTANEOUS
  Administered 2022-07-25: 3 [IU] via SUBCUTANEOUS
  Filled 2022-07-22 (×7): qty 1

## 2022-07-22 MED ORDER — NITROGLYCERIN 0.4 MG SL SUBL: 0.4000 mg | SUBLINGUAL_TABLET | SUBLINGUAL | Status: DC | PRN

## 2022-07-22 MED ORDER — ACETAMINOPHEN 650 MG RE SUPP: 650.0000 mg | Freq: Four times a day (QID) | RECTAL | Status: DC | PRN

## 2022-07-22 MED ORDER — SODIUM CHLORIDE 0.9% FLUSH: 3.0000 mL | INTRAVENOUS | Status: DC | PRN

## 2022-07-22 MED ORDER — AMIODARONE LOAD VIA INFUSION
150.0000 mg | Freq: Once | INTRAVENOUS | Status: AC
  Administered 2022-07-22: 150 mg via INTRAVENOUS
  Filled 2022-07-22: qty 83.34

## 2022-07-22 MED ORDER — AMIODARONE HCL IN DEXTROSE 360-4.14 MG/200ML-% IV SOLN
30.0000 mg/h | INTRAVENOUS | Status: DC
  Administered 2022-07-22 – 2022-07-25 (×5): 30 mg/h via INTRAVENOUS
  Filled 2022-07-22 (×5): qty 200

## 2022-07-22 MED ORDER — CARVEDILOL 3.125 MG PO TABS
3.1250 mg | ORAL_TABLET | Freq: Two times a day (BID) | ORAL | Status: DC
  Administered 2022-07-23 – 2022-07-25 (×5): 3.125 mg via ORAL
  Filled 2022-07-22 (×5): qty 1

## 2022-07-22 MED ORDER — ONDANSETRON HCL 4 MG PO TABS: 4.0000 mg | ORAL_TABLET | Freq: Four times a day (QID) | ORAL | Status: DC | PRN

## 2022-07-22 MED ORDER — DILTIAZEM HCL 25 MG/5ML IV SOLN
20.0000 mg | Freq: Once | INTRAVENOUS | Status: AC
  Administered 2022-07-22: 20 mg via INTRAVENOUS
  Filled 2022-07-22: qty 5

## 2022-07-22 MED ORDER — DILTIAZEM HCL-DEXTROSE 125-5 MG/125ML-% IV SOLN (PREMIX): 5.0000 mg/h | INTRAVENOUS | Status: DC

## 2022-07-22 NOTE — Assessment & Plan Note (Signed)
Advised pt to obtain oral appliance or find a treatment plan and d/w PCP.

## 2022-07-22 NOTE — Assessment & Plan Note (Signed)
Pharmacy consult for coumadin for mechanical valve and a.fib .

## 2022-07-22 NOTE — ED Triage Notes (Signed)
Pt comes with c/o tightness in chest. Pt states hx afib. Pt is on thinner. Pt states Hr was racing.  HR-133 on EKG

## 2022-07-22 NOTE — Consult Note (Signed)
ANTICOAGULATION CONSULT NOTE - Initial Consult  Pharmacy Consult for warfarin Indication: AVR and Afib  Allergies  Allergen Reactions   Ace Inhibitors     REACTION: cough   Aspirin Other (See Comments)    Gastritis.    Other     dexcom patch- local rash- presumed adhesive allergy.     Primaxin [Imipenem W/Cilastatin Sodium]     Rash- but able to take amoxil w/o troubles.    Tape     Rash from bandaids.      Patient Measurements: Height: 5\' 11"  (180.3 cm) Weight: 106.1 kg (234 lb) IBW/kg (Calculated) : 75.3   Vital Signs: Temp: 98.5 F (36.9 C) (06/24 1550) Temp Source: Oral (06/24 1550) BP: 109/66 (06/24 2030) Pulse Rate: 113 (06/24 2030)  Labs: Recent Labs    07/22/22 1555 07/22/22 1755 07/22/22 1836  HGB 14.9  --   --   HCT 43.9  --   --   PLT 149*  --   --   LABPROT  --   --  29.6*  INR  --   --  2.8*  CREATININE 1.22  --   --   TROPONINIHS 71* 70*  --     Estimated Creatinine Clearance: 69.8 mL/min (by C-G formula based on SCr of 1.22 mg/dL).   Medical History: Past Medical History:  Diagnosis Date   Anemia    Aortic insufficiency    with bicuspid aortic valve (the last MRI demonstrated the aortic root to be 4.8 x 4.7 cm.) Mod regurgitation. Normal chamber size with very mild left ventricular dysfunction.)   Blood transfusion    CHF (congestive heart failure) (HCC)    Clotting disorder (HCC)    Cough 06/28/2009   Dr. 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   Pericardial effusion 07/2009   admission to [facility], s/p pericardial window   Pleural effusion 10/2009   s/p thoracentesis   PNA  (pneumonia) 10/2009   admnission to [facility], with right pleural effusion, s/p thoracentesis   Sleep apnea    Syncope 08/2009   admission for fever and syncope, no source for fever seen on cultures. Syncope thought to be r/t orthostasis    Medications:  Per Anti-coag clinic visit note from 07/04/22 patient has had an aortic valve replacement and Afib.  INR goal of 2-3.  Dose at that visit was Warfarin 2 mg Mon/Fri and 4 mg AOD.  Confirmed with ED nurse that patient has not had dose today 07/22/22.  Assessment: 72 yo male with PMH including AVR and Afib. Patient presented to ED with palpitations and chest pain.  Patient found to be in Afib.  Pharmacy consulted for warfarin resumption and dosing.  Interacting medications: Amiodarone  Goal of Therapy:  INR 2-3 Monitor platelets by anticoagulation protocol: Yes   Date INR Warfarin Dose  6/24 2.8 2 mg         Plan:  INR is therapeutic at 2.8 Will give Warfarin 2 mg po x 1 tonight  Daily INRs while on inpatient  Barrie Folk, PharmD 07/22/2022,9:41 PM

## 2022-07-22 NOTE — Assessment & Plan Note (Signed)
Essential hypertension: Blood pressures have been in the 1 teens.  Patient said that_syncope and dizziness due to low blood pressure with blood pressure medications Jardiance and intermittent diuretic therapy as needed. Will defer to PCP to adjust her blood pressure medication regimen.  Patient is stable.  Of any dizziness or syncopal symptoms.

## 2022-07-22 NOTE — Assessment & Plan Note (Addendum)
Pt presenting with Palpitations/A-fib RVR Started on amiodarone infusion-rate improved with remain in low 100s.  Switching between atrial flutter and fibrillation. -Continue with amiodarone infusion -Going for cardioversion tomorrow -Continue with Coumadin

## 2022-07-22 NOTE — Telephone Encounter (Signed)
FYI: This call has been transferred to Access Nurse. Once the result note has been entered staff can address the message at that time.  Patient called in with the following symptoms:  Red Word: Heart racing 130 BPM, some SOB    Please advise at Mobile [phone] (mobile)  Message is routed to Provider Pool and [facility] Triage

## 2022-07-22 NOTE — Assessment & Plan Note (Addendum)
Substernal Chest pain presentation.  Mild troponin elevation secondary to demand ischemia with increased heart rate and A-fib RVR. Cardiology consulted. No st t wave changes.  .  Nitroglycerin, as needed morphine, IV PPI therapy. -Echocardiogram done-pending results

## 2022-07-22 NOTE — H&P (Addendum)
History and Physical    Patient: [name] [name] XBJ:[id number] DOB: 12-11-1950 DOA: 07/22/2022 DOS: the patient was seen and examined on 07/22/2022 PCP: Joaquim Nam, MD  Patient coming from: Home  Chief Complaint:  Chief Complaint  Patient presents with   Chest Pain    HPI: [name] [name] is a 72 y.o. male with medical history significant for aortic insufficiency, bicuspid aortic valve status post [facility] Jude mechanical valve on Coumadin and need for endocarditis prophylaxis, pt sees [facility] cardiology- 2011, diabetes mellitus type 2, anemia with a history of duodenal ulcer and GI bleed, hypertension, necrotizing pancreatitis, pericardial effusion, pneumonia, sleep apnea, hypertension, obesity coming to the hospital for palpitations and chest discomfort.  Pt lives with wife. Pt gives history. Says his HR was racing and was 135 BPM by pulse oximetry.  Pt called PCP and was told to come to ED.intermittent but today had chest pressure.symptoms started at rest only intermittently over the past week or so.[name]  Pt is on coumadin for a.fib.   In the emergency room patient is alert awake oriented and gives history.  Friends at bedside.  Patient has sleep apnea and does not use his CPAP machine.  Discussed with him about oral appliance. Sugars  are moderately controlled, patient reports dietary indiscretion with sodium thinks he may have had some chips.  Initial vitals show heart rate of 134 blood pressure 138/80 O2 sats of 94%.  EKG showed A-fib RVR with a left bundle branch block. In the emergency room patient started on amiodarone drip.  Heart rate has improved 130s to 113.  CMP shows glucose of 213 otherwise within normal limit.Magnesium ordered and pending.  BNP of 244.9 troponin of 71 and repeat of 70. CBC is within normal limits except for platelet count of 149 patient has had intermittent thrombocytopenia in the past as well.  INR today is 2.8.  Patient's last A1c was 7.8 and last echo in  01/2021 shows Red EF CHF at 45-50%.  Review of Systems: Review of Systems  Cardiovascular:  Positive for chest pain and palpitations.  All other systems reviewed and are negative.  Past Medical History:  Diagnosis Date   Anemia    Aortic insufficiency    with bicuspid aortic valve (the last MRI demonstrated the aortic root to be 4.8 x 4.7 cm.) Mod regurgitation. Normal chamber size with very mild left ventricular dysfunction.)   Blood transfusion    CHF (congestive heart failure) (HCC)    Clotting disorder (HCC)    Cough 06/28/2009   Dr. 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   Pericardial effusion 07/2009   admission to [facility], s/p pericardial window   Pleural effusion 10/2009   s/p thoracentesis   PNA (pneumonia) 10/2009   admnission to [facility], with right pleural effusion, s/p thoracentesis   Sleep apnea    Syncope 08/2009   admission for fever and syncope, no source for fever seen on cultures. Syncope thought to be r/t orthostasis   Past Surgical History:  Procedure Laterality Date   CARDIAC VALVE REPLACEMENT  07/10/2009   AVR  Cardiolite EKG  03/10/2003   (-) ? small/apical infarct EF 50%   DOPPLER ECHOCARDIOGRAPHY  01/18/97   LVH, mild dilated aorta - root mod to severe aortic regurg EF 50-55%   DOPPLER ECHOCARDIOGRAPHY  09/27/03   No changes, moderate severe aortic regurg   DOPPLER ECHOCARDIOGRAPHY  07/09/2004   Mod severe aortic regurg 2-3+ T.R., T.I.R., mild P..R.   DOPPLER ECHOCARDIOGRAPHY  04/01/06   Hypokin Apex EF 55%   DOPPLER ECHOCARDIOGRAPHY  02/15/2010   Mild LVH, mild decr Sys fctn EF 40-45% Mild Dias  Dysfctn Mech AV Triv AR, MR   ESOPHAGOGASTRODUODENOSCOPY  1990, 1999   Hot dog in throat (1987)/ Chicken in throat (1990)/ 01/06/97 (Dr. Kinnie Scales) Impaction, dilatation, Schatzki's Ring/ 07/1998 repeat Dilatation/ 01/10/01 stricture/dilated/ 03/08/05 Schatzki's Ring, dilated/ 06/01/08 Food Disimpaction [name] Bulb Ulcer (Dr. Ewing Schlein)   ESOPHAGOGASTRODUODENOSCOPY  01/06/1997   (Dr. Kinnie Scales) Impaction, dilatation, Schatzki's Ring   ESOPHAGOGASTRODUODENOSCOPY  08/09/98   Repeat dilatation [name])   ESOPHAGOGASTRODUODENOSCOPY  01/13/2001   stricture/ HH, duodenal ulcer / bleeding Marina Goodell)   ESOPHAGOGASTRODUODENOSCOPY  12/25/01   esophagus stricture, dilated   ESOPHAGOGASTRODUODENOSCOPY  03/23/2001   stricture, dilated   ESOPHAGOGASTRODUODENOSCOPY  03/08/05   Schatzki's Ring   03/13/05  Schatzki's Ring - dilated, duodenitis   ESOPHAGOGASTRODUODENOSCOPY  06/11/08   with food disimpaction.  Food impaction Ms HH  Sm bulb ulcer (Dr. Ewing Schlein)   ESOPHAGOGASTRODUODENOSCOPY     stricture/HH, duodenal ulcer/bleeding Marina Goodell)   ESOPHAGOGASTRODUODENOSCOPY N/A 01/23/2016   Procedure: ESOPHAGOGASTRODUODENOSCOPY (EGD);  Surgeon: Midge Minium, MD;  Location: [facility] ENDOSCOPY;  Service: Endoscopy;  Laterality: N/A;   ESOPHAGOGASTRODUODENOSCOPY N/A 03/10/2021   Procedure: ESOPHAGOGASTRODUODENOSCOPY (EGD);  Surgeon: Jenel Lucks, MD;  Location: [facility] ENDOSCOPY;  Service: Gastroenterology;  Laterality: N/A;   ESOPHAGOGASTRODUODENOSCOPY (EGD) WITH PROPOFOL N/A 02/06/2016   Procedure: ESOPHAGOGASTRODUODENOSCOPY (EGD) WITH PROPOFOL;  Surgeon: Midge Minium, MD;  Location: [facility] ENDOSCOPY;  Service: Endoscopy;  Laterality: N/A;   FOREIGN BODY REMOVAL  03/10/2021   Procedure: FOREIGN BODY REMOVAL;  Surgeon: Jenel Lucks, MD;  Location: [facility] ENDOSCOPY;  Service: Gastroenterology;;   HERNIA REPAIR  2011   abdominal; post AVR   MRI  2/05   LVH global hypokin EF 48% Mod A.I.   VALVE REPLACEMENT  06/2009   St. Jude mechanical valve per  Dr. Laneta Simmers   Social History:  reports that he has never smoked. He has never used smokeless tobacco. He reports that he does not drink alcohol and does not use drugs.  Allergies  Allergen Reactions   Ace Inhibitors     REACTION: cough   Aspirin Other (See Comments)    Gastritis.    Other     dexcom patch- local rash- presumed adhesive allergy.     Primaxin [Imipenem W/Cilastatin Sodium]     Rash- but able to take amoxil w/o troubles.    Tape     Rash from bandaids.      Family History  Problem Relation Age of Onset   Obesity Mother        bedridden   Hypertension Mother    Heart disease Father        CABG, CAD   Hypertension Father    Lymphoma Father    Cancer Father        lymphoma   Alcohol abuse Brother    Cancer Brother        melanoma   Colon cancer Paternal Aunt    Stomach cancer Neg Hx    Rectal  cancer Neg Hx    Prostate cancer Neg Hx     Prior to Admission medications   Medication Sig Start Date End Date Taking? Authorizing Provider  ACCU-CHEK AVIVA PLUS test strip USE AS DIRECTED TO CHECK BLOOD SUGAR 2 TO 4 TIMES A DAY 09/29/20   Joaquim Nam, MD  Accu-Chek FastClix Lancets MISC USE AS DIRECTED TO CHECK BLOOD SUGAR TWICE DAILY 09/29/20   Joaquim Nam, MD  amoxicillin (AMOXIL) 500 MG tablet Take 4 tablets (2,000 mg total) by mouth daily as needed. Before dental visits. Dispense 5 courses 04/04/22   Joaquim Nam, MD  azelastine (ASTELIN) 0.1 % nasal spray Place 2 sprays into both nostrils 2 (two) times daily as needed for rhinitis. Use in each nostril as directed 09/29/20   Joaquim Nam, MD  carvedilol (COREG) 6.25 MG tablet TAKE ONE TABLET BY MOUTH TWICE A DAY. KEEP OFFICE VISIT 04/08/22   Rollene Rotunda, MD  cetirizine (ZYRTEC) 10 MG tablet Take 10 mg by mouth daily.    [provider]  empagliflozin (JARDIANCE) 10 MG TABS tablet Take 1 tablet (10 mg total) by mouth daily before breakfast. 01/17/22   Rollene Rotunda, MD  fluocinonide cream  (LIDEX) 0.05 % APPLY 1 APPLICATION TO AFFECTEDE AREAS 2 TIMES DAILY AS NEEDED 06/27/20   Joaquim Nam, MD  furosemide (LASIX) 20 MG tablet TAKE 1 TABLET BY MOUTH ONCE DAILY 08/13/21   Joaquim Nam, MD  glipiZIDE (GLUCOTROL) 5 MG tablet TAKE 2 TABLETS BY MOUTH DAILY BEFORE BREAKFAST IF AM SUGAR IS ABOVE 150 11/12/21   Joaquim Nam, MD  Lancets Misc. (ACCU-CHEK FASTCLIX LANCET) KIT AS DIRECTED 05/23/17   Joaquim Nam, MD  losartan (COZAAR) 100 MG tablet TAKE 1 TABLET BY MOUTH ONCE A DAY (PLEASE SCHEDULE APPT) 01/28/22   Rollene Rotunda, MD  metFORMIN (GLUCOPHAGE) 500 MG tablet Take 2 tablets (1,000 mg total) by mouth 2 (two) times daily. 01/03/22   Joaquim Nam, MD  omeprazole (PRILOSEC OTC) 20 MG tablet Take 1 tablet (20 mg total) by mouth daily. 03/10/21 03/10/22  Jenel Lucks, MD  omeprazole (PRILOSEC) 20 MG capsule TAKE ONE CAPSULE BY MOUTH ONCE DAILY 05/14/22   Joaquim Nam, MD  potassium chloride (KLOR-CON) 10 MEQ tablet TAKE ONE TABLET BY MOUTH TWICE A DAY 05/28/22   Joaquim Nam, MD  pravastatin (PRAVACHOL) 20 MG tablet TAKE ONE TABLET BY MOUTH ONCE A DAY 04/15/22   Joaquim Nam, MD  psyllium (METAMUCIL) 58.6 % packet Take 1 packet by mouth daily.    [provider]  tamsulosin (FLOMAX) 0.4 MG CAPS capsule Take 1 capsule (0.4 mg total) by mouth daily. 10/04/21   Joaquim Nam, MD  warfarin (COUMADIN) 1 MG tablet TAKE ONE TABLET ON SUNDAY, TUESDAY, WEDNESDAY, THURSDAY AND SATURDAY AND TAKE 2 TABLET ON MONDAYS AND FRIDAYS OR AS DIRECTED BY ANTICOAGULATION CLINIC 06/18/22   Joaquim Nam, MD  warfarin (COUMADIN) 3 MG tablet TAKE 1 TABLET BY MOUTH ON SUNDAYS, TUESDAYS, WEDNESDAYS, THURSDAYS AND SATURDAYS OR AS DIRECTED BY ANTICOAGULATION CINIC 11/02/21   Joaquim Nam, MD  warfarin (COUMADIN) 3 MG tablet TAKE ONE 3 MG TABLET BY MOUTH DAILY AND ONE 1 MG TABLET BY MOUTH DAILY (4 MG TOTAL) EXCEPT TAKE TWO 1 MG TABLETS (2 MG TOTAL) ON FRIDAYS OR AS DIRECTED BY  ANTICOAGULATION CLINIC. 10/29/21   Joaquim Nam, MD  warfarin (COUMADIN) 3 MG tablet TAKE ONE TABLET BY MOUTH ON SUNDAY, TUESDAY,  WEDNESDAY, THURSDAY, AND SATURDAY OR AS DIRECTED BY ANTICOAGULATION CLINIC 06/18/22   Joaquim Nam, MD     Vitals:   07/22/22 1730 07/22/22 1800 07/22/22 1900 07/22/22 2030  BP: 117/78 117/83 122/86 109/66  Pulse: (!) 116 (!) 114 (!) 117 (!) 113  Resp: (!) 25 (!) 29 (!) 29 (!) 27  Temp:      TempSrc:      SpO2: 93% 95% 93% 93%  Weight:      Height:       Physical Exam Vitals and nursing note reviewed.  Constitutional:      General: He is not in acute distress. HENT:     Head: Normocephalic and atraumatic.     Right Ear: Hearing normal.     Left Ear: Hearing normal.     Nose: Nose normal. No nasal deformity.     Mouth/Throat:     Lips: Pink.     Tongue: No lesions.     Pharynx: Oropharynx is clear.  Eyes:     General: Lids are normal.     Extraocular Movements: Extraocular movements intact.  Cardiovascular:     Rate and Rhythm: Normal rate. Rhythm irregular.     Heart sounds: Normal heart sounds.  Pulmonary:     Effort: Pulmonary effort is normal.     Breath sounds: Normal breath sounds.  Abdominal:     General: Bowel sounds are normal. There is no distension.     Palpations: Abdomen is soft. There is no mass.     Tenderness: There is no abdominal tenderness.  Musculoskeletal:     Right lower leg: No edema.     Left lower leg: No edema.  Skin:    General: Skin is warm.  Neurological:     General: No focal deficit present.     Mental Status: He is alert and oriented to person, place, and time.     Cranial Nerves: Cranial nerves 2-12 are intact.  Psychiatric:        Attention and Perception: Attention normal.        Mood and Affect: Mood normal.        Speech: Speech normal.        Behavior: Behavior normal. Behavior is cooperative.      Labs on Admission: I have personally reviewed following labs and imaging  studies  CBC: Recent Labs  Lab 07/22/22 1555  WBC 7.7  HGB 14.9  HCT 43.9  MCV 92.0  PLT 149*   Basic Metabolic Panel: Recent Labs  Lab 07/22/22 1555 07/22/22 1755  NA 138  --   K 4.6  --   CL 106  --   CO2 24  --   GLUCOSE 213*  --   BUN 16  --   CREATININE 1.22  --   CALCIUM 9.0  --   MG  --  2.1   GFR: Estimated Creatinine Clearance: 69.8 mL/min (by C-G formula based on SCr of 1.22 mg/dL). Liver Function Tests: Recent Labs  Lab 07/22/22 1555  AST 28  ALT 22  ALKPHOS 78  BILITOT 1.1  PROT 6.9  ALBUMIN 3.9   No results for input(s): "LIPASE", "AMYLASE" in the last 168 hours. No results for input(s): "AMMONIA" in the last 168 hours. Coagulation Profile: Recent Labs  Lab 07/22/22 1836  INR 2.8*   Cardiac Enzymes: No results for input(s): "CKTOTAL", "CKMB", "CKMBINDEX", "TROPONINI" in the last 168 hours. BNP (last 3 results) No results for input(s): "PROBNP" in the  last 8760 hours. HbA1C: No results for input(s): "HGBA1C" in the last 72 hours. CBG: No results for input(s): "GLUCAP" in the last 168 hours. Lipid Profile: No results for input(s): "CHOL", "HDL", "LDLCALC", "TRIG", "CHOLHDL", "LDLDIRECT" in the last 72 hours. Thyroid Function Tests: No results for input(s): "TSH", "T4TOTAL", "FREET4", "T3FREE", "THYROIDAB" in the last 72 hours. Anemia Panel: No results for input(s): "VITAMINB12", "FOLATE", "FERRITIN", "TIBC", "IRON", "RETICCTPCT" in the last 72 hours. Urine analysis:    Component Value Date/Time   COLORURINE YELLOW 04/04/2022 1255   APPEARANCEUR CLEAR 04/04/2022 1255   LABSPEC 1.020 04/04/2022 1255   PHURINE 6.5 04/04/2022 1255   GLUCOSEU >=1000 (A) 04/04/2022 1255   HGBUR NEGATIVE 04/04/2022 1255   BILIRUBINUR NEGATIVE 04/04/2022 1255   KETONESUR NEGATIVE 04/04/2022 1255   PROTEINUR NEGATIVE 12/01/2010 1750   UROBILINOGEN 0.2 04/04/2022 1255   NITRITE NEGATIVE 04/04/2022 1255   LEUKOCYTESUR NEGATIVE 04/04/2022 1255     Radiological Exams on Admission: DG Chest Port 1 View  Result Date: 07/22/2022 CLINICAL DATA:  Chest pain.  Tachycardia.  Atrial fibrillation. EXAM: PORTABLE CHEST 1 VIEW COMPARISON:  04/17/2017 FINDINGS: The heart size and mediastinal contours are within normal limits. Prior median sternotomy and aortic valve replacement again noted. New diffuse interstitial infiltrates are seen. Tiny right pleural effusion also noted. No evidence of pulmonary consolidation. IMPRESSION: New diffuse interstitial infiltrates and tiny right pleural effusion, consistent with congestive heart failure. Electronically Signed   By: Danae Orleans M.D.   On: 07/22/2022 16:56     Data Reviewed: Relevant notes from primary care and specialist visits, past discharge summaries as available in EHR, including Care Everywhere. Prior diagnostic testing as pertinent to current admission diagnoses Updated medications and problem lists for reconciliation ED course, including vitals, labs, imaging, treatment and response to treatment Triage notes, nursing and pharmacy notes and ED provider's notes Notable results as noted in HPI  Assessment & Plan   Chest pain  Substernal Chest pain presentation.  Mild troponin elevation secondary to demand ischemia with increased heart rate and A-fib RVR. Cardiology consulted  Nitroglycerin, as needed morphine, IV PPI therapy, coreg dose halved until rate controlled.  S/P AVR Pharmacy consult for coumadin for mechanical valve and a.fib .  Diabetes mellitus, type II (HCC) Poorly controlled . Glycemic protocol.  Consistent carb cardiac diet.    Anemia Anemia/ H/O GIB/ esophageal stenosis: IV PPI therapy.  Type/ screen.  GERD With h/o GIB. PPI therapy.  Aspiration and fall precaution.   Atrial fibrillation with rapid ventricular response (HCC) Pt presenting with Palpitations/A-fib RVR Patient found to be in A-fib RVR and started on amiodarone drip.  Will admit patient to  cardiac tele unit and monitor.  Cardiology consult. Continue with Coumadin for anticoagulation.  Beta-blocker if room patient is on Coreg.  2D echocardiogram  per cardiology.   Essential hypertension Essential hypertension: Blood pressures have been in the 1 teens.  Patient said that_syncope and dizziness due to low blood pressure with blood pressure medications Jardiance and intermittent diuretic therapy as needed. Will defer to PCP to adjust her blood pressure medication regimen.  Patient is stable.  Of any dizziness or syncopal symptoms.  OBSTRUCTIVE SLEEP APNEA Advised pt to obtain oral appliance or find a treatment plan and d/w PCP.   DVT prophylaxis:  Coumadin.  Consults:  Cardiology: [facility].   Advance Care Planning:    Code Status: Full Code   Family Communication:  None.  Disposition Plan:  Back to previous home environment  Severity  of Illness: The appropriate patient status for this patient is INPATIENT. Inpatient status is judged to be reasonable and necessary in order to provide the required intensity of service to ensure the patient's safety. The patient's presenting symptoms, physical exam findings, and initial radiographic and laboratory data in the context of their chronic comorbidities is felt to place them at high risk for further clinical deterioration. Furthermore, it is not anticipated that the patient will be medically stable for discharge from the hospital within 2 midnights of admission.   * I certify that at the point of admission it is my clinical judgment that the patient will require inpatient hospital care spanning beyond 2 midnights from the point of admission due to high intensity of service, high risk for further deterioration and high frequency of surveillance required.*  Author: Gertha Calkin, MD 07/22/2022 9:42 PM  For on call review [email or web address].

## 2022-07-22 NOTE — Assessment & Plan Note (Signed)
With h/o GIB. PPI therapy.  Aspiration and fall precaution.

## 2022-07-22 NOTE — Telephone Encounter (Signed)
See ER note

## 2022-07-22 NOTE — Assessment & Plan Note (Addendum)
Poorly controlled . Glycemic protocol.  Consistent carb cardiac diet.

## 2022-07-22 NOTE — ED Provider Notes (Signed)
[facility] Provider Note   Event Date/Time   First MD Initiated Contact with Patient 07/22/22 1554     (approximate) History  Chest Pain  HPI [name] [name] is a 72 y.o. male with a stated past medical history of paroxysmal atrial fibrillation on Coumadin who presents complaining of substernal chest pain.  Patient states that this pain has been intermittent over the last week but has been persistent starting today.  Patient states that this feels similar to previous episodes of atrial fibrillation that he has had.  Patient endorses history of ACS ROS: Patient currently denies any vision changes, tinnitus, difficulty speaking, facial droop, sore throat, shortness of breath, abdominal pain, nausea/vomiting/diarrhea, dysuria, or weakness/numbness/paresthesias in any extremity   Physical Exam  Triage Vital Signs: ED Triage Vitals  Enc Vitals Group     BP 07/22/22 1550 138/80     Pulse Rate 07/22/22 1550 (!) 134     Resp 07/22/22 1550 20     Temp 07/22/22 1550 98.5 F (36.9 C)     Temp Source 07/22/22 1550 Oral     SpO2 07/22/22 1550 94 %     Weight 07/22/22 1550 234 lb (106.1 kg)     Height 07/22/22 1550 5\' 11"  (1.803 m)     Head Circumference --      Peak Flow --      Pain Score 07/22/22 1553 4     Pain Loc --      Pain Edu? --      Excl. in GC? --    Most recent vital signs: Vitals:   07/22/22 1730 07/22/22 1800  BP: 117/78 117/83  Pulse: (!) 116 (!) 114  Resp: (!) 25 (!) 29  Temp:    SpO2: 93% 95%   General: Awake, oriented x4. CV:  Good peripheral perfusion.  Resp:  Normal effort.  Abd:  No distention.  Other:  Elderly obese Caucasian male laying in bed in no acute distress ED Results / Procedures / Treatments  Labs (all labs ordered are listed, but only abnormal results are displayed) Labs Reviewed  CBC - Abnormal; Notable for the following components:      Result Value   Platelets 149 (*)    All other components within normal  limits  COMPREHENSIVE METABOLIC PANEL - Abnormal; Notable for the following components:   Glucose, Bld 213 (*)    All other components within normal limits  TROPONIN I (HIGH SENSITIVITY) - Abnormal; Notable for the following components:   Troponin I (High Sensitivity) 71 (*)    All other components within normal limits  PROTIME-INR  BRAIN NATRIURETIC PEPTIDE  TROPONIN I (HIGH SENSITIVITY)   EKG ED ECG REPORT I, Merwyn Katos, the attending physician, personally viewed and interpreted this ECG. Date: 07/22/2022 EKG Time: 1545 Rate: 134 Rhythm: Atrial fibrillation without ventricular response QRS Axis: normal Intervals: LBBB ST/T Wave abnormalities: normal Narrative Interpretation: LBBB with atrial fibrillation with rapid ventricular response.  No evidence of acute ischemia RADIOLOGY ED MD interpretation: Chest x-ray showing diffuse interstitial infiltrates and a tiny right pleural effusion consistent with CHF -Agree with radiology assessment Official radiology report(s): DG Chest Port 1 View  Result Date: 07/22/2022 CLINICAL DATA:  Chest pain.  Tachycardia.  Atrial fibrillation. EXAM: PORTABLE CHEST 1 VIEW COMPARISON:  04/17/2017 FINDINGS: The heart size and mediastinal contours are within normal limits. Prior median sternotomy and aortic valve replacement again noted. New diffuse interstitial infiltrates are seen. Tiny right pleural effusion also  noted. No evidence of pulmonary consolidation. IMPRESSION: New diffuse interstitial infiltrates and tiny right pleural effusion, consistent with congestive heart failure. Electronically Signed   By: Danae Orleans M.D.   On: 07/22/2022 16:56   PROCEDURES: Critical Care performed: Yes, see critical care procedure note(s) .1-3 Lead EKG Interpretation  Performed by: Merwyn Katos, MD Authorized by: Merwyn Katos, MD     Interpretation: abnormal     ECG rate:  104   ECG rate assessment: tachycardic     Rhythm: atrial fibrillation      Ectopy: none     Conduction: normal   CRITICAL CARE Performed by: Merwyn Katos  Total critical care time: 37 minutes  Critical care time was exclusive of separately billable procedures and treating other patients.  Critical care was necessary to treat or prevent imminent or life-threatening deterioration.  Critical care was time spent personally by me on the following activities: development of treatment plan with patient and/or surrogate as well as nursing, discussions with consultants, evaluation of patient's response to treatment, examination of patient, obtaining history from patient or surrogate, ordering and performing treatments and interventions, ordering and review of laboratory studies, ordering and review of radiographic studies, pulse oximetry and re-evaluation of patient's condition.  MEDICATIONS ORDERED IN ED: Medications  amiodarone (NEXTERONE PREMIX) 360-4.14 MG/200ML-% (1.8 mg/mL) IV infusion (60 mg/hr Intravenous New Bag/Given 07/22/22 1624)    Followed by  amiodarone (NEXTERONE PREMIX) 360-4.14 MG/200ML-% (1.8 mg/mL) IV infusion (has no administration in time range)  diltiazem (CARDIZEM) injection 20 mg (20 mg Intravenous Given 07/22/22 1605)  amiodarone (NEXTERONE) 1.8 mg/mL load via infusion 150 mg (150 mg Intravenous Bolus from Bag 07/22/22 1623)   IMPRESSION / MDM / ASSESSMENT AND PLAN / ED COURSE  I reviewed the triage vital signs and the nursing notes.                             The patient is on the cardiac monitor to evaluate for evidence of arrhythmia and/or significant heart rate changes. Patient's presentation is most consistent with acute presentation with potential threat to life or bodily function. + atrial fibrillation w/ RVR DDx: Pneumothorax, Pneumonia, Pulmonary Embolus, Tamponade, ACS, Thyrotoxicosis.  No history or evidence decompensated heart failure. Given their history and exam it is likely this patient is unlikely to spontaneously revert to a  rate controlled rhythm and necessitates a thorough workup for their arrhythmia. Workup: ECG, CXR, CBC, BMP, UA, Troponin, BNP, TSH, Ca-Mag-Phos Interventions: Defer Cardioversion (uncertain historical reliability with time of onset, increased risk of thromboembolic stroke).  Start amiodarone bolus and drip  Disposition: Admit   FINAL CLINICAL IMPRESSION(S) / ED DIAGNOSES   Final diagnoses:  Atrial fibrillation with rapid ventricular response (HCC)  Chest pain, unspecified type   Rx / DC Orders   ED Discharge Orders     None      Note:  This document was prepared using Dragon voice recognition software and may include unintentional dictation errors.   Merwyn Katos, MD 07/22/22 Ebony Cargo

## 2022-07-22 NOTE — Assessment & Plan Note (Signed)
History of anemia/ GIB/ esophageal stenosis: Hemoglobin currently at 14.9 and stable IV PPI therapy.

## 2022-07-22 NOTE — Telephone Encounter (Signed)
Per chart review tab pt is at [facility] ED. Sending note to Dr Duncan and Duncan pool. 

## 2022-07-22 NOTE — ED Notes (Signed)
Pt given drink 

## 2022-07-23 ENCOUNTER — Encounter: Payer: Self-pay | Admitting: Internal Medicine

## 2022-07-23 ENCOUNTER — Inpatient Hospital Stay (HOSPITAL_COMMUNITY)
Admit: 2022-07-23 | Discharge: 2022-07-23 | Disposition: A | Discharge status: Home / Self Care | Payer: Medicare PPO | Attending: Internal Medicine | Admitting: Internal Medicine

## 2022-07-23 DIAGNOSIS — Z952 Presence of prosthetic heart valve: Secondary | ICD-10-CM | POA: Diagnosis not present

## 2022-07-23 DIAGNOSIS — E119 Type 2 diabetes mellitus without complications: Secondary | ICD-10-CM | POA: Diagnosis not present

## 2022-07-23 DIAGNOSIS — R072 Precordial pain: Secondary | ICD-10-CM

## 2022-07-23 DIAGNOSIS — I5021 Acute systolic (congestive) heart failure: Secondary | ICD-10-CM

## 2022-07-23 DIAGNOSIS — I4891 Unspecified atrial fibrillation: Secondary | ICD-10-CM

## 2022-07-23 DIAGNOSIS — I10 Essential (primary) hypertension: Secondary | ICD-10-CM

## 2022-07-23 DIAGNOSIS — R079 Chest pain, unspecified: Secondary | ICD-10-CM | POA: Diagnosis not present

## 2022-07-23 LAB — TYPE AND SCREEN
ABO/RH(D): O POS
Antibody Screen: NEGATIVE

## 2022-07-23 LAB — CBG MONITORING, ED
Glucose-Capillary: 133 mg/dL — ABNORMAL HIGH (ref 70–99)
Glucose-Capillary: 210 mg/dL — ABNORMAL HIGH (ref 70–99)
Glucose-Capillary: 150 mg/dL — ABNORMAL HIGH (ref 70–99)

## 2022-07-23 LAB — ECHOCARDIOGRAM COMPLETE
AR max vel: 3.04 cm2
AV Area VTI: 2.95 cm2
AV Area mean vel: 2.95 cm2
AV Mean grad: 5 mmHg
AV Peak grad: 9.8 mmHg
Ao pk vel: 1.56 m/s
Area-P 1/2: 4.49 cm2
Calc EF: 0 %
Height: 71 in
MV VTI: 4.38 cm2
S' Lateral: 4.3 cm
Single Plane A2C EF: 15.9 %
Single Plane A4C EF: 0.3 %
Weight: 3744 oz

## 2022-07-23 LAB — PROTIME-INR
INR: 2.7 — ABNORMAL HIGH (ref 0.8–1.2)
Prothrombin Time: 29.3 seconds — ABNORMAL HIGH (ref 11.4–15.2)

## 2022-07-23 LAB — HEMOGLOBIN A1C
Hgb A1c MFr Bld: 7.7 % — ABNORMAL HIGH (ref 4.8–5.6)
Mean Plasma Glucose: 174.29 mg/dL

## 2022-07-23 LAB — GLUCOSE, CAPILLARY: Glucose-Capillary: 137 mg/dL — ABNORMAL HIGH (ref 70–99)

## 2022-07-23 LAB — HIV ANTIBODY (ROUTINE TESTING W REFLEX): HIV Screen 4th Generation wRfx: NONREACTIVE

## 2022-07-23 MED ORDER — TAMSULOSIN HCL 0.4 MG PO CAPS
0.4000 mg | ORAL_CAPSULE | Freq: Every day | ORAL | Status: DC
  Administered 2022-07-23 – 2022-07-25 (×3): 0.4 mg via ORAL
  Filled 2022-07-23 (×3): qty 1

## 2022-07-23 MED ORDER — FUROSEMIDE 10 MG/ML IJ SOLN
20.0000 mg | Freq: Once | INTRAMUSCULAR | Status: AC
  Administered 2022-07-23: 20 mg via INTRAVENOUS
  Filled 2022-07-23: qty 4

## 2022-07-23 MED ORDER — WARFARIN SODIUM 4 MG PO TABS
4.0000 mg | ORAL_TABLET | Freq: Once | ORAL | Status: AC
  Administered 2022-07-23: 4 mg via ORAL
  Filled 2022-07-23: qty 1

## 2022-07-23 MED ORDER — PERFLUTREN LIPID MICROSPHERE
1.0000 mL | INTRAVENOUS | Status: AC | PRN
  Administered 2022-07-23: 5 mL via INTRAVENOUS

## 2022-07-23 MED ORDER — PRAVASTATIN SODIUM 20 MG PO TABS
20.0000 mg | ORAL_TABLET | Freq: Every day | ORAL | Status: DC
  Administered 2022-07-23 – 2022-07-25 (×3): 20 mg via ORAL
  Filled 2022-07-23 (×3): qty 1

## 2022-07-23 NOTE — ED Notes (Signed)
Cardiology team at bedside

## 2022-07-23 NOTE — Progress Notes (Signed)
Progress Note   Patient: [name] [name] PPI:[id number] DOB: [birth year] DOA: 07/22/2022     1 DOS: the patient was seen and examined on 07/23/2022   Brief hospital course:  [name] [name] is a 72 y.o. male with medical history significant for aortic insufficiency, bicuspid aortic valve status post [facility] mechanical valve on Coumadin and need for endocarditis prophylaxis, pt sees [facility] cardiology- 2011, diabetes mellitus type 2, anemia with a history of duodenal ulcer and GI bleed, hypertension, necrotizing pancreatitis, pericardial effusion, pneumonia, sleep apnea, hypertension, HFrEF with last EF of 45 to 50%, obesity coming to the hospital for palpitations and chest discomfort.   He was found to be in A-fib with RVR.  Started on amiodarone infusion. Cardiology was consulted.  6/25: Heart rate improved but remained in low 100, BNP 244, INR 2.7.  TSH normal with mildly elevated free T4 at 1.16.  Troponin 71>>70. Cardiology is planning cardioversion tomorrow. Echocardiogram done-pending results    Assessment and Plan: * Atrial fibrillation with rapid ventricular response (HCC) Pt presenting with Palpitations/A-fib RVR Started on amiodarone infusion-rate improved with remain in low 100s.  Switching between atrial flutter and fibrillation. -Continue with amiodarone infusion -Going for cardioversion tomorrow -Continue with Coumadin   Chest pain  Substernal Chest pain presentation.  Mild troponin elevation secondary to demand ischemia with increased heart rate and A-fib RVR. Cardiology consulted. No st t wave changes.  .  Nitroglycerin, as needed morphine, IV PPI therapy. -Echocardiogram done-pending results  S/P AVR Pharmacy consult for coumadin for mechanical valve and a.fib .  Diabetes mellitus, type II (HCC) CBG within goal, A1c of 7.7. Patient was on metformin, glipizide and Jardiance at home -Continue with SSI   Essential hypertension Blood pressures within goal.   Patient said that_syncope and dizziness due to low blood pressure with blood pressure medications Jardiance and intermittent diuretic therapy as needed. Will defer to PCP to adjust her blood pressure medication regimen.  Patient is stable.  Of any dizziness or syncopal symptoms.  Anemia History of anemia/ GIB/ esophageal stenosis: Hemoglobin currently at 14.9 and stable IV PPI therapy.    GERD With h/o GIB. PPI therapy.  Aspiration and fall precaution.   OBSTRUCTIVE SLEEP APNEA Advised pt to obtain oral appliance or find a treatment plan and d/w PCP.    Subjective: Patient was seen and examined today.  Denies any more chest pain or shortness of breath.  Physical Exam: Vitals:   07/23/22 1019 07/23/22 1030 07/23/22 1123 07/23/22 1336  BP:  97/68  128/87  Pulse: (!) 112 (!) 113  (!) 104  Resp: (!) 26 (!) 24  20  Temp:   98.4 F (36.9 C)   TempSrc:   Oral   SpO2: 92% 94%  92%  Weight:      Height:       General.  Obese elderly man, in no acute distress. Pulmonary.  Lungs clear bilaterally, normal respiratory effort. CV.  Regularly regular Abdomen.  Soft, nontender, nondistended, BS positive. CNS.  Alert and oriented .  No focal neurologic deficit. Extremities.  No edema, no cyanosis, pulses intact and symmetrical. Psychiatry.  Judgment and insight appears normal.   Data Reviewed: Prior data reviewed  Family Communication: Discussed with wife at bedside  Disposition: Status is: Inpatient Remains inpatient appropriate because: Severity of illness  Planned Discharge Destination: Home  DVT prophylaxis.  Coumadin Time spent: 50 minutes  This record has been created using Conservation officer, historic buildings. Errors have been sought and  corrected,but may not always be located. Such creation errors do not reflect on the standard of care.   Author: Arnetha Courser, MD 07/23/2022 3:04 PM  For on call review [email or web address].

## 2022-07-23 NOTE — Consult Note (Signed)
Cardiology Consultation   Patient ID: [name] MRN: [id number]; DOB: May 09, 1950  Admit date: 07/22/2022 Date of Consult: 07/23/2022  PCP:  [name] Nam, MD   [facility] Providers Cardiologist:  [name] Rotunda, MD        Patient Profile:   [name] [name] is a 72 y.o. male with a hx of essential hypertension, dilated cardiomyopathy, s/p aortic valve replacement on chronic anticoagulation with warfarin, obstructive sleep apnea, gastroesophageal reflux disease, type 2 diabetes, hypercholesterolemia, obesity, longstanding history of palpitations, who is being seen 07/23/2022 for the evaluation of atrial fibrillation RVR and complaints of chest pain at the request of Dr. Allena Katz.  History of Present Illness:   [name] [name] had previously aortic valve replacement at with a [facility] Jude mechanical valve in 06/2009 and has been on warfarin since that time.  States that he has been compliant with this medication.  INRs have been therapeutic.  He has been told in the past stated that he had episodes of atrial fibrillation.  He was last seen in clinic 01/17/2022 for follow-up of his AVR.  He had an echocardiogram done in January that showed his AVR was stable with an LVEF of 45-50%.   He presented to the [facility] emergency department 07/22/2022 with complaints of substernal chest pain.  He stated that his pain been intermittent over the last week but been persistent started today.  He stated that he felt similar to previous episodes of his atrial fibrillation that he had had.  He denies any other associated symptoms of shortness of breath, lightheadedness, dizziness, and no peripheral edema.  He did know that his heart rate was averaging 130s per his pulse oximeter.  He had called his PCPs office and was advised to go to the emergency department for further evaluation. On examination this morning he states that his chest discomfort had resolved, he had a lot of palpitations, denied any  shortness of breath.  Initial vitals: Blood pressure 138/80, pulse of 134, respirations 20, temperature 98.5  Pertinent labs: Blood glucose 213, high-sensitivity troponin 71 and 70, BNP 244.9, TSH 0.981, A1c 7.7  Imaging: Chest x-ray revealed new diffuse interstitial infiltrates with tiny right pleural effusion, consistent with congestive heart failure  Medications administered in the emergency department: Amiodarone bolus, amiodarone drip, Cardizem 20 mg IVP  Cardiology was consulted for chest discomfort and atrial fibrillation RVR requiring amiodarone infusion.  Past Medical History:  Diagnosis Date   Anemia    Aortic insufficiency    with bicuspid aortic valve (the last MRI demonstrated the aortic root to be 4.8 x 4.7 cm.) Mod regurgitation. Normal chamber size with very mild left ventricular dysfunction.)   Blood transfusion    CHF (congestive heart failure) (HCC)    Clotting disorder (HCC)    Cough 06/28/2009   Dr. 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   Pericardial effusion 07/2009   admission to [facility], s/p pericardial window   Pleural effusion 10/2009   s/p thoracentesis   PNA (pneumonia) 10/2009   admnission to [facility], with right pleural effusion, s/p thoracentesis  Sleep apnea    Syncope 08/2009   admission for fever and syncope, no source for fever seen on cultures. Syncope thought to be r/t orthostasis    Past Surgical History:  Procedure Laterality Date   CARDIAC VALVE REPLACEMENT  07/10/2009   AVR   Cardiolite EKG  03/10/2003   (-) ? small/apical infarct EF 50%   DOPPLER ECHOCARDIOGRAPHY  01/18/97    LVH, mild dilated aorta - root mod to severe aortic regurg EF 50-55%   DOPPLER ECHOCARDIOGRAPHY  09/27/03   No changes, moderate severe aortic regurg   DOPPLER ECHOCARDIOGRAPHY  07/09/2004   Mod severe aortic regurg 2-3+ T.R., T.I.R., mild P..R.   DOPPLER ECHOCARDIOGRAPHY  04/01/06   Hypokin Apex EF 55%   DOPPLER ECHOCARDIOGRAPHY  02/15/2010   Mild LVH, mild decr Sys fctn EF 40-45% Mild Dias Dysfctn Mech AV Triv AR, MR   ESOPHAGOGASTRODUODENOSCOPY  1990, 1999   Hot dog in throat (1987)/ Chicken in throat (1990)/ 01/06/97 (Dr. Kinnie Scales) Impaction, dilatation, Schatzki's Ring/ 07/1998 repeat Dilatation/ 01/10/01 stricture/dilated/ 03/08/05 Schatzki's Ring, dilated/ 06/01/08 Food Disimpaction [name] Bulb Ulcer (Dr. Ewing Schlein)   ESOPHAGOGASTRODUODENOSCOPY  01/06/1997   (Dr. Kinnie Scales) Impaction, dilatation, Schatzki's Ring   ESOPHAGOGASTRODUODENOSCOPY  08/09/98   Repeat dilatation [name])   ESOPHAGOGASTRODUODENOSCOPY  01/13/2001   stricture/ HH, duodenal ulcer / bleeding Marina Goodell)   ESOPHAGOGASTRODUODENOSCOPY  12/25/01   esophagus stricture, dilated   ESOPHAGOGASTRODUODENOSCOPY  03/23/2001   stricture, dilated   ESOPHAGOGASTRODUODENOSCOPY  03/08/05   Schatzki's Ring   03/13/05  Schatzki's Ring - dilated, duodenitis   ESOPHAGOGASTRODUODENOSCOPY  06/11/08   with food disimpaction.  Food impaction Ms HH  Sm bulb ulcer (Dr. Ewing Schlein)   ESOPHAGOGASTRODUODENOSCOPY     stricture/HH, duodenal ulcer/bleeding Marina Goodell)   ESOPHAGOGASTRODUODENOSCOPY N/A 01/23/2016   Procedure: ESOPHAGOGASTRODUODENOSCOPY (EGD);  Surgeon: Midge Minium, MD;  Location: [facility] ENDOSCOPY;  Service: Endoscopy;  Laterality: N/A;   ESOPHAGOGASTRODUODENOSCOPY N/A 03/10/2021   Procedure: ESOPHAGOGASTRODUODENOSCOPY (EGD);  Surgeon: Jenel Lucks, MD;  Location: [facility] ENDOSCOPY;  Service: Gastroenterology;  Laterality: N/A;   ESOPHAGOGASTRODUODENOSCOPY (EGD) WITH PROPOFOL N/A 02/06/2016   Procedure: ESOPHAGOGASTRODUODENOSCOPY (EGD) WITH PROPOFOL;  Surgeon:  Midge Minium, MD;  Location: [facility] ENDOSCOPY;  Service: Endoscopy;  Laterality: N/A;   FOREIGN BODY REMOVAL  03/10/2021   Procedure: FOREIGN BODY REMOVAL;  Surgeon: Jenel Lucks, MD;  Location: [facility] ENDOSCOPY;  Service: Gastroenterology;;   HERNIA REPAIR  2011   abdominal; post AVR   MRI  2/05   LVH global hypokin EF 48% Mod A.I.   VALVE REPLACEMENT  06/2009   St. Jude mechanical valve per Dr. Laneta Simmers     Home Medications:  Prior to Admission medications   Medication Sig Start Date End Date Taking? Authorizing Provider  ACCU-CHEK AVIVA PLUS test strip USE AS DIRECTED TO CHECK BLOOD SUGAR 2 TO 4 TIMES A DAY 09/29/20  Yes [name] Nam, MD  Accu-Chek FastClix Lancets MISC USE AS DIRECTED TO CHECK BLOOD SUGAR TWICE DAILY 09/29/20  Yes [name] Nam, MD  amoxicillin (AMOXIL) 500 MG tablet Take 4 tablets (2,000 mg total) by mouth daily as needed. Before dental visits. Dispense 5 courses 04/04/22  Yes [name] Nam, MD  azelastine (ASTELIN) 0.1 % nasal spray Place 2 sprays into both nostrils 2 (two) times daily as needed for rhinitis. Use in each nostril as directed 09/29/20  Yes [name] Nam, MD  carvedilol (COREG) 6.25 MG tablet TAKE ONE TABLET BY MOUTH TWICE A DAY. KEEP OFFICE VISIT 04/08/22  Yes  [name] Rotunda, MD  cetirizine (ZYRTEC) 10 MG tablet Take 10 mg by mouth daily.   Yes [provider]  empagliflozin (JARDIANCE) 10 MG TABS tablet Take 1 tablet (10 mg total) by mouth daily before breakfast. 01/17/22  Yes [name] Rotunda, MD  fluocinonide cream (LIDEX) 0.05 % APPLY 1 APPLICATION TO AFFECTEDE AREAS 2 TIMES DAILY AS NEEDED 06/27/20  Yes [name] Nam, MD  glipiZIDE (GLUCOTROL) 5 MG tablet TAKE 2 TABLETS BY MOUTH DAILY BEFORE BREAKFAST IF AM SUGAR IS ABOVE 150 Patient taking differently: Take 5-10 mg by mouth See admin instructions. Take 1 tablet (5mg ) by mouth every morning before breakfast - take 2 tablets (10mg ) by mouth before breakfast if BG >150 11/12/21  Yes [name] Nam, MD  Lancets Misc. (ACCU-CHEK FASTCLIX LANCET) KIT AS DIRECTED 05/23/17  Yes [name] Nam, MD  losartan (COZAAR) 100 MG tablet TAKE 1 TABLET BY MOUTH ONCE A DAY (PLEASE SCHEDULE APPT) 01/28/22  Yes [name] Rotunda, MD  metFORMIN (GLUCOPHAGE) 500 MG tablet Take 2 tablets (1,000 mg total) by mouth 2 (two) times daily. 01/03/22  Yes [name] Nam, MD  omeprazole (PRILOSEC) 20 MG capsule TAKE ONE CAPSULE BY MOUTH ONCE DAILY Patient taking differently: Take 20 mg by mouth daily before breakfast. 05/14/22  Yes [name] Nam, MD  potassium chloride (KLOR-CON) 10 MEQ tablet TAKE ONE TABLET BY MOUTH TWICE A DAY 05/28/22  Yes [name] Nam, MD  pravastatin (PRAVACHOL) 20 MG tablet TAKE ONE TABLET BY MOUTH ONCE A DAY 04/15/22  Yes [name] Nam, MD  psyllium (METAMUCIL) 58.6 % packet Take 1 packet by mouth daily.   Yes [provider]  tamsulosin (FLOMAX) 0.4 MG CAPS capsule Take 1 capsule (0.4 mg total) by mouth daily. Patient taking differently: Take 0.4 mg by mouth daily before breakfast. 10/04/21  Yes [name] Nam, MD  WARFARIN SODIUM PO Take 2-4 mg by mouth See admin instructions. Take 4mg  by mouth every Tuesday, Wednesday, Thursday, Saturday and Sunday night and take 2mg  by mouth every Monday and Friday night   Yes [provider]  furosemide (LASIX) 20 MG tablet TAKE 1 TABLET BY MOUTH ONCE DAILY Patient not taking: Reported on 07/22/2022 08/13/21   [name] Nam, MD  warfarin (COUMADIN) 1 MG tablet TAKE ONE TABLET ON SUNDAY, TUESDAY, WEDNESDAY, THURSDAY AND SATURDAY AND TAKE 2 TABLET ON MONDAYS AND FRIDAYS OR AS DIRECTED BY ANTICOAGULATION CLINIC 06/18/22   [name] Nam, MD  warfarin (COUMADIN) 3 MG tablet TAKE 1 TABLET BY MOUTH ON SUNDAYS, TUESDAYS, WEDNESDAYS, THURSDAYS AND SATURDAYS OR AS DIRECTED BY ANTICOAGULATION CINIC 11/02/21   [name] Nam, MD  warfarin (COUMADIN) 3 MG tablet TAKE ONE 3 MG TABLET BY MOUTH DAILY AND ONE 1 MG TABLET BY MOUTH DAILY (4 MG  TOTAL) EXCEPT TAKE TWO 1 MG TABLETS (2 MG TOTAL) ON FRIDAYS OR AS DIRECTED BY ANTICOAGULATION CLINIC. 10/29/21   [name] Nam, MD  warfarin (COUMADIN) 3 MG tablet TAKE ONE TABLET BY MOUTH ON SUNDAY, TUESDAY, WEDNESDAY, THURSDAY, AND SATURDAY OR AS DIRECTED BY ANTICOAGULATION CLINIC 06/18/22   [name] Nam, MD    Inpatient Medications: Scheduled Meds:  carvedilol  3.125 mg Oral BID WC   insulin aspart  0-15 Units Subcutaneous TID WC   pantoprazole (PROTONIX) IV  40 mg Intravenous Q12H   sodium chloride flush  3 mL Intravenous Q12H   warfarin  4 mg Oral ONCE-1600   Warfarin - Pharmacist Dosing Inpatient   Does not apply  q1600   Continuous Infusions:  sodium chloride     amiodarone 30 mg/hr (07/23/22 0719)   PRN Meds: sodium chloride, acetaminophen **OR** acetaminophen, hydrALAZINE, morphine injection, nitroGLYCERIN, ondansetron **OR** ondansetron (ZOFRAN) IV, sodium chloride flush  Allergies:    Allergies  Allergen Reactions   Ace Inhibitors     REACTION: cough   Aspirin Other (See Comments)    Gastritis.    Other     dexcom patch- local rash- presumed adhesive allergy.     Primaxin [Imipenem W/Cilastatin Sodium]     Rash- but able to take amoxil w/o troubles.    Tape     Rash from bandaids.      Social History:   Social History   Socioeconomic History   Marital status: Married    Spouse name: Not on file   Number of children: 2   Years of education: Not on file   Highest education level: Not on file  Occupational History   Occupation: mech. supervisor    Employer: [employer]    Comment[place]  Tobacco Use   Smoking status: Never   Smokeless tobacco: Never  Vaping Use   Vaping Use: Never used  Substance and Sexual Activity   Alcohol use: No    Alcohol/week: 0.0 standard drinks of alcohol   Drug use: No   Sexual activity: Not on file  Other Topics Concern   Not on file  Social History Narrative   Married 1996   Social Determinants of  Health   Financial Resource Strain: Low Risk  (01/02/2022)   Overall Financial Resource Strain (CARDIA)    Difficulty of Paying Living Expenses: Not hard at all  Food Insecurity: No Food Insecurity (07/23/2022)   Hunger Vital Sign    Worried About Running Out of Food in the Last Year: Never true    Ran Out of Food in the Last Year: Never true  Transportation Needs: No Transportation Needs (07/23/2022)   PRAPARE - Administrator, Civil Service (Medical): No    Lack of Transportation (Non-Medical): No  Physical Activity: Inactive (12/16/2018)   Exercise Vital Sign    Days of Exercise per Week: 0 days    Minutes of Exercise per Session: 0 min  Stress: No Stress Concern Present (01/02/2022)   [employer] of Occupational Health - Occupational Stress Questionnaire    Feeling of Stress : Not at all  Social Connections: Unknown (01/02/2022)   Social Connection and Isolation Panel [NHANES]    Frequency of Communication with Friends and Family: Not on file    Frequency of Social Gatherings with Friends and Family: Not on file    Attends Religious Services: Not on file    Active Member of Clubs or Organizations: Not on file    Attends Banker Meetings: Not on file    Marital Status: Married  Intimate Partner Violence: Not At Risk (07/23/2022)   Humiliation, Afraid, Rape, and Kick questionnaire    Fear of Current or Ex-Partner: No    Emotionally Abused: No    Physically Abused: No    Sexually Abused: No    Family History:    Family History  Problem Relation Age of Onset   Obesity Mother        bedridden   Hypertension Mother    Heart disease Father        CABG, CAD   Hypertension Father    Lymphoma Father    Cancer Father  lymphoma   Alcohol abuse Brother    Cancer Brother        melanoma   Colon cancer Paternal Aunt    Stomach cancer Neg Hx    Rectal cancer Neg Hx    Prostate cancer Neg Hx      ROS:  Please see the history of present  illness.  Review of Systems  Respiratory:  Positive for shortness of breath.   Cardiovascular:  Positive for chest pain.    All other ROS reviewed and negative.     Physical Exam/Data:   Vitals:   07/23/22 1019 07/23/22 1030 07/23/22 1123 07/23/22 1336  BP:  97/68  128/87  Pulse: (!) 112 (!) 113  (!) 104  Resp: (!) 26 (!) 24  20  Temp:   98.4 F (36.9 C)   TempSrc:   Oral   SpO2: 92% 94%  92%  Weight:      Height:        Intake/Output Summary (Last 24 hours) at 07/23/2022 1432 Last data filed at 07/23/2022 1011 Gross per 24 hour  Intake 3 ml  Output 250 ml  Net -247 ml      07/22/2022    3:50 PM 04/04/2022    9:05 AM 01/17/2022    8:29 AM  Last 3 Weights  Weight (lbs) 234 lb 237 lb 243 lb  Weight (kg) 106.142 kg 107.502 kg 110.224 kg     Body mass index is 32.64 kg/m.  General:  Well nourished, well developed, in no acute distress HEENT: normal Neck: no JVD Vascular: No carotid bruits; Distal pulses 2+ bilaterally Cardiac:  normal S1, S2; IR IR; no murmur, very audible click from his mechanical valve Lungs:  clear to auscultation bilaterally, no wheezing, rhonchi or rales  Abd: soft, nontender, obese, no hepatomegaly  Ext: Trace pretibial edema Musculoskeletal:  No deformities, BUE and BLE strength normal and equal Skin: warm and dry, venous stasis discoloration to the bilateral lower extremities Neuro:  CNs 2-12 intact, no focal abnormalities noted Psych:  Normal affect   EKG:  The EKG was personally reviewed and demonstrates: Atrial fibrillation with RVR chronic left bundle branch block Telemetry:  Telemetry was personally reviewed and demonstrates: atrial flutter that converted to atrial fibrillation around midnight with chronic left bundle branch block rates of 90-1 30  Relevant CV Studies:  Echocardiogram ordered and pending with further recommendations to follow  TTE 02/16/21 1. Mid and basal inferior wall hypokinesis Abnormal septal motion from ?  LBBB.  Left ventricular ejection fraction, by estimation, is 45 to 50%. The  left ventricle has mildly decreased function. The left ventricle  demonstrates regional wall motion  abnormalities (see scoring diagram/findings for description). The left  ventricular internal cavity size was moderately dilated. There is mild  left ventricular hypertrophy. Left ventricular diastolic parameters were  normal.   2. Right ventricular systolic function is normal. The right ventricular  size is normal.   3. Left atrial size was moderately dilated.   4. The mitral valve is abnormal. Mild mitral valve regurgitation. No  evidence of mitral stenosis.   5. Post mechanical AVR No information on type and no old echo in system  No significant PVL low gradients and AVA 2.3 cm2 . The aortic valve has  been repaired/replaced. Aortic valve regurgitation is not visualized. No  aortic stenosis is present.   6. The inferior vena cava is normal in size with greater than 50%  respiratory variability, suggesting right atrial pressure of  3 mmHg.    Laboratory Data:  High Sensitivity Troponin:   Recent Labs  Lab 07/22/22 1555 07/22/22 1755  TROPONINIHS 71* 70*     Chemistry Recent Labs  Lab 07/22/22 1555 07/22/22 1755  NA 138  --   K 4.6  --   CL 106  --   CO2 24  --   GLUCOSE 213*  --   BUN 16  --   CREATININE 1.22  --   CALCIUM 9.0  --   MG  --  2.1  GFRNONAA >60  --   ANIONGAP 8  --     Recent Labs  Lab 07/22/22 1555  PROT 6.9  ALBUMIN 3.9  AST 28  ALT 22  ALKPHOS 78  BILITOT 1.1   Lipids No results for input(s): "CHOL", "TRIG", "HDL", "LABVLDL", "LDLCALC", "CHOLHDL" in the last 168 hours.  Hematology Recent Labs  Lab 07/22/22 1555  WBC 7.7  RBC 4.77  HGB 14.9  HCT 43.9  MCV 92.0  MCH 31.2  MCHC 33.9  RDW 14.9  PLT 149*   Thyroid  Recent Labs  Lab 07/22/22 2206  TSH 0.981  FREET4 1.16*    BNP Recent Labs  Lab 07/22/22 1842  BNP 244.9*    DDimer No results for input(s):  "DDIMER" in the last 168 hours.   Radiology/Studies:  [facility] Chest Port 1 View  Result Date: 07/22/2022 CLINICAL DATA:  Chest pain.  Tachycardia.  Atrial fibrillation. EXAM: PORTABLE CHEST 1 VIEW COMPARISON:  04/17/2017 FINDINGS: The heart size and mediastinal contours are within normal limits. Prior median sternotomy and aortic valve replacement again noted. New diffuse interstitial infiltrates are seen. Tiny right pleural effusion also noted. No evidence of pulmonary consolidation. IMPRESSION: New diffuse interstitial infiltrates and tiny right pleural effusion, consistent with congestive heart failure. Electronically Signed   By: Danae Orleans M.D.   On: 07/22/2022 16:56     Assessment and Plan:   Atrial fibrillation with RVR -Noted to be in a flutter then transition to atrial fibrillation around midnight -Started on amiodarone infusion by emergency department provider -Symptomatic with elevated heart rates -Scheduled for elective cardioversion procedure tomorrow 1230 -N.p.o. after midnight -TSH 0.981 -Continue with cardiac monitoring  Aortic valve replacement -Status post [facility] Jude mechanical valve replacement in 2011 -Echocardiogram ordered and pending with further recommendations to follow -Continued on warfarin for CHA2DS2-VASc score of at least 3 for stroke prophylaxis -INR 2.7  Chest pain with mildly elevated high-sensitivity troponins -Presented with complaints of substernal chest pain -This chest discomfort was similar to previous bouts of atrial fibrillation -High-sensitivity troponins trended flat likely from demand ischemia from elevated heart rate and blood pressure -No ischemic changes noted on EKG -Chest pain-free on exam -EKG as needed for pain or changes  Essential hypertension -Blood pressure 117/72 -Continued on carvedilol 3.125 mg twice daily and amiodarone infusion -Vital signs per unit protocol  Type 2 diabetes -Continued on insulin therapy -Management per  IM  Obstructive sleep apnea -Has not been compliant with therapy   Informed Consent   Shared Decision Making/Informed Consent The risks (stroke, cardiac arrhythmias rarely resulting in the need for a temporary or permanent pacemaker, skin irritation or burns and complications associated with conscious sedation including aspiration, arrhythmia, respiratory failure and death), benefits (restoration of normal sinus rhythm) and alternatives of a direct current cardioversion were explained in detail to Mr. Selley and he agrees to proceed.        Risk Assessment/Risk Scores:  TIMI Risk Score for Unstable Angina or Non-ST Elevation MI:   The patient's TIMI risk score is 4, which indicates a 20% risk of all cause mortality, new or recurrent myocardial infarction or need for urgent revascularization in the next 14 days.    CHA2DS2-VASc Score = 3   This indicates a 3.2% annual risk of stroke. The patient's score is based upon: CHF History: 0 HTN History: 1 Diabetes History: 1 Stroke History: 0 Vascular Disease History: 0 Age Score: 1 Gender Score: 0         For questions or updates, please contact [facility] Please consult [email or web address] for contact info under    Signed, Cherylee Rawlinson, NP  07/23/2022 2:32 PM

## 2022-07-23 NOTE — Progress Notes (Signed)
*  PRELIMINARY RESULTS* Echocardiogram 2D Echocardiogram has been performed.  Carolyne Fiscal 07/23/2022, 1:02 PM

## 2022-07-23 NOTE — Progress Notes (Signed)
   07/23/22 1803  Assess: MEWS Score  Temp 98.3 F (36.8 C)  BP 126/82  MAP (mmHg) 93  Pulse Rate (!) 111  Resp 18  SpO2 96 %  O2 Device Room Air  Assess: MEWS Score  MEWS Temp 0  MEWS Systolic 0  MEWS Pulse 2  MEWS RR 0  MEWS LOC 0  MEWS Score 2  MEWS Score Color Yellow  Assess: if the MEWS score is Yellow or Red  Were vital signs taken at a resting state? Yes  Focused Assessment No change from prior assessment  Does the patient meet 2 or more of the SIRS criteria? No  MEWS guidelines implemented  No, previously yellow, continue vital signs every 4 hours  Assess: SIRS CRITERIA  SIRS Temperature  0  SIRS Pulse 1  SIRS Respirations  0  SIRS WBC 0  SIRS Score Sum  1

## 2022-07-23 NOTE — Consult Note (Signed)
ANTICOAGULATION CONSULT NOTE   Pharmacy Consult for warfarin Indication: AVR and Afib  Allergies  Allergen Reactions   Ace Inhibitors     REACTION: cough   Aspirin Other (See Comments)    Gastritis.    Other     dexcom patch- local rash- presumed adhesive allergy.     Primaxin [Imipenem W/Cilastatin Sodium]     Rash- but able to take amoxil w/o troubles.    Tape     Rash from bandaids.      Patient Measurements: Height: 5\' 11"  (180.3 cm) Weight: 106.1 kg (234 lb) IBW/kg (Calculated) : 75.3   Vital Signs: Temp: 98.6 F (37 C) (06/25 0714) Temp Source: Oral (06/25 0714) BP: 130/95 (06/25 0830) Pulse Rate: 115 (06/25 0830)  Labs: Recent Labs    07/22/22 1555 07/22/22 1755 07/22/22 1836 07/23/22 0510  HGB 14.9  --   --   --   HCT 43.9  --   --   --   PLT 149*  --   --   --   LABPROT  --   --  29.6* 29.3*  INR  --   --  2.8* 2.7*  CREATININE 1.22  --   --   --   TROPONINIHS 71* 70*  --   --      Estimated Creatinine Clearance: 69.8 mL/min (by C-G formula based on SCr of 1.22 mg/dL).   Medical History: Past Medical History:  Diagnosis Date   Anemia    Aortic insufficiency    with bicuspid aortic valve (the last MRI demonstrated the aortic root to be 4.8 x 4.7 cm.) Mod regurgitation. Normal chamber size with very mild left ventricular dysfunction.)   Blood transfusion    CHF (congestive heart failure) (HCC)    Clotting disorder (HCC)    Cough 06/28/2009   Dr. 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   Pericardial effusion 07/2009   admission to [facility], s/p pericardial window   Pleural effusion  10/2009   s/p thoracentesis   PNA (pneumonia) 10/2009   admnission to [facility], with right pleural effusion, s/p thoracentesis   Sleep apnea    Syncope 08/2009   admission for fever and syncope, no source for fever seen on cultures. Syncope thought to be r/t orthostasis    Medications:  Per Anti-coag clinic visit note from 07/04/22 patient has had an aortic valve replacement and Afib.  INR goal of 2-3.  Dose at that visit was Warfarin 2 mg Mon/Fri and 4 mg AOD.  Confirmed with ED nurse that patient has not had dose today 07/22/22.  Assessment: 72 yo male with PMH including AVR and Afib. Patient presented to ED with palpitations and chest pain.  Patient found to be in Afib.  Pharmacy consulted for warfarin resumption and dosing.  Interacting medications: Amiodarone  Goal of Therapy:  INR 2-3 Monitor platelets by anticoagulation protocol: Yes   Date INR Warfarin Dose  6/24 2.8 2 mg  6/25 2.7 4 mg     Plan:  INR is therapeutic Will give warfarin 4 mg x 1 (home regimen) tonight.  Daily INR. CBC at least every 3 days. New start amiodarone, If continued at discharge warfarin dose may need to be adjusted. Ideally with a AVR I will aim for a INR of 2.5 - 3. Possible cardioversion 6/26.   Ronnald Ramp, PharmD 07/23/2022,9:30 AM

## 2022-07-23 NOTE — ED Notes (Signed)
First encounter with pt: Pt denies CP, Skin dry, RR even and unlabored.

## 2022-07-24 ENCOUNTER — Inpatient Hospital Stay: Payer: Medicare PPO | Admitting: Certified Registered Nurse Anesthetist

## 2022-07-24 ENCOUNTER — Encounter
Admission: EM | Disposition: A | Discharge status: Home / Self Care | Payer: Self-pay | Source: Home / Self Care | Attending: Internal Medicine

## 2022-07-24 DIAGNOSIS — E119 Type 2 diabetes mellitus without complications: Secondary | ICD-10-CM | POA: Diagnosis not present

## 2022-07-24 DIAGNOSIS — I5023 Acute on chronic systolic (congestive) heart failure: Secondary | ICD-10-CM

## 2022-07-24 DIAGNOSIS — I4892 Unspecified atrial flutter: Secondary | ICD-10-CM

## 2022-07-24 DIAGNOSIS — Z952 Presence of prosthetic heart valve: Secondary | ICD-10-CM | POA: Diagnosis not present

## 2022-07-24 DIAGNOSIS — I4891 Unspecified atrial fibrillation: Secondary | ICD-10-CM

## 2022-07-24 DIAGNOSIS — R072 Precordial pain: Secondary | ICD-10-CM | POA: Diagnosis not present

## 2022-07-24 HISTORY — PX: CARDIOVERSION: SHX1299

## 2022-07-24 LAB — BASIC METABOLIC PANEL
Anion gap: 9 (ref 5–15)
BUN: 19 mg/dL (ref 8–23)
CO2: 22 mmol/L (ref 22–32)
Calcium: 8.5 mg/dL — ABNORMAL LOW (ref 8.9–10.3)
Chloride: 104 mmol/L (ref 98–111)
Creatinine, Ser: 1.08 mg/dL (ref 0.61–1.24)
GFR, Estimated: >60 mL/min (ref >60)
Glucose, Bld: 180 mg/dL — ABNORMAL HIGH (ref 70–99)
Potassium: 3.8 mmol/L (ref 3.5–5.1)
Sodium: 135 mmol/L (ref 135–145)

## 2022-07-24 LAB — GLUCOSE, CAPILLARY
Glucose-Capillary: 190 mg/dL — ABNORMAL HIGH (ref 70–99)
Glucose-Capillary: 192 mg/dL — ABNORMAL HIGH (ref 70–99)
Glucose-Capillary: 149 mg/dL — ABNORMAL HIGH (ref 70–99)
Glucose-Capillary: 186 mg/dL — ABNORMAL HIGH (ref 70–99)
Glucose-Capillary: 149 mg/dL — ABNORMAL HIGH (ref 70–99)

## 2022-07-24 LAB — CBC
HCT: 44 % (ref 39.0–52.0)
Hemoglobin: 15 g/dL (ref 13.0–17.0)
MCH: 30.7 pg (ref 26.0–34.0)
MCHC: 34.1 g/dL (ref 30.0–36.0)
MCV: 90.2 fL (ref 80.0–100.0)
Platelets: 146 10*3/uL — ABNORMAL LOW (ref 150–400)
RBC: 4.88 MIL/uL (ref 4.22–5.81)
RDW: 14.3 % (ref 11.5–15.5)
WBC: 5.6 10*3/uL (ref 4.0–10.5)
nRBC: 0 % (ref 0.0–0.2)

## 2022-07-24 LAB — PROTIME-INR
INR: 2.5 — ABNORMAL HIGH (ref 0.8–1.2)
Prothrombin Time: 27 seconds — ABNORMAL HIGH (ref 11.4–15.2)

## 2022-07-24 SURGERY — CARDIOVERSION
Anesthesia: General

## 2022-07-24 MED ORDER — FUROSEMIDE 10 MG/ML IJ SOLN
40.0000 mg | Freq: Two times a day (BID) | INTRAMUSCULAR | Status: AC
  Administered 2022-07-24 (×2): 40 mg via INTRAVENOUS
  Filled 2022-07-24 (×2): qty 4

## 2022-07-24 MED ORDER — WARFARIN SODIUM 4 MG PO TABS
4.0000 mg | ORAL_TABLET | Freq: Once | ORAL | Status: AC
  Administered 2022-07-24: 4 mg via ORAL
  Filled 2022-07-24: qty 1

## 2022-07-24 MED ORDER — SPIRONOLACTONE 12.5 MG HALF TABLET
12.5000 mg | ORAL_TABLET | Freq: Every day | ORAL | Status: DC
  Administered 2022-07-24 – 2022-07-25 (×2): 12.5 mg via ORAL
  Filled 2022-07-24 (×2): qty 1

## 2022-07-24 MED ORDER — SODIUM CHLORIDE 0.9 % IV SOLN: INTRAVENOUS | Status: DC

## 2022-07-24 MED ORDER — HYALURONIDASE HUMAN 150 UNIT/ML IJ SOLN
150.0000 [IU] | Freq: Once | INTRAMUSCULAR | Status: AC
  Administered 2022-07-24: 150 [IU] via SUBCUTANEOUS
  Filled 2022-07-24: qty 1

## 2022-07-24 MED ORDER — PROPOFOL 10 MG/ML IV BOLUS
INTRAVENOUS | Status: DC | PRN
  Administered 2022-07-24: 50 mg via INTRAVENOUS
  Administered 2022-07-24: 20 mg via INTRAVENOUS

## 2022-07-24 MED ORDER — EMPAGLIFLOZIN 10 MG PO TABS
10.0000 mg | ORAL_TABLET | Freq: Every day | ORAL | Status: DC
  Administered 2022-07-24 – 2022-07-25 (×2): 10 mg via ORAL
  Filled 2022-07-24 (×2): qty 1

## 2022-07-24 MED ORDER — POTASSIUM CHLORIDE CRYS ER 20 MEQ PO TBCR
40.0000 meq | EXTENDED_RELEASE_TABLET | Freq: Once | ORAL | Status: AC
  Administered 2022-07-24: 40 meq via ORAL
  Filled 2022-07-24: qty 2

## 2022-07-24 NOTE — Progress Notes (Addendum)
Patient ID: [name] [name], male   DOB: 01-30-1950, 72 y.o.   MRN: [id number]    Advanced Heart Failure Team Consult Note   Primary Physician: Joaquim Nam, MD PCP-Cardiologist:  Rollene Rotunda, MD  Reason for Consultation: CHF  HPI:    [name] [name] is seen today for evaluation of CHF/atrial fibrillation at the request of Dr. End.   72 y.o. with history of bicuspid aortic valve s/p St Jude mechanical AVR, paroxysmal atrial fibrillation, heart failure with mid range EF, LBBB was admitted with what appears to be atypical atrial flutter with RVR.  Patient had St Jude mechanical AVR in 2011.  He takes warfarin and has had a therapeutic INR x months. Last echo in 1/24 showed EF 45-50%.  He has had atrial fibrillation in the past but generally has been in NSR.  Patient noted palpitations for about 1 week prior to admission.  He checked his HR and found it to be in the 130s.  He went to see his PCP and was in atrial fibrillation vs flutter with RVR and was sent to ER.  He denies significant chest pain.  He noted increased fatigue but was not particular short of breath. In the ER, he was in what appeared to be atypical flutter with RVR.  Amiodarone gtt was started.  CXR showed evidence for CHF.  Mildly elevated HS-TnI with no trend.   This morning, he remain in atypical atrial flutter with rate 110s.  BP stable.  No chest pain or dyspnea at rest. Echo was done showing EF 25-30%, moderate LV dilation with global HK, moderate LVH, mildly decreased RV systolic function, normally functioning mechanical AoV.  Review of Systems: All systems reviewed and negative except as per HPI.   Home Medications Prior to Admission medications   Medication Sig Start Date End Date Taking? Authorizing Provider  ACCU-CHEK AVIVA PLUS test strip USE AS DIRECTED TO CHECK BLOOD SUGAR 2 TO 4 TIMES A DAY 09/29/20  Yes Joaquim Nam, MD  Accu-Chek FastClix Lancets MISC USE AS DIRECTED TO CHECK BLOOD SUGAR TWICE  DAILY 09/29/20  Yes Joaquim Nam, MD  amoxicillin (AMOXIL) 500 MG tablet Take 4 tablets (2,000 mg total) by mouth daily as needed. Before dental visits. Dispense 5 courses 04/04/22  Yes Joaquim Nam, MD  azelastine (ASTELIN) 0.1 % nasal spray Place 2 sprays into both nostrils 2 (two) times daily as needed for rhinitis. Use in each nostril as directed 09/29/20  Yes Joaquim Nam, MD  carvedilol (COREG) 6.25 MG tablet TAKE ONE TABLET BY MOUTH TWICE A DAY. KEEP OFFICE VISIT 04/08/22  Yes Rollene Rotunda, MD  cetirizine (ZYRTEC) 10 MG tablet Take 10 mg by mouth daily.   Yes [provider]  empagliflozin (JARDIANCE) 10 MG TABS tablet Take 1 tablet (10 mg total) by mouth daily before breakfast. 01/17/22  Yes Rollene Rotunda, MD  fluocinonide cream (LIDEX) 0.05 % APPLY 1 APPLICATION TO AFFECTEDE AREAS 2 TIMES DAILY AS NEEDED 06/27/20  Yes Joaquim Nam, MD  glipiZIDE (GLUCOTROL) 5 MG tablet TAKE 2 TABLETS BY MOUTH DAILY BEFORE BREAKFAST IF AM SUGAR IS ABOVE 150 Patient taking differently: Take 5-10 mg by mouth See admin instructions. Take 1 tablet (5mg ) by mouth every morning before breakfast - take 2 tablets (10mg ) by mouth before breakfast if BG >150 11/12/21  Yes Joaquim Nam, MD  Lancets Misc. (ACCU-CHEK FASTCLIX LANCET) KIT AS DIRECTED 05/23/17  Yes Joaquim Nam, MD  losartan (COZAAR) 100  MG tablet TAKE 1 TABLET BY MOUTH ONCE A DAY (PLEASE SCHEDULE APPT) 01/28/22  Yes Rollene Rotunda, MD  metFORMIN (GLUCOPHAGE) 500 MG tablet Take 2 tablets (1,000 mg total) by mouth 2 (two) times daily. 01/03/22  Yes Joaquim Nam, MD  omeprazole (PRILOSEC) 20 MG capsule TAKE ONE CAPSULE BY MOUTH ONCE DAILY Patient taking differently: Take 20 mg by mouth daily before breakfast. 05/14/22  Yes Joaquim Nam, MD  potassium chloride (KLOR-CON) 10 MEQ tablet TAKE ONE TABLET BY MOUTH TWICE A DAY 05/28/22  Yes Joaquim Nam, MD  pravastatin (PRAVACHOL) 20 MG tablet TAKE ONE TABLET BY MOUTH ONCE A DAY  04/15/22  Yes Joaquim Nam, MD  psyllium (METAMUCIL) 58.6 % packet Take 1 packet by mouth daily.   Yes [provider]  tamsulosin (FLOMAX) 0.4 MG CAPS capsule Take 1 capsule (0.4 mg total) by mouth daily. Patient taking differently: Take 0.4 mg by mouth daily before breakfast. 10/04/21  Yes Joaquim Nam, MD  WARFARIN SODIUM PO Take 2-4 mg by mouth See admin instructions. Take 4mg  by mouth every Tuesday, Wednesday, Thursday, Saturday and Sunday night and take 2mg  by mouth every Monday and Friday night   Yes [provider]  furosemide (LASIX) 20 MG tablet TAKE 1 TABLET BY MOUTH ONCE DAILY Patient not taking: Reported on 07/22/2022 08/13/21   Joaquim Nam, MD  warfarin (COUMADIN) 1 MG tablet TAKE ONE TABLET ON SUNDAY, TUESDAY, WEDNESDAY, THURSDAY AND SATURDAY AND TAKE 2 TABLET ON MONDAYS AND FRIDAYS OR AS DIRECTED BY ANTICOAGULATION CLINIC 06/18/22   Joaquim Nam, MD  warfarin (COUMADIN) 3 MG tablet TAKE 1 TABLET BY MOUTH ON SUNDAYS, TUESDAYS, WEDNESDAYS, THURSDAYS AND SATURDAYS OR AS DIRECTED BY ANTICOAGULATION CINIC 11/02/21   Joaquim Nam, MD  warfarin (COUMADIN) 3 MG tablet TAKE ONE 3 MG TABLET BY MOUTH DAILY AND ONE 1 MG TABLET BY MOUTH DAILY (4 MG TOTAL) EXCEPT TAKE TWO 1 MG TABLETS (2 MG TOTAL) ON FRIDAYS OR AS DIRECTED BY ANTICOAGULATION CLINIC. 10/29/21   Joaquim Nam, MD  warfarin (COUMADIN) 3 MG tablet TAKE ONE TABLET BY MOUTH ON SUNDAY, TUESDAY, WEDNESDAY, THURSDAY, AND SATURDAY OR AS DIRECTED BY ANTICOAGULATION CLINIC 06/18/22   Joaquim Nam, MD    Past Medical History: Past Medical History:  Diagnosis Date   Anemia    Aortic insufficiency    with bicuspid aortic valve (the last MRI demonstrated the aortic root to be 4.8 x 4.7 cm.) Mod regurgitation. Normal chamber size with very mild left ventricular dysfunction.)   Blood transfusion    CHF (congestive heart failure) (HCC)    Clotting disorder (HCC)    Cough 06/28/2009   Dr. Sherene Sires   Diabetes  mellitus without complication (HCC)    Duodenal ulcer    with GI bleeding   Dyslipidemia    GERD (gastroesophageal reflux disease) 03/23/2001   egd w/ dilation...Marland KitchenMarland KitchenKaplan   Hemorrhoids    Hyperlipidemia    Hypertension    Necrotizing pancreatitis 09/2009   admission, and pseudocyst formation, also with ATN   Orthostasis 08/2009   Fever and syncope, no source for fever on cultures.   Pericardial effusion 07/2009   admission to [facility], s/p pericardial window   Pleural effusion 10/2009   s/p thoracentesis   PNA (pneumonia) 10/2009   admnission to [facility], with right pleural effusion, s/p thoracentesis   Sleep apnea    Syncope 08/2009   admission for fever and syncope, no source for fever seen on cultures.  Syncope thought to be r/t orthostasis    Past Surgical History: Past Surgical History:  Procedure Laterality Date   CARDIAC VALVE REPLACEMENT  07/10/2009   AVR   Cardiolite EKG  03/10/2003   (-) ? small/apical infarct EF 50%   DOPPLER ECHOCARDIOGRAPHY  01/18/97   LVH, mild dilated aorta - root mod to severe aortic regurg EF 50-55%   DOPPLER ECHOCARDIOGRAPHY  09/27/03   No changes, moderate severe aortic regurg   DOPPLER ECHOCARDIOGRAPHY  07/09/2004   Mod severe aortic regurg 2-3+ T.R., T.I.R., mild P..R.   DOPPLER ECHOCARDIOGRAPHY  04/01/06   Hypokin Apex EF 55%   DOPPLER ECHOCARDIOGRAPHY  02/15/2010   Mild LVH, mild decr Sys fctn EF 40-45% Mild Dias Dysfctn Mech AV Triv AR, MR   ESOPHAGOGASTRODUODENOSCOPY  1990, 1999   Hot dog in throat (1987)/ Chicken in throat (1990)/ 01/06/97 (Dr. Kinnie Scales) Impaction, dilatation, Schatzki's Ring/ 07/1998 repeat Dilatation/ 01/10/01 stricture/dilated/ 03/08/05 Schatzki's Ring, dilated/ 06/01/08 Food Disimpaction [name] Bulb Ulcer (Dr. Ewing Schlein)   ESOPHAGOGASTRODUODENOSCOPY  01/06/1997   (Dr. Kinnie Scales) Impaction, dilatation, Schatzki's Ring   ESOPHAGOGASTRODUODENOSCOPY  08/09/98   Repeat dilatation [name])   ESOPHAGOGASTRODUODENOSCOPY  01/13/2001    stricture/ HH, duodenal ulcer / bleeding Marina Goodell)   ESOPHAGOGASTRODUODENOSCOPY  12/25/01   esophagus stricture, dilated   ESOPHAGOGASTRODUODENOSCOPY  03/23/2001   stricture, dilated   ESOPHAGOGASTRODUODENOSCOPY  03/08/05   Schatzki's Ring   03/13/05  Schatzki's Ring - dilated, duodenitis   ESOPHAGOGASTRODUODENOSCOPY  06/11/08   with food disimpaction.  Food impaction Ms HH  Sm bulb ulcer (Dr. Ewing Schlein)   ESOPHAGOGASTRODUODENOSCOPY     stricture/HH, duodenal ulcer/bleeding Marina Goodell)   ESOPHAGOGASTRODUODENOSCOPY N/A 01/23/2016   Procedure: ESOPHAGOGASTRODUODENOSCOPY (EGD);  Surgeon: Midge Minium, MD;  Location: [facility] ENDOSCOPY;  Service: Endoscopy;  Laterality: N/A;   ESOPHAGOGASTRODUODENOSCOPY N/A 03/10/2021   Procedure: ESOPHAGOGASTRODUODENOSCOPY (EGD);  Surgeon: Jenel Lucks, MD;  Location: [facility] ENDOSCOPY;  Service: Gastroenterology;  Laterality: N/A;   ESOPHAGOGASTRODUODENOSCOPY (EGD) WITH PROPOFOL N/A 02/06/2016   Procedure: ESOPHAGOGASTRODUODENOSCOPY (EGD) WITH PROPOFOL;  Surgeon: Midge Minium, MD;  Location: [facility] ENDOSCOPY;  Service: Endoscopy;  Laterality: N/A;   FOREIGN BODY REMOVAL  03/10/2021   Procedure: FOREIGN BODY REMOVAL;  Surgeon: Jenel Lucks, MD;  Location: [facility] ENDOSCOPY;  Service: Gastroenterology;;   HERNIA REPAIR  2011   abdominal; post AVR   MRI  2/05   LVH global hypokin EF 48% Mod A.I.   VALVE REPLACEMENT  06/2009   St. Jude mechanical valve per Dr. Laneta Simmers    Family History: Family History  Problem Relation Age of Onset   Obesity Mother        bedridden   Hypertension Mother    Heart disease Father        CABG, CAD   Hypertension Father    Lymphoma Father    Cancer Father        lymphoma   Alcohol abuse Brother    Cancer Brother        melanoma   Colon cancer Paternal Aunt    Stomach cancer Neg Hx    Rectal cancer Neg Hx    Prostate cancer Neg Hx     Social History: Social History   Socioeconomic History   Marital status: Married    Spouse name:  Not on file   Number of children: 2   Years of education: Not on file   Highest education level: Not on file  Occupational History   Occupation: mech. supervisor    Employer: [employer]    Comment[place]  Tobacco Use   Smoking status: Never   Smokeless tobacco: Never  Vaping Use   Vaping Use: Never used  Substance and Sexual Activity   Alcohol use: No    Alcohol/week: 0.0 standard drinks of alcohol   Drug use: No   Sexual activity: Not on file  Other Topics Concern   Not on file  Social History Narrative   Married 1996   Social Determinants of Health   Financial Resource Strain: Low Risk  (01/02/2022)   Overall Financial Resource Strain (CARDIA)    Difficulty of Paying Living Expenses: Not hard at all  Food Insecurity: No Food Insecurity (07/23/2022)   Hunger Vital Sign    Worried About Running Out of Food in the Last Year: Never true    Ran Out of Food in the Last Year: Never true  Transportation Needs: No Transportation Needs (07/23/2022)   PRAPARE - Administrator, Civil Service (Medical): No    Lack of Transportation (Non-Medical): No  Physical Activity: Inactive (12/16/2018)   Exercise Vital Sign    Days of Exercise per Week: 0 days    Minutes of Exercise per Session: 0 min  Stress: No Stress Concern Present (01/02/2022)   [employer] of Occupational Health - Occupational Stress Questionnaire    Feeling of Stress : Not at all  Social Connections: Unknown (01/02/2022)   Social Connection and Isolation Panel [NHANES]    Frequency of Communication with Friends and Family: Not on file    Frequency of Social Gatherings with Friends and Family: Not on file    Attends Religious Services: Not on file    Active Member of Clubs or Organizations: Not on file    Attends Banker Meetings: Not on file    Marital Status: Married    Allergies:  Allergies  Allergen Reactions   Ace Inhibitors     REACTION: cough   Aspirin Other (See  Comments)    Gastritis.    Other     dexcom patch- local rash- presumed adhesive allergy.     Primaxin [Imipenem W/Cilastatin Sodium]     Rash- but able to take amoxil w/o troubles.    Tape     Rash from bandaids.      Objective:    Vital Signs:   Temp:  [98 F (36.7 C)-98.5 F (36.9 C)] 98 F (36.7 C) (06/26 0758) Pulse Rate:  [104-116] 115 (06/26 0758) Resp:  [18-26] 18 (06/26 0758) BP: (97-129)/(68-93) 114/75 (06/26 0758) SpO2:  [91 %-96 %] 95 % (06/26 0758) Last BM Date : 07/23/22  Weight change: Filed Weights   07/22/22 1550  Weight: 106.1 kg    Intake/Output:   Intake/Output Summary (Last 24 hours) at 07/24/2022 1018 Last data filed at 07/24/2022 0219 Gross per 24 hour  Intake 649.16 ml  Output 700 ml  Net -50.84 ml      Physical Exam    General:  Well appearing. No resp difficulty HEENT: normal Neck: Thick. JVP 8-9 cm. Carotids 2+ bilat; no bruits. No lymphadenopathy or thyromegaly appreciated. Cor: PMI nondisplaced. Irregular rate & rhythm. Mechanical S2. 2/6 early SEM RUSB. Lungs: clear Abdomen: soft, nontender, nondistended. No hepatosplenomegaly. No bruits or masses. Good bowel sounds. Extremities: no cyanosis, clubbing, rash, trace ankle edema.  Neuro: alert & orientedx3, cranial nerves grossly intact. moves all 4 extremities w/o difficulty. Affect pleasant   Telemetry   Suspect atypical flutter (personally reviewed)  EKG  Atypical flutter, LBBB 146 msec (personally reviewed)  Labs   Basic Metabolic Panel: Recent Labs  Lab 07/22/22 1555 07/22/22 1755 07/24/22 0730  NA 138  --  135  K 4.6  --  3.8  CL 106  --  104  CO2 24  --  22  GLUCOSE 213*  --  180*  BUN 16  --  19  CREATININE 1.22  --  1.08  CALCIUM 9.0  --  8.5*  MG  --  2.1  --     Liver Function Tests: Recent Labs  Lab 07/22/22 1555  AST 28  ALT 22  ALKPHOS 78  BILITOT 1.1  PROT 6.9  ALBUMIN 3.9   No results for input(s): "LIPASE", "AMYLASE" in the last 168  hours. No results for input(s): "AMMONIA" in the last 168 hours.  CBC: Recent Labs  Lab 07/22/22 1555 07/24/22 0730  WBC 7.7 5.6  HGB 14.9 15.0  HCT 43.9 44.0  MCV 92.0 90.2  PLT 149* 146*    Cardiac Enzymes: No results for input(s): "CKTOTAL", "CKMB", "CKMBINDEX", "TROPONINI" in the last 168 hours.  BNP: BNP (last 3 results) Recent Labs    07/22/22 1842  BNP 244.9*    ProBNP (last 3 results) No results for input(s): "PROBNP" in the last 8760 hours.   CBG: Recent Labs  Lab 07/23/22 1116 07/23/22 1630 07/23/22 2123 07/24/22 0430 07/24/22 0759  GLUCAP 210* 150* 137* 190* 192*    Coagulation Studies: Recent Labs    07/22/22 1836 07/23/22 0510 07/24/22 0458  LABPROT 29.6* 29.3* 27.0*  INR 2.8* 2.7* 2.5*     Imaging   ECHOCARDIOGRAM COMPLETE  Result Date: 07/23/2022    ECHOCARDIOGRAM REPORT   Patient Name:   [name] [name] Date of Exam: 07/23/2022 Medical Rec #:  [id number]          Height:       71.0 in Accession #:    [id number]         Weight:       234.0 lb Date of Birth:  Mar 09, 1950          BSA:          2.254 m Patient Age:    72 years           BP:           128/87 mmHg Patient Gender: M                  HR:           117 bpm. Exam Location:  [facility] Procedure: 2D Echo Indications:     Atrial fibrillation                  Chest pain  History:         Patient has prior history of Echocardiogram examinations, most                  recent 02/16/2021. CHF, Aortic Valve Disease, Arrythmias:Atrial                  Fibrillation; Signs/Symptoms:Chest Pain and palpitations.                  Aortic Valve: 27 mm St. Jude bileaflet valve is present in the                  aortic position.  Sonographer:     Mikki Harbor Referring Phys:  [phone] EKTA V PATEL  Diagnosing Phys: Yvonne Kendall MD IMPRESSIONS  1. Left ventricular ejection fraction, by estimation, is 25 to 30%. The left ventricle has severely decreased function. The left ventricle demonstrates global  hypokinesis. The left ventricular internal cavity size was moderately dilated. There is moderate left ventricular hypertrophy. Left ventricular diastolic function could not be evaluated.  2. Right ventricular systolic function is mildly reduced. The right ventricular size is normal. There is normal pulmonary artery systolic pressure.  3. The mitral valve is normal in structure. Mild mitral valve regurgitation.  4. The aortic valve has been repaired/replaced. Aortic valve regurgitation is not visualized. There is a 27 mm St. Jude bileaflet valve present in the aortic position. Echo findings are consistent with normal structure and function of the aortic valve prosthesis. Aortic valve mean gradient measures 5.0 mmHg.  5. Aortic root/ascending aorta has been repaired/replaced.  6. The inferior vena cava is normal in size with greater than 50% respiratory variability, suggesting right atrial pressure of 3 mmHg. FINDINGS  Left Ventricle: Left ventricular ejection fraction, by estimation, is 25 to 30%. The left ventricle has severely decreased function. The left ventricle demonstrates global hypokinesis. The left ventricular internal cavity size was moderately dilated. There is moderate left ventricular hypertrophy. Abnormal (paradoxical) septal motion, consistent with left bundle branch block. Left ventricular diastolic function could not be evaluated due to atrial fibrillation. Left ventricular diastolic function could not be evaluated. Right Ventricle: The right ventricular size is normal. No increase in right ventricular wall thickness. Right ventricular systolic function is mildly reduced. There is normal pulmonary artery systolic pressure. The tricuspid regurgitant velocity is 2.20 m/s, and with an assumed right atrial pressure of 3 mmHg, the estimated right ventricular systolic pressure is 22.4 mmHg. Left Atrium: Left atrial size was normal in size. Right Atrium: Right atrial size was normal in size. Pericardium:  There is no evidence of pericardial effusion. Mitral Valve: The mitral valve is normal in structure. Mild mitral valve regurgitation. MV peak gradient, 5.2 mmHg. The mean mitral valve gradient is 2.0 mmHg. Tricuspid Valve: The tricuspid valve is not well visualized. Tricuspid valve regurgitation is trivial. Aortic Valve: The aortic valve has been repaired/replaced. Aortic valve regurgitation is not visualized. Aortic valve mean gradient measures 5.0 mmHg. Aortic valve peak gradient measures 9.8 mmHg. Aortic valve area, by VTI measures 2.95 cm. There is a 27 mm St. Jude bileaflet valve present in the aortic position. Echo findings are consistent with normal structure and function of the aortic valve prosthesis. Pulmonic Valve: The pulmonic valve was not well visualized. Pulmonic valve regurgitation is not visualized. No evidence of pulmonic stenosis. Aorta: The aortic root/ascending aorta has been repaired/replaced. Pulmonary Artery: The pulmonary artery is of normal size. Venous: The inferior vena cava is normal in size with greater than 50% respiratory variability, suggesting right atrial pressure of 3 mmHg. IAS/Shunts: No atrial level shunt detected by color flow Doppler.  LEFT VENTRICLE PLAX 2D LVIDd:         6.20 cm LVIDs:         4.30 cm LV PW:         1.40 cm LV IVS:        1.40 cm LVOT diam:     2.10 cm LV SV:         78 LV SV Index:   35 LVOT Area:     3.46 cm  LV Volumes (MOD) LV vol d, MOD A2C: 120.5 ml LV vol d, MOD A4C: 113.0 ml LV vol  s, MOD A2C: 101.3 ml LV vol s, MOD A4C: 112.6 ml LV SV MOD A2C:     19.1 ml LV SV MOD A4C:     113.0 ml LV SV MOD BP:      0.0 ml RIGHT VENTRICLE RV Basal diam:  3.65 cm RV Mid diam:    3.50 cm RV S prime:     8.05 cm/s TAPSE (M-mode): 1.6 cm LEFT ATRIUM             Index        RIGHT ATRIUM           Index LA diam:        4.50 cm 2.00 cm/m   RA Area:     16.60 cm LA Vol (A2C):   65.3 ml 28.97 ml/m  RA Volume:   44.00 ml  19.52 ml/m LA Vol (A4C):   47.0 ml 20.85  ml/m LA Biplane Vol: 58.4 ml 25.91 ml/m  AORTIC VALVE                     PULMONIC VALVE AV Area (Vmax):    3.04 cm      PV Vmax:       1.03 m/s AV Area (Vmean):   2.95 cm      PV Peak grad:  4.3 mmHg AV Area (VTI):     2.95 cm AV Vmax:           156.33 cm/s AV Vmean:          104.000 cm/s AV VTI:            0.265 m AV Peak Grad:      9.8 mmHg AV Mean Grad:      5.0 mmHg LVOT Vmax:         137.00 cm/s LVOT Vmean:        88.467 cm/s LVOT VTI:          0.225 m LVOT/AV VTI ratio: 0.85  AORTA Ao Root diam: 2.70 cm Ao Asc diam:  3.10 cm MITRAL VALVE               TRICUSPID VALVE MV Area (PHT): 4.49 cm    TR Peak grad:   19.4 mmHg MV Area VTI:   4.38 cm    TR Vmax:        220.00 cm/s MV Peak grad:  5.2 mmHg MV Mean grad:  2.0 mmHg    SHUNTS MV Vmax:       1.14 m/s    Systemic VTI:  0.23 m MV Vmean:      54.1 cm/s   Systemic Diam: 2.10 cm MV Decel Time: 169 msec MV E velocity: 73.90 cm/s Cristal Deer End MD Electronically signed by Yvonne Kendall MD Signature Date/Time: 07/23/2022/5:43:04 PM    Final      Medications:     Current Medications:  carvedilol  3.125 mg Oral BID WC   empagliflozin  10 mg Oral Daily   furosemide  40 mg Intravenous BID   insulin aspart  0-15 Units Subcutaneous TID WC   pantoprazole (PROTONIX) IV  40 mg Intravenous Q12H   potassium chloride  40 mEq Oral Once   pravastatin  20 mg Oral Daily   sodium chloride flush  3 mL Intravenous Q12H   spironolactone  12.5 mg Oral Daily   tamsulosin  0.4 mg Oral Daily   warfarin  4 mg Oral ONCE-1600   Warfarin - Pharmacist Dosing Inpatient  Does not apply q1600    Infusions:  sodium chloride     amiodarone 30 mg/hr (07/24/22 1610)     Assessment/Plan   1. Atypical atrial flutter: With RVR. Patient has history of PAF but has generally been in NSR in the past.  HR 100s today on amiodarone 30 mg/hr.  He is on warfarin, INR has been therapeutic x months.  Atrial flutter with RVR may have contributed to worsening of EF as a  tachycardia-mediated CMP.  We will need to get him back into NSR.  - DCCV today.  - Continue amiodarone IV overnight after cardioversion then to po.  - Continue warfarin.  - Would favor evaluation for atrial fibrillation/flutter ablation in the future.  2. Acute on chronic systolic CHF: Prior echo in 1/24 with EF 45-50%.  Has baseline LBBB.  Echo this admission with EF 25-30%, moderate LV dilation with global HK, moderate LVH, mildly decreased RV systolic function, normally functioning mechanical AoV. It is possible that drop in EF is tachycardia-mediated with atypical atrial flutter/RVR of uncertain duration.  He is mildly volume overloaded on exam.  - Lasix 40 mg IV bid x 2 doses today and replace K.  - Continue Coreg 3.125 mg bid.  - Add back Jardiance 10 mg daily.  - Start spironolactone 12.5 daily.  - Will follow BP and creatinine, would like to start [facility] before discharge.  - Will need repeat echo a month or so after DCCV to NSR.  If EF improves back to baseline, likely tachycardia-mediated.  If not, would favor cath (would have to bridge with Lovenox off warfarin given mechanical AoV).  3. St Jude mechanical aortic valve: h/o bicuspid aortic valve with AS.  Valve functioned normally on echo this admission.  - Continue warfarin.  4. Suspect OSA: He snores, has thick neck. Would recommend outpatient sleep study.  Suspect OSA contributes to atrial arrhythmias.  5. Ascending aortic aneurysm: Has history of dilated ascending aorta in setting of bicuspid aortic valve.  Will need some form of thoracic aorta evaluation, can do as outpatient.   Marca Ancona 07/24/2022 10:33 AM   Length of Stay: 2  Marca Ancona, MD  07/24/2022, 10:18 AM  Advanced Heart Failure Team Pager [phone] (M-F; 7a - 5p)  Please contact [facility] for night-coverage after hours (4p -7a ) and weekends on [email or web address]

## 2022-07-24 NOTE — Interval H&P Note (Signed)
History and Physical Interval Note:  07/24/2022 12:09 PM  [name] [name]  has presented today for surgery, with the diagnosis of Atrial fibrillation.  The various methods of treatment have been discussed with the patient and family. After consideration of risks, benefits and other options for treatment, the patient has consented to  Procedure(s): CARDIOVERSION (N/A) as a surgical intervention.  The patient's history has been reviewed, patient examined, no change in status, stable for surgery.  I have reviewed the patient's chart and labs.  Questions were answered to the patient's satisfaction.     [place] Rosamond [employer]

## 2022-07-24 NOTE — Anesthesia Postprocedure Evaluation (Signed)
Anesthesia Post Note  Patient: [name] [name]  Procedure(s) Performed: CARDIOVERSION  Patient location during evaluation: PACU Anesthesia Type: General Level of consciousness: awake and alert Pain management: pain level controlled Vital Signs Assessment: post-procedure vital signs reviewed and stable Respiratory status: spontaneous breathing, nonlabored ventilation and respiratory function stable Cardiovascular status: blood pressure returned to baseline and stable Postop Assessment: no apparent nausea or vomiting Anesthetic complications: no   No notable events documented.   Last Vitals:  Vitals:   07/24/22 1328 07/24/22 1330  BP: 121/82 113/69  Pulse: 64 65  Resp: 18 19  Temp:    SpO2: 95% 94%    Last Pain:  Vitals:   07/24/22 1330  TempSrc:   PainSc: 0-No pain                 Foye Deer

## 2022-07-24 NOTE — Progress Notes (Signed)
Progress Note   Patient: [name] [name] ZOX:[id number] DOB: [birth year] DOA: 07/22/2022     2 DOS: the patient was seen and examined on 07/24/2022   Brief hospital course:  KRAIG GENIS is a 72 y.o. male with medical history significant for aortic insufficiency, bicuspid aortic valve status post [facility] Jude mechanical valve on Coumadin and need for endocarditis prophylaxis, pt sees [facility] cardiology- 2011, diabetes mellitus type 2, anemia with a history of duodenal ulcer and GI bleed, hypertension, necrotizing pancreatitis, pericardial effusion, pneumonia, sleep apnea, hypertension, HFrEF with last EF of 45 to 50%, obesity coming to the hospital for palpitations and chest discomfort.   He was found to be in A-fib with RVR.  Started on amiodarone infusion. Cardiology was consulted.  6/25: Heart rate improved but remained in low 100, BNP 244, INR 2.7.  TSH normal with mildly elevated free T4 at 1.16.  Troponin 71>>70. Cardiology is planning cardioversion tomorrow. Echocardiogram done-pending results.  6/26: Successful cardioversion.  EF of 25 to 30% with global hypokinesis.  Normal structure and function of aortic valve prosthesis.  Advanced heart failure team is on board   Assessment and Plan: * Atrial fibrillation with rapid ventricular response (HCC) Pt presenting with Palpitations/A-fib RVR Started on amiodarone infusion- -Patient underwent successful cardioversion today -Continue with amiodarone infusion -Continue with Coumadin   Chest pain  Substernal Chest pain presentation.  Mild troponin elevation secondary to demand ischemia with increased heart rate and A-fib RVR. Cardiology consulted. No st t wave changes.  .  Nitroglycerin, as needed morphine, IV PPI therapy. -Echocardiogram done-pending results  Dilated cardiomyopathy (HCC) Further decrease in EF to 20 to 25% on repeat echo.  It was 40 to 45% a year ago. Likely secondary to A-fib with RVR Advanced heart failure  team is on board -Started on IV Lasix 40 mg twice daily -Started on Jardiance and spironolactone  S/P AVR Pharmacy consult for coumadin for mechanical valve and a.fib .  Diabetes mellitus, type II (HCC) CBG within goal, A1c of 7.7. Patient was on metformin, glipizide and Jardiance at home -Continue with SSI   Essential hypertension Blood pressures within goal.  Patient said that_syncope and dizziness due to low blood pressure with blood pressure medications Jardiance and intermittent diuretic therapy as needed. Will defer to PCP to adjust her blood pressure medication regimen.  Patient is stable.  Of any dizziness or syncopal symptoms.  Anemia History of anemia/ GIB/ esophageal stenosis: Hemoglobin currently at 14.9 and stable IV PPI therapy.    GERD With h/o GIB. PPI therapy.  Aspiration and fall precaution.   OBSTRUCTIVE SLEEP APNEA Advised pt to obtain oral appliance or find a treatment plan and d/w PCP.   Subjective: Patient with no new concern.  Underwent successful cardioversion.  Physical Exam: Vitals:   07/24/22 1315 07/24/22 1328 07/24/22 1330 07/24/22 1642  BP: 105/67 121/82 113/69 120/73  Pulse: 65 64 65 70  Resp: 20 18 19 18   Temp:    97.9 F (36.6 C)  TempSrc:      SpO2: 91% 95% 94% 95%  Weight:      Height:       General.  Obese gentleman, in no acute distress. Pulmonary.  Lungs clear bilaterally, normal respiratory effort. CV.  Regular rate and rhythm, no JVD, rub or murmur. Abdomen.  Soft, nontender, nondistended, BS positive. CNS.  Alert and oriented .  No focal neurologic deficit. Extremities.  No edema, no cyanosis, pulses intact and symmetrical. Psychiatry.  Judgment and  insight appears normal.   Data Reviewed: Prior data reviewed  Family Communication: Discussed with wife at bedside  Disposition: Status is: Inpatient Remains inpatient appropriate because: Severity of illness  Planned Discharge Destination: Home  DVT prophylaxis.   Coumadin Time spent: 50 minutes  This record has been created using Conservation officer, historic buildings. Errors have been sought and corrected,but may not always be located. Such creation errors do not reflect on the standard of care.   Author: Arnetha Courser, MD 07/24/2022 5:06 PM  For on call review [email or web address].

## 2022-07-24 NOTE — Assessment & Plan Note (Signed)
Further decrease in EF to 20 to 25% on repeat echo.  It was 40 to 45% a year ago. Likely secondary to A-fib with RVR Advanced heart failure team is on board -Started on IV Lasix 40 mg twice daily -Started on Jardiance and spironolactone

## 2022-07-24 NOTE — Anesthesia Procedure Notes (Signed)
Date/Time: 07/24/2022 12:41 PM  Performed by: Malva Cogan, CRNAPre-anesthesia Checklist: Patient identified, Emergency Drugs available, Suction available, Patient being monitored and Timeout performed Patient Re-evaluated:Patient Re-evaluated prior to induction Oxygen Delivery Method: Nasal cannula Induction Type: IV induction Placement Confirmation: CO2 detector and positive ETCO2

## 2022-07-24 NOTE — Progress Notes (Signed)
Skin around PIV in pt's right arm noted to be red, taut and mildly swollen. No blood return, difficult to flush - assumed to infiltrated. IV removed and pharmacy and MD contacted for further advisement. See medication order. In the mean time, arm elevated and applied a warm blanket to pt's R arm per pharmacist recommendation . Receiving floor nurse, Rebekah, made aware and added to conversation with Dr. Nelson Chimes and pharmacists.

## 2022-07-24 NOTE — Consult Note (Signed)
ANTICOAGULATION CONSULT NOTE   Pharmacy Consult for warfarin Indication: AVR and Afib  Allergies  Allergen Reactions   Ace Inhibitors     REACTION: cough   Aspirin Other (See Comments)    Gastritis.    Other     dexcom patch- local rash- presumed adhesive allergy.     Primaxin [Imipenem W/Cilastatin Sodium]     Rash- but able to take amoxil w/o troubles.    Tape     Rash from bandaids.      Patient Measurements: Height: 5\' 11"  (180.3 cm) Weight: 106.1 kg (234 lb) IBW/kg (Calculated) : 75.3   Vital Signs: Temp: 98.5 F (36.9 C) (06/26 0433) Temp Source: Oral (06/26 0433) BP: 111/93 (06/26 0433) Pulse Rate: 116 (06/26 0433)  Labs: Recent Labs    07/22/22 1555 07/22/22 1755 07/22/22 1836 07/23/22 0510 07/24/22 0458  HGB 14.9  --   --   --   --   HCT 43.9  --   --   --   --   PLT 149*  --   --   --   --   LABPROT  --   --  29.6* 29.3* 27.0*  INR  --   --  2.8* 2.7* 2.5*  CREATININE 1.22  --   --   --   --   TROPONINIHS 71* 70*  --   --   --      Estimated Creatinine Clearance: 69.8 mL/min (by C-G formula based on SCr of 1.22 mg/dL).   Medical History: Past Medical History:  Diagnosis Date   Anemia    Aortic insufficiency    with bicuspid aortic valve (the last MRI demonstrated the aortic root to be 4.8 x 4.7 cm.) Mod regurgitation. Normal chamber size with very mild left ventricular dysfunction.)   Blood transfusion    CHF (congestive heart failure) (HCC)    Clotting disorder (HCC)    Cough 06/28/2009   Dr. 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   Pericardial effusion 07/2009   admission  to [facility], s/p pericardial window   Pleural effusion 10/2009   s/p thoracentesis   PNA (pneumonia) 10/2009   admnission to [facility], with right pleural effusion, s/p thoracentesis   Sleep apnea    Syncope 08/2009   admission for fever and syncope, no source for fever seen on cultures. Syncope thought to be r/t orthostasis    Medications:  Per Anti-coag clinic visit note from 07/04/22 patient has had an aortic valve replacement and Afib.  INR goal of 2-3.  Dose at that visit was Warfarin 2 mg Mon/Fri and 4 mg AOD.  Confirmed with ED nurse that patient has not had dose today 07/22/22.  Assessment: 72 yo male with PMH including AVR and Afib. Patient presented to ED with palpitations and chest pain.  Patient found to be in Afib.  Pharmacy consulted for warfarin resumption and dosing.  Interacting medications: Amiodarone  Goal of Therapy:  INR 2-3 Monitor platelets by anticoagulation protocol: Yes   Date INR Warfarin Dose  6/24 2.8 2 mg  6/25 2.7 4 mg  6/26 2.5 4 mg     Plan:  INR is therapeutic Will give warfarin 4 mg x 1 (home regimen) tonight. Daily INR. CBC at least every 3 days. New start amiodarone, If continued at discharge warfarin dose may need to be adjusted. Ideally with a AVR I will aim for a INR of 2.5 - 3. Possible cardioversion 6/26.   Ronnald Ramp, PharmD, BCPS 07/24/2022,7:57 AM

## 2022-07-24 NOTE — Transfer of Care (Signed)
Immediate Anesthesia Transfer of Care Note  Patient: [name] [name]  Procedure(s) Performed: CARDIOVERSION  Patient Location: PACU  Anesthesia Type:General  Level of Consciousness: drowsy  Airway & Oxygen Therapy: Patient Spontanous Breathing and Patient connected to nasal cannula oxygen  Post-op Assessment: Report given to RN and Post -op Vital signs reviewed and stable  Post vital signs: Reviewed and stable  Last Vitals:  Vitals Value Taken Time  BP 107/71 07/24/22 1248  Temp    Pulse 56 07/24/22 1250  Resp 14 07/24/22 1250  SpO2 92 % 07/24/22 1250  Vitals shown include unvalidated device data.  Last Pain:  Vitals:   07/24/22 1231  TempSrc: Oral  PainSc: 0-No pain         Complications: No notable events documented.

## 2022-07-24 NOTE — H&P (View-Only) (Signed)
Patient ID: [name] [name], male   DOB: [birth year], 72 y.o.   MRN: [id number]    Advanced Heart Failure Team Consult Note   Primary Physician: Joaquim Nam, MD PCP-Cardiologist:  Rollene Rotunda, MD  Reason for Consultation: CHF  HPI:    [name] [name] is seen today for evaluation of CHF/atrial fibrillation at the request of Dr. End.   72 y.o. with history of bicuspid aortic valve s/p St Jude mechanical AVR, paroxysmal atrial fibrillation, heart failure with mid range EF, LBBB was admitted with what appears to be atypical atrial flutter with RVR.  Patient had St Jude mechanical AVR in 2011.  He takes warfarin and has had a therapeutic INR x months. Last echo in 1/24 showed EF 45-50%.  He has had atrial fibrillation in the past but generally has been in NSR.  Patient noted palpitations for about 1 week prior to admission.  He checked his HR and found it to be in the 130s.  He went to see his PCP and was in atrial fibrillation vs flutter with RVR and was sent to ER.  He denies significant chest pain.  He noted increased fatigue but was not particular short of breath. In the ER, he was in what appeared to be atypical flutter with RVR.  Amiodarone gtt was started.  CXR showed evidence for CHF.  Mildly elevated HS-TnI with no trend.   This morning, he remain in atypical atrial flutter with rate 110s.  BP stable.  No chest pain or dyspnea at rest. Echo was done showing EF 25-30%, moderate LV dilation with global HK, moderate LVH, mildly decreased RV systolic function, normally functioning mechanical AoV.  Review of Systems: All systems reviewed and negative except as per HPI.   Home Medications Prior to Admission medications   Medication Sig Start Date End Date Taking? Authorizing Provider  ACCU-CHEK AVIVA PLUS test strip USE AS DIRECTED TO CHECK BLOOD SUGAR 2 TO 4 TIMES A DAY 09/29/20  Yes Joaquim Nam, MD  Accu-Chek FastClix Lancets MISC USE AS DIRECTED TO CHECK BLOOD SUGAR TWICE  DAILY 09/29/20  Yes Joaquim Nam, MD  amoxicillin (AMOXIL) 500 MG tablet Take 4 tablets (2,000 mg total) by mouth daily as needed. Before dental visits. Dispense 5 courses 04/04/22  Yes Joaquim Nam, MD  azelastine (ASTELIN) 0.1 % nasal spray Place 2 sprays into both nostrils 2 (two) times daily as needed for rhinitis. Use in each nostril as directed 09/29/20  Yes Joaquim Nam, MD  carvedilol (COREG) 6.25 MG tablet TAKE ONE TABLET BY MOUTH TWICE A DAY. KEEP OFFICE VISIT 04/08/22  Yes Rollene Rotunda, MD  cetirizine (ZYRTEC) 10 MG tablet Take 10 mg by mouth daily.   Yes [provider]  empagliflozin (JARDIANCE) 10 MG TABS tablet Take 1 tablet (10 mg total) by mouth daily before breakfast. 01/17/22  Yes Rollene Rotunda, MD  fluocinonide cream (LIDEX) 0.05 % APPLY 1 APPLICATION TO AFFECTEDE AREAS 2 TIMES DAILY AS NEEDED 06/27/20  Yes Joaquim Nam, MD  glipiZIDE (GLUCOTROL) 5 MG tablet TAKE 2 TABLETS BY MOUTH DAILY BEFORE BREAKFAST IF AM SUGAR IS ABOVE 150 Patient taking differently: Take 5-10 mg by mouth See admin instructions. Take 1 tablet (5mg ) by mouth every morning before breakfast - take 2 tablets (10mg ) by mouth before breakfast if BG >150 11/12/21  Yes Joaquim Nam, MD  Lancets Misc. (ACCU-CHEK FASTCLIX LANCET) KIT AS DIRECTED 05/23/17  Yes Joaquim Nam, MD  losartan (COZAAR) 100  MG tablet TAKE 1 TABLET BY MOUTH ONCE A DAY (PLEASE SCHEDULE APPT) 01/28/22  Yes Rollene Rotunda, MD  metFORMIN (GLUCOPHAGE) 500 MG tablet Take 2 tablets (1,000 mg total) by mouth 2 (two) times daily. 01/03/22  Yes Joaquim Nam, MD  omeprazole (PRILOSEC) 20 MG capsule TAKE ONE CAPSULE BY MOUTH ONCE DAILY Patient taking differently: Take 20 mg by mouth daily before breakfast. 05/14/22  Yes Joaquim Nam, MD  potassium chloride (KLOR-CON) 10 MEQ tablet TAKE ONE TABLET BY MOUTH TWICE A DAY 05/28/22  Yes Joaquim Nam, MD  pravastatin (PRAVACHOL) 20 MG tablet TAKE ONE TABLET BY MOUTH ONCE A DAY  04/15/22  Yes Joaquim Nam, MD  psyllium (METAMUCIL) 58.6 % packet Take 1 packet by mouth daily.   Yes [provider]  tamsulosin (FLOMAX) 0.4 MG CAPS capsule Take 1 capsule (0.4 mg total) by mouth daily. Patient taking differently: Take 0.4 mg by mouth daily before breakfast. 10/04/21  Yes Joaquim Nam, MD  WARFARIN SODIUM PO Take 2-4 mg by mouth See admin instructions. Take 4mg  by mouth every Tuesday, Wednesday, Thursday, Saturday and Sunday night and take 2mg  by mouth every Monday and Friday night   Yes [provider]  furosemide (LASIX) 20 MG tablet TAKE 1 TABLET BY MOUTH ONCE DAILY Patient not taking: Reported on 07/22/2022 08/13/21   Joaquim Nam, MD  warfarin (COUMADIN) 1 MG tablet TAKE ONE TABLET ON SUNDAY, TUESDAY, WEDNESDAY, THURSDAY AND SATURDAY AND TAKE 2 TABLET ON MONDAYS AND FRIDAYS OR AS DIRECTED BY ANTICOAGULATION CLINIC 06/18/22   Joaquim Nam, MD  warfarin (COUMADIN) 3 MG tablet TAKE 1 TABLET BY MOUTH ON SUNDAYS, TUESDAYS, WEDNESDAYS, THURSDAYS AND SATURDAYS OR AS DIRECTED BY ANTICOAGULATION CINIC 11/02/21   Joaquim Nam, MD  warfarin (COUMADIN) 3 MG tablet TAKE ONE 3 MG TABLET BY MOUTH DAILY AND ONE 1 MG TABLET BY MOUTH DAILY (4 MG TOTAL) EXCEPT TAKE TWO 1 MG TABLETS (2 MG TOTAL) ON FRIDAYS OR AS DIRECTED BY ANTICOAGULATION CLINIC. 10/29/21   Joaquim Nam, MD  warfarin (COUMADIN) 3 MG tablet TAKE ONE TABLET BY MOUTH ON SUNDAY, TUESDAY, WEDNESDAY, THURSDAY, AND SATURDAY OR AS DIRECTED BY ANTICOAGULATION CLINIC 06/18/22   Joaquim Nam, MD    Past Medical History: Past Medical History:  Diagnosis Date   Anemia    Aortic insufficiency    with bicuspid aortic valve (the last MRI demonstrated the aortic root to be 4.8 x 4.7 cm.) Mod regurgitation. Normal chamber size with very mild left ventricular dysfunction.)   Blood transfusion    CHF (congestive heart failure) (HCC)    Clotting disorder (HCC)    Cough 06/28/2009   Dr. Sherene Sires   Diabetes  mellitus without complication (HCC)    Duodenal ulcer    with GI bleeding   Dyslipidemia    GERD (gastroesophageal reflux disease) 03/23/2001   egd w/ dilation...Marland KitchenMarland KitchenKaplan   Hemorrhoids    Hyperlipidemia    Hypertension    Necrotizing pancreatitis 09/2009   admission, and pseudocyst formation, also with ATN   Orthostasis 08/2009   Fever and syncope, no source for fever on cultures.   Pericardial effusion 07/2009   admission to [facility], s/p pericardial window   Pleural effusion 10/2009   s/p thoracentesis   PNA (pneumonia) 10/2009   admnission to [facility], with right pleural effusion, s/p thoracentesis   Sleep apnea    Syncope 08/2009   admission for fever and syncope, no source for fever seen on cultures.  Syncope thought to be r/t orthostasis    Past Surgical History: Past Surgical History:  Procedure Laterality Date   CARDIAC VALVE REPLACEMENT  07/10/2009   AVR   Cardiolite EKG  03/10/2003   (-) ? small/apical infarct EF 50%   DOPPLER ECHOCARDIOGRAPHY  01/18/97   LVH, mild dilated aorta - root mod to severe aortic regurg EF 50-55%   DOPPLER ECHOCARDIOGRAPHY  09/27/03   No changes, moderate severe aortic regurg   DOPPLER ECHOCARDIOGRAPHY  07/09/2004   Mod severe aortic regurg 2-3+ T.R., T.I.R., mild P..R.   DOPPLER ECHOCARDIOGRAPHY  04/01/06   Hypokin Apex EF 55%   DOPPLER ECHOCARDIOGRAPHY  02/15/2010   Mild LVH, mild decr Sys fctn EF 40-45% Mild Dias Dysfctn Mech AV Triv AR, MR   ESOPHAGOGASTRODUODENOSCOPY  1990, 1999   Hot dog in throat (1987)/ Chicken in throat (1990)/ 01/06/97 (Dr. Kinnie Scales) Impaction, dilatation, Schatzki's Ring/ 07/1998 repeat Dilatation/ 01/10/01 stricture/dilated/ 03/08/05 Schatzki's Ring, dilated/ 06/01/08 Food Disimpaction [name] Bulb Ulcer (Dr. Ewing Schlein)   ESOPHAGOGASTRODUODENOSCOPY  01/06/1997   (Dr. Kinnie Scales) Impaction, dilatation, Schatzki's Ring   ESOPHAGOGASTRODUODENOSCOPY  08/09/98   Repeat dilatation [name])   ESOPHAGOGASTRODUODENOSCOPY  01/13/2001    stricture/ HH, duodenal ulcer / bleeding Marina Goodell)   ESOPHAGOGASTRODUODENOSCOPY  12/25/01   esophagus stricture, dilated   ESOPHAGOGASTRODUODENOSCOPY  03/23/2001   stricture, dilated   ESOPHAGOGASTRODUODENOSCOPY  03/08/05   Schatzki's Ring   03/13/05  Schatzki's Ring - dilated, duodenitis   ESOPHAGOGASTRODUODENOSCOPY  06/11/08   with food disimpaction.  Food impaction Ms HH  Sm bulb ulcer (Dr. Ewing Schlein)   ESOPHAGOGASTRODUODENOSCOPY     stricture/HH, duodenal ulcer/bleeding Marina Goodell)   ESOPHAGOGASTRODUODENOSCOPY N/A 01/23/2016   Procedure: ESOPHAGOGASTRODUODENOSCOPY (EGD);  Surgeon: Midge Minium, MD;  Location: [facility] ENDOSCOPY;  Service: Endoscopy;  Laterality: N/A;   ESOPHAGOGASTRODUODENOSCOPY N/A 03/10/2021   Procedure: ESOPHAGOGASTRODUODENOSCOPY (EGD);  Surgeon: Jenel Lucks, MD;  Location: [facility] ENDOSCOPY;  Service: Gastroenterology;  Laterality: N/A;   ESOPHAGOGASTRODUODENOSCOPY (EGD) WITH PROPOFOL N/A 02/06/2016   Procedure: ESOPHAGOGASTRODUODENOSCOPY (EGD) WITH PROPOFOL;  Surgeon: Midge Minium, MD;  Location: [facility] ENDOSCOPY;  Service: Endoscopy;  Laterality: N/A;   FOREIGN BODY REMOVAL  03/10/2021   Procedure: FOREIGN BODY REMOVAL;  Surgeon: Jenel Lucks, MD;  Location: [facility] ENDOSCOPY;  Service: Gastroenterology;;   HERNIA REPAIR  2011   abdominal; post AVR   MRI  2/05   LVH global hypokin EF 48% Mod A.I.   VALVE REPLACEMENT  06/2009   St. Jude mechanical valve per Dr. Laneta Simmers    Family History: Family History  Problem Relation Age of Onset   Obesity Mother        bedridden   Hypertension Mother    Heart disease Father        CABG, CAD   Hypertension Father    Lymphoma Father    Cancer Father        lymphoma   Alcohol abuse Brother    Cancer Brother        melanoma   Colon cancer Paternal Aunt    Stomach cancer Neg Hx    Rectal cancer Neg Hx    Prostate cancer Neg Hx     Social History: Social History   Socioeconomic History   Marital status: Married    Spouse name:  Not on file   Number of children: 2   Years of education: Not on file   Highest education level: Not on file  Occupational History   Occupation: mech. supervisor    Employer: [employer]    Comment[place]  Tobacco Use   Smoking status: Never   Smokeless tobacco: Never  Vaping Use   Vaping Use: Never used  Substance and Sexual Activity   Alcohol use: No    Alcohol/week: 0.0 standard drinks of alcohol   Drug use: No   Sexual activity: Not on file  Other Topics Concern   Not on file  Social History Narrative   Married 1996   Social Determinants of Health   Financial Resource Strain: Low Risk  (01/02/2022)   Overall Financial Resource Strain (CARDIA)    Difficulty of Paying Living Expenses: Not hard at all  Food Insecurity: No Food Insecurity (07/23/2022)   Hunger Vital Sign    Worried About Running Out of Food in the Last Year: Never true    Ran Out of Food in the Last Year: Never true  Transportation Needs: No Transportation Needs (07/23/2022)   PRAPARE - Administrator, Civil Service (Medical): No    Lack of Transportation (Non-Medical): No  Physical Activity: Inactive (12/16/2018)   Exercise Vital Sign    Days of Exercise per Week: 0 days    Minutes of Exercise per Session: 0 min  Stress: No Stress Concern Present (01/02/2022)   [employer] of Occupational Health - Occupational Stress Questionnaire    Feeling of Stress : Not at all  Social Connections: Unknown (01/02/2022)   Social Connection and Isolation Panel [NHANES]    Frequency of Communication with Friends and Family: Not on file    Frequency of Social Gatherings with Friends and Family: Not on file    Attends Religious Services: Not on file    Active Member of Clubs or Organizations: Not on file    Attends Banker Meetings: Not on file    Marital Status: Married    Allergies:  Allergies  Allergen Reactions   Ace Inhibitors     REACTION: cough   Aspirin Other (See  Comments)    Gastritis.    Other     dexcom patch- local rash- presumed adhesive allergy.     Primaxin [Imipenem W/Cilastatin Sodium]     Rash- but able to take amoxil w/o troubles.    Tape     Rash from bandaids.      Objective:    Vital Signs:   Temp:  [98 F (36.7 C)-98.5 F (36.9 C)] 98 F (36.7 C) (06/26 0758) Pulse Rate:  [104-116] 115 (06/26 0758) Resp:  [18-26] 18 (06/26 0758) BP: (97-129)/(68-93) 114/75 (06/26 0758) SpO2:  [91 %-96 %] 95 % (06/26 0758) Last BM Date : 07/23/22  Weight change: Filed Weights   07/22/22 1550  Weight: 106.1 kg    Intake/Output:   Intake/Output Summary (Last 24 hours) at 07/24/2022 1018 Last data filed at 07/24/2022 0219 Gross per 24 hour  Intake 649.16 ml  Output 700 ml  Net -50.84 ml      Physical Exam    General:  Well appearing. No resp difficulty HEENT: normal Neck: Thick. JVP 8-9 cm. Carotids 2+ bilat; no bruits. No lymphadenopathy or thyromegaly appreciated. Cor: PMI nondisplaced. Irregular rate & rhythm. No rubs, gallops or murmurs. Lungs: clear Abdomen: soft, nontender, nondistended. No hepatosplenomegaly. No bruits or masses. Good bowel sounds. Extremities: no cyanosis, clubbing, rash, trace ankle edema.  Neuro: alert & orientedx3, cranial nerves grossly intact. moves all 4 extremities w/o difficulty. Affect pleasant   Telemetry   Suspect atypical flutter (personally reviewed)  EKG  Atypical flutter, LBBB 146 msec (personally reviewed)  Labs   Basic Metabolic Panel: Recent Labs  Lab 07/22/22 1555 07/22/22 1755 07/24/22 0730  NA 138  --  135  K 4.6  --  3.8  CL 106  --  104  CO2 24  --  22  GLUCOSE 213*  --  180*  BUN 16  --  19  CREATININE 1.22  --  1.08  CALCIUM 9.0  --  8.5*  MG  --  2.1  --     Liver Function Tests: Recent Labs  Lab 07/22/22 1555  AST 28  ALT 22  ALKPHOS 78  BILITOT 1.1  PROT 6.9  ALBUMIN 3.9   No results for input(s): "LIPASE", "AMYLASE" in the last 168  hours. No results for input(s): "AMMONIA" in the last 168 hours.  CBC: Recent Labs  Lab 07/22/22 1555 07/24/22 0730  WBC 7.7 5.6  HGB 14.9 15.0  HCT 43.9 44.0  MCV 92.0 90.2  PLT 149* 146*    Cardiac Enzymes: No results for input(s): "CKTOTAL", "CKMB", "CKMBINDEX", "TROPONINI" in the last 168 hours.  BNP: BNP (last 3 results) Recent Labs    07/22/22 1842  BNP 244.9*    ProBNP (last 3 results) No results for input(s): "PROBNP" in the last 8760 hours.   CBG: Recent Labs  Lab 07/23/22 1116 07/23/22 1630 07/23/22 2123 07/24/22 0430 07/24/22 0759  GLUCAP 210* 150* 137* 190* 192*    Coagulation Studies: Recent Labs    07/22/22 1836 07/23/22 0510 07/24/22 0458  LABPROT 29.6* 29.3* 27.0*  INR 2.8* 2.7* 2.5*     Imaging   ECHOCARDIOGRAM COMPLETE  Result Date: 07/23/2022    ECHOCARDIOGRAM REPORT   Patient Name:   NEKHI LIWANAG Date of Exam: 07/23/2022 Medical Rec #:  [id number]          Height:       71.0 in Accession #:    [id number]         Weight:       234.0 lb Date of Birth:  1950-11-09          BSA:          2.254 m Patient Age:    72 years           BP:           128/87 mmHg Patient Gender: M                  HR:           117 bpm. Exam Location:  [facility] Procedure: 2D Echo Indications:     Atrial fibrillation                  Chest pain  History:         Patient has prior history of Echocardiogram examinations, most                  recent 02/16/2021. CHF, Aortic Valve Disease, Arrythmias:Atrial                  Fibrillation; Signs/Symptoms:Chest Pain and palpitations.                  Aortic Valve: 27 mm St. Jude bileaflet valve is present in the                  aortic position.  Sonographer:     Mikki Harbor Referring Phys:  [phone] EKTA V PATEL  Diagnosing Phys: Yvonne Kendall MD IMPRESSIONS  1. Left ventricular ejection fraction, by estimation, is 25 to 30%. The left ventricle has severely decreased function. The left ventricle demonstrates global  hypokinesis. The left ventricular internal cavity size was moderately dilated. There is moderate left ventricular hypertrophy. Left ventricular diastolic function could not be evaluated.  2. Right ventricular systolic function is mildly reduced. The right ventricular size is normal. There is normal pulmonary artery systolic pressure.  3. The mitral valve is normal in structure. Mild mitral valve regurgitation.  4. The aortic valve has been repaired/replaced. Aortic valve regurgitation is not visualized. There is a 27 mm St. Jude bileaflet valve present in the aortic position. Echo findings are consistent with normal structure and function of the aortic valve prosthesis. Aortic valve mean gradient measures 5.0 mmHg.  5. Aortic root/ascending aorta has been repaired/replaced.  6. The inferior vena cava is normal in size with greater than 50% respiratory variability, suggesting right atrial pressure of 3 mmHg. FINDINGS  Left Ventricle: Left ventricular ejection fraction, by estimation, is 25 to 30%. The left ventricle has severely decreased function. The left ventricle demonstrates global hypokinesis. The left ventricular internal cavity size was moderately dilated. There is moderate left ventricular hypertrophy. Abnormal (paradoxical) septal motion, consistent with left bundle branch block. Left ventricular diastolic function could not be evaluated due to atrial fibrillation. Left ventricular diastolic function could not be evaluated. Right Ventricle: The right ventricular size is normal. No increase in right ventricular wall thickness. Right ventricular systolic function is mildly reduced. There is normal pulmonary artery systolic pressure. The tricuspid regurgitant velocity is 2.20 m/s, and with an assumed right atrial pressure of 3 mmHg, the estimated right ventricular systolic pressure is 22.4 mmHg. Left Atrium: Left atrial size was normal in size. Right Atrium: Right atrial size was normal in size. Pericardium:  There is no evidence of pericardial effusion. Mitral Valve: The mitral valve is normal in structure. Mild mitral valve regurgitation. MV peak gradient, 5.2 mmHg. The mean mitral valve gradient is 2.0 mmHg. Tricuspid Valve: The tricuspid valve is not well visualized. Tricuspid valve regurgitation is trivial. Aortic Valve: The aortic valve has been repaired/replaced. Aortic valve regurgitation is not visualized. Aortic valve mean gradient measures 5.0 mmHg. Aortic valve peak gradient measures 9.8 mmHg. Aortic valve area, by VTI measures 2.95 cm. There is a 27 mm St. Jude bileaflet valve present in the aortic position. Echo findings are consistent with normal structure and function of the aortic valve prosthesis. Pulmonic Valve: The pulmonic valve was not well visualized. Pulmonic valve regurgitation is not visualized. No evidence of pulmonic stenosis. Aorta: The aortic root/ascending aorta has been repaired/replaced. Pulmonary Artery: The pulmonary artery is of normal size. Venous: The inferior vena cava is normal in size with greater than 50% respiratory variability, suggesting right atrial pressure of 3 mmHg. IAS/Shunts: No atrial level shunt detected by color flow Doppler.  LEFT VENTRICLE PLAX 2D LVIDd:         6.20 cm LVIDs:         4.30 cm LV PW:         1.40 cm LV IVS:        1.40 cm LVOT diam:     2.10 cm LV SV:         78 LV SV Index:   35 LVOT Area:     3.46 cm  LV Volumes (MOD) LV vol d, MOD A2C: 120.5 ml LV vol d, MOD A4C: 113.0 ml LV vol  s, MOD A2C: 101.3 ml LV vol s, MOD A4C: 112.6 ml LV SV MOD A2C:     19.1 ml LV SV MOD A4C:     113.0 ml LV SV MOD BP:      0.0 ml RIGHT VENTRICLE RV Basal diam:  3.65 cm RV Mid diam:    3.50 cm RV S prime:     8.05 cm/s TAPSE (M-mode): 1.6 cm LEFT ATRIUM             Index        RIGHT ATRIUM           Index LA diam:        4.50 cm 2.00 cm/m   RA Area:     16.60 cm LA Vol (A2C):   65.3 ml 28.97 ml/m  RA Volume:   44.00 ml  19.52 ml/m LA Vol (A4C):   47.0 ml 20.85  ml/m LA Biplane Vol: 58.4 ml 25.91 ml/m  AORTIC VALVE                     PULMONIC VALVE AV Area (Vmax):    3.04 cm      PV Vmax:       1.03 m/s AV Area (Vmean):   2.95 cm      PV Peak grad:  4.3 mmHg AV Area (VTI):     2.95 cm AV Vmax:           156.33 cm/s AV Vmean:          104.000 cm/s AV VTI:            0.265 m AV Peak Grad:      9.8 mmHg AV Mean Grad:      5.0 mmHg LVOT Vmax:         137.00 cm/s LVOT Vmean:        88.467 cm/s LVOT VTI:          0.225 m LVOT/AV VTI ratio: 0.85  AORTA Ao Root diam: 2.70 cm Ao Asc diam:  3.10 cm MITRAL VALVE               TRICUSPID VALVE MV Area (PHT): 4.49 cm    TR Peak grad:   19.4 mmHg MV Area VTI:   4.38 cm    TR Vmax:        220.00 cm/s MV Peak grad:  5.2 mmHg MV Mean grad:  2.0 mmHg    SHUNTS MV Vmax:       1.14 m/s    Systemic VTI:  0.23 m MV Vmean:      54.1 cm/s   Systemic Diam: 2.10 cm MV Decel Time: 169 msec MV E velocity: 73.90 cm/s Cristal Deer End MD Electronically signed by Yvonne Kendall MD Signature Date/Time: 07/23/2022/5:43:04 PM    Final      Medications:     Current Medications:  carvedilol  3.125 mg Oral BID WC   empagliflozin  10 mg Oral Daily   furosemide  40 mg Intravenous BID   insulin aspart  0-15 Units Subcutaneous TID WC   pantoprazole (PROTONIX) IV  40 mg Intravenous Q12H   potassium chloride  40 mEq Oral Once   pravastatin  20 mg Oral Daily   sodium chloride flush  3 mL Intravenous Q12H   spironolactone  12.5 mg Oral Daily   tamsulosin  0.4 mg Oral Daily   warfarin  4 mg Oral ONCE-1600   Warfarin - Pharmacist Dosing Inpatient  Does not apply q1600    Infusions:  sodium chloride     amiodarone 30 mg/hr (07/24/22 6045)     Assessment/Plan   1. Atypical atrial flutter: With RVR. Patient has history of PAF but has generally been in NSR in the past.  HR 100s today on amiodarone 30 mg/hr.  He is on warfarin, INR has been therapeutic x months.  Atrial flutter with RVR may have contributed to worsening of EF as a  tachycardia-mediated CMP.  We will need to get him back into NSR.  - DCCV today.  - Continue amiodarone IV overnight after cardioversion then to po.  - Continue warfarin.  - Would favor evaluation for atrial fibrillation/flutter ablation in the future.  2. Acute on chronic systolic CHF: Prior echo in 1/24 with EF 45-50%.  Has baseline LBBB.  Echo this admission with EF 25-30%, moderate LV dilation with global HK, moderate LVH, mildly decreased RV systolic function, normally functioning mechanical AoV. It is possible that drop in EF is tachycardia-mediated with atypical atrial flutter/RVR of uncertain duration.  He is mildly volume overloaded on exam.  - Lasix 40 mg IV bid x 2 doses today and replace K.  - Continue Coreg 3.125 mg bid.  - Add back Jardiance 10 mg daily.  - Start spironolactone 12.5 daily.  - Will follow BP and creatinine, would like to start [facility] before discharge.  - Will need repeat echo a month or so after DCCV to NSR.  If EF improves back to baseline, likely tachycardia-mediated.  If not, would favor cath (would have to bridge with Lovenox off warfarin given mechanical AoV).  3. St Jude mechanical aortic valve: h/o bicuspid aortic valve with AS.  Valve functioned normally on echo this admission.  - Continue warfarin.  4. Suspect OSA: He snores, has thick neck. Would recommend outpatient sleep study.  Suspect OSA contributes to atrial arrhythmias.  5. Ascending aortic aneurysm: Has history of dilated ascending aorta in setting of bicuspid aortic valve.  Will need some form of thoracic aorta evaluation, can do as outpatient.   Marca Ancona 07/24/2022 10:33 AM   Length of Stay: 2  Marca Ancona, MD  07/24/2022, 10:18 AM  Advanced Heart Failure Team Pager [phone] (M-F; 7a - 5p)  Please contact [facility] for night-coverage after hours (4p -7a ) and weekends on [email or web address]

## 2022-07-24 NOTE — Procedures (Signed)
Electrical Cardioversion Procedure Note HERBIE LEHRMANN [id number] 04-26-1950  Procedure: Electrical Cardioversion Indications:  Atrial Flutter  Procedure Details Consent: Risks of procedure as well as the alternatives and risks of each were explained to the (patient/caregiver).  Consent for procedure obtained. Time Out: Verified patient identification, verified procedure, site/side was marked, verified correct patient position, special equipment/implants available, medications/allergies/relevent history reviewed, required imaging and test results available.  Performed  Patient placed on cardiac monitor, pulse oximetry, supplemental oxygen as necessary.  Sedation given:  Propofol per anesthesiology Pacer pads placed anterior and posterior chest.  Cardioverted 1 time(s).  Cardioverted at 200J.  Evaluation Findings: Post procedure EKG shows: NSR Complications: None Patient did tolerate procedure well.   Marca Ancona 07/24/2022, 12:48 PM

## 2022-07-24 NOTE — Assessment & Plan Note (Signed)
Pt presenting with Palpitations/A-fib RVR Started on amiodarone infusion- -Patient underwent successful cardioversion today -Continue with amiodarone infusion -Continue with Coumadin

## 2022-07-24 NOTE — Anesthesia Preprocedure Evaluation (Addendum)
Anesthesia Evaluation  Patient identified by MRN, date of birth, ID band Patient awake    Reviewed: Allergy & Precautions, H&P , NPO status , Patient's Chart, lab work & pertinent test results  Airway Mallampati: III  TM Distance: >3 FB Neck ROM: full    Dental no notable dental hx.    Pulmonary sleep apnea    Pulmonary exam normal        Cardiovascular hypertension, +CHF  + dysrhythmias Atrial Fibrillation + Valvular Problems/Murmurs  Rhythm:Irregular Rate:Tachycardia - Systolic murmurs and - Peripheral Edema S/P AVR   Neuro/Psych negative neurological ROS  negative psych ROS   GI/Hepatic Neg liver ROS,GERD  Controlled,,  Endo/Other  diabetes, Type 2    Renal/GU negative Renal ROS  negative genitourinary   Musculoskeletal   Abdominal  (+) + obese  Peds  Hematology negative hematology ROS (+)   Anesthesia Other Findings Past Medical History: No date: Anemia No date: Aortic insufficiency     Comment:  with bicuspid aortic valve (the last MRI demonstrated               the aortic root to be 4.8 x 4.7 cm.) Mod regurgitation.               Normal chamber size with very mild left ventricular               dysfunction.) No date: Blood transfusion No date: CHF (congestive heart failure) (HCC) No date: Clotting disorder (HCC) 06/28/2009: Cough     Comment:  Dr. Sherene Sires No date: Diabetes mellitus without complication (HCC) No date: Duodenal ulcer     Comment:  with GI bleeding No date: Dyslipidemia 03/23/2001: GERD (gastroesophageal reflux disease)     Comment:  egd w/ dilation...Marland KitchenMarland KitchenArlyce Dice No date: Hemorrhoids No date: Hyperlipidemia No date: Hypertension 09/2009: Necrotizing pancreatitis     Comment:  admission, and pseudocyst formation, also with ATN 08/2009: Orthostasis     Comment:  Fever and syncope, no source for fever on cultures. 07/2009: Pericardial effusion     Comment:  admission to [facility], s/p  pericardial window 10/2009: Pleural effusion     Comment:  s/p thoracentesis 10/2009: PNA (pneumonia)     Comment:  admnission to [facility], with right pleural effusion, s/p               thoracentesis No date: Sleep apnea 08/2009: Syncope     Comment:  admission for fever and syncope, no source for fever               seen on cultures. Syncope thought to be r/t orthostasis  Past Surgical History: 07/10/2009: CARDIAC VALVE REPLACEMENT     Comment:  AVR 03/10/2003: Cardiolite EKG     Comment:  (-) ? small/apical infarct EF 50% 01/18/97: DOPPLER ECHOCARDIOGRAPHY     Comment:  LVH, mild dilated aorta - root mod to severe aortic               regurg EF 50-55% 09/27/03: DOPPLER ECHOCARDIOGRAPHY     Comment:  No changes, moderate severe aortic regurg 07/09/2004: DOPPLER ECHOCARDIOGRAPHY     Comment:  Mod severe aortic regurg 2-3+ T.R., T.I.R., mild P..R. 04/01/06: DOPPLER ECHOCARDIOGRAPHY     Comment:  Hypokin Apex EF 55% 02/15/2010: DOPPLER ECHOCARDIOGRAPHY     Comment:  Mild LVH, mild decr Sys fctn EF 40-45% Mild Dias Dysfctn              Mech AV Triv AR, MR 1990, 1999: ESOPHAGOGASTRODUODENOSCOPY  Comment:  Hot dog in throat (1987)/ Chicken in throat (1990)/               01/06/97 (Dr. Kinnie Scales) Impaction, dilatation, Schatzki's               Ring/ 07/1998 repeat Dilatation/ 01/10/01               stricture/dilated/ 03/08/05 Schatzki's Ring, dilated/               06/01/08 Food Disimpaction [name] Bulb Ulcer (Dr. Ewing Schlein) 01/06/1997: ESOPHAGOGASTRODUODENOSCOPY     Comment:  (Dr. Kinnie Scales) Impaction, dilatation, Schatzki's Ring 08/09/98: ESOPHAGOGASTRODUODENOSCOPY     Comment:  Repeat dilatation [name]) 01/13/2001: ESOPHAGOGASTRODUODENOSCOPY     Comment:  stricture/ HH, duodenal ulcer / bleeding Marina Goodell) 12/25/01: ESOPHAGOGASTRODUODENOSCOPY     Comment:  esophagus stricture, dilated 03/23/2001: ESOPHAGOGASTRODUODENOSCOPY     Comment:  stricture, dilated 03/08/05: ESOPHAGOGASTRODUODENOSCOPY      Comment:  Schatzki's Ring   03/13/05  Schatzki's Ring - dilated,               duodenitis 06/11/08: ESOPHAGOGASTRODUODENOSCOPY     Comment:  with food disimpaction.  Food impaction Ms HH  Sm bulb               ulcer (Dr. Ewing Schlein) No date: ESOPHAGOGASTRODUODENOSCOPY     Comment:  stricture/HH, duodenal ulcer/bleeding Marina Goodell) 01/23/2016: ESOPHAGOGASTRODUODENOSCOPY; N/A     Comment:  Procedure: ESOPHAGOGASTRODUODENOSCOPY (EGD);  Surgeon:               Midge Minium, MD;  Location: [facility] ENDOSCOPY;  Service:               Endoscopy;  Laterality: N/A; 03/10/2021: ESOPHAGOGASTRODUODENOSCOPY; N/A     Comment:  Procedure: ESOPHAGOGASTRODUODENOSCOPY (EGD);  Surgeon:               Jenel Lucks, MD;  Location: [facility] ENDOSCOPY;                Service: Gastroenterology;  Laterality: N/A; 02/06/2016: ESOPHAGOGASTRODUODENOSCOPY (EGD) WITH PROPOFOL; N/A     Comment:  Procedure: ESOPHAGOGASTRODUODENOSCOPY (EGD) WITH               PROPOFOL;  Surgeon: Midge Minium, MD;  Location: [facility]               ENDOSCOPY;  Service: Endoscopy;  Laterality: N/A; 03/10/2021: FOREIGN BODY REMOVAL     Comment:  Procedure: FOREIGN BODY REMOVAL;  Surgeon: Jenel Lucks, MD;  Location: [facility] ENDOSCOPY;  Service:               Gastroenterology;; 2011: HERNIA REPAIR     Comment:  abdominal; post AVR 2/05: MRI     Comment:  LVH global hypokin EF 48% Mod A.I. 06/2009: VALVE REPLACEMENT     Comment:  St. Jude mechanical valve per Dr. Laneta Simmers  BMI    Body Mass Index: 32.64 kg/m      Reproductive/Obstetrics negative OB ROS                             Anesthesia Physical Anesthesia Plan  ASA: 3  Anesthesia Plan: General   Post-op Pain Management:    Induction: Intravenous  PONV Risk Score and Plan: Propofol infusion and TIVA  Airway Management Planned: Natural Airway  Additional Equipment:   Intra-op Plan:   Post-operative  Plan:   Informed Consent: I have reviewed the  patients History and Physical, chart, labs and discussed the procedure including the risks, benefits and alternatives for the proposed anesthesia with the patient or authorized representative who has indicated his/her understanding and acceptance.     Dental Advisory Given  Plan Discussed with: CRNA and Surgeon  Anesthesia Plan Comments:        Anesthesia Quick Evaluation

## 2022-07-24 NOTE — TOC CM/SW Note (Signed)
Transition of Care [facility]) - Inpatient Brief Assessment   Patient Details  Name: [name] [name] MRN: [id number] Date of Birth: 1950/10/16  Transition of Care [facility]) CM/SW Contact:    Margarito Liner, LCSW Phone Number: 07/24/2022, 4:18 PM   Clinical Narrative: CSW reviewed chart. No TOC needs identified at this time. CSW will continue to follow. Please place [facility] consult if any needs arise.  Transition of Care Asessment: Insurance and Status: Insurance coverage has been reviewed Patient has primary care physician: Yes Home environment has been reviewed: Single family home with wife Prior level of function:: Not documented Prior/Current Home Services: No current home services Social Determinants of Health Reivew: SDOH reviewed no interventions necessary Readmission risk has been reviewed: Yes Transition of care needs: no transition of care needs at this time

## 2022-07-25 ENCOUNTER — Other Ambulatory Visit (HOSPITAL_COMMUNITY): Payer: Self-pay

## 2022-07-25 ENCOUNTER — Telehealth: Payer: Self-pay

## 2022-07-25 ENCOUNTER — Encounter: Payer: Self-pay | Admitting: Internal Medicine

## 2022-07-25 DIAGNOSIS — G4733 Obstructive sleep apnea (adult) (pediatric): Secondary | ICD-10-CM

## 2022-07-25 DIAGNOSIS — R079 Chest pain, unspecified: Secondary | ICD-10-CM | POA: Diagnosis not present

## 2022-07-25 DIAGNOSIS — I4891 Unspecified atrial fibrillation: Secondary | ICD-10-CM | POA: Diagnosis not present

## 2022-07-25 DIAGNOSIS — Z952 Presence of prosthetic heart valve: Secondary | ICD-10-CM | POA: Diagnosis not present

## 2022-07-25 DIAGNOSIS — I420 Dilated cardiomyopathy: Secondary | ICD-10-CM

## 2022-07-25 DIAGNOSIS — I10 Essential (primary) hypertension: Secondary | ICD-10-CM

## 2022-07-25 LAB — PROTIME-INR
INR: 2.6 — ABNORMAL HIGH (ref 0.8–1.2)
Prothrombin Time: 28 seconds — ABNORMAL HIGH (ref 11.4–15.2)

## 2022-07-25 LAB — BASIC METABOLIC PANEL
Anion gap: 9 (ref 5–15)
BUN: 23 mg/dL (ref 8–23)
CO2: 23 mmol/L (ref 22–32)
Calcium: 8.8 mg/dL — ABNORMAL LOW (ref 8.9–10.3)
Chloride: 102 mmol/L (ref 98–111)
Creatinine, Ser: 1.28 mg/dL — ABNORMAL HIGH (ref 0.61–1.24)
GFR, Estimated: >60 mL/min (ref >60)
Glucose, Bld: 208 mg/dL — ABNORMAL HIGH (ref 70–99)
Potassium: 3.8 mmol/L (ref 3.5–5.1)
Sodium: 134 mmol/L — ABNORMAL LOW (ref 135–145)

## 2022-07-25 LAB — GLUCOSE, CAPILLARY
Glucose-Capillary: 181 mg/dL — ABNORMAL HIGH (ref 70–99)
Glucose-Capillary: 221 mg/dL — ABNORMAL HIGH (ref 70–99)

## 2022-07-25 MED ORDER — CARVEDILOL 3.125 MG PO TABS: 3.1250 mg | ORAL_TABLET | Freq: Two times a day (BID) | ORAL | 1 refills | Status: DC

## 2022-07-25 MED ORDER — FUROSEMIDE 20 MG PO TABS: 20.0000 mg | ORAL_TABLET | Freq: Every day | ORAL | 2 refills | Status: DC

## 2022-07-25 MED ORDER — POTASSIUM CHLORIDE ER 10 MEQ PO TBCR: 10.0000 meq | EXTENDED_RELEASE_TABLET | Freq: Every day | ORAL | 3 refills | Status: DC

## 2022-07-25 MED ORDER — SPIRONOLACTONE 25 MG PO TABS: 12.5000 mg | ORAL_TABLET | Freq: Every day | ORAL | 1 refills | Status: DC

## 2022-07-25 MED ORDER — WARFARIN SODIUM 4 MG PO TABS
4.0000 mg | ORAL_TABLET | Freq: Once | ORAL | Status: DC
  Filled 2022-07-25: qty 1

## 2022-07-25 MED ORDER — AMIODARONE HCL 200 MG PO TABS: 200.0000 mg | ORAL_TABLET | Freq: Two times a day (BID) | ORAL | 1 refills | Status: DC

## 2022-07-25 MED ORDER — AMIODARONE HCL 200 MG PO TABS
200.0000 mg | ORAL_TABLET | Freq: Two times a day (BID) | ORAL | Status: DC
  Administered 2022-07-25: 200 mg via ORAL
  Filled 2022-07-25: qty 1

## 2022-07-25 MED ORDER — LOSARTAN POTASSIUM 100 MG PO TABS: ORAL_TABLET | ORAL | 12 refills | Status: DC

## 2022-07-25 NOTE — Progress Notes (Signed)
Rounding Note    Patient Name: [name] [name] Date of Encounter: 07/25/2022  [facility] Cardiologist: Rollene Rotunda, MD   Subjective   He is maintaining in sinus rhythm since he had cardioversion yesterday.  He feels better overall.  Slight worsening of renal function after diuresis.    Inpatient Medications    Scheduled Meds:  amiodarone  200 mg Oral BID   carvedilol  3.125 mg Oral BID WC   empagliflozin  10 mg Oral Daily   insulin aspart  0-15 Units Subcutaneous TID WC   pantoprazole (PROTONIX) IV  40 mg Intravenous Q12H   pravastatin  20 mg Oral Daily   sodium chloride flush  3 mL Intravenous Q12H   spironolactone  12.5 mg Oral Daily   tamsulosin  0.4 mg Oral Daily   warfarin  4 mg Oral ONCE-1600   Warfarin - Pharmacist Dosing Inpatient   Does not apply q1600   Continuous Infusions:  sodium chloride     PRN Meds: sodium chloride, acetaminophen **OR** acetaminophen, hydrALAZINE, morphine injection, nitroGLYCERIN, ondansetron **OR** ondansetron (ZOFRAN) IV, sodium chloride flush   Vital Signs    Vitals:   07/24/22 2340 07/25/22 0425 07/25/22 0840 07/25/22 1142  BP: 117/73 122/71 132/72 113/66  Pulse: 73 66 (!) 59 63  Resp: 17 18 20 20   Temp: 97.7 F (36.5 C) 97.7 F (36.5 C) 97.6 F (36.4 C) 97.7 F (36.5 C)  TempSrc: Oral Oral Oral   SpO2: 94% 96% 100% 95%  Weight:      Height:        Intake/Output Summary (Last 24 hours) at 07/25/2022 1235 Last data filed at 07/25/2022 1100 Gross per 24 hour  Intake 1180 ml  Output 2025 ml  Net -845 ml      07/22/2022    3:50 PM 04/04/2022    9:05 AM 01/17/2022    8:29 AM  Last 3 Weights  Weight (lbs) 234 lb 237 lb 243 lb  Weight (kg) 106.142 kg 107.502 kg 110.224 kg      Telemetry    Normal sinus rhythm with PVCs and motion artifact- Personally Reviewed  ECG     - Personally Reviewed  Physical Exam   GEN: No acute distress.   Neck: No JVD Cardiac: RRR, normal mechanical heart sounds.   No murmurs, rubs, or gallops.  Respiratory: Clear to auscultation bilaterally. GI: Soft, nontender, non-distended  MS: No edema; No deformity. Neuro:  Nonfocal  Psych: Normal affect   Labs    High Sensitivity Troponin:   Recent Labs  Lab 07/22/22 1555 07/22/22 1755  TROPONINIHS 71* 70*     Chemistry Recent Labs  Lab 07/22/22 1555 07/22/22 1755 07/24/22 0730 07/25/22 0453  NA 138  --  135 134*  K 4.6  --  3.8 3.8  CL 106  --  104 102  CO2 24  --  22 23  GLUCOSE 213*  --  180* 208*  BUN 16  --  19 23  CREATININE 1.22  --  1.08 1.28*  CALCIUM 9.0  --  8.5* 8.8*  MG  --  2.1  --   --   PROT 6.9  --   --   --   ALBUMIN 3.9  --   --   --   AST 28  --   --   --   ALT 22  --   --   --   ALKPHOS 78  --   --   --  BILITOT 1.1  --   --   --   GFRNONAA >60  --  >60 >60  ANIONGAP 8  --  9 9    Lipids No results for input(s): "CHOL", "TRIG", "HDL", "LABVLDL", "LDLCALC", "CHOLHDL" in the last 168 hours.  Hematology Recent Labs  Lab 07/22/22 1555 07/24/22 0730  WBC 7.7 5.6  RBC 4.77 4.88  HGB 14.9 15.0  HCT 43.9 44.0  MCV 92.0 90.2  MCH 31.2 30.7  MCHC 33.9 34.1  RDW 14.9 14.3  PLT 149* 146*   Thyroid  Recent Labs  Lab 07/22/22 2206  TSH 0.981  FREET4 1.16*    BNP Recent Labs  Lab 07/22/22 1842  BNP 244.9*    DDimer No results for input(s): "DDIMER" in the last 168 hours.   Radiology    ECHOCARDIOGRAM COMPLETE  Result Date: 07/23/2022    ECHOCARDIOGRAM REPORT   Patient Name:   TAHJ NJOKU Date of Exam: 07/23/2022 Medical Rec #:  [id number]          Height:       71.0 in Accession #:    [id number]         Weight:       234.0 lb Date of Birth:  1950/07/02          BSA:          2.254 m Patient Age:    72 years           BP:           128/87 mmHg Patient Gender: M                  HR:           117 bpm. Exam Location:  [facility] Procedure: 2D Echo Indications:     Atrial fibrillation                  Chest pain  History:         Patient has prior history of  Echocardiogram examinations, most                  recent 02/16/2021. CHF, Aortic Valve Disease, Arrythmias:Atrial                  Fibrillation; Signs/Symptoms:Chest Pain and palpitations.                  Aortic Valve: 27 mm St. Jude bileaflet valve is present in the                  aortic position.  Sonographer:     Mikki Harbor Referring Phys:  [id number] Eliezer Mccoy PATEL Diagnosing Phys: Yvonne Kendall MD IMPRESSIONS  1. Left ventricular ejection fraction, by estimation, is 25 to 30%. The left ventricle has severely decreased function. The left ventricle demonstrates global hypokinesis. The left ventricular internal cavity size was moderately dilated. There is moderate left ventricular hypertrophy. Left ventricular diastolic function could not be evaluated.  2. Right ventricular systolic function is mildly reduced. The right ventricular size is normal. There is normal pulmonary artery systolic pressure.  3. The mitral valve is normal in structure. Mild mitral valve regurgitation.  4. The aortic valve has been repaired/replaced. Aortic valve regurgitation is not visualized. There is a 27 mm St. Jude bileaflet valve present in the aortic position. Echo findings are consistent with normal structure and function of the aortic valve prosthesis. Aortic valve mean gradient measures 5.0 mmHg.  5. Aortic root/ascending aorta has been repaired/replaced.  6. The inferior vena cava is normal in size with greater than 50% respiratory variability, suggesting right atrial pressure of 3 mmHg. FINDINGS  Left Ventricle: Left ventricular ejection fraction, by estimation, is 25 to 30%. The left ventricle has severely decreased function. The left ventricle demonstrates global hypokinesis. The left ventricular internal cavity size was moderately dilated. There is moderate left ventricular hypertrophy. Abnormal (paradoxical) septal motion, consistent with left bundle branch block. Left ventricular diastolic function could not be  evaluated due to atrial fibrillation. Left ventricular diastolic function could not be evaluated. Right Ventricle: The right ventricular size is normal. No increase in right ventricular wall thickness. Right ventricular systolic function is mildly reduced. There is normal pulmonary artery systolic pressure. The tricuspid regurgitant velocity is 2.20 m/s, and with an assumed right atrial pressure of 3 mmHg, the estimated right ventricular systolic pressure is 22.4 mmHg. Left Atrium: Left atrial size was normal in size. Right Atrium: Right atrial size was normal in size. Pericardium: There is no evidence of pericardial effusion. Mitral Valve: The mitral valve is normal in structure. Mild mitral valve regurgitation. MV peak gradient, 5.2 mmHg. The mean mitral valve gradient is 2.0 mmHg. Tricuspid Valve: The tricuspid valve is not well visualized. Tricuspid valve regurgitation is trivial. Aortic Valve: The aortic valve has been repaired/replaced. Aortic valve regurgitation is not visualized. Aortic valve mean gradient measures 5.0 mmHg. Aortic valve peak gradient measures 9.8 mmHg. Aortic valve area, by VTI measures 2.95 cm. There is a 27 mm St. Jude bileaflet valve present in the aortic position. Echo findings are consistent with normal structure and function of the aortic valve prosthesis. Pulmonic Valve: The pulmonic valve was not well visualized. Pulmonic valve regurgitation is not visualized. No evidence of pulmonic stenosis. Aorta: The aortic root/ascending aorta has been repaired/replaced. Pulmonary Artery: The pulmonary artery is of normal size. Venous: The inferior vena cava is normal in size with greater than 50% respiratory variability, suggesting right atrial pressure of 3 mmHg. IAS/Shunts: No atrial level shunt detected by color flow Doppler.  LEFT VENTRICLE PLAX 2D LVIDd:         6.20 cm LVIDs:         4.30 cm LV PW:         1.40 cm LV IVS:        1.40 cm LVOT diam:     2.10 cm LV SV:         78 LV SV  Index:   35 LVOT Area:     3.46 cm  LV Volumes (MOD) LV vol d, MOD A2C: 120.5 ml LV vol d, MOD A4C: 113.0 ml LV vol s, MOD A2C: 101.3 ml LV vol s, MOD A4C: 112.6 ml LV SV MOD A2C:     19.1 ml LV SV MOD A4C:     113.0 ml LV SV MOD BP:      0.0 ml RIGHT VENTRICLE RV Basal diam:  3.65 cm RV Mid diam:    3.50 cm RV S prime:     8.05 cm/s TAPSE (M-mode): 1.6 cm LEFT ATRIUM             Index        RIGHT ATRIUM           Index LA diam:        4.50 cm 2.00 cm/m   RA Area:     16.60 cm LA Vol (A2C):   65.3 ml 28.97 ml/m  RA Volume:   44.00 ml  19.52 ml/m LA Vol (A4C):   47.0 ml 20.85 ml/m LA Biplane Vol: 58.4 ml 25.91 ml/m  AORTIC VALVE                     PULMONIC VALVE AV Area (Vmax):    3.04 cm      PV Vmax:       1.03 m/s AV Area (Vmean):   2.95 cm      PV Peak grad:  4.3 mmHg AV Area (VTI):     2.95 cm AV Vmax:           156.33 cm/s AV Vmean:          104.000 cm/s AV VTI:            0.265 m AV Peak Grad:      9.8 mmHg AV Mean Grad:      5.0 mmHg LVOT Vmax:         137.00 cm/s LVOT Vmean:        88.467 cm/s LVOT VTI:          0.225 m LVOT/AV VTI ratio: 0.85  AORTA Ao Root diam: 2.70 cm Ao Asc diam:  3.10 cm MITRAL VALVE               TRICUSPID VALVE MV Area (PHT): 4.49 cm    TR Peak grad:   19.4 mmHg MV Area VTI:   4.38 cm    TR Vmax:        220.00 cm/s MV Peak grad:  5.2 mmHg MV Mean grad:  2.0 mmHg    SHUNTS MV Vmax:       1.14 m/s    Systemic VTI:  0.23 m MV Vmean:      54.1 cm/s   Systemic Diam: 2.10 cm MV Decel Time: 169 msec MV E velocity: 73.90 cm/s Yvonne Kendall MD Electronically signed by Yvonne Kendall MD Signature Date/Time: 07/23/2022/5:43:04 PM    Final     Cardiac Studies   Echocardiogram was done on June 25 showed an EF of 25 to 30%, mild mitral regurgitation and normal functioning mechanical aortic valve  Patient Profile     72 y.o. male  with a hx of essential hypertension, dilated cardiomyopathy, s/p aortic valve replacement on chronic anticoagulation with warfarin, obstructive  sleep apnea, gastroesophageal reflux disease, type 2 diabetes, hypercholesterolemia, obesity, longstanding history of palpitations, who is being seen 07/23/2022 for the evaluation of atrial fibrillation RVR and complaints of chest pain at the request of Dr. Allena Katz.    Assessment & Plan    1. Atypical atrial flutter: With RVR. Patient has history of PAF but has generally been in NSR in the past.   Status post successful cardioversion yesterday. I switched him from IV amiodarone to oral 200 mg twice daily which can be decreased to once daily after 10 days. Recommend outpatient EP evaluation for ablation. Continue warfarin for anticoagulation but will send a message to Dr. Para March to get his INR checked in the next 5 days given interaction with amiodarone.  2. Acute on chronic systolic CHF: Prior echo in 1/24 with EF 45-50%.  Has baseline LBBB.  Echo this admission with EF 25-30%, moderate LV dilation with global HK, moderate LVH, mildly decreased RV systolic function, normally functioning mechanical AoV. It is possible that drop in EF is tachycardia-mediated with atypical atrial flutter/RVR of uncertain duration.  He is mildly volume overloaded on exam.  -He  received IV diuresis but there is slight worsening of kidney function.  Diuretics are currently on hold. Recommend resuming furosemide 20 mg once daily starting tomorrow. - Continue Coreg 3.125 mg bid.  -Continue Jardiance 10 mg daily.  -Continue spironolactone 12.5 daily.  -Will hold off on initiation of Entresto given soft blood pressure and worsening renal function.  This should be considered in the near future as an outpatient. - Will need repeat echo a month or so after DCCV to NSR.  If EF improves back to baseline, likely tachycardia-mediated.  If not, would favor cath (would have to bridge with Lovenox off warfarin given mechanical AoV).   3. St Jude mechanical aortic valve: h/o bicuspid aortic valve with AS.  Valve functioned normally on  echo this admission.  - Continue warfarin.   4. Suspect OSA: He snores, has thick neck. Would recommend outpatient sleep study.  Suspect OSA contributes to atrial arrhythmias.   5. Ascending aortic aneurysm: Has history of dilated ascending aorta in setting of bicuspid aortic valve.  Will need some form of thoracic aorta evaluation, can do as outpatient.      Total encounter time more than 50 minutes. Greater than 50% was spent in counseling and coordination of care with the patient.   For questions or updates, please contact  [facility] Please consult [email or web address] for contact info under        Signed, Lorine Bears, MD  07/25/2022, 12:35 PM

## 2022-07-25 NOTE — Consult Note (Addendum)
ANTICOAGULATION CONSULT NOTE   Pharmacy Consult for warfarin Indication: AVR and Afib  Allergies  Allergen Reactions   Ace Inhibitors     REACTION: cough   Aspirin Other (See Comments)    Gastritis.    Other     dexcom patch- local rash- presumed adhesive allergy.     Primaxin [Imipenem W/Cilastatin Sodium]     Rash- but able to take amoxil w/o troubles.    Tape     Rash from bandaids.      Patient Measurements: Height: 5\' 11"  (180.3 cm) Weight: 106.1 kg (234 lb) IBW/kg (Calculated) : 75.3   Vital Signs: Temp: 97.7 F (36.5 C) (06/27 0425) Temp Source: Oral (06/27 0425) BP: 122/71 (06/27 0425) Pulse Rate: 66 (06/27 0425)  Labs: Recent Labs    07/22/22 1555 07/22/22 1755 07/22/22 1836 07/23/22 0510 07/24/22 0458 07/24/22 0730 07/25/22 0453  HGB 14.9  --   --   --   --  15.0  --   HCT 43.9  --   --   --   --  44.0  --   PLT 149*  --   --   --   --  146*  --   LABPROT  --   --    < > 29.3* 27.0*  --  28.0*  INR  --   --    < > 2.7* 2.5*  --  2.6*  CREATININE 1.22  --   --   --   --  1.08 1.28*  TROPONINIHS 71* 70*  --   --   --   --   --    < > = values in this interval not displayed.     Estimated Creatinine Clearance: 66.5 mL/min (A) (by C-G formula based on SCr of 1.28 mg/dL (H)).   Medical History: Past Medical History:  Diagnosis Date   Anemia    Aortic insufficiency    with bicuspid aortic valve (the last MRI demonstrated the aortic root to be 4.8 x 4.7 cm.) Mod regurgitation. Normal chamber size with very mild left ventricular dysfunction.)   Blood transfusion    CHF (congestive heart failure) (HCC)    Clotting disorder (HCC)    Cough 06/28/2009   Dr. Sherene Sires   Diabetes mellitus without complication (HCC)    Duodenal ulcer    with GI bleeding   Dyslipidemia    GERD (gastroesophageal reflux disease) 03/23/2001   egd w/ dilation...Marland KitchenMarland KitchenKaplan   Hemorrhoids    Hyperlipidemia    Hypertension    Necrotizing pancreatitis 09/2009   admission, and  pseudocyst formation, also with ATN   Orthostasis 08/2009   Fever and syncope, no source for fever on cultures.   Pericardial effusion 07/2009   admission to [facility], s/p pericardial window   Pleural effusion 10/2009   s/p thoracentesis   PNA (pneumonia) 10/2009   admnission to [facility], with right pleural effusion, s/p thoracentesis   Sleep apnea    Syncope 08/2009   admission for fever and syncope, no source for fever seen on cultures. Syncope thought to be r/t orthostasis    Medications:  Per Anti-coag clinic visit note from 07/04/22 patient has had an aortic valve replacement and Afib.  INR goal of 2-3.  Dose at that visit was Warfarin 2 mg Mon/Fri and 4 mg AOD.  Confirmed with ED nurse that patient has not had dose today 07/22/22.  Assessment: 72 yo male with PMH including AVR and Afib. Patient presented to ED with palpitations  and chest pain.  Patient found to be in Afib.  Pharmacy consulted for warfarin resumption and dosing.  Interacting medications: Amiodarone  Goal of Therapy:  INR 2-3 Monitor platelets by anticoagulation protocol: Yes   Date INR Warfarin Dose  6/24 2.8 2 mg  6/25 2.7 4 mg  6/26 2.5 4 mg  6/27 2.6 4 mg     Plan:  INR is therapeutic Will give warfarin 4 mg x 1 (home regimen) tonight. Daily INR. CBC at least every 3 days. New start amiodarone, If continued at discharge warfarin dose may need to be adjusted. Ideally with a AVR I will aim for a INR of 2.5 - 3. S/p cardioversion 6/26.   Discharge recommendation: Even with the new start amiodarone, INR has been stable. I recommend continuing the warfarin regimen the same (4 mg all days except 2 mg on Monday and Friday). Pt will need close INR following. Due to the mechanical valve, I would prefer the INR to be on the upper limit of goal.    Ronnald Ramp, PharmD, BCPS 07/25/2022,7:25 AM

## 2022-07-25 NOTE — Telephone Encounter (Signed)
Per PCP, pt also needs CBC and BMET. Would like to schedule pt for Tues, 7/2, with [facility] lab for BMET, CBC, and POCT INR.   Tried to contact pt but had to LVM.   Added orders for CBC and BMET.  Will add order for POCT INR the day of the test.

## 2022-07-25 NOTE — Discharge Summary (Signed)
Physician Discharge Summary   Patient: [name] [name] MRN: [id number] DOB: 1950-12-09  Admit date:     07/22/2022  Discharge date: 07/25/22  Discharge Physician: Arnetha Courser   PCP: Joaquim Nam, MD   Recommendations at discharge:  Please obtain CBC, BMP and INR within next 3 to 5 days. Follow-up with primary care provider Follow-up with cardiology  Discharge Diagnoses: Principal Problem:   Atrial fibrillation with rapid ventricular response (HCC) Active Problems:   Dilated cardiomyopathy (HCC)   Chest pain   S/P AVR   Diabetes mellitus, type II (HCC)   Essential hypertension   Anemia   GERD   OBSTRUCTIVE SLEEP APNEA   Hospital Course:  [name] [name] is a 72 y.o. male with medical history significant for aortic insufficiency, bicuspid aortic valve status post Saint Jude mechanical valve on Coumadin and need for endocarditis prophylaxis, pt sees [facility] cardiology- 2011, diabetes mellitus type 2, anemia with a history of duodenal ulcer and GI bleed, hypertension, necrotizing pancreatitis, pericardial effusion, pneumonia, sleep apnea, hypertension, HFrEF with last EF of 45 to 50%, obesity coming to the hospital for palpitations and chest discomfort.   He was found to be in A-fib with RVR.  Started on amiodarone infusion. Cardiology was consulted.  6/25: Heart rate improved but remained in low 100, BNP 244, INR 2.7.  TSH normal with mildly elevated free T4 at 1.16.  Troponin 71>>70. Cardiology is planning cardioversion tomorrow. Echocardiogram done-pending results.  6/26: Successful cardioversion.  EF of 25 to 30% with global hypokinesis.  Normal structure and function of aortic valve prosthesis.  Advanced heart failure team is on board  6/27: Patient remained in sinus rhythm.  Slight increase in creatinine with diuresis. Cardiology started him on p.o. amiodarone 200 mg twice daily and advised to start taking 20 mg of Lasix from tomorrow.  His home carvedilol dose was  decreased and losartan is currently being held due to softer blood pressure.  His physicians can restart as appropriate. Low-dose spironolactone was also added to optimize the management of heart failure.  Patient need to have his INR checked in next 3 to 5 days due to Coumadin and amiodarone interaction as it might need a dose adjustment.  Patient will continue on current medications and need to have a close follow-up with his providers for further recommendations.  Assessment and Plan: * Atrial fibrillation with rapid ventricular response (HCC) Pt presenting with Palpitations/A-fib RVR Started on amiodarone infusion- -Patient underwent successful cardioversion today -Continue with amiodarone infusion -Continue with Coumadin   Chest pain  Substernal Chest pain presentation.  Mild troponin elevation secondary to demand ischemia with increased heart rate and A-fib RVR. Cardiology consulted. No st t wave changes.  .  Nitroglycerin, as needed morphine, IV PPI therapy. -Echocardiogram done-pending results  Dilated cardiomyopathy (HCC) Further decrease in EF to 20 to 25% on repeat echo.  It was 40 to 45% a year ago. Likely secondary to A-fib with RVR Advanced heart failure team is on board -Started on IV Lasix 40 mg twice daily -Started on Jardiance and spironolactone  S/P AVR Pharmacy consult for coumadin for mechanical valve and a.fib .  Diabetes mellitus, type II (HCC) CBG within goal, A1c of 7.7. Patient was on metformin, glipizide and Jardiance at home -Continue with SSI   Essential hypertension Blood pressures within goal.  Patient said that_syncope and dizziness due to low blood pressure with blood pressure medications Jardiance and intermittent diuretic therapy as needed. Will defer to PCP to adjust  her blood pressure medication regimen.  Patient is stable.  Of any dizziness or syncopal symptoms.  Anemia History of anemia/ GIB/ esophageal stenosis: Hemoglobin currently  at 14.9 and stable IV PPI therapy.    GERD With h/o GIB. PPI therapy.  Aspiration and fall precaution.   OBSTRUCTIVE SLEEP APNEA Advised pt to obtain oral appliance or find a treatment plan and d/w PCP.   Consultants: Cardiology Procedures performed: Cardioversion Disposition: Home Diet recommendation:  Discharge Diet Orders (From admission, onward)     Start     Ordered   07/25/22 0000  Diet - low sodium heart healthy        07/25/22 1312           Cardiac and Carb modified diet DISCHARGE MEDICATION: Allergies as of 07/25/2022       Reactions   Ace Inhibitors    REACTION: cough   Aspirin Other (See Comments)   Gastritis.    Other    dexcom patch- local rash- presumed adhesive allergy.     Primaxin [imipenem W/cilastatin Sodium]    Rash- but able to take amoxil w/o troubles.    Tape    Rash from bandaids.          Medication List     STOP taking these medications    amoxicillin 500 MG tablet Commonly known as: AMOXIL       TAKE these medications    Accu-Chek Aviva Plus test strip Generic drug: glucose blood USE AS DIRECTED TO CHECK BLOOD SUGAR 2 TO 4 TIMES A DAY   Accu-Chek FastClix Lancet Kit AS DIRECTED   Accu-Chek FastClix Lancets Misc USE AS DIRECTED TO CHECK BLOOD SUGAR TWICE DAILY   amiodarone 200 MG tablet Commonly known as: PACERONE Take 1 tablet (200 mg total) by mouth 2 (two) times daily.   azelastine 0.1 % nasal spray Commonly known as: ASTELIN Place 2 sprays into both nostrils 2 (two) times daily as needed for rhinitis. Use in each nostril as directed   carvedilol 3.125 MG tablet Commonly known as: COREG Take 1 tablet (3.125 mg total) by mouth 2 (two) times daily with a meal. What changed:  medication strength See the new instructions.   cetirizine 10 MG tablet Commonly known as: ZYRTEC Take 10 mg by mouth daily.   empagliflozin 10 MG Tabs tablet Commonly known as: Jardiance Take 1 tablet (10 mg total) by mouth  daily before breakfast.   fluocinonide cream 0.05 % Commonly known as: LIDEX APPLY 1 APPLICATION TO AFFECTEDE AREAS 2 TIMES DAILY AS NEEDED   furosemide 20 MG tablet Commonly known as: LASIX Take 1 tablet (20 mg total) by mouth daily.   glipiZIDE 5 MG tablet Commonly known as: GLUCOTROL TAKE 2 TABLETS BY MOUTH DAILY BEFORE BREAKFAST IF AM SUGAR IS ABOVE 150 What changed: See the new instructions.   losartan 100 MG tablet Commonly known as: COZAAR TAKE 1 TABLET BY MOUTH ONCE A DAY , hold until you see your cardiologist What changed: See the new instructions.   metFORMIN 500 MG tablet Commonly known as: GLUCOPHAGE Take 2 tablets (1,000 mg total) by mouth 2 (two) times daily.   omeprazole 20 MG capsule Commonly known as: PRILOSEC TAKE ONE CAPSULE BY MOUTH ONCE DAILY What changed: when to take this   potassium chloride 10 MEQ tablet Commonly known as: KLOR-CON Take 1 tablet (10 mEq total) by mouth daily. TAKE ONE TABLET BY MOUTH TWICE A DAY What changed:  how much to take how to  take this when to take this   pravastatin 20 MG tablet Commonly known as: PRAVACHOL TAKE ONE TABLET BY MOUTH ONCE A DAY   psyllium 58.6 % packet Commonly known as: METAMUCIL Take 1 packet by mouth daily.   spironolactone 25 MG tablet Commonly known as: ALDACTONE Take 0.5 tablets (12.5 mg total) by mouth daily. Start taking on: July 26, 2022   tamsulosin 0.4 MG Caps capsule Commonly known as: FLOMAX Take 1 capsule (0.4 mg total) by mouth daily. What changed: when to take this   WARFARIN SODIUM PO Take as directed. If you are unsure how to take this medication, talk to your nurse or doctor. Original instructions: Take 2-4 mg by mouth See admin instructions. Take 4mg  by mouth every Tuesday, Wednesday, Thursday, Saturday and Sunday night and take 2mg by mouth every Monday and Friday night What changed: Another medication with the same name was removed. Continue taking this medication, and  follow the directions you see here.        Follow-up Information     Duncan, Graham S, MD. Schedule an appointment as soon as possible for a visit in 1 week(s).   Specialty: Family Medicine Contact information: [address] [place] [address] [phone]         Hochrein, James, MD. Schedule an appointment as soon as possible for a visit in 1 week(s).   Specialty: Cardiology Contact information: [address] [place] [address] [phone]                Discharge Exam: Filed Weights   07/22/22 1550  Weight: 106.1 kg   General.  Well-developed gentleman, in no acute distress. Pulmonary.  Lungs clear bilaterally, normal respiratory effort. CV.  Regular rate and rhythm, no JVD, rub or murmur. Abdomen.  Soft, nontender, nondistended, BS positive. CNS.  Alert and oriented .  No focal neurologic deficit. Extremities.  No edema, no cyanosis, pulses intact and symmetrical. Psychiatry.  Judgment and insight appears normal.   Condition at discharge: stable  The results of significant diagnostics from this hospitalization (including imaging, microbiology, ancillary and laboratory) are listed below for reference.   Imaging Studies: ECHOCARDIOGRAM COMPLETE  Result Date: 07/23/2022    ECHOCARDIOGRAM REPORT   Patient Name:   Takota F Valls Date of Exam: 07/23/2022 Medical Rec #:  [id number]          Height:       71.0 in Accession #:    [id number]         Weight:       234.0 lb Date of Birth:  06/21/1950          BSA:          2.254 m Patient Age:    72 years           BP:           128/87 mmHg Patient Gender: M                  HR:           11 7 bpm. Exam Location:  [facility] Procedure: 2D Echo Indications:     Atrial fibrillation                  Chest pain  History:         Patient has prior history of Echocardiogram examinations, most  recent 02/16/2021. CHF, Aortic Valve Disease, Arrythmias:Atrial                  Fibrillation;  Signs/Symptoms:Chest Pain and palpitations.                  Aortic Valve: 27 mm St. Jude bileaflet valve is present in the                  aortic position.  Sonographer:     Mikki Harbor Referring Phys:  [id number] Eliezer Mccoy PATEL Diagnosing Phys: Yvonne Kendall MD IMPRESSIONS  1. Left ventricular ejection fraction, by estimation, is 25 to 30%. The left ventricle has severely decreased function. The left ventricle demonstrates global hypokinesis. The left ventricular internal cavity size was moderately dilated. There is moderate left ventricular hypertrophy. Left ventricular diastolic function could not be evaluated.  2. Right ventricular systolic function is mildly reduced. The right ventricular size is normal. There is normal pulmonary artery systolic pressure.  3. The mitral valve is normal in structure. Mild mitral valve regurgitation.  4. The aortic valve has been repaired/replaced. Aortic valve regurgitation is not visualized. There is a 27 mm St. Jude bileaflet valve present in the aortic position. Echo findings are consistent with normal structure and function of the aortic valve prosthesis. Aortic valve mean gradient measures 5.0 mmHg.  5. Aortic root/ascending aorta has been repaired/replaced.  6. The inferior vena cava is normal in size with greater than 50% respiratory variability, suggesting right atrial pressure of 3 mmHg. FINDINGS  Left Ventricle: Left ventricular ejection fraction, by estimation, is 25 to 30%. The left ventricle has severely decreased function. The left ventricle demonstrates global hypokinesis. The left ventricular internal cavity size was moderately dilated. There is moderate left ventricular hypertrophy. Abnormal (paradoxical) septal motion, consistent with left bundle branch block. Left ventricular diastolic function could not be evaluated due to atrial fibrillation. Left ventricular diastolic function could not be evaluated. Right Ventricle: The right ventricular size is normal.  No increase in right ventricular wall thickness. Right ventricular systolic function is mildly reduced. There is normal pulmonary artery systolic pressure. The tricuspid regurgitant velocity is 2.20 m/s, and with an assumed right atrial pressure of 3 mmHg, the estimated right ventricular systolic pressure is 22.4 mmHg. Left Atrium: Left atrial size was normal in size. Right Atrium: Right atrial size was normal in size. Pericardium: There is no evidence of pericardial effusion. Mitral Valve: The mitral valve is normal in structure. Mild mitral valve regurgitation. MV peak gradient, 5.2 mmHg. The mean mitral valve gradient is 2.0 mmHg. Tricuspid Valve: The tricuspid valve is not well visualized. Tricuspid valve regurgitation is trivial. Aortic Valve: The aortic valve has been repaired/replaced. Aortic valve regurgitation is not visualized. Aortic valve mean gradient measures 5.0 mmHg. Aortic valve peak gradient measures 9.8 mmHg. Aortic valve area, by VTI measures 2.95 cm. There is a 27 mm St. Jude bileaflet valve present in the aortic position. Echo findings are consistent with normal structure and function of the aortic valve prosthesis. Pulmonic Valve: The pulmonic valve was not well visualized. Pulmonic valve regurgitation is not visualized. No evidence of pulmonic stenosis. Aorta: The aortic root/ascending aorta has been repaired/replaced. Pulmonary Artery: The pulmonary artery is of normal size. Venous: The inferior vena cava is normal in size with greater than 50% respiratory variability, suggesting right atrial pressure of 3 mmHg. IAS/Shunts: No atrial level shunt detected by color flow Doppler.  LEFT VENTRICLE PLAX 2D LVIDd:  6.20 cm LVIDs:         4.30 cm LV PW:         1.40 cm LV IVS:        1.40 cm LVOT diam:     2.10 cm LV SV:         78 LV SV Index:   35 LVOT Area:     3.46 cm  LV Volumes (MOD) LV vol d, MOD A2C: 120.5 ml LV vol d, MOD A4C: 113.0 ml LV vol s, MOD A2C: 101.3 ml LV vol s, MOD A4C:  112.6 ml LV SV MOD A2C:     19.1 ml LV SV MOD A4C:     113.0 ml LV SV MOD BP:      0.0 ml RIGHT VENTRICLE RV Basal diam:  3.65 cm RV Mid diam:    3.50 cm RV S prime:     8.05 cm/s TAPSE (M-mode): 1.6 cm LEFT ATRIUM             Index        RIGHT ATRIUM           Index LA diam:        4.50 cm 2.00 cm/m   RA Area:     16.60 cm LA Vol (A2C):   65.3 ml 28.97 ml/m  RA Volume:   44.00 ml  19.52 ml/m LA Vol (A4C):   47.0 ml 20.85 ml/m LA Biplane Vol: 58.4 ml 25.91 ml/m  AORTIC VALVE                     PULMONIC VALVE AV Area (Vmax):    3.04 cm      PV Vmax:       1.03 m/s AV Area (Vmean):   2.95 cm      PV Peak grad:  4.3 mmHg AV Area (VTI):     2.95 cm AV Vmax:           156.33 cm/s AV Vmean:          104.000 cm/s AV VTI:            0.265 m AV Peak Grad:      9.8 mmHg AV Mean Grad:      5.0 mmHg LVOT Vmax:         137.00 cm/s LVOT Vmean:        88.467 cm/s LVOT VTI:          0.225 m LVOT/AV VTI ratio: 0.85  AORTA Ao Root diam: 2.70 cm Ao Asc diam:  3.10 cm MITRAL VALVE               TRICUSPID VALVE MV Area (PHT): 4.49 cm    TR Peak grad:   19.4 mmHg MV Area VTI:   4.38 cm    TR Vmax:        220.00 cm/s MV Peak grad:  5.2 mmHg MV Mean grad:  2.0 mmHg    SHUNTS MV Vmax:       1.14 m/s    Systemic VTI:  0.23 m MV Vmean:      54.1 cm/s   Systemic Diam: 2.10 cm MV Decel Time: 169 msec MV E velocity: 73.90 cm/s Yvonne Kendall MD Electronically signed by Yvonne Kendall MD Signature Date/Time: 07/23/2022/5:43:04 PM    Final    DG Chest Port 1 View  Result Date: 07/22/2022 CLINICAL DATA:  Chest pain.  Tachycardia.  Atrial fibrillation. EXAM: PORTABLE CHEST  1 VIEW COMPARISON:  04/17/2017 FINDINGS: The heart size and mediastinal contours are within normal limits. Prior median sternotomy and aortic valve replacement again noted. New diffuse interstitial infiltrates are seen. Tiny right pleural effusion also noted. No evidence of pulmonary consolidation. IMPRESSION: New diffuse interstitial infiltrates and tiny right  pleural effusion, consistent with congestive heart failure. Electronically Signed   By: Danae Orleans M.D.   On: 07/22/2022 16:56    Microbiology: Results for orders placed or performed in visit on 04/04/22  Urine Culture     Status: None   Collection Time: 04/04/22 12:51 PM   Specimen: Urine  Result Value Ref Range Status   MICRO NUMBER: [id number]  Final   SPECIMEN QUALITY: Adequate  Final   Sample Source URINE  Final   STATUS: FINAL  Final   Result:   Final    Less than 10,000 CFU/mL of single Gram positive organism isolated. No further testing will be performed. If clinically indicated, recollection using a method to minimize contamination, with prompt transfer to Urine Culture Transport Tube, is recommended.    Labs: CBC: Recent Labs  Lab 07/22/22 1555 07/24/22 0730  WBC 7.7 5.6  HGB 14.9 15.0  HCT 43.9 44.0  MCV 92.0 90.2  PLT 149* 146*   Basic Metabolic Panel: Recent Labs  Lab 07/22/22 1555 07/22/22 1755 07/24/22 0730 07/25/22 0453  NA 138  --  135 134*  K 4.6  --  3.8 3.8  CL 106  --  104 102  CO2 24  --  22 23  GLUCOSE 213*  --  180* 208*  BUN 16  --  19 23  CREATININE 1.22  --  1.08 1.28*  CALCIUM 9.0  --  8.5* 8.8*  MG  --  2.1  --   --    Liver Function Tests: Recent Labs  Lab 07/22/22 1555  AST 28  ALT 22  ALKPHOS 78  BILITOT 1.1  PROT 6.9  ALBUMIN 3.9   CBG: Recent Labs  Lab 07/24/22 1150 07/24/22 1643 07/24/22 2116 07/25/22 0833 07/25/22 1138  GLUCAP 149* 186* 149* 181* 221*    Discharge time spent: greater than 30 minutes.  This record has been created using Conservation officer, historic buildings. Errors have been sought and corrected,but may not always be located. Such creation errors do not reflect on the standard of care.   Signed: Arnetha Courser, MD [facility] 07/25/2022

## 2022-07-25 NOTE — Telephone Encounter (Signed)
-----   Message from Joaquim Nam, MD sent at 07/25/2022  1:45 PM EDT ----- See below.  Should I put in the order for INR here or can you arrange for this?  Many thanks.   Clelia Croft  ----- Message ----- From: Iran Ouch, MD Sent: 07/25/2022  12:49 PM EDT To: Rollene Rotunda, MD; Joaquim Nam, MD  Dr. Para March,  This patient is being discharged home today after hospitalization with acute systolic heart failure and atypical atrial flutter.  He was started on amiodarone and underwent successful cardioversion yesterday. Due to interaction with warfarin, can you please arrange for him to have his INR checked within 5 days?  Thanks.  Jake,  His EF is 25 to 30% and he was seen by Freida Busman.  He will follow-up with advanced heart failure clinic in the short-term.  Thanks.

## 2022-07-25 NOTE — Telephone Encounter (Signed)
Unable to reach patient to schedule a post hospital follow-up, did leave message for patient to call clinic at earliest convenience to schedule appointment.

## 2022-07-25 NOTE — TOC Benefit Eligibility Note (Signed)
Pharmacy Patient Advocate Encounter  Insurance verification completed.    The patient is insured through [facility] Medicare Part D  Ran test claim for Entresto 24-26 mg and the current 30 day co-pay is $40.00.   This test claim was processed through [facility]- copay amounts may vary at other pharmacies due to pharmacy/plan contracts, or as the patient moves through the different stages of their insurance plan.    Roland Earl, CPHT Pharmacy Patient Advocate Specialist [facility] Patient Advocate Team Direct Number: [phone]  Fax: [phone]

## 2022-07-26 ENCOUNTER — Ambulatory Visit: Payer: Self-pay

## 2022-07-26 ENCOUNTER — Telehealth: Payer: Self-pay

## 2022-07-26 NOTE — Telephone Encounter (Signed)
Contacted pt who reports he is doing much better now. Advised pt of interaction with warfarin and amiodarone. Advised it increases the risk of bleeding and if he had any s/s of abnormal bruising or bleeding to go to the ER. Pt reports he has already made a hospital f/u apt with PCP on 7/2 and requested INR check. Advised the lab will draw CBC and BMET and hopefully they can perform a POCT INR so we have the results immediately. Advised this nurse will f/u by phone on Tuesday, 7/2, with warfarin dosing instructions. Pt verbalized understanding and was appreciative of the call.   Order for CBC and BMET have already been entered. Will place order for POCT INR when anticoagulation encounter is created on 7/2 for pt.

## 2022-07-26 NOTE — Chronic Care Management (AMB) (Signed)
   07/26/2022  [name] [name] [birth year] [id number]   Reason for Encounter: Patient is not currently enrolled in the CCM program. CCM enrollment status changed to "Previously enrolled"   Katha Cabal [facility]/ Chronic Care Management [phone]

## 2022-07-26 NOTE — Transitions of Care (Post Inpatient/ED Visit) (Signed)
07/26/2022  Name: [name] [name] MRN: [id number] DOB: 1950/07/22  Today's TOC FU Call Status: Today's TOC FU Call Status:: Successful TOC FU Call Competed TOC FU Call Complete Date: 07/26/22  Transition Care Management Follow-up Telephone Call Date of Discharge: 07/25/22 Discharge Facility: [facility]) Primary Inpatient Discharge Diagnosis:: Atrial Fibrillation How have you been since you were released from the hospital?: Better Any questions or concerns?: Yes Patient Questions/Concerns:: Patient had a question about his Spironolactone and if he should cut the pill in half.  Order for 12.5 mg and pill is 25 mg so confirmed with patient  Items Reviewed: Did you receive and understand the discharge instructions provided?: Yes Medications obtained,verified, and reconciled?: Yes (Medications Reviewed) Any new allergies since your discharge?: No Dietary orders reviewed?: Yes Type of Diet Ordered:: Low sudium, hearl healthy Do you have support at home?: Yes People in Home: spouse Name of Support/Comfort Primary Source: [name]  Medications Reviewed Today: Medications Reviewed Today     Reviewed by Jodelle Gross, RN (Case Manager) on 07/26/22 at 1242  Med List Status: <None>   Medication Order Taking? Sig Documenting Provider Last Dose Status Informant  ACCU-CHEK AVIVA PLUS test strip [id number] Yes USE AS DIRECTED TO CHECK BLOOD SUGAR 2 TO 4 TIMES A DAY Joaquim Nam, MD Taking Active Self  Accu-Chek FastClix Lancets MISC [id number] Yes USE AS DIRECTED TO CHECK BLOOD SUGAR TWICE DAILY Joaquim Nam, MD Taking Active Self  amiodarone (PACERONE) 200 MG tablet [id number] Yes Take 1 tablet (200 mg total) by mouth 2 (two) times daily. Arnetha Courser, MD Taking Active   azelastine (ASTELIN) 0.1 % nasal spray [id number] Yes Place 2 sprays into both nostrils 2 (two) times daily as needed for rhinitis. Use in each nostril as directed Joaquim Nam, MD Taking  Active Self  carvedilol (COREG) 3.125 MG tablet [id number] Yes Take 1 tablet (3.125 mg total) by mouth 2 (two) times daily with a meal. Arnetha Courser, MD Taking Active   cetirizine (ZYRTEC) 10 MG tablet [id number] Yes Take 10 mg by mouth daily. [provider] Taking Active Self  empagliflozin (JARDIANCE) 10 MG TABS tablet [id number] Yes Take 1 tablet (10 mg total) by mouth daily before breakfast. Rollene Rotunda, MD Taking Active Self, Pharmacy Records  fluocinonide cream (LIDEX) 0.05 % [id number] Yes APPLY 1 APPLICATION TO AFFECTEDE AREAS 2 TIMES DAILY AS NEEDED Joaquim Nam, MD Taking Active Self  furosemide (LASIX) 20 MG tablet [id number] Yes Take 1 tablet (20 mg total) by mouth daily. Arnetha Courser, MD Taking Active   glipiZIDE (GLUCOTROL) 5 MG tablet [id number] Yes TAKE 2 TABLETS BY MOUTH DAILY BEFORE BREAKFAST IF AM SUGAR IS ABOVE 150  Patient taking differently: Take 5-10 mg by mouth See admin instructions. Take 1 tablet (5mg ) by mouth every morning before breakfast - take 2 tablets (10mg ) by mouth before breakfast if BG >150   Joaquim Nam, MD Taking Active Self, Pharmacy Records  Lancets Misc. (ACCU-CHEK FASTCLIX LANCET) KIT [id number] Yes AS DIRECTED Joaquim Nam, MD Taking Active Self  losartan (COZAAR) 100 MG tablet [id number] Yes TAKE 1 TABLET BY MOUTH ONCE A DAY , hold until you see your cardiologist Arnetha Courser, MD Taking Active   metFORMIN (GLUCOPHAGE) 500 MG tablet [id number] Yes Take 2 tablets (1,000 mg total) by mouth 2 (two) times daily. Joaquim Nam, MD Taking Active Self, Pharmacy Records  omeprazole (PRILOSEC) 20 MG capsule [id number] Yes TAKE ONE CAPSULE BY MOUTH ONCE DAILY  Patient taking differently: Take 20 mg by mouth daily before breakfast.   Joaquim Nam, MD Taking Active Self, Pharmacy Records  potassium chloride (KLOR-CON) 10 MEQ tablet [id number] Yes Take 1 tablet (10 mEq total) by mouth daily. TAKE ONE TABLET BY MOUTH TWICE A Chinita Greenland, MD  Taking Active   pravastatin (PRAVACHOL) 20 MG tablet [id number] Yes TAKE ONE TABLET BY MOUTH ONCE A DAY Joaquim Nam, MD Taking Active Self, Pharmacy Records  psyllium (METAMUCIL) 58.6 % packet [id number] Yes Take 1 packet by mouth daily. [provider] Taking Active Self  spironolactone (ALDACTONE) 25 MG tablet [id number] Yes Take 0.5 tablets (12.5 mg total) by mouth daily. Arnetha Courser, MD Taking Active   tamsulosin [facility]) 0.4 MG CAPS capsule [id number] Yes Take 1 capsule (0.4 mg total) by mouth daily.  Patient taking differently: Take 0.4 mg by mouth daily before breakfast.   Joaquim Nam, MD Taking Active Self, Pharmacy Records  WARFARIN SODIUM PO [id number] Yes Take 2-4 mg by mouth See admin instructions. Take 4mg  by mouth every Tuesday, Wednesday, Thursday, Saturday and Sunday night and take 2mg  by mouth every Monday and Friday night [provider] Taking Active Self           Med Note GONSALO, BARQUIN Jul 22, 2022 10:28 PM) As of 07/22/2022            Home Care and Equipment/Supplies: Were Home Health Services Ordered?: No Any new equipment or medical supplies ordered?: No  Functional Questionnaire: Do you need assistance with bathing/showering or dressing?: No Do you need assistance with meal preparation?: No Do you need assistance with eating?: No Do you need assistance with getting out of bed/getting out of a chair/moving?: No Do you have difficulty managing or taking your medications?: No  Follow up appointments reviewed: PCP Follow-up appointment confirmed?: Yes Date of PCP follow-up appointment?: 07/30/22 Follow-up Provider: Dr. Para March [facility] Follow-up appointment confirmed?: Yes Date of Specialist follow-up appointment?: 08/09/22 Follow-Up Specialty Provider:: Dr. Shirlee Latch Do you need transportation to your follow-up appointment?: No Do you understand care options if your condition(s) worsen?: Yes-patient verbalized  understanding  SDOH Interventions Today    Flowsheet Row Most Recent Value  SDOH Interventions   Food Insecurity Interventions Intervention Not Indicated  Housing Interventions Intervention Not Indicated  Transportation Interventions Intervention Not Indicated  Utilities Interventions Intervention Not Indicated  Financial Strain Interventions Intervention Not Indicated     Jodelle Gross, RN, BSN, CCM Care Management Coordinator [facility] Phone: [phone]/Fax: [phone]

## 2022-07-28 NOTE — Telephone Encounter (Signed)
Noted. Thanks.

## 2022-07-30 ENCOUNTER — Encounter: Payer: Self-pay | Admitting: Family Medicine

## 2022-07-30 ENCOUNTER — Ambulatory Visit (INDEPENDENT_AMBULATORY_CARE_PROVIDER_SITE_OTHER): Payer: Medicare PPO

## 2022-07-30 ENCOUNTER — Ambulatory Visit: Payer: Medicare PPO | Admitting: Family Medicine

## 2022-07-30 VITALS — BP 124/78 | HR 69 | Temp 97.5°F | Ht 71.0 in | Wt 232.0 lb

## 2022-07-30 DIAGNOSIS — Z7901 Long term (current) use of anticoagulants: Secondary | ICD-10-CM

## 2022-07-30 DIAGNOSIS — I10 Essential (primary) hypertension: Secondary | ICD-10-CM | POA: Diagnosis not present

## 2022-07-30 DIAGNOSIS — G4730 Sleep apnea, unspecified: Secondary | ICD-10-CM

## 2022-07-30 DIAGNOSIS — I4891 Unspecified atrial fibrillation: Secondary | ICD-10-CM

## 2022-07-30 LAB — BASIC METABOLIC PANEL
BUN: 16 mg/dL (ref 6–23)
CO2: 24 mEq/L (ref 19–32)
Calcium: 9.8 mg/dL (ref 8.4–10.5)
Chloride: 103 mEq/L (ref 96–112)
Creatinine, Ser: 1.25 mg/dL (ref 0.40–1.50)
GFR: 58.38 mL/min — ABNORMAL LOW (ref >60.00)
Glucose, Bld: 124 mg/dL — ABNORMAL HIGH (ref 70–99)
Potassium: 4.3 mEq/L (ref 3.5–5.1)
Sodium: 137 mEq/L (ref 135–145)

## 2022-07-30 LAB — POCT INR: INR: 3.6 — AB (ref 2.0–3.0)

## 2022-07-30 LAB — CBC
HCT: 45 % (ref 39.0–52.0)
Hemoglobin: 14.9 g/dL (ref 13.0–17.0)
MCHC: 33.2 g/dL (ref 30.0–36.0)
MCV: 93 fl (ref 78.0–100.0)
Platelets: 195 10*3/uL (ref 150.0–400.0)
RBC: 4.84 Mil/uL (ref 4.22–5.81)
RDW: 15.1 % (ref 11.5–15.5)
WBC: 5.3 10*3/uL (ref 4.0–10.5)

## 2022-07-30 MED ORDER — AZELASTINE HCL 0.1 % NA SOLN: 2.0000 | Freq: Two times a day (BID) | NASAL | 12 refills | Status: DC | PRN

## 2022-07-30 MED ORDER — BENZONATATE 200 MG PO CAPS
200.0000 mg | ORAL_CAPSULE | Freq: Three times a day (TID) | ORAL | 2 refills | Status: DC | PRN
Start: 2022-07-30 — End: 2022-11-07

## 2022-07-30 NOTE — Addendum Note (Signed)
Addended by: Trellis Paganini D on: 07/30/2022 11:15 AM   Modules accepted: Orders

## 2022-07-30 NOTE — Progress Notes (Signed)
Pt admitted to hospital on 6/24 for Atrial fibrillation with rapid ventricular response and was d/c with prescription of amiodarone. Pt is currently prescribed 200 mg BID.  Hold dose today and then change weekly dose to take 4 mg daily except take 2 mg on Monday, Wednesday and Friday. Recheck in 1 week.  Contacted pt by phone and advised. Advised to watch for s/s of abnormal bruising or bleeding and if he has any to go to the ER. Pt read back instructions and verbalized understanding.

## 2022-07-30 NOTE — Progress Notes (Unsigned)
Recommendations at discharge:  Please obtain CBC, BMP and INR within next 3 to 5 days. Follow-up with primary care provider Follow-up with cardiology   Discharge Diagnoses: Principal Problem:   Atrial fibrillation with rapid ventricular response (HCC) Active Problems:   Dilated cardiomyopathy (HCC)   Chest pain   S/P AVR   Diabetes mellitus, type II (HCC)   Essential hypertension   Anemia   GERD   OBSTRUCTIVE SLEEP APNEA     Hospital Course:  [name] [name] is a 72 y.o. male with medical history significant for aortic insufficiency, bicuspid aortic valve status post Saint Jude mechanical valve on Coumadin and need for endocarditis prophylaxis, pt sees [facility] cardiology- 2011, diabetes mellitus type 2, anemia with a history of duodenal ulcer and GI bleed, hypertension, necrotizing pancreatitis, pericardial effusion, pneumonia, sleep apnea, hypertension, HFrEF with last EF of 45 to 50%, obesity coming to the hospital for palpitations and chest discomfort.    He was found to be in A-fib with RVR.  Started on amiodarone infusion. Cardiology was consulted.   6/25: Heart rate improved but remained in low 100, BNP 244, INR 2.7.  TSH normal with mildly elevated free T4 at 1.16.  Troponin 71>>70. Cardiology is planning cardioversion tomorrow. Echocardiogram done-pending results.   6/26: Successful cardioversion.  EF of 25 to 30% with global hypokinesis.  Normal structure and function of aortic valve prosthesis.  Advanced heart failure team is on board   6/27: Patient remained in sinus rhythm.  Slight increase in creatinine with diuresis. Cardiology started him on p.o. amiodarone 200 mg twice daily and advised to start taking 20 mg of Lasix from tomorrow.  His home carvedilol dose was decreased and losartan is currently being held due to softer blood pressure.  His physicians can restart as appropriate. Low-dose spironolactone was also added to optimize the management of heart failure.    Patient need to have his INR checked in next 3 to 5 days due to Coumadin and amiodarone interaction as it might need a dose adjustment.   Patient will continue on current medications and need to have a close follow-up with his providers for further recommendations.   Assessment and Plan: * Atrial fibrillation with rapid ventricular response (HCC) Pt presenting with Palpitations/A-fib RVR Started on amiodarone infusion- -Patient underwent successful cardioversion today -Continue with amiodarone infusion -Continue with Coumadin     Chest pain  Substernal Chest pain presentation.  Mild troponin elevation secondary to demand ischemia with increased heart rate and A-fib RVR. Cardiology consulted. No st t wave changes.  .  Nitroglycerin, as needed morphine, IV PPI therapy. -Echocardiogram done-pending results   Dilated cardiomyopathy (HCC) Further decrease in EF to 20 to 25% on repeat echo.  It was 40 to 45% a year ago. Likely secondary to A-fib with RVR Advanced heart failure team is on board -Started on IV Lasix 40 mg twice daily -Started on Jardiance and spironolactone   S/P AVR Pharmacy consult for coumadin for mechanical valve and a.fib .   Diabetes mellitus, type II (HCC) CBG within goal, A1c of 7.7. Patient was on metformin, glipizide and Jardiance at home -Continue with SSI     Essential hypertension Blood pressures within goal.  Patient said that_syncope and dizziness due to low blood pressure with blood pressure medications Jardiance and intermittent diuretic therapy as needed. Will defer to PCP to adjust her blood pressure medication regimen.  Patient is stable.  Of any dizziness or syncopal symptoms.   Anemia History of anemia/  GIB/ esophageal stenosis: Hemoglobin currently at 14.9 and stable IV PPI therapy.      GERD With h/o GIB. PPI therapy.  Aspiration and fall precaution.    OBSTRUCTIVE SLEEP APNEA Advised pt to obtain oral appliance or find a  treatment plan and d/w PCP.    ========================== Inpatient f/u.  Atrial fibrillation pathophysiology discussed with patient.  Status post cardioversion.  Discussed with patient about anticoagulation and amiodarone use.  D/w pt about getting f/u labs done, INR, etc.    He needed refill on tessalon (used rarely) and astelin.    Currently taking 4mg  coumadin everyday except for 2mg  of Monday and Friday.    He is not yet on losartan.  D/w pt about recheck labs.  BP still at goal.  S/p cardioversion.    No CP. Not SOB.  He feels betters.    OSA, he has not been able to wear his mask.  Needs pulmonary eval. referral placed.  Meds, vitals, and allergies reviewed.   ROS: Per HPI unless specifically indicated in ROS section   [place]ad [place] Neck supple, no lA ctab RRR Abdomen soft. No BLE edema.  Skin well-perfused.

## 2022-07-30 NOTE — Patient Instructions (Signed)
Pre visit review using our clinic review tool, if applicable. No additional management support is needed unless otherwise documented below in the visit note. 

## 2022-07-30 NOTE — Patient Instructions (Addendum)
Go to the lab on the way out.   If you have mychart we'll likely use that to update you.    Take care.  Glad to see you. You should get a call about your INR results.    Don't restart losartan yet.

## 2022-07-31 NOTE — Assessment & Plan Note (Signed)
History of, status post cardioversion and sounds to be in normal sinus rhythm today.  Rationale for current medications discussed with patient, especially amiodarone.  INR pending today.  See notes on follow-up labs.  Routine cautions given to patient.  Discussed sleep apnea evaluation, especially in the setting of previous atrial fibrillation.  Refer to pulmonary.  He agrees to plan.

## 2022-07-31 NOTE — Assessment & Plan Note (Signed)
Blood pressure is controlled so would defer restarting losartan at this point.  He has cardiology follow-up pending.

## 2022-07-31 NOTE — Assessment & Plan Note (Signed)
See notes on follow-up INR.

## 2022-07-31 NOTE — Assessment & Plan Note (Signed)
Refer to pulmonary

## 2022-08-06 ENCOUNTER — Ambulatory Visit: Payer: Medicare PPO | Admitting: Family Medicine

## 2022-08-06 ENCOUNTER — Other Ambulatory Visit: Payer: Self-pay | Admitting: Pharmacist

## 2022-08-06 NOTE — Progress Notes (Signed)
Pharmacy Quality Measure Review  This patient is appearing on a report for being at risk of failing the adherence measure for hypertension (ACEi/ARB) medications this calendar year.   Medication: losartan 100 mg daily Last fill date: 6/4 for 30 day supply  Held at hospital discharge due to soft BP. BP controlled without losartan at most recent office visit. Sees HF clinic 7/12. Will follow  Catie Eppie Gibson, PharmD, BCACP, CPP Clinical Pharmacist [facility] [phone]

## 2022-08-07 ENCOUNTER — Other Ambulatory Visit: Payer: Self-pay | Admitting: Cardiology

## 2022-08-07 NOTE — Telephone Encounter (Signed)
Patient was given medication in the hospital. Refill for medication epic inbox. Please advise if ok to refill. Thank you

## 2022-08-08 ENCOUNTER — Ambulatory Visit: Payer: Medicare PPO

## 2022-08-08 DIAGNOSIS — Z7901 Long term (current) use of anticoagulants: Secondary | ICD-10-CM

## 2022-08-08 LAB — POCT INR: INR: 2.9 (ref 2.0–3.0)

## 2022-08-08 NOTE — Patient Instructions (Addendum)
Pre visit review using our clinic review tool, if applicable. No additional management support is needed unless otherwise documented below in the visit note.  Continue 4 mg daily except take 2 mg on Monday, Wednesday and Friday. Recheck in 2 week.

## 2022-08-08 NOTE — Progress Notes (Signed)
Pt started amiodarone on 6/27 and has had INR checked once since starting. INR at first check was 3.6 Continue 4 mg daily except take 2 mg on Monday, Wednesday and Friday. Recheck in 2 week.

## 2022-08-09 ENCOUNTER — Ambulatory Visit (HOSPITAL_BASED_OUTPATIENT_CLINIC_OR_DEPARTMENT_OTHER): Payer: Medicare PPO | Admitting: Cardiology

## 2022-08-09 ENCOUNTER — Other Ambulatory Visit
Admission: RE | Admit: 2022-08-09 | Discharge: 2022-08-09 | Disposition: A | Discharge status: Home / Self Care | Payer: Medicare PPO | Source: Ambulatory Visit | Attending: Cardiology | Admitting: Cardiology

## 2022-08-09 VITALS — BP 157/70 | HR 56 | Ht 71.0 in | Wt 226.0 lb

## 2022-08-09 DIAGNOSIS — I480 Paroxysmal atrial fibrillation: Secondary | ICD-10-CM | POA: Insufficient documentation

## 2022-08-09 DIAGNOSIS — Z79899 Other long term (current) drug therapy: Secondary | ICD-10-CM | POA: Insufficient documentation

## 2022-08-09 DIAGNOSIS — I5023 Acute on chronic systolic (congestive) heart failure: Secondary | ICD-10-CM | POA: Diagnosis not present

## 2022-08-09 DIAGNOSIS — I7121 Aneurysm of the ascending aorta, without rupture: Secondary | ICD-10-CM | POA: Diagnosis not present

## 2022-08-09 DIAGNOSIS — Z952 Presence of prosthetic heart valve: Secondary | ICD-10-CM | POA: Diagnosis not present

## 2022-08-09 DIAGNOSIS — G4733 Obstructive sleep apnea (adult) (pediatric): Secondary | ICD-10-CM | POA: Insufficient documentation

## 2022-08-09 DIAGNOSIS — Z7984 Long term (current) use of oral hypoglycemic drugs: Secondary | ICD-10-CM | POA: Insufficient documentation

## 2022-08-09 DIAGNOSIS — Z7901 Long term (current) use of anticoagulants: Secondary | ICD-10-CM | POA: Diagnosis not present

## 2022-08-09 DIAGNOSIS — I420 Dilated cardiomyopathy: Secondary | ICD-10-CM | POA: Insufficient documentation

## 2022-08-09 DIAGNOSIS — I484 Atypical atrial flutter: Secondary | ICD-10-CM | POA: Diagnosis not present

## 2022-08-09 DIAGNOSIS — I110 Hypertensive heart disease with heart failure: Secondary | ICD-10-CM | POA: Insufficient documentation

## 2022-08-09 DIAGNOSIS — I447 Left bundle-branch block, unspecified: Secondary | ICD-10-CM | POA: Diagnosis not present

## 2022-08-09 LAB — CBC
HCT: 46.7 % (ref 39.0–52.0)
Hemoglobin: 16.3 g/dL (ref 13.0–17.0)
MCH: 30.8 pg (ref 26.0–34.0)
MCHC: 34.9 g/dL (ref 30.0–36.0)
MCV: 88.3 fL (ref 80.0–100.0)
Platelets: 155 10*3/uL (ref 150–400)
RBC: 5.29 MIL/uL (ref 4.22–5.81)
RDW: 14 % (ref 11.5–15.5)
WBC: 5.4 10*3/uL (ref 4.0–10.5)
nRBC: 0 % (ref 0.0–0.2)

## 2022-08-09 LAB — COMPREHENSIVE METABOLIC PANEL
ALT: 23 U/L (ref 0–44)
AST: 32 U/L (ref 15–41)
Albumin: 4.2 g/dL (ref 3.5–5.0)
Alkaline Phosphatase: 75 U/L (ref 38–126)
Anion gap: 11 (ref 5–15)
BUN: 21 mg/dL (ref 8–23)
CO2: 20 mmol/L — ABNORMAL LOW (ref 22–32)
Calcium: 9.3 mg/dL (ref 8.9–10.3)
Chloride: 103 mmol/L (ref 98–111)
Creatinine, Ser: 1.13 mg/dL (ref 0.61–1.24)
GFR, Estimated: >60 mL/min (ref >60)
Glucose, Bld: 99 mg/dL (ref 70–99)
Potassium: 4.2 mmol/L (ref 3.5–5.1)
Sodium: 134 mmol/L — ABNORMAL LOW (ref 135–145)
Total Bilirubin: 1.2 mg/dL (ref 0.3–1.2)
Total Protein: 7.5 g/dL (ref 6.5–8.1)

## 2022-08-09 LAB — TSH: TSH: 2.833 u[IU]/mL (ref 0.350–4.500)

## 2022-08-09 LAB — BRAIN NATRIURETIC PEPTIDE: B Natriuretic Peptide: 56.3 pg/mL (ref 0.0–100.0)

## 2022-08-09 MED ORDER — SACUBITRIL-VALSARTAN 24-26 MG PO TABS
1.0000 | ORAL_TABLET | Freq: Two times a day (BID) | ORAL | 3 refills | Status: AC
Start: 2022-08-09 — End: ?

## 2022-08-09 MED ORDER — AMIODARONE HCL 200 MG PO TABS
200.0000 mg | ORAL_TABLET | Freq: Every day | ORAL | 3 refills | Status: DC
Start: 2022-08-09 — End: 2023-01-07

## 2022-08-09 NOTE — Patient Instructions (Addendum)
Medication Changes:  STOP taking Losartan, lasix, and potassium. START Entresto 24/26 MG two times daily. You have been given a 30 day free trial card today. DECREASE Amiodarone to 200 MG ONCE daily.   Lab Work:  Labs done today, your results will be available in MyChart, we will contact you for abnormal readings.   Testing/Procedures:  Your physician has requested that you have an echocardiogram. Echocardiography is a painless test that uses sound waves to create images of your heart. It provides your doctor with information about the size and shape of your heart and how well your heart's chambers and valves are working. This procedure takes approximately one hour. There are no restrictions for this procedure. ARRIVE at the [facility] at 9:45 AM on Friday, 09/06/22 for your 10:00 test. Please do NOT wear cologne, perfume, aftershave, or lotions (deodorant is allowed). Please arrive 15 minutes prior to your appointment time.   CTA Chest Your physician has requested that you have an CT of the chest/aorta. Non-Cardiac CT Angiography (CTA), is a special type of CT scan that uses a computer to produce multi-dimensional views of major blood vessels throughout the body. In CT angiography, a contrast material is injected through an IV to help visualize the blood vessels. [facility] will call you to schedule this test.   Referral:  You have been referred to cardiac electrophysiology at [facility] in [place]. Their office will call you to schedule an appointment  Special Instructions // Education:  Do the following things EVERYDAY: Weigh yourself in the morning before breakfast. Write it down and keep it in a log. Take your medicines as prescribed Eat low salt foods--Limit salt (sodium) to 2000 mg per day.  Stay as active as you can everyday Limit all fluids for the day to less than 2 liters   Follow-Up in: 1 month with Dr. Shirlee Latch     If you have  any questions or concerns before your next appointment please send [place] a message through [facility] or call our office at [phone] Monday-Friday 8 am-5 pm.   If you have an urgent need after hours on the weekend please call your Primary Cardiologist or the Advanced Heart Failure Clinic in [place] at [phone].

## 2022-08-11 NOTE — Progress Notes (Signed)
PCP: Joaquim Nam, MD HF Cardiology: Dr. Shirlee Latch  72 y.o. with history of bicuspid aortic valve s/p mechanical AVR, paroxysmal atrial fibrillation, and chronic systolic CHF presents for evaluation of CHF.  In 6/24, patient was admitted with atypical atrial flutter with RVR. Patient had St Jude mechanical AVR in 2011. Echo in 1/24 showed EF 45-50%. Patient noted palpitations for about 1 week prior to admission. He checked his HR and found it to be in the 130s. He went to see his PCP and was in atrial fibrillation vs flutter with RVR and was sent to ER. In the ER, he was in what appeared to be atypical flutter with RVR. Amiodarone gtt was started. CXR showed evidence for CHF. Mildly elevated HS-TnI with no trend. Echo during this admission showed EF 25-30%, moderate LV dilation, mild RV dysfunction, mechanical aortic valve functioning normally.  He was cardioverted back to NSR.    He remains in NSR today.  He denies exertional dyspnea or chest pain.  He is able to push mow the yard with no problems.  He has OSA but is unable to tolerate CPAP.  BP is elevated today but he has not yet taken his medications.  No lightheadedness or palpitations.   Labs (7/24): K 4.3, creatinine 1.25, hgb 14.9  ECG (personally reviewed): NSR, 1st degree AVB, LBBB 160 msec  PMH: 1. Bicuspid aortic valve: s/p St Jude mechanical AVR in 2011.  2. Hyperlipidemia 3. 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 OSA 5. HTN: ACEI cough 6. Type 2 diabetes 7. Atrial fibrillation: Paroxysmal.  8. Chronic LBBB 9. Chronic systolic CHF:  - Echo (1/23): EF 45-50% - Echo (6/24) with EF 25-30%, moderate LV dilation, mild RV dysfunction, mechanical aortic valve functioning normally.   Social History   Socioeconomic History   Marital status: Married    Spouse name: Not on file   Number of children: 2   Years of education: Not on file   Highest education level: Not on file  Occupational History   Occupation: mech.  supervisor    Employer: [employer]    Comment: [place]  Tobacco Use   Smoking status: Never   Smokeless tobacco: Never  Vaping Use   Vaping status: Never Used  Substance and Sexual Activity   Alcohol use: No    Alcohol/week: 0.0 standard drinks of alcohol   Drug use: No   Sexual activity: Not on file  Other Topics Concern   Not on file  Social History Narrative   Married 1996   Social Determinants of Health   Financial Resource Strain: Low Risk  (07/26/2022)   Overall Financial Resource Strain (CARDIA)    Difficulty of Paying Living Expenses: Not hard at all  Food Insecurity: No Food Insecurity (07/26/2022)   Hunger Vital Sign    Worried About Running Out of Food in the Last Year: Never true    Ran Out of Food in the Last Year: Never true  Transportation Needs: No Transportation Needs (07/26/2022)   PRAPARE - Administrator, Civil Service (Medical): No    Lack of Transportation (Non-Medical): No  Physical Activity: Inactive (12/16/2018)   Exercise Vital Sign    Days of Exercise per Week: 0 days    Minutes of Exercise per Session: 0 min  Stress: No Stress Concern Present (01/02/2022)   [employer] of Occupational Health - Occupational Stress Questionnaire    Feeling of Stress : Not at all  Social Connections: Unknown (01/02/2022)  Social Advertising account executive [NHANES]    Frequency of Communication with Friends and Family: Not on file    Frequency of Social Gatherings with Friends and Family: Not on file    Attends Religious Services: Not on file    Active Member of Clubs or Organizations: Not on file    Attends Banker Meetings: Not on file    Marital Status: Married  Intimate Partner Violence: Not At Risk (07/23/2022)   Humiliation, Afraid, Rape, and Kick questionnaire    Fear of Current or Ex-Partner: No    Emotionally Abused: No    Physically Abused: No    Sexually Abused: No   Family History  Problem Relation Age of  Onset   Obesity Mother        bedridden   Hypertension Mother    Heart disease Father        CABG, CAD   Hypertension Father    Lymphoma Father    Cancer Father        lymphoma   Alcohol abuse Brother    Cancer Brother        melanoma   Colon cancer Paternal Aunt    Stomach cancer Neg Hx    Rectal cancer Neg Hx    Prostate cancer Neg Hx    ROS: All systems reviewed and negative except as per HPI.   Current Outpatient Medications  Medication Sig Dispense Refill   ACCU-CHEK AVIVA PLUS test strip USE AS DIRECTED TO CHECK BLOOD SUGAR 2 TO 4 TIMES A DAY 100 each 11   Accu-Chek FastClix Lancets MISC USE AS DIRECTED TO CHECK BLOOD SUGAR TWICE DAILY 102 each 4   azelastine (ASTELIN) 0.1 % nasal spray Place 2 sprays into both nostrils 2 (two) times daily as needed for rhinitis. Use in each nostril as directed 30 mL 12   benzonatate (TESSALON) 200 MG capsule Take 1 capsule (200 mg total) by mouth 3 (three) times daily as needed. 30 capsule 2   carvedilol (COREG) 3.125 MG tablet Take 1 tablet (3.125 mg total) by mouth 2 (two) times daily with a meal. 60 tablet 1   cetirizine (ZYRTEC) 10 MG tablet Take 10 mg by mouth daily.     empagliflozin (JARDIANCE) 10 MG TABS tablet Take 1 tablet (10 mg total) by mouth daily before breakfast. 30 tablet 6   fluocinonide cream (LIDEX) 0.05 % APPLY 1 APPLICATION TO AFFECTEDE AREAS 2 TIMES DAILY AS NEEDED 30 g 1   glipiZIDE (GLUCOTROL) 5 MG tablet TAKE 2 TABLETS BY MOUTH DAILY BEFORE BREAKFAST IF AM SUGAR IS ABOVE 150 180 tablet 1   Lancets Misc. (ACCU-CHEK FASTCLIX LANCET) KIT AS DIRECTED 1 kit 0   metFORMIN (GLUCOPHAGE) 500 MG tablet Take 2 tablets (1,000 mg total) by mouth 2 (two) times daily. 360 tablet 3   omeprazole (PRILOSEC) 20 MG capsule TAKE ONE CAPSULE BY MOUTH ONCE DAILY 90 capsule 3   pravastatin (PRAVACHOL) 20 MG tablet TAKE ONE TABLET BY MOUTH ONCE A DAY 90 tablet 1   psyllium (METAMUCIL) 58.6 % packet Take 1 packet by mouth daily.      sacubitril-valsartan (ENTRESTO) 24-26 MG Take 1 tablet by mouth 2 (two) times daily. 60 tablet 3   spironolactone (ALDACTONE) 25 MG tablet Take 0.5 tablets (12.5 mg total) by mouth daily. 30 tablet 1   tamsulosin (FLOMAX) 0.4 MG CAPS capsule Take 1 capsule (0.4 mg total) by mouth daily. 90 capsule 3   WARFARIN SODIUM PO Take 2-4 mg  by mouth See admin instructions. Take 4mg  by mouth every Tuesday, Wednesday, Thursday, Saturday and Sunday night and take 2mg  by mouth every Monday and Friday night     amiodarone (PACERONE) 200 MG tablet Take 1 tablet (200 mg total) by mouth daily. 30 tablet 3   No current facility-administered medications for this visit.   BP (!) 157/70   Pulse (!) 56   Ht 5\' 11"  (1.803 m)   Wt 226 lb (102.5 kg)   SpO2 98%   BMI 31.52 kg/m  General: NAD Neck: No JVD, no thyromegaly or thyroid nodule.  Lungs: Clear to auscultation bilaterally with normal respiratory effort. CV: Nondisplaced PMI.  Heart regular S1/S2 with mechanical S2, no S3/S4, 1/6 SEM RUSB.  No peripheral edema.  No carotid bruit.  Normal pedal pulses.  Abdomen: Soft, nontender, no hepatosplenomegaly, no distention.  Skin: Intact without lesions or rashes.  Neurologic: Alert and oriented x 3.  Psych: Normal affect. Extremities: No clubbing or cyanosis.  HEENT: Normal.   Assessment/plan: 1. Atrial arrhythmias: Atypical atrial flutter at 6/24 admission, had had atrial fibrillation peri-aortic valve surgery. 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  He is now in NSR on amiodarone.  - Can decrease amiodarone to 200 mg daily.  Check LFTs and TSH, he will need a regular eye exam.  - Continue warfarin with mechanical AoV.  - Refer to EP for consideration of atrial flutter/fibrillation ablation. This would likely allow him to safely stop amiodarone.  2. Acute on chronic systolic CHF: Echo in 1/24 with EF 45-50%.  Has baseline LBBB.  Echo in 6/24 in setting of  AFL with RVR with EF 25-30%, moderate LV dilation with global HK, moderate LVH, mildly decreased RV systolic function, normally functioning mechanical AoV. It is possible that drop in EF was tachycardia-mediated with atypical atrial flutter/RVR of uncertain duration.  He is now in NSR.  He is not volume overloaded on exam, NYHA class I-II.  - Start Entresto 24/26 bid. BMET/BNP today, BMET in 10 days.  - He can stop Lasix and KCl.  - Continue Coreg 3.125 mg bid.  - Continue Jardiance 10 mg daily.  - Continue spironolactone 12.5 daily.  - I will arrange for repeat echo.  If EF improves back to baseline, likely tachycardia-mediated.  If not, would favor cath (would have to bridge with Lovenox off warfarin given mechanical AoV).  3. St Jude mechanical aortic valve: h/o bicuspid aortic valve with AS.  Valve functioned normally on 6/24 echo.   - Continue warfarin.  4. OSA: has not tolerated CPAP. Would followup with sleep medicine for options.  5. Ascending aortic aneurysm: Has history of dilated ascending aorta in setting of bicuspid aortic valve.   - I will arrange for CTA chest to evaluate ascending aorta.   Followup in 1 month.   Marca Ancona 08/11/2022

## 2022-08-12 ENCOUNTER — Telehealth: Payer: Self-pay

## 2022-08-12 NOTE — Telephone Encounter (Signed)
Pt was prescribed amiodarone on discharge, 6/27, at 200 mg BID for admission diagnosis of Afib with RVR. Pt has hx of bicuspid aortic valve replacement in 2011.  Pt contacted coumadin clinic today to report amiodarone dose has been decreased to 200 mg every day. Advised pt to keep coumadin clinic apt for next week and if any other changes to contact office. Pt denies any other changes. Other medication changes listed below in cardiology note from 7/12. Due to new afib diagnosis and hx of valve replacement, recommend INR range change to 2.5-3.5, or if PCP concern of bleeding risk then change to 2.5-3.0. Forwarding note to PCP for advisement on INR range.   Cardiology note from 7/12; STOP taking Losartan, lasix, and potassium. START Entresto 24/26 MG two times daily. You have been given a 30 day free trial card today. DECREASE Amiodarone to 200 MG ONCE daily.

## 2022-08-13 NOTE — Telephone Encounter (Signed)
It appears that 2.5-3.5 is a reasonable range.  Thanks.

## 2022-08-15 ENCOUNTER — Ambulatory Visit: Payer: Medicare PPO

## 2022-08-15 ENCOUNTER — Encounter: Payer: Self-pay | Admitting: Pharmacist

## 2022-08-15 NOTE — Progress Notes (Signed)
Pharmacy Quality Measure Review  This patient is appearing on a report for being at risk of failing the adherence measure for hypertension (ACEi/ARB) medications this calendar year.   Patient has now been started on Entresto, will be excluded from the measure.   Catie Eppie Gibson, PharmD, BCACP, CPP Clinical Pharmacist [facility] [phone]

## 2022-08-19 LAB — FECAL OCCULT BLOOD, GUAIAC: Fecal Occult Blood: NEGATIVE

## 2022-08-22 ENCOUNTER — Ambulatory Visit: Payer: Medicare PPO

## 2022-08-22 DIAGNOSIS — Z7901 Long term (current) use of anticoagulants: Secondary | ICD-10-CM

## 2022-08-22 LAB — POCT INR: INR: 2.5 (ref 2.0–3.0)

## 2022-08-22 NOTE — Progress Notes (Signed)
Agree. Thanks

## 2022-08-22 NOTE — Progress Notes (Signed)
Pt started on amiodarone on 6/27. Recommend with new afib diagnosis and hx of valve replacement to change to INR range to 2.5-3.5 (current range is 2-3). Per PCP ok to change INR range to 2.5-3.5. INR range changed with new diagnosis addition of afib with hx of valve replacement.   Continue 4 mg daily except take 2 mg on Monday, Wednesday and Friday. Recheck in 2 week.

## 2022-08-22 NOTE — Patient Instructions (Addendum)
Pre visit review using our clinic review tool, if applicable. No additional management support is needed unless otherwise documented below in the visit note.  Continue 4 mg daily except take 2 mg on Monday, Wednesday and Friday. Recheck in 2 week.

## 2022-08-22 NOTE — Telephone Encounter (Signed)
Noted. Pt was in today for INR check and range was changed. INR today was 2.5

## 2022-09-04 ENCOUNTER — Ambulatory Visit: Payer: Medicare PPO | Admitting: Cardiology

## 2022-09-05 ENCOUNTER — Telehealth (HOSPITAL_COMMUNITY): Payer: Self-pay | Admitting: *Deleted

## 2022-09-05 ENCOUNTER — Ambulatory Visit: Payer: Medicare PPO

## 2022-09-05 DIAGNOSIS — Z7901 Long term (current) use of anticoagulants: Secondary | ICD-10-CM | POA: Diagnosis not present

## 2022-09-05 LAB — POCT INR: INR: 4.7 — AB (ref 2.0–3.0)

## 2022-09-05 MED ORDER — WARFARIN SODIUM 3 MG PO TABS
ORAL_TABLET | ORAL | 1 refills | Status: DC
Start: 2022-09-05 — End: 2023-02-26

## 2022-09-05 NOTE — Progress Notes (Signed)
Pt started on amiodarone on 6/27. Recommend with new afib diagnosis and hx of valve replacement to change to INR range to 2.5-3.5 (current range is 2-3). Per PCP ok to change INR range to 2.5-3.5. INR range changed with new diagnosis addition of afib with hx of valve replacement.   Pt reports starting a new vitamin supplement. He is unsure what is in it so he will check for vitamin K and vitamin E and let the coumadin clinic know later today. Hold dose today and reduce dose tomorrow to take 1 mg and then change weekly dose to take 3 mg daily except take 1.5 mg on Wednesday. Recheck in 2 week.  Pt denies any abnormal bruising or bleeding. Advised if any s/s to go to ER. Pt verbalized understanding.

## 2022-09-05 NOTE — Patient Instructions (Addendum)
Pre visit review using our clinic review tool, if applicable. No additional management support is needed unless otherwise documented below in the visit note.  Hold dose today and reduce dose tomorrow to take 1 mg and then change weekly dose to take 3 mg daily except take 1.5 mg on Wednesday. Recheck in 2 week.

## 2022-09-05 NOTE — Progress Notes (Signed)
Pt called to report the supplement he is taking reports 100% of daily vitamin E but does not mention vitamin K.  He also reported it had fish oil in it. Advised this and the vitamin E is most likely what is causing supratherapeutic INR. He will stop taking it and we will recheck INR in 2 weeks. Advised if any changes to contact the coumadin clinic. Pt verbalized understanding.

## 2022-09-06 ENCOUNTER — Ambulatory Visit
Admission: RE | Admit: 2022-09-06 | Discharge: 2022-09-06 | Disposition: A | Discharge status: Home / Self Care | Payer: Medicare PPO | Source: Ambulatory Visit | Attending: Cardiology | Admitting: Cardiology

## 2022-09-06 DIAGNOSIS — I340 Nonrheumatic mitral (valve) insufficiency: Secondary | ICD-10-CM | POA: Diagnosis not present

## 2022-09-06 DIAGNOSIS — I5023 Acute on chronic systolic (congestive) heart failure: Secondary | ICD-10-CM

## 2022-09-06 DIAGNOSIS — Z952 Presence of prosthetic heart valve: Secondary | ICD-10-CM | POA: Diagnosis not present

## 2022-09-06 DIAGNOSIS — E119 Type 2 diabetes mellitus without complications: Secondary | ICD-10-CM | POA: Diagnosis not present

## 2022-09-06 DIAGNOSIS — I110 Hypertensive heart disease with heart failure: Secondary | ICD-10-CM | POA: Diagnosis not present

## 2022-09-06 DIAGNOSIS — I509 Heart failure, unspecified: Secondary | ICD-10-CM | POA: Insufficient documentation

## 2022-09-06 DIAGNOSIS — I420 Dilated cardiomyopathy: Secondary | ICD-10-CM

## 2022-09-06 LAB — ECHOCARDIOGRAM COMPLETE
AR max vel: 2.81 cm2
AV Area VTI: 2.61 cm2
AV Area mean vel: 2.66 cm2
AV Mean grad: 7.5 mmHg
AV Peak grad: 14.7 mmHg
Ao pk vel: 1.92 m/s
Area-P 1/2: 3.65 cm2
MV VTI: 3.16 cm2
S' Lateral: 4 cm

## 2022-09-06 MED ORDER — PERFLUTREN LIPID MICROSPHERE
1.0000 mL | INTRAVENOUS | Status: AC | PRN
  Administered 2022-09-06: 5 mL via INTRAVENOUS

## 2022-09-06 NOTE — Progress Notes (Signed)
*  PRELIMINARY RESULTS* Echocardiogram 2D Echocardiogram has been performed.  Carolyne Fiscal 09/06/2022, 11:15 AM

## 2022-09-09 ENCOUNTER — Ambulatory Visit (HOSPITAL_COMMUNITY)
Admission: RE | Admit: 2022-09-09 | Discharge: 2022-09-09 | Disposition: A | Discharge status: Home / Self Care | Payer: Medicare PPO | Source: Ambulatory Visit | Attending: Cardiology | Admitting: Cardiology

## 2022-09-09 DIAGNOSIS — I420 Dilated cardiomyopathy: Secondary | ICD-10-CM | POA: Insufficient documentation

## 2022-09-09 DIAGNOSIS — J9811 Atelectasis: Secondary | ICD-10-CM | POA: Diagnosis not present

## 2022-09-09 DIAGNOSIS — I719 Aortic aneurysm of unspecified site, without rupture: Secondary | ICD-10-CM | POA: Diagnosis not present

## 2022-09-09 MED ORDER — IOHEXOL 350 MG/ML SOLN
75.0000 mL | Freq: Once | INTRAVENOUS | Status: AC | PRN
  Administered 2022-09-09: 75 mL via INTRAVENOUS

## 2022-09-10 ENCOUNTER — Telehealth (HOSPITAL_COMMUNITY): Payer: Self-pay

## 2022-09-10 NOTE — Telephone Encounter (Signed)
Spoke with patient regarding the following results. Patient made aware and patient verbalized understanding.

## 2022-09-10 NOTE — Telephone Encounter (Signed)
-----   Message from Marca Ancona sent at 09/10/2022 12:11 AM EDT ----- Stable ascending aorta repair.

## 2022-09-11 NOTE — Progress Notes (Unsigned)
Electrophysiology Office Note:    Date:  09/12/2022   ID:  [name] [name], DOB 25-Nov-1950, MRN [id number]  [facility] Cardiologist:  Rollene Rotunda, MD  [facility] Electrophysiologist:  Lanier Prude, MD   Referring MD: Laurey Morale, MD   Chief Complaint: AF  History of Present Illness:    [name] [name] is a 72 y.o. malewho I am seeing today for an evaluation of AFat the request of Dr Shirlee Latch.  The patient was last seen by Dr Shirlee Latch on 08/09/2022.  The patient has a medical history that includes bicuspid AV s/p mechanical AVR, pAF/AFL, chronic systolic heart failure. His AVR was in 2011.   In June of this year he was admitted with a newly reduced ejection fraction of 25 to 30%.  He was in an atypical atrial flutter.  Amiodarone was started.  He is doing well today.  He can tell that he is in normal rhythm.  He feels intense palpitations when his heart is out of rhythm.      Their past medical, social and family history was reveiwed.   ROS:   Please see the history of present illness.    All other systems reviewed and are negative.  EKGs/Labs/Other Studies Reviewed:    The following studies were reviewed today:  September 06, 2022 echo EF 30 to 35% Moderate to severely reduced LV function Global hypokinesis RV function low normal Mild MR  July 23, 2022 echo EF 25 to 30% Global hypokinesis RV function mildly reduced  February 16, 2021 echo EF 45 to 50% RV function normal Moderately dilated left atrium  July 22, 2022 EKG shows atypical appearing atrial flutter with left bundle branch block   July 24, 2022 EKG shows sinus rhythm, short PR, left on March block   EKG Interpretation Date/Time:  Thursday September 12 2022 10:12:07 EDT Ventricular Rate:  61 PR Interval:  230 QRS Duration:  146 QT Interval:  456 QTC Calculation: 459 R Axis:   -47  Text Interpretation: Sinus rhythm with 1st degree A-V block Left axis deviation Left bundle  branch block Confirmed by Steffanie Dunn [phone]) on 09/12/2022 10:13:10 AM    Physical Exam:    VS:  BP 124/80 (BP Location: Left Arm, Patient Position: Sitting, Cuff Size: Large)   Pulse 61   Ht 5\' 11"  (1.803 m)   Wt 226 lb 9.6 oz (102.8 kg)   SpO2 97%   BMI 31.60 kg/m     Wt Readings from Last 3 Encounters:  09/12/22 226 lb 9.6 oz (102.8 kg)  08/09/22 226 lb (102.5 kg)  07/30/22 232 lb (105.2 kg)     GEN:  Well nourished, well developed in no acute distress CARDIAC: RRR, no murmurs, rubs, gallops.  Crisp mechanical click of the aortic valve. RESPIRATORY:  Clear to auscultation without rales, wheezing or rhonchi       ASSESSMENT AND PLAN:    1. Persistent atrial fibrillation (HCC)   2. Atrial flutter, unspecified type (HCC)   3. Encounter for long-term (current) use of high-risk medication   4. HFrEF (heart failure with reduced ejection fraction) (HCC)     #Persistent atrial fibrillation and flutter #High risk med monitoring-amiodarone Maintaining sinus rhythm on amiodarone.  His ejection fraction worsened in the setting of atrial fibrillation/flutter.  Rhythm control indicated. Continue Coumadin (mechanical aortic valve)  I discussed treatment options for his atrial arrhythmias including continuing with antiarrhythmic drugs versus pursuing catheter ablation.  Lesion set would include pulmonary veins,  posterior wall, CTI +/- any atypical flutter circuits.  Discussed treatment options today for AF including antiarrhythmic drug therapy and ablation. Discussed risks, recovery and likelihood of success with each treatment strategy. Risk, benefits, and alternatives to EP study and ablation for afib were discussed. These risks include but are not limited to stroke, bleeding, vascular damage, tamponade, perforation, damage to the esophagus, lungs, phrenic nerve and other structures, pulmonary vein stenosis, worsening renal function, coronary vasospasm and death.  Discussed  potential need for repeat ablation procedures and antiarrhythmic drugs after an initial ablation. The patient understands these risk and wishes to proceed.  We will therefore proceed with catheter ablation at the next available time.  Carto, ICE, anesthesia are requested for the procedure.  Will also obtain CT PV protocol prior to the procedure to exclude LAA thrombus and further evaluate atrial anatomy.   #Chronic systolic heart failure #Left bundle branch block NYHA class II-III.  EF 25 to 30% on most recent echocardiogram.  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 Continue GDMT, Entresto, Jardiance, spironolactone If no improvement with maintenance of NSR, consider CRT-D.   Signed, Rossie Muskrat. Lalla Brothers, MD, [facility], [facility] 09/12/2022 10:35 AM    Electrophysiology [facility]

## 2022-09-12 ENCOUNTER — Ambulatory Visit: Payer: Medicare PPO | Attending: Cardiology | Admitting: Cardiology

## 2022-09-12 ENCOUNTER — Telehealth: Payer: Self-pay

## 2022-09-12 ENCOUNTER — Encounter: Payer: Self-pay | Admitting: Cardiology

## 2022-09-12 VITALS — BP 124/80 | HR 61 | Ht 71.0 in | Wt 226.6 lb

## 2022-09-12 DIAGNOSIS — I4819 Other persistent atrial fibrillation: Secondary | ICD-10-CM

## 2022-09-12 DIAGNOSIS — I5020 Unspecified systolic (congestive) heart failure: Secondary | ICD-10-CM

## 2022-09-12 DIAGNOSIS — I4892 Unspecified atrial flutter: Secondary | ICD-10-CM | POA: Diagnosis not present

## 2022-09-12 DIAGNOSIS — Z79899 Other long term (current) drug therapy: Secondary | ICD-10-CM

## 2022-09-12 NOTE — Patient Instructions (Signed)
Medication Instructions:  Your physician recommends that you continue on your current medications as directed. Please refer to the Current Medication list given to you today.  *If you need a refill on your cardiac medications before your next appointment, please call your pharmacy*  Lab Work: BMET and CBC prior to CT scan and Ablation  Testing/Procedures: Your physician has requested that you have cardiac CT. Cardiac computed tomography (CT) is a painless test that uses an x-ray machine to take clear, detailed pictures of your heart. For further information please visit [email or web address] We will call you to schedule your CT scan. It will be done about one week prior to your ablation.  Your physician has recommended that you have an ablation. Catheter ablation is a medical procedure used to treat some cardiac arrhythmias (irregular heartbeats). During catheter ablation, a long, thin, flexible tube is put into a blood vessel in your groin (upper thigh), or neck. This tube is called an ablation catheter. It is then guided to your heart through the blood vessel. Radio frequency waves destroy small areas of heart tissue where abnormal heartbeats may cause an arrhythmia to start. You will be scheduled for an ablation with Dr. Lalla Brothers in January 2025. We will call you with available dates once his schedule opens up.   Follow-Up: At [facility], you and your health needs are our priority.  As part of our continuing mission to provide you with exceptional heart care, we have created designated Provider Care Teams.  These Care Teams include your primary Cardiologist (physician) and Advanced Practice Providers (APPs -  Physician Assistants and Nurse Practitioners) who all work together to provide you with the care you need, when you need it.  Your next appointment:   We will call you to schedule your follow up appointments.

## 2022-09-12 NOTE — Telephone Encounter (Signed)
Pt LVM requesting to RS his coumadin clinic apt on 8/22 to a later time that day due to conflict with another apt.   LVM for pt to return call.

## 2022-09-13 NOTE — Telephone Encounter (Signed)
LVM

## 2022-09-16 NOTE — Telephone Encounter (Signed)
LVM

## 2022-09-19 ENCOUNTER — Encounter: Payer: Medicare PPO | Admitting: Cardiology

## 2022-09-19 ENCOUNTER — Other Ambulatory Visit: Payer: Self-pay | Admitting: Cardiology

## 2022-09-19 ENCOUNTER — Ambulatory Visit: Payer: Medicare PPO

## 2022-09-19 DIAGNOSIS — Z7901 Long term (current) use of anticoagulants: Secondary | ICD-10-CM

## 2022-09-19 LAB — POCT INR: INR: 2.7 (ref 2.0–3.0)

## 2022-09-19 NOTE — Progress Notes (Signed)
Pt started on amiodarone on 6/27. Pt has stopped the vitamin with fish oil.  Continue 3 mg daily except take 1.5 mg on Wednesday. Recheck in 4 week.

## 2022-09-19 NOTE — Patient Instructions (Addendum)
Pre visit review using our clinic review tool, if applicable. No additional management support is needed unless otherwise documented below in the visit note.  Continue 3 mg daily except take 1.5 mg on Wednesday. Recheck in 4 week.

## 2022-09-20 ENCOUNTER — Ambulatory Visit: Payer: Medicare PPO | Admitting: Cardiology

## 2022-09-20 ENCOUNTER — Encounter: Payer: Self-pay | Admitting: Cardiology

## 2022-09-20 ENCOUNTER — Telehealth: Payer: Self-pay | Admitting: Pharmacist

## 2022-09-20 ENCOUNTER — Telehealth: Payer: Self-pay | Admitting: *Deleted

## 2022-09-20 ENCOUNTER — Other Ambulatory Visit
Admission: RE | Admit: 2022-09-20 | Discharge: 2022-09-20 | Disposition: A | Discharge status: Home / Self Care | Payer: Medicare PPO | Source: Ambulatory Visit | Attending: Cardiology | Admitting: Cardiology

## 2022-09-20 ENCOUNTER — Encounter: Payer: Self-pay | Admitting: Pharmacist

## 2022-09-20 VITALS — BP 139/64 | HR 60 | Wt 230.0 lb

## 2022-09-20 DIAGNOSIS — I420 Dilated cardiomyopathy: Secondary | ICD-10-CM | POA: Diagnosis not present

## 2022-09-20 DIAGNOSIS — I5020 Unspecified systolic (congestive) heart failure: Secondary | ICD-10-CM | POA: Insufficient documentation

## 2022-09-20 LAB — COMPREHENSIVE METABOLIC PANEL
ALT: 26 U/L (ref 0–44)
AST: 30 U/L (ref 15–41)
Albumin: 4 g/dL (ref 3.5–5.0)
Alkaline Phosphatase: 68 U/L (ref 38–126)
Anion gap: 11 (ref 5–15)
BUN: 13 mg/dL (ref 8–23)
CO2: 22 mmol/L (ref 22–32)
Calcium: 9.4 mg/dL (ref 8.9–10.3)
Chloride: 106 mmol/L (ref 98–111)
Creatinine, Ser: 1.04 mg/dL (ref 0.61–1.24)
GFR, Estimated: >60 mL/min (ref >60)
Glucose, Bld: 122 mg/dL — ABNORMAL HIGH (ref 70–99)
Potassium: 4.3 mmol/L (ref 3.5–5.1)
Sodium: 139 mmol/L (ref 135–145)
Total Bilirubin: 0.8 mg/dL (ref 0.3–1.2)
Total Protein: 7.3 g/dL (ref 6.5–8.1)

## 2022-09-20 LAB — CBC
HCT: 47.7 % (ref 39.0–52.0)
Hemoglobin: 15.9 g/dL (ref 13.0–17.0)
MCH: 31.1 pg (ref 26.0–34.0)
MCHC: 33.3 g/dL (ref 30.0–36.0)
MCV: 93.3 fL (ref 80.0–100.0)
Platelets: 146 10*3/uL — ABNORMAL LOW (ref 150–400)
RBC: 5.11 MIL/uL (ref 4.22–5.81)
RDW: 14.8 % (ref 11.5–15.5)
WBC: 5.9 10*3/uL (ref 4.0–10.5)
nRBC: 0 % (ref 0.0–0.2)

## 2022-09-20 LAB — BRAIN NATRIURETIC PEPTIDE: B Natriuretic Peptide: 56.7 pg/mL (ref 0.0–100.0)

## 2022-09-20 MED ORDER — ENOXAPARIN SODIUM 100 MG/ML IJ SOSY: 100.0000 mg | PREFILLED_SYRINGE | Freq: Two times a day (BID) | INTRAMUSCULAR | 0 refills | Status: DC

## 2022-09-20 MED ORDER — SPIRONOLACTONE 25 MG PO TABS: 25.0000 mg | ORAL_TABLET | Freq: Every day | ORAL | 3 refills | Status: DC

## 2022-09-20 NOTE — Telephone Encounter (Unsigned)
Requested to bridge warfarin for mechanical AVR with enoxaparin. Instructions given as follows:  8/23: Last dose of warfarin.  8/24: No warfarin or enoxaparin (Lovenox).  8/25: Inject enoxaparin 100 mg in the fatty abdominal tissue at least 2 inches from the belly button twice a day about 12 hours apart, 8am and 8pm rotate sites. No warfarin.  8/26: Inject enoxaparin in the fatty tissue every 12 hours, 8am and 8pm. No warfarin.  8/27: Inject enoxaparin in the fatty tissue every 12 hours, 8am and 8pm. No warfarin.  8/28: Inject enoxaparin in the fatty tissue in the morning at 8 am (No PM dose). No warfarin.  8/29: Procedure Day - No enoxaparin - Resume warfarin in the evening or as directed by doctor (take an extra half tablet with usual dose for 2 days then resume normal dose).  8/30: Resume enoxaparin inject in the fatty tissue every 12 hours and take warfarin  8/31: Inject enoxaparin in the fatty tissue every 12 hours and take warfarin  9/1: Inject enoxaparin in the fatty tissue every 12 hours and take warfarin  9/2: Inject enoxaparin in the fatty tissue every 12 hours and take warfarin  9/3: Inject enoxaparin in the fatty tissue every 12 hours and take warfarin : warfarin appt to check INR.   Spoke with Sherrie George who manages the patient's INR outpatient. She will call the patient to arrange follow up at the Coumadin clinic after the procedure.   Thank you for involving pharmacy in this patient's care.  Enos Fling, PharmD, BCPS Phone - [phone] Clinical Pharmacist 09/20/2022 10:48 AM

## 2022-09-20 NOTE — Telephone Encounter (Signed)
   8/23: Last dose of warfarin.  8/24: No warfarin or enoxaparin (Lovenox).  8/25: Inject enoxaparin 100 mg in the fatty abdominal tissue at least 2 inches from the belly button twice a day about 12 hours apart, 8am and 8pm rotate sites. No warfarin.  8/26: Inject enoxaparin in the fatty tissue every 12 hours, 8am and 8pm. No warfarin.  8/27: Inject enoxaparin in the fatty tissue every 12 hours, 8am and 8pm. No warfarin.  8/28: Inject enoxaparin in the fatty tissue in the morning at 8 am (No PM dose). No warfarin.  8/29: Procedure Day - No enoxaparin - Resume warfarin in the evening or as directed by doctor (take an extra half tablet with usual dose for 2 days then resume normal dose).  8/30: Resume enoxaparin inject in the fatty tissue every 12 hours and take warfarin  8/31: Inject enoxaparin in the fatty tissue every 12 hours and take warfarin  9/1: Inject enoxaparin in the fatty tissue every 12 hours and take warfarin  9/2: Inject enoxaparin in the fatty tissue every 12 hours and take warfarin  9/3: Inject enoxaparin in the fatty tissue every 12 hours and take warfarin : warfarin appt to check INR.

## 2022-09-20 NOTE — Telephone Encounter (Signed)
Contacted by [facility] pharmacist, Enos Fling, to discuss lovenox bridge pt will be placed on for heart cath on 8/29. He has instructed pt of dosing and would like INR check on 9/3. Advised there will not be a coumadin nurse in the clinic the week of 9/2. Advised he could have INR checked on 9/3 at the lab at [facility] and his PCP could manage dosing while coumadin clinic nurse is out of the office.  Advised this nurse will f/u with pt to get the lab apt scheduled for 9/3.

## 2022-09-20 NOTE — Patient Instructions (Signed)
Medication Changes:  Increase Spironolactone to 25 mg (1 tablet) daily.   Lab Work:  Labs done today, your results will be available in MyChart, we will contact you for abnormal readings.   Testing/Procedures:  Your physician has requested that you have a cardiac catheterization. Cardiac catheterization is used to diagnose and/or treat various heart conditions. Doctors may recommend this procedure for a number of different reasons. The most common reason is to evaluate chest pain. Chest pain can be a symptom of coronary artery disease (CAD), and cardiac catheterization can show whether plaque is narrowing or blocking your heart's arteries. This procedure is also used to evaluate the valves, as well as measure the blood flow and oxygen levels in different parts of your heart. For further information please visit [email or web address] Please follow instruction sheet, as given.    You are scheduled for a Cardiac Catheterization on Thursday, August 29 with Dr. Marca Ancona.  1. Please arrive at the [facility] (Main Entrance A) at [facility]: [address] [place], [place] [address] at 5:30 AM (This time is 2 hour(s) before your procedure to ensure your preparation). Free valet parking service is available. You will check in at ADMITTING. The support person will be asked to wait in the waiting room.  It is OK to have someone drop you off and come back when you are ready to be discharged.    Special note: Every effort is made to have your procedure done on time. Please understand that emergencies sometimes delay scheduled procedures.  2. Diet: Do not eat solid foods after midnight.  The patient may have clear liquids until 5am upon the day of the procedure.  PLEASE FOLLOW THE LOVENOX BRIDGE MEDICATION INSTRUCTIONS GIVEN TO YOU BY [facility] WISE PHARMACIST.   4. Medication instructions in preparation for your procedure:   Contrast Allergy: No    Do not take Diabetes Med Glucophage  (Metformin) on the day of the procedure and HOLD 48 HOURS AFTER THE PROCEDURE. And your Glipzide. Please HOLD Jardiance 3 days before your Heart Catheterization.     5. Plan to go home the same day, you will only stay overnight if medically necessary. 6. Bring a current list of your medications and current insurance cards. 7. You MUST have a responsible person to drive you home. 8. Someone MUST be with you the first 24 hours after you arrive home or your discharge will be delayed. 9. Please wear clothes that are easy to get on and off and wear slip-on shoes.  Thank you for allowing [place] to care for you!   -- [facility]   Special Instructions // Education:  Do the following things EVERYDAY: Weigh yourself in the morning before breakfast. Write it down and keep it in a log. Take your medicines as prescribed Eat low salt foods--Limit salt (sodium) to 2000 mg per day.  Stay as active as you can everyday Limit all fluids for the day to less than 2 liters   Follow-Up in: with Dr. Shirlee Latch after your Cath.    If you have any questions or concerns before your next appointment please send [place] a message through [place] or call our office at [phone] Monday-Friday 8 am-5 pm.   If you have an urgent need after hours on the weekend please call your Primary Cardiologist or the [facility] in [place] at [phone].

## 2022-09-22 NOTE — Progress Notes (Signed)
PCP: Joaquim Nam, MD HF Cardiology: Dr. Shirlee Latch  72 y.o. with history of bicuspid aortic valve s/p mechanical AVR, paroxysmal atrial fibrillation, and chronic systolic CHF presents for evaluation of CHF.  In 6/24, patient was admitted with atypical atrial flutter with RVR. Patient had St Jude mechanical AVR in 2011. Echo in 1/24 showed EF 45-50%. Patient noted palpitations for about 1 week prior to admission. He checked his HR and found it to be in the 130s. He went to see his PCP and was in atrial fibrillation vs flutter with RVR and was sent to ER. In the ER, he was in what appeared to be atypical flutter with RVR. Amiodarone gtt was started. CXR showed evidence for CHF. Mildly elevated HS-TnI with no trend. Echo during this admission showed EF 25-30%, moderate LV dilation, mild RV dysfunction, mechanical aortic valve functioning normally.  He was cardioverted back to NSR.    Repeat echo in 8/24 in NSR showed EF 30-35%, global hypokinesis, mild LVH, normal RV, mild MR, mechanical aortic valve functioning normally.   He remains in NSR today.  No dyspnea except with heavy exertion.  No chest pain.  No orthopnea/PND.  Able to climb a flight of stairs. No BRBPR/melena. Weight up 4 lbs.  No lightheadedness.  He has seen EP with plan for atrial fibrillation ablation in the future.   Labs (7/24): K 4.3, creatinine 1.25, hgb 14.9, LFTs normal, TSH normal  ECG (personally reviewed, 8/24): NSR, 1st degree AVB, LBBB 146 msec  PMH: 1. Bicuspid aortic valve: s/p St Jude mechanical AVR in 2011.  - CTA chest in 8/24 showed stable repaired aorta 2. Hyperlipidemia 3. 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 OSA 5. HTN: ACEI cough 6. Type 2 diabetes 7. Atrial fibrillation: Paroxysmal.  8. Chronic LBBB 9. Chronic systolic CHF:  - Echo (1/23): EF 45-50% - Echo (6/24) with EF 25-30%, moderate LV dilation, mild RV dysfunction, mechanical aortic valve functioning normally.  - Echo (8/24): EF  30-35%, global hypokinesis, mild LVH, normal RV, mild MR, mechanical aortic valve functioning normally.   Social History   Socioeconomic History   Marital status: Married    Spouse name: Not on file   Number of children: 2   Years of education: Not on file   Highest education level: Not on file  Occupational History   Occupation: mech. supervisor    Employer: [employer]    Comment: [place]  Tobacco Use   Smoking status: Never   Smokeless tobacco: Never  Vaping Use   Vaping status: Never Used  Substance and Sexual Activity   Alcohol use: No    Alcohol/week: 0.0 standard drinks of alcohol   Drug use: No   Sexual activity: Not on file  Other Topics Concern   Not on file  Social History Narrative   Married 1996   Social Determinants of Health   Financial Resource Strain: Low Risk  (07/26/2022)   Overall Financial Resource Strain (CARDIA)    Difficulty of Paying Living Expenses: Not hard at all  Food Insecurity: No Food Insecurity (07/26/2022)   Hunger Vital Sign    Worried About Running Out of Food in the Last Year: Never true    Ran Out of Food in the Last Year: Never true  Transportation Needs: No Transportation Needs (07/26/2022)   PRAPARE - Administrator, Civil Service (Medical): No    Lack of Transportation (Non-Medical): No  Physical Activity: Inactive (12/16/2018)   Exercise Vital Sign  Days of Exercise per Week: 0 days    Minutes of Exercise per Session: 0 min  Stress: No Stress Concern Present (01/02/2022)   [employer] of Occupational Health - Occupational Stress Questionnaire    Feeling of Stress : Not at all  Social Connections: Unknown (01/02/2022)   Social Connection and Isolation Panel [NHANES]    Frequency of Communication with Friends and Family: Not on file    Frequency of Social Gatherings with Friends and Family: Not on file    Attends Religious Services: Not on file    Active Member of Clubs or Organizations: Not on file     Attends Banker Meetings: Not on file    Marital Status: Married  Intimate Partner Violence: Not At Risk (07/23/2022)   Humiliation, Afraid, Rape, and Kick questionnaire    Fear of Current or Ex-Partner: No    Emotionally Abused: No    Physically Abused: No    Sexually Abused: No   Family History  Problem Relation Age of Onset   Obesity Mother        bedridden   Hypertension Mother    Heart disease Father        CABG, CAD   Hypertension Father    Lymphoma Father    Cancer Father        lymphoma   Alcohol abuse Brother    Cancer Brother        melanoma   Colon cancer Paternal Aunt    Stomach cancer Neg Hx    Rectal cancer Neg Hx    Prostate cancer Neg Hx    ROS: All systems reviewed and negative except as per HPI.   Current Outpatient Medications  Medication Sig Dispense Refill   amiodarone (PACERONE) 200 MG tablet Take 1 tablet (200 mg total) by mouth daily. 30 tablet 3   azelastine (ASTELIN) 0.1 % nasal spray Place 2 sprays into both nostrils 2 (two) times daily as needed for rhinitis. Use in each nostril as directed 30 mL 12   benzonatate (TESSALON) 200 MG capsule Take 1 capsule (200 mg total) by mouth 3 (three) times daily as needed. 30 capsule 2   carvedilol (COREG) 3.125 MG tablet TAKE ONE TABLET (3.125 MG TOTAL) BY MOUTH TWO TIMES DAILY WITH A MEAL. 60 tablet 1   cetirizine (ZYRTEC) 10 MG tablet Take 10 mg by mouth daily.     empagliflozin (JARDIANCE) 10 MG TABS tablet Take 1 tablet (10 mg total) by mouth daily before breakfast. 30 tablet 6   fluocinonide cream (LIDEX) 0.05 % APPLY 1 APPLICATION TO AFFECTEDE AREAS 2 TIMES DAILY AS NEEDED 30 g 1   glipiZIDE (GLUCOTROL) 5 MG tablet TAKE 2 TABLETS BY MOUTH DAILY BEFORE BREAKFAST IF AM SUGAR IS ABOVE 150 180 tablet 1   metFORMIN (GLUCOPHAGE) 500 MG tablet Take 2 tablets (1,000 mg total) by mouth 2 (two) times daily. 360 tablet 3   omeprazole (PRILOSEC) 20 MG capsule TAKE ONE CAPSULE BY MOUTH ONCE DAILY 90  capsule 3   pravastatin (PRAVACHOL) 20 MG tablet TAKE ONE TABLET BY MOUTH ONCE A DAY 90 tablet 1   psyllium (METAMUCIL) 58.6 % packet Take 1 packet by mouth daily.     sacubitril-valsartan (ENTRESTO) 24-26 MG Take 1 tablet by mouth 2 (two) times daily. 60 tablet 3   tamsulosin (FLOMAX) 0.4 MG CAPS capsule Take 1 capsule (0.4 mg total) by mouth daily. 90 capsule 3   warfarin (COUMADIN) 3 MG tablet TAKE ONE TABLET BY  MOUTH DAILY EXCEPT TAKE 1/2 TABLET ON WEDNESDAY OR AS DIRECTED BY ANTICOAGULATION CLINIC 105 tablet 1   ACCU-CHEK AVIVA PLUS test strip USE AS DIRECTED TO CHECK BLOOD SUGAR 2 TO 4 TIMES A DAY 100 each 11   Accu-Chek FastClix Lancets MISC USE AS DIRECTED TO CHECK BLOOD SUGAR TWICE DAILY 102 each 4   enoxaparin (LOVENOX) 100 MG/ML injection Inject 1 mL (100 mg total) into the skin every 12 (twelve) hours. Use as directed 20 mL 0   Lancets Misc. (ACCU-CHEK FASTCLIX LANCET) KIT AS DIRECTED 1 kit 0   spironolactone (ALDACTONE) 25 MG tablet Take 1 tablet (25 mg total) by mouth daily. 90 tablet 3   No current facility-administered medications for this visit.   BP 139/64   Pulse 60   Wt 230 lb (104.3 kg)   SpO2 100%   BMI 32.08 kg/m  General: NAD Neck: No JVD, no thyromegaly or thyroid nodule.  Lungs: Clear to auscultation bilaterally with normal respiratory effort. CV: Nondisplaced PMI.  Heart regular S1/S2 with mechanical S2, no S3/S4, 1/6 SEM RUSB.  No peripheral edema.  No carotid bruit.  Normal pedal pulses.  Abdomen: Soft, nontender, no hepatosplenomegaly, no distention.  Skin: Intact without lesions or rashes.  Neurologic: Alert and oriented x 3.  Psych: Normal affect. Extremities: No clubbing or cyanosis.  HEENT: Normal.   Assessment/plan: 1. Atrial arrhythmias: Atypical atrial flutter at 6/24 admission, had had atrial fibrillation peri-aortic valve surgery. Atrial flutter with RVR may have contributed to worsening of EF as a tachycardia-mediated CMP, but EF has not fully  recovered in NSR.  He is now in NSR on amiodarone.  - Continue amiodarone 200 mg daily.  Check LFTs, recent TSH normal. He will need a regular eye exam.  - Continue warfarin with mechanical AoV, goal INR with atrial fibrillation has been 2.5-3.5.  - He has been seen by EP with plan for atrial fibrillation/flutter ablation. This would likely allow him to safely stop amiodarone.  2. Acute on chronic systolic CHF: Echo in 1/24 with EF 45-50%.  Has baseline LBBB.  Echo in 6/24 in setting of AFL with RVR with EF 25-30%, moderate LV dilation with global HK, moderate LVH, mildly decreased RV systolic function, normally functioning mechanical AoV. It is possible that drop in EF was tachycardia-mediated with atypical atrial flutter/RVR of uncertain duration.  However, EF did not fully recover on 8/24 echo in NSR, which showed EF 30-35%, global hypokinesis, mild LVH, normal RV, mild MR, mechanical aortic valve functioning normally.  He remains in NSR.  He is not volume overloaded on exam, NYHA class I-II.  - Continue Entresto 24/26 bid.  - He does not need Lasix. - Continue Coreg 3.125 mg bid, would not increase with bradycardia.  - Continue Jardiance 10 mg daily.  - Increase spironolactone to 25 mg daily with BMET/BNP today and BMET in 10 days.  - As echo has not improved to baseline in NSR, would favor cath.  We discussed risks/benefits of right/left heart cath and he agreed to procedure.  He will need Lovenox bridge off warfarin.  3. St Jude mechanical aortic valve: h/o bicuspid aortic valve with AS.  Valve functioned normally on 6/24 echo.   - Continue warfarin, goal INR 2.5-3.5 given h/o atrial fibrillation.   4. OSA: has not tolerated CPAP. Would followup with sleep medicine for options.  5. Ascending aortic aneurysm: Has history of dilated ascending aorta in setting of bicuspid aortic valve.  Stable repaired aorta on CTA  chest in 8/24.   Followup with me after cath.   Marca Ancona 09/22/2022

## 2022-09-22 NOTE — Telephone Encounter (Signed)
Glad to do so.  I made a note in the EMR to look for his INR result on 9/3.  If his goal INR is not going to be 2.5-3.5, then please let me know.  Thanks.

## 2022-09-22 NOTE — H&P (View-Only) (Signed)
PCP: Joaquim Nam, MD HF Cardiology: Dr. Shirlee Latch  72 y.o. with history of bicuspid aortic valve s/p mechanical AVR, paroxysmal atrial fibrillation, and chronic systolic CHF presents for evaluation of CHF.  In 6/24, patient was admitted with atypical atrial flutter with RVR. Patient had St Jude mechanical AVR in 2011. Echo in 1/24 showed EF 45-50%. Patient noted palpitations for about 1 week prior to admission. He checked his HR and found it to be in the 130s. He went to see his PCP and was in atrial fibrillation vs flutter with RVR and was sent to ER. In the ER, he was in what appeared to be atypical flutter with RVR. Amiodarone gtt was started. CXR showed evidence for CHF. Mildly elevated HS-TnI with no trend. Echo during this admission showed EF 25-30%, moderate LV dilation, mild RV dysfunction, mechanical aortic valve functioning normally.  He was cardioverted back to NSR.    Repeat echo in 8/24 in NSR showed EF 30-35%, global hypokinesis, mild LVH, normal RV, mild MR, mechanical aortic valve functioning normally.   He remains in NSR today.  No dyspnea except with heavy exertion.  No chest pain.  No orthopnea/PND.  Able to climb a flight of stairs. No BRBPR/melena. Weight up 4 lbs.  No lightheadedness.  He has seen EP with plan for atrial fibrillation ablation in the future.   Labs (7/24): K 4.3, creatinine 1.25, hgb 14.9, LFTs normal, TSH normal  ECG (personally reviewed, 8/24): NSR, 1st degree AVB, LBBB 146 msec  PMH: 1. Bicuspid aortic valve: s/p St Jude mechanical AVR in 2011.  - CTA chest in 8/24 showed stable repaired aorta 2. Hyperlipidemia 3. 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 OSA 5. HTN: ACEI cough 6. Type 2 diabetes 7. Atrial fibrillation: Paroxysmal.  8. Chronic LBBB 9. Chronic systolic CHF:  - Echo (1/23): EF 45-50% - Echo (6/24) with EF 25-30%, moderate LV dilation, mild RV dysfunction, mechanical aortic valve functioning normally.  - Echo (8/24): EF  30-35%, global hypokinesis, mild LVH, normal RV, mild MR, mechanical aortic valve functioning normally.   Social History   Socioeconomic History   Marital status: Married    Spouse name: Not on file   Number of children: 2   Years of education: Not on file   Highest education level: Not on file  Occupational History   Occupation: mech. supervisor    Employer: [employer]    Comment[place]  Tobacco Use   Smoking status: Never   Smokeless tobacco: Never  Vaping Use   Vaping status: Never Used  Substance and Sexual Activity   Alcohol use: No    Alcohol/week: 0.0 standard drinks of alcohol   Drug use: No   Sexual activity: Not on file  Other Topics Concern   Not on file  Social History Narrative   Married 1996   Social Determinants of Health   Financial Resource Strain: Low Risk  (07/26/2022)   Overall Financial Resource Strain (CARDIA)    Difficulty of Paying Living Expenses: Not hard at all  Food Insecurity: No Food Insecurity (07/26/2022)   Hunger Vital Sign    Worried About Running Out of Food in the Last Year: Never true    Ran Out of Food in the Last Year: Never true  Transportation Needs: No Transportation Needs (07/26/2022)   PRAPARE - Administrator, Civil Service (Medical): No    Lack of Transportation (Non-Medical): No  Physical Activity: Inactive (12/16/2018)   Exercise Vital Sign  Days of Exercise per Week: 0 days    Minutes of Exercise per Session: 0 min  Stress: No Stress Concern Present (01/02/2022)   [employer] of Occupational Health - Occupational Stress Questionnaire    Feeling of Stress : Not at all  Social Connections: Unknown (01/02/2022)   Social Connection and Isolation Panel [NHANES]    Frequency of Communication with Friends and Family: Not on file    Frequency of Social Gatherings with Friends and Family: Not on file    Attends Religious Services: Not on file    Active Member of Clubs or Organizations: Not on file     Attends Banker Meetings: Not on file    Marital Status: Married  Intimate Partner Violence: Not At Risk (07/23/2022)   Humiliation, Afraid, Rape, and Kick questionnaire    Fear of Current or Ex-Partner: No    Emotionally Abused: No    Physically Abused: No    Sexually Abused: No   Family History  Problem Relation Age of Onset   Obesity Mother        bedridden   Hypertension Mother    Heart disease Father        CABG, CAD   Hypertension Father    Lymphoma Father    Cancer Father        lymphoma   Alcohol abuse Brother    Cancer Brother        melanoma   Colon cancer Paternal Aunt    Stomach cancer Neg Hx    Rectal cancer Neg Hx    Prostate cancer Neg Hx    ROS: All systems reviewed and negative except as per HPI.   Current Outpatient Medications  Medication Sig Dispense Refill   amiodarone (PACERONE) 200 MG tablet Take 1 tablet (200 mg total) by mouth daily. 30 tablet 3   azelastine (ASTELIN) 0.1 % nasal spray Place 2 sprays into both nostrils 2 (two) times daily as needed for rhinitis. Use in each nostril as directed 30 mL 12   benzonatate (TESSALON) 200 MG capsule Take 1 capsule (200 mg total) by mouth 3 (three) times daily as needed. 30 capsule 2   carvedilol (COREG) 3.125 MG tablet TAKE ONE TABLET (3.125 MG TOTAL) BY MOUTH TWO TIMES DAILY WITH A MEAL. 60 tablet 1   cetirizine (ZYRTEC) 10 MG tablet Take 10 mg by mouth daily.     empagliflozin (JARDIANCE) 10 MG TABS tablet Take 1 tablet (10 mg total) by mouth daily before breakfast. 30 tablet 6   fluocinonide cream (LIDEX) 0.05 % APPLY 1 APPLICATION TO AFFECTEDE AREAS 2 TIMES DAILY AS NEEDED 30 g 1   glipiZIDE (GLUCOTROL) 5 MG tablet TAKE 2 TABLETS BY MOUTH DAILY BEFORE BREAKFAST IF AM SUGAR IS ABOVE 150 180 tablet 1   metFORMIN (GLUCOPHAGE) 500 MG tablet Take 2 tablets (1,000 mg total) by mouth 2 (two) times daily. 360 tablet 3   omeprazole (PRILOSEC) 20 MG capsule TAKE ONE CAPSULE BY MOUTH ONCE DAILY 90  capsule 3   pravastatin (PRAVACHOL) 20 MG tablet TAKE ONE TABLET BY MOUTH ONCE A DAY 90 tablet 1   psyllium (METAMUCIL) 58.6 % packet Take 1 packet by mouth daily.     sacubitril-valsartan (ENTRESTO) 24-26 MG Take 1 tablet by mouth 2 (two) times daily. 60 tablet 3   tamsulosin (FLOMAX) 0.4 MG CAPS capsule Take 1 capsule (0.4 mg total) by mouth daily. 90 capsule 3   warfarin (COUMADIN) 3 MG tablet TAKE ONE TABLET BY  MOUTH DAILY EXCEPT TAKE 1/2 TABLET ON WEDNESDAY OR AS DIRECTED BY ANTICOAGULATION CLINIC 105 tablet 1   ACCU-CHEK AVIVA PLUS test strip USE AS DIRECTED TO CHECK BLOOD SUGAR 2 TO 4 TIMES A DAY 100 each 11   Accu-Chek FastClix Lancets MISC USE AS DIRECTED TO CHECK BLOOD SUGAR TWICE DAILY 102 each 4   enoxaparin (LOVENOX) 100 MG/ML injection Inject 1 mL (100 mg total) into the skin every 12 (twelve) hours. Use as directed 20 mL 0   Lancets Misc. (ACCU-CHEK FASTCLIX LANCET) KIT AS DIRECTED 1 kit 0   spironolactone (ALDACTONE) 25 MG tablet Take 1 tablet (25 mg total) by mouth daily. 90 tablet 3   No current facility-administered medications for this visit.   BP 139/64   Pulse 60   Wt 230 lb (104.3 kg)   SpO2 100%   BMI 32.08 kg/m  General: NAD Neck: No JVD, no thyromegaly or thyroid nodule.  Lungs: Clear to auscultation bilaterally with normal respiratory effort. CV: Nondisplaced PMI.  Heart regular S1/S2 with mechanical S2, no S3/S4, 1/6 SEM RUSB.  No peripheral edema.  No carotid bruit.  Normal pedal pulses.  Abdomen: Soft, nontender, no hepatosplenomegaly, no distention.  Skin: Intact without lesions or rashes.  Neurologic: Alert and oriented x 3.  Psych: Normal affect. Extremities: No clubbing or cyanosis.  HEENT: Normal.   Assessment/plan: 1. Atrial arrhythmias: Atypical atrial flutter at 6/24 admission, had had atrial fibrillation peri-aortic valve surgery. Atrial flutter with RVR may have contributed to worsening of EF as a tachycardia-mediated CMP, but EF has not fully  recovered in NSR.  He is now in NSR on amiodarone.  - Continue amiodarone 200 mg daily.  Check LFTs, recent TSH normal. He will need a regular eye exam.  - Continue warfarin with mechanical AoV, goal INR with atrial fibrillation has been 2.5-3.5.  - He has been seen by EP with plan for atrial fibrillation/flutter ablation. This would likely allow him to safely stop amiodarone.  2. Acute on chronic systolic CHF: Echo in 1/24 with EF 45-50%.  Has baseline LBBB.  Echo in 6/24 in setting of AFL with RVR with EF 25-30%, moderate LV dilation with global HK, moderate LVH, mildly decreased RV systolic function, normally functioning mechanical AoV. It is possible that drop in EF was tachycardia-mediated with atypical atrial flutter/RVR of uncertain duration.  However, EF did not fully recover on 8/24 echo in NSR, which showed EF 30-35%, global hypokinesis, mild LVH, normal RV, mild MR, mechanical aortic valve functioning normally.  He remains in NSR.  He is not volume overloaded on exam, NYHA class I-II.  - Continue Entresto 24/26 bid.  - He does not need Lasix. - Continue Coreg 3.125 mg bid, would not increase with bradycardia.  - Continue Jardiance 10 mg daily.  - Increase spironolactone to 25 mg daily with BMET/BNP today and BMET in 10 days.  - As echo has not improved to baseline in NSR, would favor cath.  We discussed risks/benefits of right/left heart cath and he agreed to procedure.  He will need Lovenox bridge off warfarin.  3. St Jude mechanical aortic valve: h/o bicuspid aortic valve with AS.  Valve functioned normally on 6/24 echo.   - Continue warfarin, goal INR 2.5-3.5 given h/o atrial fibrillation.   4. OSA: has not tolerated CPAP. Would followup with sleep medicine for options.  5. Ascending aortic aneurysm: Has history of dilated ascending aorta in setting of bicuspid aortic valve.  Stable repaired aorta on CTA  chest in 8/24.   Followup with me after cath.   Marca Ancona 09/22/2022

## 2022-09-24 NOTE — Telephone Encounter (Signed)
LVM for pt to call to schedule a lab apt at [facility] for POCT INR.

## 2022-09-25 ENCOUNTER — Telehealth: Payer: Self-pay

## 2022-09-25 NOTE — Telephone Encounter (Signed)
Pt's wife, Fulton Mole, returned call. Advised to continue lovenox Q12H and take 1 tablet of warfarin tonight. Pt's normal dose of warfarin today would have been 1/2 tablet. Advised this nurse will discuss case with PCP tomorrow and contact her with further dosing instructions. [name] read back instructions and verbalized understanding.

## 2022-09-25 NOTE — Telephone Encounter (Signed)
Would like to restart warfarin at increased dose for 4 days and then normal dosing. Continue lovenox injections for 5 days. Pt would have 5 day reprieve from using lovenox and then start lovenox bridge again for surgery that has been rescheduled for 9/12.  Tried to contact pt but no answer. Had to LVM for pt to return call.

## 2022-09-25 NOTE — Telephone Encounter (Signed)
Spoke with [name] [name] to make him aware that his ins Berkley Harvey its still pending for his [place] ( initially sched for tomorrow and spoke with Evangeline Gula from [facility] cath to cancel) [facility] was rescheduled for September 12 at 1000 @[facility] .  [name] [name] aware, agreeable, and verbalized understanding.  Msg Barnett Hatter to make her aware of changes. [name] [name] aware, and  agreeable with plan. Shannon,RN to call [name] [name] with Warfarin med advice.    MD aware.

## 2022-09-25 NOTE — Telephone Encounter (Signed)
Agree.  Thanks.  Please talk to me on Thursday at clinic.

## 2022-09-26 ENCOUNTER — Ambulatory Visit: Payer: Medicare PPO

## 2022-09-26 ENCOUNTER — Ambulatory Visit (HOSPITAL_COMMUNITY): Admission: RE | Admit: 2022-09-26 | Payer: Medicare PPO | Source: Home / Self Care | Admitting: Cardiology

## 2022-09-26 ENCOUNTER — Encounter (HOSPITAL_COMMUNITY): Admission: RE | Payer: Self-pay | Source: Home / Self Care

## 2022-09-26 DIAGNOSIS — Z7901 Long term (current) use of anticoagulants: Secondary | ICD-10-CM

## 2022-09-26 SURGERY — RIGHT/LEFT HEART CATH AND CORONARY ANGIOGRAPHY
Anesthesia: LOCAL

## 2022-09-26 MED ORDER — ENOXAPARIN SODIUM 100 MG/ML IJ SOSY
100.0000 mg | PREFILLED_SYRINGE | Freq: Two times a day (BID) | INTRAMUSCULAR | 0 refills | Status: DC
Start: 2022-09-26 — End: 2022-11-07

## 2022-09-26 NOTE — Progress Notes (Signed)
Heart cath scheduled originally for today but was rescheduled to 9/12. Pt had already started the lovenox bridge when he was told the surgery would have to be rescheduled. Pt is in today for just lovenox bridge updates to lovenox bridge.  Pt just came in to pick up instructions. INR was not checked.  8/29: Take 1 1/2 tablets warfarin (4.5 mg), inject Lovenox twice daily about 12 hours apart 8/30: Take 1 1/2 tablets warfarin (4.5 mg), inject Lovenox twice daily about 12 hours apart 8/31: Take 1 1/2 tablets warfarin(4.5 mg), inject Lovenox twice daily about 12 hours apart 9/1: Take 1 tablet of warfarin (3 mg), STOP Lovenox injections 9/2: Take 1 tablet of warfarin (3 mg) 9/3: Take 1 tablet of warfarin (3 mg) 9/4: Take 1/2 tablet of warfarin (1.5 mg) 9/5: Take 1 tablet of warfarin (3 mg) 9/6: Take 1 tablet of warfarin (3 mg) 9/7: Take 1 tablet of warfarin (3 mg); last dose until after surgery   9/8: NO WARFARIN, NO LOVENOX 9/9: NO WARFARIN, Lovenox twice daily about 12 hours apart 9/10: NO WARFARIN, Lovenox twice daily about 12 hours apart 9/11: NO WARFARIN, Lovenox MORNING ONLY (BEFORE 7 AM)   9/12: SURGERY; NO WARFARIN, NO LOVENOX   9/13: Take 1 1/2 tablet of warfarin (4.5 mg), inject Lovenox twice daily about 12 hours apart 9/14: Take 1 1/2 tablet of warfarin (4.5 mg), inject Lovenox twice daily about 12 hours apart 9/15: Take 1 1/2 tablet of warfarin (4.5 mg), inject Lovenox twice daily about 12 hours apart 9/16: Take 1 1/2 tablet of warfarin (4.5 mg), inject Lovenox twice daily about 12 hours apart 9/17: Take 1 tablet warfarin (3 mg), STOP Lovenox injections 9/18: Take 1/2 tablet warfarin (1.5 mg) 9/19: Recheck INR; no warfarin until recheck

## 2022-09-26 NOTE — Telephone Encounter (Signed)
Contacted pt and advised of instructions he will need for dosing from now until post surgery. Pt agreed to come in today to the coumadin clinic to pick up instructions. Pt will not need testing due to pt being on lovenox bridge and not back on warfarin long enough to reach his INR range.  Scheduled pt for 4 pm today. Pt reports he has 14 syringes left. Will subtract these from the total amount pt will need.   Pt will need a total of 18 syringes for provided dosing schedule. He currently has 14 syringes so he will need an additional 4 syringes. Will discuss with pt where he would like the script sent when he is in today.

## 2022-09-26 NOTE — Telephone Encounter (Addendum)
Lovenox bridge schedule for now through post-surgery.  Lovenox 100mg ,  Q12H.  8/30: Take 1 1/2 tablets warfarin (4.5 mg), inject Lovenox twice daily about 12 hours apart 8/31: Take 1 1/2 tablets warfarin(4.5 mg), inject Lovenox twice daily about 12 hours apart 9/1: Take 1 tablet of warfarin (3 mg), STOP Lovenox injections 9/2: Take 1 tablet of warfarin (3 mg) 9/3: Take 1 tablet of warfarin (3 mg) 9/4: Take 1/2 tablet of warfarin (1.5 mg) 9/5: Take 1 tablet of warfarin (3 mg) 9/6: Take 1 tablet of warfarin (3 mg) 9/7: Take 1 tablet of warfarin (3 mg); last dose until after surgery   9/8: NO WARFARIN, NO LOVENOX 9/9: NO WARFARIN, Lovenox twice daily about 12 hours apart 9/10: NO WARFARIN, Lovenox twice daily about 12 hours apart 9/11: NO WARFARIN, Lovenox MORNING ONLY (BEFORE 7 AM)   9/12: SURGERY; NO WARFARIN, NO LOVENOX   9/13: Take 1 1/2 tablet of warfarin (4.5 mg), inject Lovenox twice daily about 12 hours apart 9/14: Take 1 1/2 tablet of warfarin (4.5 mg), inject Lovenox twice daily about 12 hours apart 9/15: Take 1 1/2 tablet of warfarin (4.5 mg), inject Lovenox twice daily about 12 hours apart 9/16: Take 1 1/2 tablet of warfarin (4.5 mg), inject Lovenox twice daily about 12 hours apart 9/17: Take 1 tablet warfarin (3 mg), STOP Lovenox injections 9/18: Take 1/2 tablet warfarin (1.5 mg) 9/19: Recheck INR; no warfarin until recheck

## 2022-09-26 NOTE — Patient Instructions (Addendum)
Pre visit review using our clinic review tool, if applicable. No additional management support is needed unless otherwise documented below in the visit note.  8/29: Take 1 1/2 tablets warfarin (4.5 mg), inject Lovenox twice daily about 12 hours apart 8/30: Take 1 1/2 tablets warfarin (4.5 mg), inject Lovenox twice daily about 12 hours apart 8/31: Take 1 1/2 tablets warfarin(4.5 mg), inject Lovenox twice daily about 12 hours apart 9/1: Take 1 tablet of warfarin (3 mg), STOP Lovenox injections 9/2: Take 1 tablet of warfarin (3 mg) 9/3: Take 1 tablet of warfarin (3 mg) 9/4: Take 1/2 tablet of warfarin (1.5 mg) 9/5: Take 1 tablet of warfarin (3 mg) 9/6: Take 1 tablet of warfarin (3 mg) 9/7: Take 1 tablet of warfarin (3 mg); last dose until after surgery   9/8: NO WARFARIN, NO LOVENOX 9/9: NO WARFARIN, Lovenox twice daily about 12 hours apart 9/10: NO WARFARIN, Lovenox twice daily about 12 hours apart 9/11: NO WARFARIN, Lovenox MORNING ONLY (BEFORE 7 AM)   9/12: SURGERY; NO WARFARIN, NO LOVENOX   9/13: Take 1 1/2 tablet of warfarin (4.5 mg), inject Lovenox twice daily about 12 hours apart 9/14: Take 1 1/2 tablet of warfarin (4.5 mg), inject Lovenox twice daily about 12 hours apart 9/15: Take 1 1/2 tablet of warfarin (4.5 mg), inject Lovenox twice daily about 12 hours apart 9/16: Take 1 1/2 tablet of warfarin (4.5 mg), inject Lovenox twice daily about 12 hours apart 9/17: Take 1 tablet warfarin (3 mg), STOP Lovenox injections 9/18: Take 1/2 tablet warfarin (1.5 mg) 9/19: Recheck INR; no warfarin until recheck

## 2022-09-27 ENCOUNTER — Telehealth (HOSPITAL_COMMUNITY): Payer: Self-pay | Admitting: *Deleted

## 2022-09-27 NOTE — Telephone Encounter (Signed)
Agree. Thanks

## 2022-09-27 NOTE — Telephone Encounter (Signed)
R/l heart cath approved   Approved Authorization #[id number]  Tracking #[id number]

## 2022-09-28 ENCOUNTER — Other Ambulatory Visit (HOSPITAL_COMMUNITY): Payer: Self-pay

## 2022-10-07 ENCOUNTER — Other Ambulatory Visit: Payer: Self-pay | Admitting: Family Medicine

## 2022-10-10 ENCOUNTER — Encounter
Admission: RE | Disposition: A | Discharge status: Home / Self Care | Payer: Self-pay | Source: Home / Self Care | Attending: Cardiology

## 2022-10-10 ENCOUNTER — Ambulatory Visit
Admission: RE | Admit: 2022-10-10 | Discharge: 2022-10-10 | Disposition: A | Discharge status: Home / Self Care | Payer: Medicare PPO | Attending: Cardiology | Admitting: Cardiology

## 2022-10-10 ENCOUNTER — Other Ambulatory Visit: Payer: Self-pay

## 2022-10-10 ENCOUNTER — Encounter: Payer: Self-pay | Admitting: Cardiology

## 2022-10-10 DIAGNOSIS — I480 Paroxysmal atrial fibrillation: Secondary | ICD-10-CM | POA: Diagnosis not present

## 2022-10-10 DIAGNOSIS — I2510 Atherosclerotic heart disease of native coronary artery without angina pectoris: Secondary | ICD-10-CM | POA: Insufficient documentation

## 2022-10-10 DIAGNOSIS — I484 Atypical atrial flutter: Secondary | ICD-10-CM | POA: Insufficient documentation

## 2022-10-10 DIAGNOSIS — G4733 Obstructive sleep apnea (adult) (pediatric): Secondary | ICD-10-CM | POA: Insufficient documentation

## 2022-10-10 DIAGNOSIS — Z7901 Long term (current) use of anticoagulants: Secondary | ICD-10-CM | POA: Insufficient documentation

## 2022-10-10 DIAGNOSIS — Z952 Presence of prosthetic heart valve: Secondary | ICD-10-CM | POA: Diagnosis not present

## 2022-10-10 DIAGNOSIS — I509 Heart failure, unspecified: Secondary | ICD-10-CM

## 2022-10-10 DIAGNOSIS — Z79899 Other long term (current) drug therapy: Secondary | ICD-10-CM | POA: Insufficient documentation

## 2022-10-10 DIAGNOSIS — I5023 Acute on chronic systolic (congestive) heart failure: Secondary | ICD-10-CM | POA: Insufficient documentation

## 2022-10-10 DIAGNOSIS — I110 Hypertensive heart disease with heart failure: Secondary | ICD-10-CM | POA: Diagnosis not present

## 2022-10-10 DIAGNOSIS — I7121 Aneurysm of the ascending aorta, without rupture: Secondary | ICD-10-CM | POA: Insufficient documentation

## 2022-10-10 DIAGNOSIS — I428 Other cardiomyopathies: Secondary | ICD-10-CM | POA: Diagnosis not present

## 2022-10-10 HISTORY — PX: RIGHT/LEFT HEART CATH AND CORONARY ANGIOGRAPHY: CATH118266

## 2022-10-10 LAB — PROTIME-INR
INR: 1.3 — ABNORMAL HIGH (ref 0.8–1.2)
Prothrombin Time: 16.2 s — ABNORMAL HIGH (ref 11.4–15.2)

## 2022-10-10 LAB — POCT I-STAT EG7
Acid-base deficit: 3 mmol/L — ABNORMAL HIGH (ref 0.0–2.0)
Bicarbonate: 22.5 mmol/L (ref 20.0–28.0)
Calcium, Ion: 1.14 mmol/L — ABNORMAL LOW (ref 1.15–1.40)
HCT: 41 % (ref 39.0–52.0)
Hemoglobin: 13.9 g/dL (ref 13.0–17.0)
O2 Saturation: 69 %
Potassium: 3.8 mmol/L (ref 3.5–5.1)
Sodium: 138 mmol/L (ref 135–145)
TCO2: 24 mmol/L (ref 22–32)
pCO2, Ven: 40.1 mmHg — ABNORMAL LOW (ref 44–60)
pH, Ven: 7.358 (ref 7.25–7.43)
pO2, Ven: 37 mmHg (ref 32–45)

## 2022-10-10 LAB — GLUCOSE, CAPILLARY: Glucose-Capillary: 136 mg/dL — ABNORMAL HIGH (ref 70–99)

## 2022-10-10 SURGERY — RIGHT/LEFT HEART CATH AND CORONARY ANGIOGRAPHY
Anesthesia: Moderate Sedation | Laterality: Bilateral

## 2022-10-10 MED ORDER — SODIUM CHLORIDE 0.9% FLUSH: 3.0000 mL | INTRAVENOUS | Status: DC | PRN

## 2022-10-10 MED ORDER — VERAPAMIL HCL 2.5 MG/ML IV SOLN
INTRAVENOUS | Status: AC
  Filled 2022-10-10: qty 2

## 2022-10-10 MED ORDER — MIDAZOLAM HCL 2 MG/2ML IJ SOLN
INTRAMUSCULAR | Status: AC
  Filled 2022-10-10: qty 2

## 2022-10-10 MED ORDER — VERAPAMIL HCL 2.5 MG/ML IV SOLN: INTRAVENOUS | Status: DC | PRN

## 2022-10-10 MED ORDER — SODIUM CHLORIDE 0.9% FLUSH: 3.0000 mL | Freq: Two times a day (BID) | INTRAVENOUS | Status: DC

## 2022-10-10 MED ORDER — HEPARIN (PORCINE) IN NACL 1000-0.9 UT/500ML-% IV SOLN
INTRAVENOUS | Status: AC
  Filled 2022-10-10: qty 1000

## 2022-10-10 MED ORDER — SODIUM CHLORIDE 0.9 % IV SOLN: 250.0000 mL | INTRAVENOUS | Status: DC | PRN

## 2022-10-10 MED ORDER — HYDRALAZINE HCL 20 MG/ML IJ SOLN: 10.0000 mg | INTRAMUSCULAR | Status: DC | PRN

## 2022-10-10 MED ORDER — HEPARIN SODIUM (PORCINE) 1000 UNIT/ML IJ SOLN
INTRAMUSCULAR | Status: AC
  Filled 2022-10-10: qty 10

## 2022-10-10 MED ORDER — SODIUM CHLORIDE 0.9 % IV SOLN: INTRAVENOUS | Status: DC

## 2022-10-10 MED ORDER — FENTANYL CITRATE (PF) 100 MCG/2ML IJ SOLN
INTRAMUSCULAR | Status: AC
  Filled 2022-10-10: qty 2

## 2022-10-10 MED ORDER — HEPARIN (PORCINE) IN NACL 2000-0.9 UNIT/L-% IV SOLN
INTRAVENOUS | Status: DC | PRN
  Administered 2022-10-10: 1000 mL

## 2022-10-10 MED ORDER — MIDAZOLAM HCL 2 MG/2ML IJ SOLN
INTRAMUSCULAR | Status: DC | PRN
  Administered 2022-10-10: 1 mg via INTRAVENOUS
  Administered 2022-10-10: .5 mg via INTRAVENOUS

## 2022-10-10 MED ORDER — ONDANSETRON HCL 4 MG/2ML IJ SOLN: 4.0000 mg | Freq: Four times a day (QID) | INTRAMUSCULAR | Status: DC | PRN

## 2022-10-10 MED ORDER — FENTANYL CITRATE (PF) 100 MCG/2ML IJ SOLN
INTRAMUSCULAR | Status: DC | PRN
  Administered 2022-10-10: 25 ug via INTRAVENOUS
  Administered 2022-10-10: 12.5 ug via INTRAVENOUS

## 2022-10-10 MED ORDER — ASPIRIN 81 MG PO CHEW: 81.0000 mg | CHEWABLE_TABLET | ORAL | Status: DC

## 2022-10-10 MED ORDER — HEPARIN SODIUM (PORCINE) 1000 UNIT/ML IJ SOLN
INTRAMUSCULAR | Status: DC | PRN
  Administered 2022-10-10: 5000 [IU] via INTRAVENOUS

## 2022-10-10 MED ORDER — IOHEXOL 350 MG/ML SOLN
INTRAVENOUS | Status: DC | PRN
  Administered 2022-10-10: 75 mL

## 2022-10-10 MED ORDER — LABETALOL HCL 5 MG/ML IV SOLN: 10.0000 mg | INTRAVENOUS | Status: DC | PRN

## 2022-10-10 MED ORDER — ACETAMINOPHEN 325 MG PO TABS: 650.0000 mg | ORAL_TABLET | ORAL | Status: DC | PRN

## 2022-10-10 SURGICAL SUPPLY — 13 items
CATH 5FR JL3.5 JR4 ANG PIG MP (CATHETERS) IMPLANT
CATH BALLN WEDGE 5F 110CM (CATHETERS) IMPLANT
CATH INFINITI 5 FR 3DRC (CATHETERS) IMPLANT
DEVICE RAD TR BAND REGULAR (VASCULAR PRODUCTS) IMPLANT
DRAPE BRACHIAL (DRAPES) IMPLANT
GLIDESHEATH SLEND SS 6F .021 (SHEATH) IMPLANT
GUIDEWIRE INQWIRE 1.5J.035X260 (WIRE) IMPLANT
INQWIRE 1.5J .035X260CM (WIRE) ×1
PACK CARDIAC CATH (CUSTOM PROCEDURE TRAY) IMPLANT
PROTECTION STATION PRESSURIZED (MISCELLANEOUS) ×1
SET ATX-X65L (MISCELLANEOUS) IMPLANT
SHEATH GLIDE SLENDER 4/5FR (SHEATH) IMPLANT
STATION PROTECTION PRESSURIZED (MISCELLANEOUS) IMPLANT

## 2022-10-10 NOTE — Interval H&P Note (Signed)
History and Physical Interval Note:  10/10/2022 9:42 AM  [name] [name]  has presented today for surgery, with the diagnosis of R and L Cath   Heart failure.  The various methods of treatment have been discussed with the patient and family. After consideration of risks, benefits and other options for treatment, the patient has consented to  Procedure(s): RIGHT/LEFT HEART CATH AND CORONARY ANGIOGRAPHY (Bilateral) as a surgical intervention.  The patient's history has been reviewed, patient examined, no change in status, stable for surgery.  I have reviewed the patient's chart and labs.  Questions were answered to the patient's satisfaction.     Wallace Gappa [employer]

## 2022-10-10 NOTE — Discharge Instructions (Signed)
Pharmacist to direct restart of Lovenox and warfarin this evening.

## 2022-10-11 ENCOUNTER — Other Ambulatory Visit: Payer: Self-pay | Admitting: Cardiology

## 2022-10-11 ENCOUNTER — Encounter: Payer: Self-pay | Admitting: Cardiology

## 2022-10-17 ENCOUNTER — Ambulatory Visit: Payer: Medicare PPO

## 2022-10-17 DIAGNOSIS — Z7901 Long term (current) use of anticoagulants: Secondary | ICD-10-CM | POA: Diagnosis not present

## 2022-10-17 LAB — POCT INR: INR: 1.9 — AB (ref 2.0–3.0)

## 2022-10-17 NOTE — Progress Notes (Addendum)
Pt had heart cath on 9/12 and was on a lovenox bridge. Pt has finished lovenox. Increase dose today to take 1 1/2 tablets and increase dose tomorrow to take 1  1/2 tablets and then continue 1 tablet daily except take 1/2 tablet on Wednesday. Recheck in 3 weeks.

## 2022-10-17 NOTE — Patient Instructions (Addendum)
Pre visit review using our clinic review tool, if applicable. No additional management support is needed unless otherwise documented below in the visit note.  Increase dose today to take 1 1/2 tablets and increase dose tomorrow to take 1  1/2 tablets and then continue 1 tablet daily except take 1/2 tablet on Wednesday. Recheck in 3 weeks.

## 2022-10-22 ENCOUNTER — Telehealth: Payer: Self-pay | Admitting: Family Medicine

## 2022-10-22 NOTE — Telephone Encounter (Signed)
Copied from CRM [id number]. Topic: Medicare AWV >> Oct 22, 2022  1:37 PM Payton Doughty wrote: Reason for CRM: LVM AWV scheduled too soon, AWV due 12/2022 khc  Verlee Rossetti; Care Guide Ambulatory Clinical Support [place] [facility] Direct Dial: [phone]

## 2022-10-28 ENCOUNTER — Ambulatory Visit: Payer: Medicare PPO | Attending: Cardiology | Admitting: Cardiology

## 2022-10-28 VITALS — BP 134/65 | HR 57 | Wt 227.0 lb

## 2022-10-28 DIAGNOSIS — I509 Heart failure, unspecified: Secondary | ICD-10-CM | POA: Diagnosis not present

## 2022-10-28 MED ORDER — ENTRESTO 49-51 MG PO TABS: 1.0000 | ORAL_TABLET | Freq: Two times a day (BID) | ORAL | 3 refills | Status: DC

## 2022-10-28 NOTE — Progress Notes (Signed)
PCP: Joaquim Nam, MD HF Cardiology: Dr. Shirlee Latch  72 y.o. with history of bicuspid aortic valve s/p mechanical AVR, paroxysmal atrial fibrillation, and chronic systolic CHF presents for evaluation of CHF.  In 6/24, patient was admitted with atypical atrial flutter with RVR. Patient had St Jude mechanical AVR in 2011. Echo in 1/24 showed EF 45-50%. Patient noted palpitations for about 1 week prior to admission. He checked his HR and found it to be in the 130s. He went to see his PCP and was in atrial fibrillation vs flutter with RVR and was sent to ER. In the ER, he was in what appeared to be atypical flutter with RVR. Amiodarone gtt was started. CXR showed evidence for CHF. Mildly elevated HS-TnI with no trend. Echo during this admission showed EF 25-30%, moderate LV dilation, mild RV dysfunction, mechanical aortic valve functioning normally.  He was cardioverted back to NSR.    Repeat echo in 8/24 in NSR showed EF 30-35%, global hypokinesis, mild LVH, normal RV, mild MR, mechanical aortic valve functioning normally.   LHC/RHC in 9/24 showed PCWP 5, CI 2.81; normal coronaries.   He remains in NSR today.  His atrial fibrillation ablation is scheduled for 1/25.  No palpitations.  Occasional atypical chest pain that sounds like GERD.  No significant exertional dyspnea.  No orthopnea/PND.     Labs (7/24): K 4.3, creatinine 1.25, hgb 14.9, LFTs normal, TSH normal Labs (8/24): K 4.3, creatinine 1.06, BNP 56, LFTs normal  ECG (personally reviewed, 8/24): NSR, LBBB 150 msec  PMH: 1. Bicuspid aortic valve: s/p St Jude mechanical AVR in 2011.  - CTA chest in 8/24 showed stable repaired aorta 2. Hyperlipidemia 3. 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 OSA 5. HTN: ACEI cough 6. Type 2 diabetes 7. Atrial fibrillation: Paroxysmal.  8. Chronic LBBB 9. Chronic systolic CHF:  - Echo (1/23): EF 45-50% - Echo (6/24) with EF 25-30%, moderate LV dilation, mild RV dysfunction, mechanical  aortic valve functioning normally.  - Echo (8/24): EF 30-35%, global hypokinesis, mild LVH, normal RV, mild MR, mechanical aortic valve functioning normally.  - LHC/RHC (9/24): mean RA 8, PA 28/10 mean 21, mean PCWP 5, CI 2.81; normal coronaries.   Social History   Socioeconomic History   Marital status: Married    Spouse name: Not on file   Number of children: 2   Years of education: Not on file   Highest education level: Not on file  Occupational History   Occupation: mech. supervisor    Employer: [employer]    Comment: [place]  Tobacco Use   Smoking status: Never   Smokeless tobacco: Never  Vaping Use   Vaping status: Never Used  Substance and Sexual Activity   Alcohol use: No    Alcohol/week: 0.0 standard drinks of alcohol   Drug use: No   Sexual activity: Not on file  Other Topics Concern   Not on file  Social History Narrative   Married 1996   Social Determinants of Health   Financial Resource Strain: Low Risk  (07/26/2022)   Overall Financial Resource Strain (CARDIA)    Difficulty of Paying Living Expenses: Not hard at all  Food Insecurity: No Food Insecurity (07/26/2022)   Hunger Vital Sign    Worried About Running Out of Food in the Last Year: Never true    Ran Out of Food in the Last Year: Never true  Transportation Needs: No Transportation Needs (07/26/2022)   PRAPARE - Transportation  Lack of Transportation (Medical): No    Lack of Transportation (Non-Medical): No  Physical Activity: Inactive (12/16/2018)   Exercise Vital Sign    Days of Exercise per Week: 0 days    Minutes of Exercise per Session: 0 min  Stress: No Stress Concern Present (01/02/2022)   [employer] of Occupational Health - Occupational Stress Questionnaire    Feeling of Stress : Not at all  Social Connections: Unknown (01/02/2022)   Social Connection and Isolation Panel [NHANES]    Frequency of Communication with Friends and Family: Not on file    Frequency of Social  Gatherings with Friends and Family: Not on file    Attends Religious Services: Not on file    Active Member of Clubs or Organizations: Not on file    Attends Banker Meetings: Not on file    Marital Status: Married  Intimate Partner Violence: Not At Risk (07/23/2022)   Humiliation, Afraid, Rape, and Kick questionnaire    Fear of Current or Ex-Partner: No    Emotionally Abused: No    Physically Abused: No    Sexually Abused: No   Family History  Problem Relation Age of Onset   Obesity Mother        bedridden   Hypertension Mother    Heart disease Father        CABG, CAD   Hypertension Father    Lymphoma Father    Cancer Father        lymphoma   Alcohol abuse Brother    Cancer Brother        melanoma   Colon cancer Paternal Aunt    Stomach cancer Neg Hx    Rectal cancer Neg Hx    Prostate cancer Neg Hx    ROS: All systems reviewed and negative except as per HPI.   Current Outpatient Medications  Medication Sig Dispense Refill   ACCU-CHEK AVIVA PLUS test strip USE AS DIRECTED TO CHECK BLOOD SUGAR TWO TO FOUR TIMES A DAY 100 strip 11   Accu-Chek FastClix Lancets MISC USE AS DIRECTED TO CHECK BLOOD SUGAR TWICE DAILY 102 each 4   amiodarone (PACERONE) 200 MG tablet Take 1 tablet (200 mg total) by mouth daily. 30 tablet 3   azelastine (ASTELIN) 0.1 % nasal spray Place 2 sprays into both nostrils 2 (two) times daily as needed for rhinitis. Use in each nostril as directed 30 mL 12   benzonatate (TESSALON) 200 MG capsule Take 1 capsule (200 mg total) by mouth 3 (three) times daily as needed. 30 capsule 2   carvedilol (COREG) 3.125 MG tablet TAKE ONE TABLET (3.125 MG TOTAL) BY MOUTH TWO TIMES DAILY WITH A MEAL. 60 tablet 1   cetirizine (ZYRTEC) 10 MG tablet Take 10 mg by mouth daily.     empagliflozin (JARDIANCE) 10 MG TABS tablet TAKE ONE TABLET BY MOUTH DAILY BEFORE BREAKFAST 30 tablet 3   enoxaparin (LOVENOX) 100 MG/ML injection Inject 1 mL (100 mg total) into the skin  every 12 (twelve) hours. AS DIRECTED BY ANTICOAGULATION CLINIC 4 mL 0   fluocinonide cream (LIDEX) 0.05 % APPLY 1 APPLICATION TO AFFECTEDE AREAS 2 TIMES DAILY AS NEEDED 30 g 1   glipiZIDE (GLUCOTROL) 5 MG tablet TAKE 2 TABLETS BY MOUTH DAILY BEFORE BREAKFAST IF AM SUGAR IS ABOVE 150 180 tablet 1   Lancets Misc. (ACCU-CHEK FASTCLIX LANCET) KIT AS DIRECTED 1 kit 0   metFORMIN (GLUCOPHAGE) 500 MG tablet Take 2 tablets (1,000 mg total) by mouth 2 (  two) times daily. 360 tablet 3   omeprazole (PRILOSEC) 20 MG capsule TAKE ONE CAPSULE BY MOUTH ONCE DAILY 90 capsule 3   pravastatin (PRAVACHOL) 20 MG tablet TAKE ONE TABLET BY MOUTH ONCE A DAY 90 tablet 1   psyllium (METAMUCIL) 58.6 % packet Take 1 packet by mouth daily.     sacubitril-valsartan (ENTRESTO) 49-51 MG Take 1 tablet by mouth 2 (two) times daily. 60 tablet 3   spironolactone (ALDACTONE) 25 MG tablet Take 1 tablet (25 mg total) by mouth daily. 90 tablet 3   tamsulosin (FLOMAX) 0.4 MG CAPS capsule Take 1 capsule (0.4 mg total) by mouth daily. 90 capsule 3   warfarin (COUMADIN) 3 MG tablet TAKE ONE TABLET BY MOUTH DAILY EXCEPT TAKE 1/2 TABLET ON WEDNESDAY OR AS DIRECTED BY ANTICOAGULATION CLINIC 105 tablet 1   No current facility-administered medications for this visit.   BP 134/65   Pulse (!) 57   Wt 227 lb (103 kg)   SpO2 99%   BMI 31.66 kg/m  General: NAD Neck: No JVD, no thyromegaly or thyroid nodule.  Lungs: Clear to auscultation bilaterally with normal respiratory effort. CV: Nondisplaced PMI.  Heart regular S1/S2 with mechanical S2, no S3/S4, 2/6 SEM RUSB.  No peripheral edema.  No carotid bruit.  Normal pedal pulses.  Abdomen: Soft, nontender, no hepatosplenomegaly, no distention.  Skin: Intact without lesions or rashes.  Neurologic: Alert and oriented x 3.  Psych: Normal affect. Extremities: No clubbing or cyanosis.  HEENT: Normal.   Assessment/plan: 1. Atrial arrhythmias: Atypical atrial flutter at 6/24 admission, had had  atrial fibrillation peri-aortic valve surgery. Atrial flutter with RVR may have contributed to worsening of EF as a tachycardia-mediated CMP, but EF has not fully recovered in NSR.  He is now in NSR on amiodarone.  - Continue amiodarone 200 mg daily.  Check LFTs and TSH. He will need a regular eye exam.  - Continue warfarin with mechanical AoV, goal INR with atrial fibrillation has been 2.5-3.5.  - He has been seen by EP with plan for atrial fibrillation/flutter ablation. This would likely allow him to safely stop amiodarone.  2. Acute on chronic systolic CHF: Echo in 1/24 with EF 45-50%.  Has baseline LBBB.  Echo in 6/24 in setting of AFL with RVR with EF 25-30%, moderate LV dilation with global HK, moderate LVH, mildly decreased RV systolic function, normally functioning mechanical AoV. It is possible that drop in EF was tachycardia-mediated with atypical atrial flutter/RVR of uncertain duration.  However, EF did not fully recover on 8/24 echo in NSR, which showed EF 30-35%, global hypokinesis, mild LVH, normal RV, mild MR, mechanical aortic valve functioning normally.  LHC/RHC in 9/24 showed no significant coronary disease.  He remains in NSR.  He is not volume overloaded on exam, NYHA class I-II.  - Increase Entresto to 49/51 bid.  BMET/BNP today and BMET in 10 days.  - He does not need Lasix. - Continue Coreg 3.125 mg bid, would not increase with bradycardia.  - Continue Jardiance 10 mg daily.  - Continue spironolactone 25 mg daily.  - ECG today shows LBBB now 150 msec.  Will repeat echo in 3-4 more months (schedule at next appt); if EF remains low, would favor CRT-D.   3. St Jude mechanical aortic valve: h/o bicuspid aortic valve with AS.  Valve functioned normally on 6/24 echo.   - Continue warfarin, goal INR 2.5-3.5 given h/o atrial fibrillation.   4. OSA: has not tolerated CPAP. Would followup  with sleep medicine for options.  5. Ascending aortic aneurysm: Has history of dilated ascending  aorta in setting of bicuspid aortic valve.  Stable repaired aorta on CTA chest in 8/24.   Followup 3 months with me.   Marca Ancona 10/28/2022

## 2022-10-28 NOTE — Patient Instructions (Signed)
Routine lab work today. Will notify you of abnormal results  INCREASE Entresto to 49/51mg  twice daily  Follow up in 1 month with pharmacy  Follow up with Dr.McLean in 3 months  Do the following things EVERYDAY: Weigh yourself in the morning before breakfast. Write it down and keep it in a log. Take your medicines as prescribed Eat low salt foods--Limit salt (sodium) to 2000 mg per day.  Stay as active as you can everyday Limit all fluids for the day to less than 2 liters

## 2022-10-29 LAB — COMPREHENSIVE METABOLIC PANEL
ALT: 25 [IU]/L (ref 0–44)
AST: 27 [IU]/L (ref 0–40)
Albumin: 4.5 g/dL (ref 3.9–4.9)
Alkaline Phosphatase: 91 [IU]/L (ref 44–121)
BUN/Creatinine Ratio: 14 (ref 10–24)
BUN: 15 mg/dL (ref 8–27)
Bilirubin Total: 0.5 mg/dL (ref 0.0–1.2)
CO2: 18 mmol/L — ABNORMAL LOW (ref 20–29)
Calcium: 9.3 mg/dL (ref 8.6–10.2)
Chloride: 99 mmol/L (ref 96–106)
Creatinine, Ser: 1.08 mg/dL (ref 0.76–1.27)
Globulin, Total: 2.4 g/dL (ref 1.5–4.5)
Glucose: 106 mg/dL — ABNORMAL HIGH (ref 70–99)
Potassium: 4.7 mmol/L (ref 3.5–5.2)
Sodium: 136 mmol/L (ref 134–144)
Total Protein: 6.9 g/dL (ref 6.0–8.5)
eGFR: 74 mL/min/{1.73_m2} (ref >59)

## 2022-10-29 LAB — BRAIN NATRIURETIC PEPTIDE: BNP: 37.1 pg/mL (ref 0.0–100.0)

## 2022-11-07 ENCOUNTER — Encounter: Payer: Self-pay | Admitting: Family Medicine

## 2022-11-07 ENCOUNTER — Ambulatory Visit: Payer: Medicare PPO | Admitting: Family Medicine

## 2022-11-07 ENCOUNTER — Ambulatory Visit: Payer: Medicare PPO

## 2022-11-07 VITALS — BP 136/74 | HR 66 | Temp 98.2°F | Ht 71.0 in | Wt 230.0 lb

## 2022-11-07 DIAGNOSIS — Z7901 Long term (current) use of anticoagulants: Secondary | ICD-10-CM | POA: Diagnosis not present

## 2022-11-07 DIAGNOSIS — Z7984 Long term (current) use of oral hypoglycemic drugs: Secondary | ICD-10-CM | POA: Diagnosis not present

## 2022-11-07 DIAGNOSIS — E119 Type 2 diabetes mellitus without complications: Secondary | ICD-10-CM

## 2022-11-07 LAB — POCT GLYCOSYLATED HEMOGLOBIN (HGB A1C): Hemoglobin A1C: 6.3 % — AB (ref 4.0–5.6)

## 2022-11-07 LAB — POCT INR: INR: 2.2 (ref 2.0–3.0)

## 2022-11-07 MED ORDER — GLIPIZIDE 5 MG PO TABS: ORAL_TABLET | ORAL | Status: DC

## 2022-11-07 NOTE — Patient Instructions (Addendum)
Keep going as is.  If you have trouble with low sugars then let me know.   Plan on a yearly visit around February of 2025.   Update me as needed.

## 2022-11-07 NOTE — Patient Instructions (Addendum)
Pre visit review using our clinic review tool, if applicable. No additional management support is needed unless otherwise documented below in the visit note.  Increase dose today to take 1 1/2 tablets and then change weekly dose to take 1 tablet daily.  Recheck in 3 weeks.

## 2022-11-07 NOTE — Progress Notes (Signed)
Diabetes:  Using medications without difficulties:yes Hypoglycemic episodes:no Hyperglycemic episodes:no Feet problems:no Blood Sugars averaging: usually ~100-125 in the AMs.  eye exam within last year: yes A1c 6.3.  his sugar and BP improved in the meantime.   He is taking 1 glipizide if AM sugar is above 125 and 2 tabs if above 150.  Some days with no glipizide need/use.    He is going to see anticoagulation clinic on the way out.  No bleeding.  Still on coumadin.    Meds, vitals, and allergies reviewed.   ROS: Per HPI unless specifically indicated in ROS section   GEN: nad, alert and oriented HEENT: ncat NECK: supple w/o LA CV: rrr. Click noted at baseline.  PULM: ctab, no inc wob ABD: soft, +bs EXT: no edema SKIN: well perfused.

## 2022-11-07 NOTE — Progress Notes (Signed)
Pt also had apt with PCP today. Increase dose today to take 1 1/2 tablets and then change weekly dose to take 1 tablet daily. Recheck in 3 weeks.

## 2022-11-07 NOTE — Assessment & Plan Note (Signed)
Continue jardiance and metformin.  Continue work on diet and execise.  A1c 6.3.  His home sugar readings and BP improved in the meantime.   He is taking 1 glipizide if AM sugar is above 125 and 2 tabs if above 150.  Some days with no glipizide need/use.   Continue as is.  Recheck periodically.

## 2022-11-20 ENCOUNTER — Other Ambulatory Visit: Payer: Self-pay | Admitting: Cardiology

## 2022-11-25 NOTE — Progress Notes (Unsigned)
Advanced Heart Failure Clinic Note  PCP: Joaquim Nam, MD PCP-Cardiologist: Rollene Rotunda, MD HF-Cardiologist: Marca Ancona, MD  HPI:  72 y.o. with history of bicuspid aortic valve s/p mechanical AVR, paroxysmal atrial fibrillation, and chronic systolic CHF presents for evaluation of CHF.  In 06/2022, patient was admitted with atypical atrial flutter with RVR. Patient had St Jude mechanical AVR in 2011. Echo in 01/2022 showed EF 45-50%. Patient noted palpitations for about 1 week prior to admission. He checked his HR and found it to be in the 130s. He went to see his PCP and was in atrial fibrillation vs flutter with RVR and was sent to ER. In the ER, he was in what appeared to be atypical flutter with RVR. Amiodarone gtt was started. CXR showed evidence for CHF. Mildly elevated HS-TnI with no trend. Echo during this admission showed EF 25-30%, moderate LV dilation, mild RV dysfunction, mechanical aortic valve functioning normally.  He was cardioverted back to NSR.     Repeat echo in 08/2022 in NSR showed EF 30-35%, global hypokinesis, mild LVH, normal RV, mild MR, mechanical aortic valve functioning normally.    LHC/RHC in 09/2022 showed PCWP 5, CI 2.81; normal coronaries.    Seen by Dr. Shirlee Latch on 10/28/22. His atrial fibrillation ablation is scheduled for 01/2023. Entresto was increased to 49/51 mg BID.  Today [name] [name] returns to Heart Failure Clinic for pharmacist medication titration. Reports feeling good overall. Reports some increase in fatigue since stopping multivitamin. 3 pillows at night due to GERD. Denies dizziness lightheadedness chest pain palpitations SOB LEE orthostasis PND. Reports being able to complete all activities of daily living (ADLs). Is moderately active throughout the day. Walks 2 dogs every day for ~45 minutes. Checks BP at home. Ranges from 108-130 mmHg systolic pressure. Generally runs ~120s/60s. HR 50-70s. Weight at home is 124-128 pounds. Takes no  diuretic at home. Appetite is good. Does follow a low sodium diet.   Current Heart Failure Medications: Loop diuretic: none Beta-Blocker: carvedilol 3.125 mg BID ACEI/ARB/ARNI: Entresto 49/51 mg BID MRA: spironolactone 25 mg daily SGLT2i: Jardiance 10 mg daily   Has the patient been experiencing any side effects to the medications prescribed? No  Does the patient have any problems obtaining medications due to transportation or finances? No  Understanding of regimen: Good  Understanding of indications: Good  Potential of adherence: Good  Patient understands to avoid NSAIDs.  Patient understands to avoid decongestants.  Pertinent Lab Values: Creat  Date Value Ref Range Status  11/04/2011 1.09 0.50 - 1.35 mg/dL Final   Creatinine, Ser  Date Value Ref Range Status  10/28/2022 1.08 0.76 - 1.27 mg/dL Final   BUN  Date Value Ref Range Status  10/28/2022 15 8 - 27 mg/dL Final   Potassium  Date Value Ref Range Status  10/28/2022 4.7 3.5 - 5.2 mmol/L Final   Sodium  Date Value Ref Range Status  10/28/2022 136 134 - 144 mmol/L Final   B Natriuretic Peptide  Date Value Ref Range Status  09/20/2022 56.7 0.0 - 100.0 pg/mL Final    Comment:    Performed at [facility], [address] [place], [place] [address]   BNP  Date Value Ref Range Status  10/28/2022 37.1 0.0 - 100.0 pg/mL Final    Comment:    [employer] ADVIA Centaur XP methodology   Magnesium  Date Value Ref Range Status  07/22/2022 2.1 1.7 - 2.4 mg/dL Final    Comment:  Performed at [facility], [address] [place], [place] [address]   TSH  Date Value Ref Range Status  08/09/2022 2.833 0.350 - 4.500 uIU/mL Final    Comment:    Performed by a 3rd Generation assay with a functional sensitivity of <=0.01 uIU/mL. Performed at [facility], [address] [place], [place] [address]   12/27/2021 1.99 0.35 - 5.50 uIU/mL Final    Vital Signs: Today's Vitals   11/27/22  1054  BP: 138/68  Pulse: (!) 54  SpO2: 98%  Weight: 227 lb 9.6 oz (103.2 kg)   Assessment/Plan: 1. Atrial arrhythmias: Atypical atrial flutter at 06/2022 admission, had had atrial fibrillation peri-aortic valve surgery. Atrial flutter with RVR may have contributed to worsening of EF as a tachycardia-mediated CMP, but EF has not fully recovered in NSR.  He is now in NSR on amiodarone.  - Continue amiodarone 200 mg daily.  Check LFTs and TSH in 2 weeks with repeat BMP. He will need a regular eye exam.  - Continue warfarin with mechanical AoV, goal INR with atrial fibrillation has been 2.5-3.5.  - He has been seen by EP with plan for atrial fibrillation/flutter ablation scheduled in January. This would likely allow him to safely stop amiodarone.   2. Acute on chronic systolic CHF: Echo in 01/2022 with EF 45-50%.  Has baseline LBBB.  Echo in 06/2022 in setting of AFL with RVR with EF 25-30%, moderate LV dilation with global HK, moderate LVH, mildly decreased RV systolic function, normally functioning mechanical AoV. It is possible that drop in EF was tachycardia-mediated with atypical atrial flutter/RVR of uncertain duration.  However, EF did not fully recover on 08/2022 echo in NSR, which showed EF 30-35%, global hypokinesis, mild LVH, normal RV, mild MR, mechanical aortic valve functioning normally.  LHC/RHC in 09/2022 showed no significant coronary disease.  He remains in NSR.  He is not volume overloaded on exam, NYHA class I-II.  - Increase Entresto to 97/103 mg bid.  BMET today and BMET in 14 days.  - He does not need Lasix.  - Continue Coreg 3.125 mg bid, would not increase with bradycardia. - Continue Jardiance 10 mg daily.  - Continue spironolactone 25 mg daily. Potassium trending up last visit, BMET ordered today. Uses salt substitutes containing potassium. May need to stop these if potassium continues to increase with Entresto titration. - ECG last visit with LBBB of 150 msec.  Will repeat echo  in ~2-3 months per MD; if EF remains low, may benefit from CRT-D.    3. St Jude mechanical aortic valve: h/o bicuspid aortic valve with AS.  Valve functioned normally on 06/2022 echo.   - Continue warfarin, goal INR 2.5-3.5 given h/o atrial fibrillation.  Had to stop multivitamin containing fish oil do to interaction with INR. Advised to stay away from vitam K containing multivitamins and select OTC supplements (ginger, ginseng, ginko, chondroitin, etc)   4. OSA: has not tolerated CPAP. Would followup with sleep medicine for options.   5. Ascending aortic aneurysm: Has history of dilated ascending aorta in setting of bicuspid aortic valve.  Stable repaired aorta on CTA chest in 08/2022.   Follow up: Dr. Shirlee Latch in 2 months  Please do not hesitate to reach out with questions or concerns,  Enos Fling, PharmD, CPP, BCPS Heart Failure Pharmacist  Phone - [phone] 11/27/2022 11:24 AM

## 2022-11-27 ENCOUNTER — Ambulatory Visit: Payer: Medicare PPO | Attending: Cardiology | Admitting: Pharmacist

## 2022-11-27 VITALS — BP 138/68 | HR 54 | Wt 227.6 lb

## 2022-11-27 DIAGNOSIS — I509 Heart failure, unspecified: Secondary | ICD-10-CM

## 2022-11-27 MED ORDER — SACUBITRIL-VALSARTAN 97-103 MG PO TABS: 1.0000 | ORAL_TABLET | Freq: Two times a day (BID) | ORAL | 3 refills | Status: DC

## 2022-11-27 NOTE — Patient Instructions (Signed)
It was a pleasure seeing you today!  MEDICATIONS: -We are changing your medications today -Increase Entresto to 97/103 mg twice daily -Call if you have questions about your medications.  LABS: -Check labs today and again in 2 weeks.  NEXT APPOINTMENT: Return to clinic in 2 months with Dr. Shirlee Latch.  In general, to take care of your heart failure: -Limit your fluid intake to 2 Liters (half-gallon) per day.   -Limit your salt intake to ideally 2-3 grams (2000-3000 mg) per day. -Weigh yourself daily and record, and bring that "weight diary" to your next appointment.  (Weight gain of 2-3 pounds in 1 day typically means fluid weight.) -The medications for your heart are to help your heart and help you live longer.   -Please contact us before stopping any of your heart medications.  Call the clinic at [phone] with questions or to reschedule future appointments.

## 2022-11-28 ENCOUNTER — Other Ambulatory Visit: Payer: Self-pay | Admitting: Family Medicine

## 2022-11-28 ENCOUNTER — Ambulatory Visit: Payer: Medicare PPO

## 2022-11-28 DIAGNOSIS — Z7901 Long term (current) use of anticoagulants: Secondary | ICD-10-CM | POA: Diagnosis not present

## 2022-11-28 LAB — BASIC METABOLIC PANEL
BUN/Creatinine Ratio: 13 (ref 10–24)
BUN: 16 mg/dL (ref 8–27)
CO2: 20 mmol/L (ref 20–29)
Calcium: 9.3 mg/dL (ref 8.6–10.2)
Chloride: 105 mmol/L (ref 96–106)
Creatinine, Ser: 1.23 mg/dL (ref 0.76–1.27)
Glucose: 78 mg/dL (ref 70–99)
Potassium: 4.9 mmol/L (ref 3.5–5.2)
Sodium: 140 mmol/L (ref 134–144)
eGFR: 63 mL/min/{1.73_m2} (ref >59)

## 2022-11-28 LAB — POCT INR: INR: 2.7 (ref 2.0–3.0)

## 2022-11-28 NOTE — Progress Notes (Signed)
Ablation scheduled for 1/14. Continue 1 tablet daily. Recheck in 3 weeks.

## 2022-11-28 NOTE — Patient Instructions (Addendum)
Pre visit review using our clinic review tool, if applicable. No additional management support is needed unless otherwise documented below in the visit note.  Continue 1 tablet daily . Recheck in 3 weeks.

## 2022-12-19 ENCOUNTER — Telehealth: Payer: Self-pay

## 2022-12-19 ENCOUNTER — Ambulatory Visit: Payer: Medicare PPO

## 2022-12-19 NOTE — Telephone Encounter (Signed)
Pt missed coumadin clinic apt this morning. LVM to call to RS.

## 2022-12-20 NOTE — Telephone Encounter (Signed)
Patient's wife called, husband forgot appointment yesterday, please call to reschedule

## 2022-12-20 NOTE — Telephone Encounter (Signed)
Contacted pt, wife, [name] [name], and advised pt would have to have lab apt next week since clinic will be closed Thursday and Friday for the holiday. Made lab apt for 11/26 and advised coumadin clinic nurse will contact him that afternoon with dosing instructions. [name] verbalized understanding.

## 2022-12-24 ENCOUNTER — Other Ambulatory Visit: Payer: Medicare PPO

## 2022-12-24 ENCOUNTER — Ambulatory Visit (INDEPENDENT_AMBULATORY_CARE_PROVIDER_SITE_OTHER): Payer: Self-pay

## 2022-12-24 DIAGNOSIS — Z7901 Long term (current) use of anticoagulants: Secondary | ICD-10-CM

## 2022-12-24 LAB — POCT INR: INR: 3.2 — AB (ref 2.0–3.0)

## 2022-12-24 NOTE — Progress Notes (Addendum)
Pt went to [facility] lab today where POCT INR was performed and result was communicated to coumadin clinic. Continue 1 tablet daily. Recheck in 5 weeks.  LVM with dosing instructions and to return call to schedule next coumadin clinic apt.  Medical screening examination/treatment/procedure(s) were performed by non-physician practitioner and as supervising physician I was immediately available for consultation/collaboration.  I agree with above. Jacinta Shoe, MD

## 2022-12-24 NOTE — Patient Instructions (Addendum)
Pre visit review using our clinic review tool, if applicable. No additional management support is needed unless otherwise documented below in the visit note.  Continue 1 tablet daily. Recheck in 5 weeks.

## 2022-12-28 ENCOUNTER — Other Ambulatory Visit: Payer: Self-pay | Admitting: Cardiology

## 2023-01-06 ENCOUNTER — Other Ambulatory Visit: Payer: Self-pay | Admitting: Cardiology

## 2023-01-06 DIAGNOSIS — I420 Dilated cardiomyopathy: Secondary | ICD-10-CM

## 2023-01-12 NOTE — Progress Notes (Unsigned)
Cardiology Office Note:   Date:  01/14/2023  ID:  [name] [name], DOB April 06, 1950, MRN [id number] PCP: Joaquim Nam, MD  [facility] Providers Cardiologist:  Rollene Rotunda, MD Electrophysiologist:  Lanier Prude, MD {  History of Present Illness:   [name] [name] is a 72 y.o. male who presents for follow up after aortic valve replacement.   In June of this year he had a reduced EF compared with previous and I sent him for right and left heart Cath.  The valve function was normal but EF was now down to 25%.  Cath demonstrated minimal coronary disease.   Right heart pressures were normal.  He had persistent atrial fib thought to be related to the reduced EF.  He was cardioverted and is scheduled for ablation.  After cardioversion his ejection fraction was up to 35%.  He is maintaining sinus rhythm.  He is on max medical therapy.  He will not tolerate med titration with his bradycardia. The patient denies any new symptoms such as chest discomfort, neck or arm discomfort. There has been no new shortness of breath, PND or orthopnea. There have been no reported palpitations, presyncope or syncope.     ROS: As stated in the HPI and negative for all other systems.  Studies Reviewed:    EKG:   EKG Interpretation Date/Time:  Tuesday January 14 2023 14:20:50 EST Ventricular Rate:  52 PR Interval:  242 QRS Duration:  152 QT Interval:  496 QTC Calculation: 461 R Axis:   -59  Text Interpretation: Sinus bradycardia with 1st degree A-V block Left axis deviation Left bundle branch block When compared with ECG of 28-Oct-2022 09:44, No significant change was found Confirmed by Rollene Rotunda ([id number]) on 01/14/2023 2:28:12 PM     Risk Assessment/Calculations:    CHA2DS2-VASc Score = 3   This indicates a 3.2% annual risk of stroke. The patient's score is based upon: CHF History: 0 HTN History: 1 Diabetes History: 1 Stroke History: 0 Vascular Disease History: 0 Age Score:  1 Gender Score: 0  Physical Exam:   VS:  BP 136/62 (BP Location: Right Arm, Patient Position: Sitting, Cuff Size: Normal)   Pulse (!) 52   Ht 5\' 11"  (1.803 m)   Wt 230 lb (104.3 kg)   BMI 32.08 kg/m    Wt Readings from Last 3 Encounters:  01/14/23 230 lb (104.3 kg)  11/27/22 227 lb 9.6 oz (103.2 kg)  11/07/22 230 lb (104.3 kg)     GEN: Well nourished, well developed in no acute distress NECK: No JVD; No carotid bruits CARDIAC: RRR, mechanical S2, soft brief systolic murmurs, rubs, gallops RESPIRATORY:  Clear to auscultation without rales, wheezing or rhonchi  ABDOMEN: Soft, non-tender, non-distended EXTREMITIES:  No edema; No deformity   ASSESSMENT AND PLAN:   AVR:   He has normal valve function and understands endocarditis prophylaxis.  He is up-to-date with anticoagulation and tolerates this.    CARDIOMYOPATHY:   He will continue with current therapy.  I am going to have a repeat echo in January and if his ejection fraction is reduced plan is probably CRT.  Currently he seems to have class I symptoms.  OBESITY: I have multiple times encourage weight loss.  We talked about strategies.   HTN: The blood pressure is managed in the context of managing his cardiomyopathy.    DM: His A1c is down to 6.3 from 8.4 since starting Farxiga.  No change in therapy.    LBBB:  He is not having any symptoms with his bradycardia.  Management as above.  ATRIAL FIB:   He tolerates anticoagulation.  He is going for ablation.  I would hope to discontinue his amiodarone after this.       Follow up with me in March  Signed, Rollene Rotunda, MD

## 2023-01-13 NOTE — Telephone Encounter (Signed)
Contacted pt's wife, Fulton Mole, who reports pt is having an ablation on 02/11/23 and will need 4 weekly INR checks starting the week of 12/23. Advised with the holiday there will only be a coumadin clinic nurse available on 12/23 next week but will not be at the [facility] office. Advised pt will be placed on the [facility] lab schedule for POCT INR or lab INR if POCT INR is not available. Advised this nurse will f/u the afternoon of 10/23 with any dosing instructions. [name] reports pt is doing well. Advised if any changes to contact the coumadin clinic. [name] verbalized understanding.   Placed pt on [facility] lab schedule for 12/23.

## 2023-01-13 NOTE — Telephone Encounter (Signed)
Patient wife called in and stated that he needs to be scheduled for his coumadin check.

## 2023-01-14 ENCOUNTER — Ambulatory Visit: Payer: Medicare PPO | Attending: Cardiology | Admitting: Cardiology

## 2023-01-14 ENCOUNTER — Encounter: Payer: Self-pay | Admitting: Cardiology

## 2023-01-14 VITALS — BP 136/62 | HR 52 | Ht 71.0 in | Wt 230.0 lb

## 2023-01-14 DIAGNOSIS — E118 Type 2 diabetes mellitus with unspecified complications: Secondary | ICD-10-CM | POA: Diagnosis not present

## 2023-01-14 DIAGNOSIS — I10 Essential (primary) hypertension: Secondary | ICD-10-CM | POA: Diagnosis not present

## 2023-01-14 DIAGNOSIS — Z952 Presence of prosthetic heart valve: Secondary | ICD-10-CM | POA: Diagnosis not present

## 2023-01-14 DIAGNOSIS — I420 Dilated cardiomyopathy: Secondary | ICD-10-CM | POA: Diagnosis not present

## 2023-01-14 DIAGNOSIS — I4891 Unspecified atrial fibrillation: Secondary | ICD-10-CM

## 2023-01-14 NOTE — Patient Instructions (Signed)
Medication Instructions:  No changes.  *If you need a refill on your cardiac medications before your next appointment, please call your pharmacy*  Testing/Procedures: Your physician has requested that you have an echocardiogram in January 2025. Echocardiography is a painless test that uses sound waves to create images of your heart. It provides your doctor with information about the size and shape of your heart and how well your heart's chambers and valves are working. This procedure takes approximately one hour. There are no restrictions for this procedure. Please do NOT wear cologne, perfume, aftershave, or lotions (deodorant is allowed). Please arrive 15 minutes prior to your appointment time. [address].  Please note: We ask at that you not bring children with you during ultrasound (echo/ vascular) testing. Due to room size and safety concerns, children are not allowed in the ultrasound rooms during exams. Our front office staff cannot provide observation of children in our lobby area while testing is being conducted. An adult accompanying a patient to their appointment will only be allowed in the ultrasound room at the discretion of the ultrasound technician under special circumstances. We apologize for any inconvenience.    Follow-Up: At [facility], you and your health needs are our priority.  As part of our continuing mission to provide you with exceptional heart care, we have created designated Provider Care Teams.  These Care Teams include your primary Cardiologist (physician) and Advanced Practice Providers (APPs -  Physician Assistants and Nurse Practitioners) who all work together to provide you with the care you need, when you need it.  We recommend signing up for the patient portal called "MyChart".  Sign up information is provided on this After Visit Summary.  MyChart is used to connect with patients for Virtual Visits (Telemedicine).  Patients are able to view  lab/test results, encounter notes, upcoming appointments, etc.  Non-urgent messages can be sent to your provider as well.   To learn more about what you can do with MyChart, go to [email or web address].    Your next appointment:   3 month(s)  Provider:   Rollene Rotunda, MD

## 2023-01-17 ENCOUNTER — Telehealth: Payer: Self-pay

## 2023-01-17 NOTE — Telephone Encounter (Signed)
  Spoke with patient he is aware that the message is being forward to get him rescheduled     Copied from CRM [id number]. Topic: Appointments - Scheduling Inquiry for Clinic >> Jan 17, 2023  3:45 PM Sonny Dandy B wrote: Reason for CRM: pt called to speak with Nurse regarding his miss appt medicare aws. Stated he did not hear the phone and if possible can the nurse call him back. [phone]

## 2023-01-20 ENCOUNTER — Other Ambulatory Visit: Payer: Medicare PPO

## 2023-01-20 ENCOUNTER — Ambulatory Visit (INDEPENDENT_AMBULATORY_CARE_PROVIDER_SITE_OTHER): Payer: Self-pay

## 2023-01-20 DIAGNOSIS — Z7901 Long term (current) use of anticoagulants: Secondary | ICD-10-CM

## 2023-01-20 LAB — POCT INR: INR: 2.3 (ref 2.0–3.0)

## 2023-01-20 NOTE — Telephone Encounter (Signed)
Updating PCP concerning scheduled ablation and next INR test.

## 2023-01-20 NOTE — Patient Instructions (Addendum)
Pre visit review using our clinic review tool, if applicable. No additional management support is needed unless otherwise documented below in the visit note.  Increase dose today to take 1 1/2 tablets and then continue 1 tablet daily. Recheck in 1 weeks.

## 2023-01-20 NOTE — Progress Notes (Signed)
Pt went to [facility] lab today where POCT INR was performed and result was communicated to coumadin clinic. Pt scheduled for cardiac ablation for 1/14 and will need 4 weekly INR checks before procedure. This is the first week.  Increase dose today to take 1 1/2 tablets and then continue 1 tablet daily. Recheck in 1 weeks.  Contacted pt by phone and advised of dosing and recheck. Pt would like to have POCT INR at lab on 12/30 because he already has an apt on 1/2, and this is the day there is coumadin clinic at [facility]. Advised to contact cardiology and let them know of the result. Advised if anything changes to contact the coumadin clinic. Pt verbalized understanding.  Will f/u with pt on 12/30 with dosing instructions when result is received.

## 2023-01-24 ENCOUNTER — Ambulatory Visit (INDEPENDENT_AMBULATORY_CARE_PROVIDER_SITE_OTHER): Payer: Medicare PPO

## 2023-01-24 VITALS — Ht 71.0 in | Wt 230.0 lb

## 2023-01-24 DIAGNOSIS — Z0000 Encounter for general adult medical examination without abnormal findings: Secondary | ICD-10-CM

## 2023-01-24 NOTE — Telephone Encounter (Signed)
Noted. Thanks.

## 2023-01-24 NOTE — Progress Notes (Addendum)
Subjective:   [name] [name] is a 72 y.o. male who presents for Medicare Annual/Subsequent preventive examination.  Visit Complete: Virtual I connected with  [name] [name] on 01/24/23 by a audio enabled telemedicine application and verified that I am speaking with the correct person using two identifiers.  Patient Location: Home  Provider Location: Home Office  I discussed the limitations of evaluation and management by telemedicine. The patient expressed understanding and agreed to proceed.  Vital Signs: Because this visit was a virtual/telehealth visit, some criteria may be missing or patient reported. Any vitals not documented were not able to be obtained and vitals that have been documented are patient reported.  Patient Medicare AWV questionnaire was completed by the patient on (not done); I have confirmed that all information answered by patient is correct and no changes since this date.  Cardiac Risk Factors include: advanced age (>40men, >86 women);diabetes mellitus;dyslipidemia;hypertension;male gender;obesity (BMI >30kg/m2)     Objective:    Today's Vitals   01/24/23 0850  Weight: 230 lb (104.3 kg)  Height: 5\' 11"  (1.803 m)   Body mass index is 32.08 kg/m.     01/24/2023    9:03 AM 10/10/2022    8:57 AM 07/24/2022   12:30 PM 07/23/2022    8:00 AM 07/23/2022    3:02 AM 01/02/2022   10:22 AM 03/10/2021    5:33 PM  Advanced Directives  Does Patient Have a Medical Advance Directive? Yes Yes Yes No No Yes No  Type of Estate agent of [place];Living will Living will Healthcare Power of [name];Living will   Healthcare Power of [name];Living will   Does patient want to make changes to medical advance directive?  No - Patient declined No - Patient declined   No - Patient declined   Copy of Healthcare Power of Attorney in Chart? No - copy requested     Yes - validated most recent copy scanned in chart (See row information)   Would patient  like information on creating a medical advance directive?   No - Patient declined No - Patient declined   No - Patient declined    Current Medications (verified) Outpatient Encounter Medications as of 01/24/2023  Medication Sig   ACCU-CHEK AVIVA PLUS test strip USE AS DIRECTED TO CHECK BLOOD SUGAR TWO TO FOUR TIMES A DAY   Accu-Chek FastClix Lancets MISC USE AS DIRECTED TO CHECK BLOOD SUGAR TWICE DAILY   amiodarone (PACERONE) 200 MG tablet TAKE ONE TABLET (200 MG TOTAL) BY MOUTH DAILY.   azelastine (ASTELIN) 0.1 % nasal spray Place 2 sprays into both nostrils 2 (two) times daily as needed for rhinitis. Use in each nostril as directed   carvedilol (COREG) 3.125 MG tablet TAKE ONE TABLET (3.125 MG TOTAL) BY MOUTH TWO TIMES DAILY WITH A MEAL.   cetirizine (ZYRTEC) 10 MG tablet Take 10 mg by mouth daily.   empagliflozin (JARDIANCE) 10 MG TABS tablet TAKE ONE TABLET BY MOUTH DAILY BEFORE BREAKFAST   fluocinonide cream (LIDEX) 0.05 % APPLY 1 APPLICATION TO AFFECTEDE AREAS 2 TIMES DAILY AS NEEDED   glipiZIDE (GLUCOTROL) 5 MG tablet 1 tab if AM sugar is above 125, 2 tabs if AM sugar is above 150.   Lancets Misc. (ACCU-CHEK FASTCLIX LANCET) KIT AS DIRECTED   metFORMIN (GLUCOPHAGE) 500 MG tablet Take 2 tablets (1,000 mg total) by mouth 2 (two) times daily.   omeprazole (PRILOSEC) 20 MG capsule TAKE ONE CAPSULE BY MOUTH ONCE DAILY   pravastatin (PRAVACHOL) 20 MG  tablet TAKE ONE TABLET BY MOUTH ONCE A DAY   psyllium (METAMUCIL) 58.6 % packet Take 1 packet by mouth daily.   sacubitril-valsartan (ENTRESTO) 97-103 MG Take 1 tablet by mouth 2 (two) times daily.   spironolactone (ALDACTONE) 25 MG tablet Take 1 tablet (25 mg total) by mouth daily.   tamsulosin (FLOMAX) 0.4 MG CAPS capsule TAKE ONE CAPSULE BY MOUTH ONCE DAILY   warfarin (COUMADIN) 3 MG tablet TAKE ONE TABLET BY MOUTH DAILY EXCEPT TAKE 1/2 TABLET ON WEDNESDAY OR AS DIRECTED BY ANTICOAGULATION CLINIC (Patient taking differently: Take 3 mg by mouth  daily.)   No facility-administered encounter medications on file as of 01/24/2023.    Allergies (verified) Ace inhibitors, Aspirin, Other, Primaxin [imipenem w/cilastatin sodium], and Tape   History: Past Medical History:  Diagnosis Date   Anemia    Aortic insufficiency    with bicuspid aortic valve (the last MRI demonstrated the aortic root to be 4.8 x 4.7 cm.) Mod regurgitation. Normal chamber size with very mild left ventricular dysfunction.)   Blood transfusion    CHF (congestive heart failure) (HCC)    Clotting disorder (HCC)    Cough 06/28/2009   Dr. 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   Pericardial effusion 07/2009   admission to [facility], s/p pericardial window   Pleural effusion 10/2009   s/p thoracentesis   PNA (pneumonia) 10/2009   admnission to [facility], with right pleural effusion, s/p thoracentesis   Sleep apnea    Syncope 08/2009   admission for fever and syncope, no source for fever seen on cultures. 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 small/apical infarct EF 50%   CARDIOVERSION N/A 07/24/2022   Procedure: CARDIOVERSION;  Surgeon: Yvonne Kendall, MD;  Location: [facility] ORS;  Service: Cardiovascular;  Laterality: N/A;   DOPPLER ECHOCARDIOGRAPHY  01/18/97   LVH, mild dilated aorta - root mod to severe aortic regurg EF 50-55%   DOPPLER ECHOCARDIOGRAPHY  09/27/03   No changes, moderate severe aortic regurg   DOPPLER ECHOCARDIOGRAPHY  07/09/2004   Mod severe aortic regurg  2-3+ T.R., T.I.R., mild P..R.   DOPPLER ECHOCARDIOGRAPHY  04/01/06   Hypokin Apex EF 55%   DOPPLER ECHOCARDIOGRAPHY  02/15/2010   Mild LVH, mild decr Sys fctn EF 40-45% Mild Dias Dysfctn Mech AV Triv AR, MR   ESOPHAGOGASTRODUODENOSCOPY  1990, 1999   Hot dog in throat (1987)/ Chicken in throat (1990)/ 01/06/97 (Dr. Kinnie Scales) Impaction, dilatation, Schatzki's Ring/ 07/1998 repeat Dilatation/ 01/10/01 stricture/dilated/ 03/08/05 Schatzki's Ring, dilated/ 06/01/08 Food Disimpaction [name] Bulb Ulcer (Dr. Ewing Schlein)   ESOPHAGOGASTRODUODENOSCOPY  01/06/1997   (Dr. Kinnie Scales) Impaction, dilatation, Schatzki's Ring   ESOPHAGOGASTRODUODENOSCOPY  08/09/98   Repeat dilatation [name])   ESOPHAGOGASTRODUODENOSCOPY  01/13/2001   stricture/ HH, duodenal ulcer / bleeding Marina Goodell)   ESOPHAGOGASTRODUODENOSCOPY  12/25/01   esophagus stricture, dilated   ESOPHAGOGASTRODUODENOSCOPY  03/23/2001   stricture, dilated   ESOPHAGOGASTRODUODENOSCOPY  03/08/05   Schatzki's Ring  03/13/05  Schatzki's Ring - dilated, duodenitis   ESOPHAGOGASTRODUODENOSCOPY  06/11/08   with food disimpaction.  Food impaction Ms HH  Sm bulb ulcer (Dr. Ewing Schlein)   ESOPHAGOGASTRODUODENOSCOPY     stricture/HH, duodenal ulcer/bleeding Marina Goodell)   ESOPHAGOGASTRODUODENOSCOPY N/A 01/23/2016   Procedure: ESOPHAGOGASTRODUODENOSCOPY (EGD);  Surgeon: Midge Minium, MD;  Location: [facility] ENDOSCOPY;  Service: Endoscopy;  Laterality: N/A;   ESOPHAGOGASTRODUODENOSCOPY N/A 03/10/2021   Procedure: ESOPHAGOGASTRODUODENOSCOPY (EGD);  Surgeon: Jenel Lucks, MD;  Location: [facility];  Service: Gastroenterology;  Laterality: N/A;   ESOPHAGOGASTRODUODENOSCOPY (EGD) WITH PROPOFOL N/A 02/06/2016   Procedure: ESOPHAGOGASTRODUODENOSCOPY (EGD) WITH PROPOFOL;  Surgeon: Midge Minium, MD;  Location: [facility] ENDOSCOPY;  Service: Endoscopy;  Laterality: N/A;   FOREIGN BODY REMOVAL  03/10/2021   Procedure: FOREIGN BODY REMOVAL;  Surgeon: Jenel Lucks, MD;  Location: [facility] ENDOSCOPY;   Service: Gastroenterology;;   HERNIA REPAIR  2011   abdominal; post AVR   MRI  2/05   LVH global hypokin EF 48% Mod A.I.   RIGHT/LEFT HEART CATH AND CORONARY ANGIOGRAPHY Bilateral 10/10/2022   Procedure: RIGHT/LEFT HEART CATH AND CORONARY ANGIOGRAPHY;  Surgeon: Laurey Morale, MD;  Location: [facility] INVASIVE CV LAB;  Service: Cardiovascular;  Laterality: Bilateral;   VALVE REPLACEMENT  06/2009   St. Jude mechanical valve per Dr. Laneta Simmers   Family History  Problem Relation Age of Onset   Obesity Mother        bedridden   Hypertension Mother    Heart disease Father        CABG, CAD   Hypertension Father    Lymphoma Father    Cancer Father        lymphoma   Alcohol abuse Brother    Cancer Brother        melanoma   Colon cancer Paternal Aunt    Stomach cancer Neg Hx    Rectal cancer Neg Hx    Prostate cancer Neg Hx    Social History   Socioeconomic History   Marital status: Married    Spouse name: Not on file   Number of children: 2   Years of education: Not on file   Highest education level: Not on file  Occupational History   Occupation: mech. supervisor    Employer: [employer]    Comment: [place]  Tobacco Use   Smoking status: Never   Smokeless tobacco: Never  Vaping Use   Vaping status: Never Used  Substance and Sexual Activity   Alcohol use: No    Alcohol/week: 0.0 standard drinks of alcohol   Drug use: No   Sexual activity: Not on file  Other Topics Concern   Not on file  Social History Narrative   Married 1996   Social Drivers of Health   Financial Resource Strain: Low Risk  (01/24/2023)   Overall Financial Resource Strain (CARDIA)    Difficulty of Paying Living Expenses: Not hard at all  Food Insecurity: No Food Insecurity (01/24/2023)   Hunger Vital Sign    Worried About Running Out of Food in the Last Year: Never true    Ran Out of Food in the Last Year: Never true  Transportation Needs: No Transportation Needs (01/24/2023)   PRAPARE -  Administrator, Civil Service (Medical): No    Lack of Transportation (Non-Medical): No  Physical Activity: Sufficiently Active (01/24/2023)   Exercise Vital Sign    Days of Exercise per Week: 6 days    Minutes of Exercise per Session: 30 min  Stress: No Stress Concern Present (01/24/2023)   [employer] of Occupational Health - Occupational Stress Questionnaire    Feeling of Stress : Not at all  Social Connections: Moderately Integrated (01/24/2023)   Social Connection and Isolation Panel [NHANES]    Frequency of Communication with Friends and Family: Three times a week    Frequency of Social Gatherings with Friends and Family: Twice a week    Attends Religious Services: More than 4 times per year    Active Member of [employer] or Organizations: No    Attends Engineer, structural: Never    Marital Status: Married    Tobacco Counseling Counseling given: Not Answered   Clinical Intake:  Pre-visit preparation completed: No  Pain : No/denies pain    BMI - recorded: 32.08 Nutritional Status: BMI > 30  Obese Nutritional Risks: None Diabetes: Yes CBG done?: Yes (BS 110 this am at home) CBG resulted in Enter/ Edit results?: No Did pt. bring in CBG monitor from home?: No  How often do you need to have someone help you when you read instructions, pamphlets, or other written materials from your doctor or pharmacy?: 1 - Never  Interpreter Needed?: No  Comments: lives with wife, grandson, and brother in law all at home Information entered by :: B.Aayliah Rotenberry,LPN   Activities of Daily Living    01/24/2023    9:03 AM 10/10/2022    8:56 AM  In your present state of health, do you have any difficulty performing the following activities:  Hearing? 1 1  Vision? 0 0  Difficulty concentrating or making decisions? 0 0  Walking or climbing stairs? 0 0  Dressing or bathing? 0 0  Doing errands, shopping? 0   Preparing Food and eating ? N   Using the Toilet? N   In  the past six months, have you accidently leaked urine? Y   Do you have problems with loss of bowel control? N   Managing your Medications? N   Managing your Finances? N   Housekeeping or managing your Housekeeping? N     Patient Care Team: Joaquim Nam, MD as PCP - General (Family Medicine) Rollene Rotunda, MD as PCP - Cardiology (Cardiology) Lanier Prude, MD as PCP - Electrophysiology (Cardiology) Harriette Bouillon, MD as Consulting Physician (General Surgery) Blair Promise, OD (Optometry) Evans, Garry Heater, [facility] (Inactive) as Pharmacist (Pharmacist)  Indicate any recent Medical Services you may have received from other than [facility] providers in the past year (date may be approximate).     Assessment:   This is a routine wellness examination for Alexandro.  Hearing/Vision screen Hearing Screening - Comments:: Pt says he wears hearing aids that are not adequate:needs more Vision Screening - Comments:: Pt says he wears glasses for driving:vision is good Dr Dion Body   Goals Addressed             This Visit's Progress    Increase physical activity   Not on track    Water aerobics-will continue this goal     Increase water intake   Not on track    Starting 12/10/2017, I will attempt to drink at least 6-8 glasses of water daily.  Heart MD told pt to limit to 2L per day.     COMPLETED: Patient Stated   On track    12/16/2018, I will maintain and continue medications as prescribed.        Depression Screen    01/24/2023    8:59 AM 11/07/2022  8:38 AM 07/30/2022   10:38 AM 04/04/2022    9:09 AM 01/02/2022   10:21 AM 01/01/2021   11:05 AM 12/27/2019    9:00 AM  PHQ 2/9 Scores  PHQ - 2 Score 0 0 0 0 0 0 0  PHQ- 9 Score  4 1 9        Fall Risk    01/24/2023    8:55 AM 11/07/2022    8:37 AM 07/30/2022   10:38 AM 04/04/2022    9:09 AM 01/01/2021   11:05 AM  Fall Risk   Falls in the past year? 1 0 0 1 0  Number falls in past yr: 1 0 0 0 0  Injury with Fall? 0  0 0 0 0  Risk for fall due to : No Fall Risks No Fall Risks No Fall Risks No Fall Risks No Fall Risks  Follow up Education provided;Falls prevention discussed Falls evaluation completed Falls evaluation completed Falls evaluation completed Falls evaluation completed    MEDICARE RISK AT HOME: Medicare Risk at Home Any stairs in or around the home?: Yes If so, are there any without handrails?: Yes Home free of loose throw rugs in walkways, pet beds, electrical cords, etc?: Yes Adequate lighting in your home to reduce risk of falls?: Yes Life alert?: No Use of a cane, walker or w/c?: No Grab bars in the bathroom?: Yes Shower chair or bench in shower?: Yes Elevated toilet seat or a handicapped toilet?: Yes  TIMED UP AND GO:  Was the test performed?  No    Cognitive Function:    12/16/2018    3:49 PM 12/10/2017    9:04 AM 12/02/2016    1:41 PM  MMSE - Mini Mental State Exam  Orientation to time 5 5 5   Orientation to Place 5 5 5   Registration 3 3 3   Attention/ Calculation 5 0 0  Recall 3 3 3   Language- name 2 objects  0 0  Language- repeat 1 1 1   Language- follow 3 step command  3 3  Language- read & follow direction  0 0  Write a sentence  0 0  Copy design  0 0  Total score  20 20        01/24/2023    9:04 AM  6CIT Screen  What Year? 0 points  What month? 0 points  What time? 0 points  Count back from 20 0 points  Months in reverse 0 points  Repeat phrase 0 points  Total Score 0 points    Immunizations Immunization History  Administered Date(s) Administered   Fluad Quad(high Dose 65+) 11/19/2018, 12/27/2019, 09/29/2020, 10/04/2021   Influenza Split 10/25/2010, 11/28/2011   Influenza Whole 10/28/2009   Influenza, High Dose Seasonal PF 09/10/2022   Influenza,inj,Quad PF,6+ Mos 11/23/2012, 11/01/2013, 11/16/2014, 12/01/2015, 12/03/2016, 12/10/2017   PFIZER(Purple Top)SARS-COV-2 Vaccination 04/02/2019, 04/23/2019, 02/08/2020   Pfizer Covid-19 Vaccine Bivalent  Booster 76yrs & up 10/10/2020   Pfizer(Comirnaty)Fall Seasonal Vaccine 12 years and older 01/02/2022, 09/10/2022   Pneumococcal Conjugate-13 06/02/2017   Pneumococcal Polysaccharide-23 01/28/2009, 12/22/2018   Rsv, Bivalent, Protein Subunit Rsvpref,pf Verdis Frederickson) 01/02/2022   Td 11/21/1997   Tdap 04/01/2013   Zoster Recombinant(Shingrix) 01/04/2019, 02/22/2019   Zoster, Live 05/16/2014    TDAP status: Up to date  Flu Vaccine status: Up to date  Pneumococcal vaccine status: Up to date  Covid-19 vaccine status: Completed vaccines  Qualifies for Shingles Vaccine? Yes   Zostavax completed Yes   Shingrix Completed?: Yes  Screening Tests  Health Maintenance  Topic Date Due   Diabetic kidney evaluation - Urine ACR  01/25/2023 (Originally 01/04/2023)   FOOT EXAM  03/28/2023 (Originally 01/04/2023)   Colonoscopy  01/24/2024 (Originally 07/10/2021)   COVID-19 Vaccine (7 - 2024-25 season) 11/23/2027 (Originally 11/05/2022)   DTaP/Tdap/Td (3 - Td or Tdap) 04/02/2023   HEMOGLOBIN A1C  05/08/2023   OPHTHALMOLOGY EXAM  07/22/2023   Diabetic kidney evaluation - eGFR measurement  11/27/2023   Medicare Annual Wellness (AWV)  01/24/2024   Pneumonia Vaccine 63+ Years old  Completed   INFLUENZA VACCINE  Completed   Hepatitis C Screening  Completed   Zoster Vaccines- Shingrix  Completed   HPV VACCINES  Aged Out    Health Maintenance  There are no preventive care reminders to display for this patient.   Colorectal cancer screening: Type of screening: Cologuard. Completed 06/2022. Repeat every 3 years  Lung Cancer Screening: (Low Dose CT Chest recommended if Age 73-80 years, 20 pack-year currently smoking OR have quit w/in 15years.) does not qualify.   Lung Cancer Screening Referral: njo  Additional Screening:  Hepatitis C Screening: does not qualify; Completed 08/02/09  Vision Screening: Recommended annual ophthalmology exams for early detection of glaucoma and other disorders of the eye. Is  the patient up to date with their annual eye exam?  Yes  Who is the provider or what is the name of the office in which the patient attends annual eye exams? Dr Dion Body If pt is not established with a provider, would they like to be referred to a provider to establish care? No .   Dental Screening: Recommended annual dental exams for proper oral hygiene  Diabetic Foot Exam: Diabetic Foot Exam: Overdue, Pt has been advised about the importance in completing this exam. Pt is scheduled for diabetic foot exam on next PCP visit in Feb 2025.  Community Resource Referral / Chronic Care Management: CRR required this visit?  No   CCM required this visit?  No    Plan:     I have personally reviewed and noted the following in the patient's chart:   Medical and social history Use of alcohol, tobacco or illicit drugs  Current medications and supplements including opioid prescriptions. Patient is not currently taking opioid prescriptions. Functional ability and status Nutritional status Physical activity Advanced directives List of other physicians Hospitalizations, surgeries, and ER visits in previous 12 months Vitals Screenings to include cognitive, depression, and falls Referrals and appointments  In addition, I have reviewed and discussed with patient certain preventive protocols, quality metrics, and best practice recommendations. A written personalized care plan for preventive services as well as general preventive health recommendations were provided to patient.    Sue Lush, LPN   [id number]/08/[id number]   After Visit Summary: (MyChart) Due to this being a telephonic visit, the after visit summary with patients personalized plan was offered to patient via MyChart   Nurse Notes: The patient states he is doing alright. He relays he needs a medication stronger than Flomax (or increase dosage) to control his bladder leaks.has  He has no questions at this time.

## 2023-01-24 NOTE — Patient Instructions (Signed)
Mr. Laurich , Thank you for taking time to come for your Medicare Wellness Visit. I appreciate your ongoing commitment to your health goals. Please review the following plan we discussed and let me know if I can assist you in the future.   Referrals/Orders/Follow-Ups/Clinician Recommendations: none  This is a list of the screening recommended for you and due dates:  Health Maintenance  Topic Date Due   Colon Cancer Screening  07/10/2021   Yearly kidney health urinalysis for diabetes  01/04/2023   Complete foot exam   01/04/2023   COVID-19 Vaccine (7 - 2024-25 season) 11/23/2027*   DTaP/Tdap/Td vaccine (3 - Td or Tdap) 04/02/2023   Hemoglobin A1C  05/08/2023   Eye exam for diabetics  07/22/2023   Yearly kidney function blood test for diabetes  11/27/2023   Medicare Annual Wellness Visit  01/24/2024   Pneumonia Vaccine  Completed   Flu Shot  Completed   Hepatitis C Screening  Completed   Zoster (Shingles) Vaccine  Completed   HPV Vaccine  Aged Out  *Topic was postponed. The date shown is not the original due date.    Advanced directives: (Copy Requested) Please bring a copy of your health care power of attorney and living will to the office to be added to your chart at your convenience.  Next Medicare Annual Wellness Visit scheduled for next year: Yes 01/26/24 @ 10:10am televisit

## 2023-01-27 ENCOUNTER — Ambulatory Visit: Payer: Self-pay

## 2023-01-27 ENCOUNTER — Other Ambulatory Visit (INDEPENDENT_AMBULATORY_CARE_PROVIDER_SITE_OTHER): Payer: Medicare PPO

## 2023-01-27 DIAGNOSIS — I509 Heart failure, unspecified: Secondary | ICD-10-CM | POA: Diagnosis not present

## 2023-01-27 DIAGNOSIS — Z7901 Long term (current) use of anticoagulants: Secondary | ICD-10-CM

## 2023-01-27 LAB — POCT INR: INR: 3 (ref 2.0–3.0)

## 2023-01-27 NOTE — Progress Notes (Signed)
Pt went to [facility] lab today where POCT INR was performed and result was communicated to coumadin clinic. Pt scheduled for cardiac ablation for 1/14 and will need 4 weekly INR checks before procedure. This is the second week.  Continue 1 tablet daily. Recheck in 1 week.  LVM with dosing and that this nurse will f/u with him at the end of the week to schedule next INR check.Advised if anything changes to contact the coumadin clinic.

## 2023-01-27 NOTE — Patient Instructions (Addendum)
Pre visit review using our clinic review tool, if applicable. No additional management support is needed unless otherwise documented below in the visit note.  Continue 1 tablet daily. Recheck in 1 week.

## 2023-01-27 NOTE — Addendum Note (Signed)
Addended by: Lorelle Formosa E on: 01/27/2023 10:14 AM   Modules accepted: Orders

## 2023-01-28 LAB — BASIC METABOLIC PANEL
BUN/Creatinine Ratio: 16 (ref 10–24)
BUN: 20 mg/dL (ref 8–27)
CO2: 19 mmol/L — ABNORMAL LOW (ref 20–29)
Calcium: 9.3 mg/dL (ref 8.6–10.2)
Chloride: 105 mmol/L (ref 96–106)
Creatinine, Ser: 1.22 mg/dL (ref 0.76–1.27)
Glucose: 126 mg/dL — ABNORMAL HIGH (ref 70–99)
Potassium: 5.1 mmol/L (ref 3.5–5.2)
Sodium: 142 mmol/L (ref 134–144)
eGFR: 64 mL/min/{1.73_m2} (ref >59)

## 2023-01-28 LAB — HEPATIC FUNCTION PANEL
ALT: 22 [IU]/L (ref 0–44)
AST: 30 [IU]/L (ref 0–40)
Albumin: 4.5 g/dL (ref 3.9–4.9)
Alkaline Phosphatase: 86 [IU]/L (ref 44–121)
Bilirubin Total: 0.6 mg/dL (ref 0.0–1.2)
Bilirubin, Direct: 0.24 mg/dL (ref 0.00–0.40)
Total Protein: 6.8 g/dL (ref 6.0–8.5)

## 2023-01-28 LAB — TSH: TSH: 2.7 u[IU]/mL (ref 0.450–4.500)

## 2023-01-30 ENCOUNTER — Encounter (HOSPITAL_COMMUNITY): Payer: Self-pay

## 2023-01-30 ENCOUNTER — Ambulatory Visit (HOSPITAL_COMMUNITY)
Admission: RE | Admit: 2023-01-30 | Discharge: 2023-01-30 | Disposition: A | Discharge status: Home / Self Care | Payer: Medicare PPO | Source: Ambulatory Visit | Attending: Cardiology | Admitting: Cardiology

## 2023-01-30 DIAGNOSIS — I4819 Other persistent atrial fibrillation: Secondary | ICD-10-CM | POA: Diagnosis not present

## 2023-01-30 DIAGNOSIS — Z79899 Other long term (current) drug therapy: Secondary | ICD-10-CM | POA: Insufficient documentation

## 2023-01-30 DIAGNOSIS — I4892 Unspecified atrial flutter: Secondary | ICD-10-CM | POA: Diagnosis not present

## 2023-01-30 DIAGNOSIS — I5020 Unspecified systolic (congestive) heart failure: Secondary | ICD-10-CM | POA: Insufficient documentation

## 2023-01-30 MED ORDER — IOHEXOL 350 MG/ML SOLN
95.0000 mL | Freq: Once | INTRAVENOUS | Status: AC | PRN
  Administered 2023-01-30: 95 mL via INTRAVENOUS

## 2023-02-03 ENCOUNTER — Ambulatory Visit (INDEPENDENT_AMBULATORY_CARE_PROVIDER_SITE_OTHER): Payer: Self-pay

## 2023-02-03 ENCOUNTER — Other Ambulatory Visit: Payer: Medicare PPO

## 2023-02-03 DIAGNOSIS — Z7901 Long term (current) use of anticoagulants: Secondary | ICD-10-CM

## 2023-02-03 LAB — POCT INR: INR: 3 (ref 2.0–3.0)

## 2023-02-03 NOTE — Progress Notes (Signed)
 Pt went to [facility] lab today where POCT INR was performed and result was communicated to coumadin  clinic. Pt scheduled for cardiac ablation for 1/14 and will need 4 weekly INR checks before procedure. This is the third week. Pt will receive INR testing the day of the procedure, which will be his fourth reading.  Continue 1 tablet daily. Recheck after ablation depending on what final INR is before procedure..  Advised of dosing and that this nurse will f/u with him after the procedure to schedule next INR check.Advised if anything changes to contact the coumadin  clinic. Advised a msg will be sent to cardiology to let them know the 3 weeks of INR testing has been performed. Pt verbalized understanding.  Sent staff msg to RN and cardiologist performing ablation concerning INR results.

## 2023-02-03 NOTE — Patient Instructions (Addendum)
 Pre visit review using our clinic review tool, if applicable. No additional management support is needed unless otherwise documented below in the visit note.  Continue 1 tablet daily. Recheck after ablation depending on what final INR is before procedure

## 2023-02-10 ENCOUNTER — Other Ambulatory Visit: Payer: Self-pay | Admitting: Family Medicine

## 2023-02-10 MED ORDER — TAMSULOSIN HCL 0.4 MG PO CAPS: 0.8000 mg | ORAL_CAPSULE | Freq: Every day | ORAL | 3 refills | Status: DC

## 2023-02-10 NOTE — Pre-Procedure Instructions (Signed)
 Attempted to call patient regarding procedure instructions.  Left voicemail on the following items: Arrival time 0800 Nothing to eat or drink after midnight No meds AM of procedure Responsible person to drive you home and stay with you for 24 hrs  Have you missed any doses of anti-coagulant Coumadin - should be taken once a day.  If you have missed any doses please let us  know.

## 2023-02-10 NOTE — Progress Notes (Signed)
 Please update patient.  I sent rx for higher dose of flomax (2 x 0.4mg , taken at the same time).  Let me know if that isn't helping with urinary symptoms.  Caution re: lightheadedness.  Update me as needed.  Thanks.

## 2023-02-11 ENCOUNTER — Other Ambulatory Visit (HOSPITAL_COMMUNITY): Payer: Self-pay

## 2023-02-11 ENCOUNTER — Other Ambulatory Visit: Payer: Self-pay

## 2023-02-11 ENCOUNTER — Ambulatory Visit (HOSPITAL_COMMUNITY)
Admission: RE | Admit: 2023-02-11 | Discharge: 2023-02-11 | Disposition: A | Discharge status: Home / Self Care | Payer: Medicare PPO | Attending: Cardiology | Admitting: Cardiology

## 2023-02-11 ENCOUNTER — Encounter (HOSPITAL_COMMUNITY): Payer: Self-pay | Admitting: Cardiology

## 2023-02-11 ENCOUNTER — Ambulatory Visit (HOSPITAL_COMMUNITY)
Admission: RE | Disposition: A | Discharge status: Home / Self Care | Payer: Medicare PPO | Source: Home / Self Care | Attending: Cardiology

## 2023-02-11 ENCOUNTER — Ambulatory Visit (HOSPITAL_BASED_OUTPATIENT_CLINIC_OR_DEPARTMENT_OTHER): Payer: Medicare PPO | Admitting: Certified Registered Nurse Anesthetist

## 2023-02-11 ENCOUNTER — Ambulatory Visit (HOSPITAL_COMMUNITY): Payer: Medicare PPO | Admitting: Certified Registered Nurse Anesthetist

## 2023-02-11 DIAGNOSIS — I5022 Chronic systolic (congestive) heart failure: Secondary | ICD-10-CM | POA: Insufficient documentation

## 2023-02-11 DIAGNOSIS — I484 Atypical atrial flutter: Secondary | ICD-10-CM | POA: Insufficient documentation

## 2023-02-11 DIAGNOSIS — I509 Heart failure, unspecified: Secondary | ICD-10-CM | POA: Diagnosis not present

## 2023-02-11 DIAGNOSIS — Z952 Presence of prosthetic heart valve: Secondary | ICD-10-CM | POA: Diagnosis not present

## 2023-02-11 DIAGNOSIS — G4730 Sleep apnea, unspecified: Secondary | ICD-10-CM | POA: Diagnosis not present

## 2023-02-11 DIAGNOSIS — E119 Type 2 diabetes mellitus without complications: Secondary | ICD-10-CM | POA: Insufficient documentation

## 2023-02-11 DIAGNOSIS — I4819 Other persistent atrial fibrillation: Secondary | ICD-10-CM | POA: Insufficient documentation

## 2023-02-11 DIAGNOSIS — E785 Hyperlipidemia, unspecified: Secondary | ICD-10-CM

## 2023-02-11 DIAGNOSIS — I4891 Unspecified atrial fibrillation: Secondary | ICD-10-CM | POA: Diagnosis not present

## 2023-02-11 DIAGNOSIS — Z7984 Long term (current) use of oral hypoglycemic drugs: Secondary | ICD-10-CM | POA: Insufficient documentation

## 2023-02-11 DIAGNOSIS — Z7901 Long term (current) use of anticoagulants: Secondary | ICD-10-CM | POA: Insufficient documentation

## 2023-02-11 DIAGNOSIS — I110 Hypertensive heart disease with heart failure: Secondary | ICD-10-CM | POA: Insufficient documentation

## 2023-02-11 DIAGNOSIS — I447 Left bundle-branch block, unspecified: Secondary | ICD-10-CM | POA: Diagnosis not present

## 2023-02-11 HISTORY — PX: ATRIAL FIBRILLATION ABLATION: EP1191

## 2023-02-11 LAB — CBC
HCT: 36.9 % — ABNORMAL LOW (ref 39.0–52.0)
Hemoglobin: 12.8 g/dL — ABNORMAL LOW (ref 13.0–17.0)
MCH: 33.2 pg (ref 26.0–34.0)
MCHC: 34.7 g/dL (ref 30.0–36.0)
MCV: 95.6 fL (ref 80.0–100.0)
Platelets: 200 10*3/uL (ref 150–400)
RBC: 3.86 MIL/uL — ABNORMAL LOW (ref 4.22–5.81)
RDW: 14.1 % (ref 11.5–15.5)
WBC: 4.1 10*3/uL (ref 4.0–10.5)
nRBC: 0 % (ref 0.0–0.2)

## 2023-02-11 LAB — GLUCOSE, CAPILLARY
Glucose-Capillary: 126 mg/dL — ABNORMAL HIGH (ref 70–99)
Glucose-Capillary: 85 mg/dL (ref 70–99)

## 2023-02-11 LAB — PROTIME-INR
INR: 2.5 — ABNORMAL HIGH (ref 0.8–1.2)
Prothrombin Time: 27.4 s — ABNORMAL HIGH (ref 11.4–15.2)

## 2023-02-11 SURGERY — ATRIAL FIBRILLATION ABLATION
Anesthesia: General

## 2023-02-11 MED ORDER — SODIUM CHLORIDE 0.9% FLUSH
3.0000 mL | Freq: Two times a day (BID) | INTRAVENOUS | Status: DC
Start: 2023-02-11 — End: 2023-02-11

## 2023-02-11 MED ORDER — PHENYLEPHRINE HCL-NACL 20-0.9 MG/250ML-% IV SOLN
INTRAVENOUS | Status: DC | PRN
  Administered 2023-02-11: 40 ug/min via INTRAVENOUS

## 2023-02-11 MED ORDER — HEPARIN (PORCINE) IN NACL 1000-0.9 UT/500ML-% IV SOLN
INTRAVENOUS | Status: DC | PRN
  Administered 2023-02-11 (×3): 500 mL

## 2023-02-11 MED ORDER — EPHEDRINE SULFATE-NACL 50-0.9 MG/10ML-% IV SOSY
PREFILLED_SYRINGE | INTRAVENOUS | Status: DC | PRN
  Administered 2023-02-11 (×3): 5 mg via INTRAVENOUS

## 2023-02-11 MED ORDER — SUGAMMADEX SODIUM 200 MG/2ML IV SOLN
INTRAVENOUS | Status: DC | PRN
  Administered 2023-02-11: 200 mg via INTRAVENOUS

## 2023-02-11 MED ORDER — HEPARIN SODIUM (PORCINE) 1000 UNIT/ML IJ SOLN
INTRAMUSCULAR | Status: AC
  Filled 2023-02-11: qty 10

## 2023-02-11 MED ORDER — SODIUM CHLORIDE 0.9 % IV SOLN: 250.0000 mL | INTRAVENOUS | Status: DC | PRN

## 2023-02-11 MED ORDER — DEXAMETHASONE SODIUM PHOSPHATE 10 MG/ML IJ SOLN
INTRAMUSCULAR | Status: DC | PRN
  Administered 2023-02-11: 10 mg via INTRAVENOUS

## 2023-02-11 MED ORDER — ACETAMINOPHEN 325 MG PO TABS: 650.0000 mg | ORAL_TABLET | ORAL | Status: DC | PRN

## 2023-02-11 MED ORDER — ROCURONIUM BROMIDE 10 MG/ML (PF) SYRINGE
PREFILLED_SYRINGE | INTRAVENOUS | Status: DC | PRN
  Administered 2023-02-11: 60 mg via INTRAVENOUS
  Administered 2023-02-11: 20 mg via INTRAVENOUS

## 2023-02-11 MED ORDER — ONDANSETRON HCL 4 MG/2ML IJ SOLN
INTRAMUSCULAR | Status: DC | PRN
  Administered 2023-02-11: 4 mg via INTRAVENOUS

## 2023-02-11 MED ORDER — PROPOFOL 10 MG/ML IV BOLUS
INTRAVENOUS | Status: DC | PRN
  Administered 2023-02-11: 110 mg via INTRAVENOUS

## 2023-02-11 MED ORDER — LIDOCAINE 2% (20 MG/ML) 5 ML SYRINGE
INTRAMUSCULAR | Status: DC | PRN
  Administered 2023-02-11: 60 mg via INTRAVENOUS

## 2023-02-11 MED ORDER — FENTANYL CITRATE (PF) 100 MCG/2ML IJ SOLN
INTRAMUSCULAR | Status: AC
  Filled 2023-02-11: qty 2

## 2023-02-11 MED ORDER — PHENYLEPHRINE 80 MCG/ML (10ML) SYRINGE FOR IV PUSH (FOR BLOOD PRESSURE SUPPORT)
PREFILLED_SYRINGE | INTRAVENOUS | Status: DC | PRN
  Administered 2023-02-11 (×2): 160 ug via INTRAVENOUS

## 2023-02-11 MED ORDER — FENTANYL CITRATE (PF) 100 MCG/2ML IJ SOLN
INTRAMUSCULAR | Status: DC | PRN
  Administered 2023-02-11: 100 ug via INTRAVENOUS

## 2023-02-11 MED ORDER — PANTOPRAZOLE SODIUM 40 MG PO TBEC
40.0000 mg | DELAYED_RELEASE_TABLET | Freq: Every day | ORAL | 0 refills | Status: DC
  Filled 2023-02-11: qty 45, 45d supply, fill #0

## 2023-02-11 MED ORDER — SODIUM CHLORIDE 0.9% FLUSH: 3.0000 mL | INTRAVENOUS | Status: DC | PRN

## 2023-02-11 MED ORDER — COLCHICINE 0.6 MG PO TABS
0.6000 mg | ORAL_TABLET | Freq: Two times a day (BID) | ORAL | 0 refills | Status: DC
  Filled 2023-02-11: qty 10, 5d supply, fill #0

## 2023-02-11 MED ORDER — SODIUM CHLORIDE 0.9 % IV SOLN: INTRAVENOUS | Status: DC

## 2023-02-11 MED ORDER — ONDANSETRON HCL 4 MG/2ML IJ SOLN: 4.0000 mg | Freq: Four times a day (QID) | INTRAMUSCULAR | Status: DC | PRN

## 2023-02-11 SURGICAL SUPPLY — 18 items
BAG SNAP BAND KOVER 36X36 (MISCELLANEOUS) IMPLANT
CATH OCTARAY 2.0 F 3-3-3-3-3 (CATHETERS) IMPLANT
CATH SOUNDSTAR ECO 8FR (CATHETERS) IMPLANT
CATH WEBSTER BI DIR CS D-F CRV (CATHETERS) IMPLANT
CLOSURE PERCLOSE PROSTYLE (VASCULAR PRODUCTS) IMPLANT
COVER SWIFTLINK CONNECTOR (BAG) ×1 IMPLANT
DILATOR VESSEL 38 20CM 16FR (INTRODUCER) IMPLANT
GUIDEWIRE INQWIRE 1.5J.035X260 (WIRE) IMPLANT
INQWIRE 1.5J .035X260CM (WIRE) ×1 IMPLANT
KIT VERSACROSS CNCT FARADRIVE (KITS) IMPLANT
PACK EP LF (CUSTOM PROCEDURE TRAY) ×1 IMPLANT
PAD DEFIB RADIO PHYSIO CONN (PAD) ×1 IMPLANT
PATCH CARTO3 (PAD) IMPLANT
SHEATH FARADRIVE STEERABLE (SHEATH) IMPLANT
SHEATH PINNACLE 8F 10CM (SHEATH) IMPLANT
SHEATH PINNACLE 9F 10CM (SHEATH) IMPLANT
SHEATH PROBE COVER 6X72 (BAG) IMPLANT
TUBING SMART ABLATE COOLFLOW (TUBING) IMPLANT

## 2023-02-11 NOTE — H&P (Signed)
 Electrophysiology Office Note:     Date:  02/11/2023    ID:  [name] [name], DOB 04-19-1950, MRN [id number]   [facility] Cardiologist:  [name] Schilling, MD  [name] [name]:  [name] ONEIDA HOLTS, MD    Referring MD: [name] Ezra RAMAN, MD    Chief Complaint: AF   History of Present Illness:     [name] [name] is a 73 y.o. malewho I am seeing today for an evaluation of AFat the request of Dr [name].   The patient was last seen by Dr [name] on 08/09/2022.   The patient has a medical history that includes bicuspid AV s/p mechanical AVR, pAF/AFL, chronic systolic heart failure. His AVR was in 2011.    In June of this year he was admitted with a newly reduced ejection fraction of 25 to 30%.  He was in an atypical atrial flutter.  Amiodarone  was started.   He is doing well today.  He can tell that he is in normal rhythm.  He feels intense palpitations when his heart is out of rhythm.    Presents for AF/AFL ablation today. Procedure reviewed.     Objective Their past medical, social and family history was reveiwed.     ROS:   Please see the history of present illness.    All other systems reviewed and are negative.   EKGs/Labs/Other Studies Reviewed:     The following studies were reviewed today:   September 06, 2022 echo EF 30 to 35% Moderate to severely reduced LV function Global hypokinesis RV function low normal Mild MR   July 23, 2022 echo EF 25 to 30% Global hypokinesis RV function mildly reduced   February 16, 2021 echo EF 45 to 50% RV function normal Moderately dilated left atrium   July 22, 2022 EKG shows atypical appearing atrial flutter with left bundle branch block July 24, 2022 EKG shows sinus rhythm, short PR, left on March block    EKG Interpretation Date/Time:                  Thursday September 12 2022 10:12:07 EDT Ventricular Rate:         61 PR Interval:                 230 QRS Duration:             146 QT Interval:                  456 QTC Calculation:459 R Axis:                         -47   Text Interpretation:Sinus rhythm with 1st degree A-V block Left axis deviation Left bundle branch block Confirmed by [name] [name] [phone]) on 09/12/2022 10:13:10 AM      Physical Exam:     VS:  BP 137/59 (BP Location: Left Arm, Patient Position: Sitting, Cuff Size: Large)   Pulse 54   Ht 5' 11 (1.803 m)   Wt 226 lb 9.6 oz (102.8 kg)   SpO2 97%   BMI 31.60 kg/m         Wt Readings from Last 3 Encounters:  09/12/22 226 lb 9.6 oz (102.8 kg)  08/09/22 226 lb (102.5 kg)  07/30/22 232 lb (105.2 kg)      GEN:  Well nourished, well developed in no acute distress CARDIAC: RRR, no murmurs, rubs, gallops.  Crisp mechanical click of the aortic  valve. RESPIRATORY:  Clear to auscultation without rales, wheezing or rhonchi      Assessment ASSESSMENT AND PLAN:     1. Persistent atrial fibrillation (HCC)   2. Atrial flutter, unspecified type (HCC)   3. Encounter for long-term (current) use of high-risk medication   4. HFrEF (heart failure with reduced ejection fraction) (HCC)       #Persistent atrial fibrillation and flutter #High risk med monitoring-amiodarone  Maintaining sinus rhythm on amiodarone .  His ejection fraction worsened in the setting of atrial fibrillation/flutter.  Rhythm control indicated. Continue Coumadin  (mechanical aortic valve)   I discussed treatment options for his atrial arrhythmias including continuing with antiarrhythmic drugs versus pursuing catheter ablation.  Lesion set would include pulmonary veins, posterior wall, CTI +/- any atypical flutter circuits.   Discussed treatment options today for AF including antiarrhythmic drug therapy and ablation. Discussed risks, recovery and likelihood of success with each treatment strategy. Risk, benefits, and alternatives to EP study and ablation for afib were discussed. These risks include but are not limited to stroke, bleeding, vascular damage,  tamponade, perforation, damage to the esophagus, lungs, phrenic nerve and other structures, pulmonary vein stenosis, worsening renal function, coronary vasospasm and death.  Discussed potential need for repeat ablation procedures and antiarrhythmic drugs after an initial ablation. The patient understands these risk and wishes to proceed.  We will therefore proceed with catheter ablation at the next available time.  Carto, ICE, anesthesia are requested for the procedure.  Will also obtain CT PV protocol prior to the procedure to exclude LAA thrombus and further evaluate atrial anatomy.     #Chronic systolic heart failure #Left bundle branch block NYHA class II-III.  EF 25 to 30% on most recent echocardiogram.  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 Continue GDMT, Entresto , Jardiance , spironolactone  If no improvement with maintenance of NSR, consider CRT-D.     Presents for AF/AFL ablation today. Procedure reviewed.  Signed, [name] DASEN. Cindie, MD, [facility], [facility] 02/11/2023 Electrophysiology  [facility]

## 2023-02-11 NOTE — Progress Notes (Signed)
 Patient notified of rx increase and advised to call back if no help or develops other sx.

## 2023-02-11 NOTE — Anesthesia Procedure Notes (Signed)
 Procedure Name: Intubation Date/Time: 02/11/2023 10:06 AM  Performed by: Cindie Donald CROME, CRNAPre-anesthesia Checklist: Patient identified, Emergency Drugs available, Suction available and Patient being monitored Patient Re-evaluated:Patient Re-evaluated prior to induction Oxygen Delivery Method: Circle System Utilized Preoxygenation: Pre-oxygenation with 100% oxygen Induction Type: IV induction Ventilation: Mask ventilation without difficulty Laryngoscope Size: Glidescope and 4 Grade View: Grade I Tube type: Oral Tube size: 7.5 mm Number of attempts: 2 Airway Equipment and Method: Stylet Placement Confirmation: ETT inserted through vocal cords under direct vision, positive ETCO2 and breath sounds checked- equal and bilateral Secured at: 23 cm Tube secured with: Tape Dental Injury: Teeth and Oropharynx as per pre-operative assessment  Comments: DL x1 with MAC 4--Grade 2 view but unable to pass ETT. Easy mask ventilation. Glidescope S4 used--Grade 1 view and able to pass ETT successfully. No adverse events.

## 2023-02-11 NOTE — Discharge Instructions (Signed)

## 2023-02-11 NOTE — Progress Notes (Signed)
 Patient and wife was given discharge instructions. Both verbalized understanding.

## 2023-02-11 NOTE — Transfer of Care (Signed)
 Immediate Anesthesia Transfer of Care Note  Patient: [name] [name]  Procedure(s) Performed: ATRIAL FIBRILLATION ABLATION  Patient Location: PACU  Anesthesia Type:General  Level of Consciousness: awake, alert , oriented, and patient cooperative  Airway & Oxygen Therapy: Patient Spontanous Breathing and Patient connected to nasal cannula oxygen  Post-op Assessment: Report given to RN and Post -op Vital signs reviewed and stable  Post vital signs: Reviewed and stable  Last Vitals:  Vitals Value Taken Time  BP 130/60 1123  Temp    Pulse 59 1123  Resp 13 1123  SpO2 92% 1123    Last Pain:  Vitals:   02/11/23 0848  TempSrc: Oral  PainSc: 0-No pain         Complications: No notable events documented.

## 2023-02-11 NOTE — Anesthesia Postprocedure Evaluation (Signed)
 Anesthesia Post Note  Patient: [name] [name]  Procedure(s) Performed: ATRIAL FIBRILLATION ABLATION     Patient location during evaluation: Cath Lab Anesthesia Type: General Level of consciousness: awake and alert Pain management: pain level controlled Vital Signs Assessment: post-procedure vital signs reviewed and stable Respiratory status: spontaneous breathing, nonlabored ventilation, respiratory function stable and patient connected to nasal cannula oxygen Cardiovascular status: blood pressure returned to baseline and stable Postop Assessment: no apparent nausea or vomiting Anesthetic complications: no   No notable events documented.  Last Vitals:  Vitals:   02/11/23 1200 02/11/23 1210  BP: 133/66 126/65  Pulse: (!) 58 (!) 57  Resp: (!) 0 14  Temp:    SpO2: 93% 90%    Last Pain:  Vitals:   02/11/23 1155  TempSrc: Oral  PainSc: 0-No pain                 Jendaya Gossett,W. EDMOND

## 2023-02-11 NOTE — Anesthesia Preprocedure Evaluation (Addendum)
 Anesthesia Evaluation  Patient identified by MRN, date of birth, ID band Patient awake    Reviewed: Allergy & Precautions, H&P , NPO status , Patient's Chart, lab work & pertinent test results  History of Anesthesia Complications Negative for: history of anesthetic complications  Airway Mallampati: III  TM Distance: >3 FB Neck ROM: Full    Dental no notable dental hx. (+) Teeth Intact, Dental Advisory Given   Pulmonary sleep apnea    Pulmonary exam normal breath sounds clear to auscultation       Cardiovascular hypertension, Pt. on medications and Pt. on home beta blockers +CHF  + dysrhythmias Atrial Fibrillation + Valvular Problems/Murmurs (s/p AVR)  Rhythm:Irregular Rate:Normal   '24 RHC/LHC -  Borderline/mildly elevated right-sided filling pressures.  2. Preserved cardiac output.  3. Minimal CAD.   '24 TTE - EF 30 to 35%. Global hypokinesis. There is mild left ventricular hypertrophy. Mild mitral valve regurgitation. There is a mechanical valve present in the aortic position. Echo findings are consistent with normal structure and function of the aortic valve prosthesis.     Neuro/Psych negative neurological ROS  negative psych ROS   GI/Hepatic Neg liver ROS, PUD,GERD  Medicated,,  Endo/Other  diabetes, Type 2, Oral Hypoglycemic Agents   Obesity   Renal/GU negative Renal ROS  negative genitourinary   Musculoskeletal negative musculoskeletal ROS (+)    Abdominal   Peds  Hematology  (+) Blood dyscrasia, anemia  On coumadin     Anesthesia Other Findings   Reproductive/Obstetrics negative OB ROS                             Anesthesia Physical Anesthesia Plan  ASA: 4  Anesthesia Plan: General   Post-op Pain Management: Tylenol  PO (pre-op)* and Minimal or no pain anticipated   Induction: Intravenous  PONV Risk Score and Plan: 2 and Treatment may vary due to age or medical  condition, Ondansetron  and Dexamethasone   Airway Management Planned: Oral ETT  Additional Equipment: None  Intra-op Plan:   Post-operative Plan: Extubation in OR  Informed Consent: I have reviewed the patients History and Physical, chart, labs and discussed the procedure including the risks, benefits and alternatives for the proposed anesthesia with the patient or authorized representative who has indicated his/her understanding and acceptance.     Dental advisory given  Plan Discussed with: CRNA and Anesthesiologist  Anesthesia Plan Comments:         Anesthesia Quick Evaluation

## 2023-02-12 ENCOUNTER — Encounter (HOSPITAL_COMMUNITY): Payer: Self-pay | Admitting: Cardiology

## 2023-02-12 MED FILL — Heparin Sodium (Porcine) Inj 1000 Unit/ML: INTRAMUSCULAR | Qty: 10 | Status: AC

## 2023-02-17 ENCOUNTER — Telehealth: Payer: Self-pay

## 2023-02-17 DIAGNOSIS — Z7901 Long term (current) use of anticoagulants: Secondary | ICD-10-CM

## 2023-02-17 DIAGNOSIS — Z125 Encounter for screening for malignant neoplasm of prostate: Secondary | ICD-10-CM

## 2023-02-17 DIAGNOSIS — E119 Type 2 diabetes mellitus without complications: Secondary | ICD-10-CM

## 2023-02-17 NOTE — Telephone Encounter (Signed)
Noted. Thanks.

## 2023-02-17 NOTE — Telephone Encounter (Signed)
Pt was scheduled for an ablation on 1/14 and had 4 weekly INRs in range before the procedure. Last INR was 2.5 (range 2.5-3.5).  Pt reports they did not perform the procedure and it was cancelled. It appears in the chart that the ablation was performed but pt said they cancelled it. Pt has f/u wit cardiology tomorrow.   Pt will be due for INR check in 4 weeks. Pt has apt for labs at [facility] on 2/4 at 9:00. Will send msg to PCP requesting POCT or lab INR at that apt so pt does not have to make another apt for coumadin clinic.   Advised he could have INR checked at lab apt on 2/4 and this nurse will call with warfarin dosing instructions. Advised if any changes to contact coumadin clinic. Pt verbalized understanding and was appreciative of the call.   Made note on lab apt for POCT or lab INR. Forwarding msg to PCP.

## 2023-02-18 ENCOUNTER — Ambulatory Visit (HOSPITAL_COMMUNITY)
Admission: RE | Admit: 2023-02-18 | Discharge: 2023-02-18 | Disposition: A | Discharge status: Home / Self Care | Payer: Medicare PPO | Source: Ambulatory Visit | Attending: Cardiology | Admitting: Cardiology

## 2023-02-18 DIAGNOSIS — I420 Dilated cardiomyopathy: Secondary | ICD-10-CM | POA: Insufficient documentation

## 2023-02-18 LAB — ECHOCARDIOGRAM COMPLETE
AR max vel: 1.64 cm2
AV Area VTI: 1.72 cm2
AV Area mean vel: 1.62 cm2
AV Mean grad: 6.5 mm[Hg]
AV Peak grad: 12.4 mm[Hg]
Ao pk vel: 1.76 m/s
Area-P 1/2: 2.26 cm2
Calc EF: 48.4 %
S' Lateral: 3.95 cm
Single Plane A2C EF: 44.4 %
Single Plane A4C EF: 54 %

## 2023-02-18 MED ORDER — PERFLUTREN LIPID MICROSPHERE
1.0000 mL | INTRAVENOUS | Status: AC | PRN
  Administered 2023-02-18: 4 mL via INTRAVENOUS

## 2023-02-22 ENCOUNTER — Other Ambulatory Visit: Payer: Self-pay | Admitting: Cardiology

## 2023-02-25 ENCOUNTER — Other Ambulatory Visit: Payer: Self-pay

## 2023-02-25 ENCOUNTER — Other Ambulatory Visit: Payer: Self-pay | Admitting: Family Medicine

## 2023-02-25 DIAGNOSIS — Z7901 Long term (current) use of anticoagulants: Secondary | ICD-10-CM

## 2023-03-04 ENCOUNTER — Ambulatory Visit (INDEPENDENT_AMBULATORY_CARE_PROVIDER_SITE_OTHER): Payer: Self-pay

## 2023-03-04 ENCOUNTER — Other Ambulatory Visit (INDEPENDENT_AMBULATORY_CARE_PROVIDER_SITE_OTHER): Payer: Medicare PPO

## 2023-03-04 DIAGNOSIS — E119 Type 2 diabetes mellitus without complications: Secondary | ICD-10-CM | POA: Diagnosis not present

## 2023-03-04 DIAGNOSIS — Z7901 Long term (current) use of anticoagulants: Secondary | ICD-10-CM

## 2023-03-04 DIAGNOSIS — Z125 Encounter for screening for malignant neoplasm of prostate: Secondary | ICD-10-CM

## 2023-03-04 LAB — MICROALBUMIN / CREATININE URINE RATIO
Creatinine,U: 42.9 mg/dL
Microalb Creat Ratio: 1.9 mg/g (ref 0.0–30.0)
Microalb, Ur: 0.8 mg/dL (ref 0.0–1.9)

## 2023-03-04 LAB — CBC WITH DIFFERENTIAL/PLATELET
Basophils Absolute: 0 10*3/uL (ref 0.0–0.1)
Basophils Relative: 0.9 % (ref 0.0–3.0)
Eosinophils Absolute: 0.1 10*3/uL (ref 0.0–0.7)
Eosinophils Relative: 3.5 % (ref 0.0–5.0)
HCT: 44.2 % (ref 39.0–52.0)
Hemoglobin: 14.9 g/dL (ref 13.0–17.0)
Lymphocytes Relative: 36.1 % (ref 12.0–46.0)
Lymphs Abs: 1.5 10*3/uL (ref 0.7–4.0)
MCHC: 33.7 g/dL (ref 30.0–36.0)
MCV: 97.2 fL (ref 78.0–100.0)
Monocytes Absolute: 0.6 10*3/uL (ref 0.1–1.0)
Monocytes Relative: 13.9 % — ABNORMAL HIGH (ref 3.0–12.0)
Neutro Abs: 1.8 10*3/uL (ref 1.4–7.7)
Neutrophils Relative %: 45.6 % (ref 43.0–77.0)
Platelets: 140 10*3/uL — ABNORMAL LOW (ref 150.0–400.0)
RBC: 4.55 Mil/uL (ref 4.22–5.81)
RDW: 14.4 % (ref 11.5–15.5)
WBC: 4 10*3/uL (ref 4.0–10.5)

## 2023-03-04 LAB — BASIC METABOLIC PANEL
BUN: 16 mg/dL (ref 6–23)
CO2: 23 meq/L (ref 19–32)
Calcium: 9 mg/dL (ref 8.4–10.5)
Chloride: 103 meq/L (ref 96–112)
Creatinine, Ser: 1.18 mg/dL (ref 0.40–1.50)
GFR: 62.3 mL/min (ref >60.00)
Glucose, Bld: 128 mg/dL — ABNORMAL HIGH (ref 70–99)
Potassium: 4.6 meq/L (ref 3.5–5.1)
Sodium: 137 meq/L (ref 135–145)

## 2023-03-04 LAB — PSA, MEDICARE: PSA: 0.7 ng/mL (ref 0.10–4.00)

## 2023-03-04 LAB — POCT INR: INR: 3 (ref 2.0–3.0)

## 2023-03-04 LAB — LIPID PANEL
Cholesterol: 170 mg/dL (ref 0–200)
HDL: 56 mg/dL (ref >39.00)
LDL Cholesterol: 89 mg/dL (ref 0–99)
NonHDL: 114.13
Total CHOL/HDL Ratio: 3
Triglycerides: 128 mg/dL (ref 0.0–149.0)
VLDL: 25.6 mg/dL (ref 0.0–40.0)

## 2023-03-04 LAB — HEMOGLOBIN A1C: Hgb A1c MFr Bld: 6.8 % — ABNORMAL HIGH (ref 4.6–6.5)

## 2023-03-04 NOTE — Progress Notes (Signed)
 Pt was scheduled for an ablation on 1/14 and had 4 weekly INRs in range before the procedure. Last INR was 2.5 (range 2.5-3.5).  Pt reports they did not perform the procedure and it was cancelled. It appears in the chart that the ablation was performed but pt said they cancelled it. Pt has f/u wit cardiology tomorrow.  Pt was in [facility] lab today and POCT INR was performed and result was sent to this nurse. Continue 1 tablet daily. Recheck in 6 weeks. LVM for pt with dosing instructions and to contact coumadin  clinic for next apt.

## 2023-03-04 NOTE — Patient Instructions (Addendum)
 Pre visit review using our clinic review tool, if applicable. No additional management support is needed unless otherwise documented below in the visit note.  Continue 1 tablet daily.  Re-check in 6 weeks.

## 2023-03-04 NOTE — Progress Notes (Signed)
Pt returned call and requested recheck of INR in 5 weeks due to conflicting apt is 6 weeks. Scheduled pt for next coumadin clinic apt and advised of dosing. Pt verbalized understanding.

## 2023-03-11 ENCOUNTER — Encounter: Payer: Self-pay | Admitting: Family Medicine

## 2023-03-11 ENCOUNTER — Ambulatory Visit (INDEPENDENT_AMBULATORY_CARE_PROVIDER_SITE_OTHER): Payer: Medicare PPO | Admitting: Family Medicine

## 2023-03-11 VITALS — BP 126/62 | HR 81 | Temp 97.8°F | Ht 68.9 in | Wt 229.2 lb

## 2023-03-11 DIAGNOSIS — L853 Xerosis cutis: Secondary | ICD-10-CM

## 2023-03-11 DIAGNOSIS — I10 Essential (primary) hypertension: Secondary | ICD-10-CM

## 2023-03-11 DIAGNOSIS — Z7984 Long term (current) use of oral hypoglycemic drugs: Secondary | ICD-10-CM | POA: Diagnosis not present

## 2023-03-11 DIAGNOSIS — E785 Hyperlipidemia, unspecified: Secondary | ICD-10-CM

## 2023-03-11 DIAGNOSIS — Z0000 Encounter for general adult medical examination without abnormal findings: Secondary | ICD-10-CM

## 2023-03-11 DIAGNOSIS — E119 Type 2 diabetes mellitus without complications: Secondary | ICD-10-CM

## 2023-03-11 DIAGNOSIS — Z7189 Other specified counseling: Secondary | ICD-10-CM

## 2023-03-11 MED ORDER — FLUOCINONIDE 0.05 % EX CREA: TOPICAL_CREAM | CUTANEOUS | 1 refills | Status: AC

## 2023-03-11 MED ORDER — GLIPIZIDE 5 MG PO TABS: ORAL_TABLET | ORAL | Status: AC

## 2023-03-11 NOTE — Progress Notes (Unsigned)
Flu 2024 PNA 2020 Shingles prev done.   Tetanus 2015 Covid vaccine 2023 RSV 2023.   Colonoscopy 2013, IFOB neg 2024    PSA wnl 2025 Advance directive- wife designated if patient were incapacitated.  Diabetes:  Using medications without difficulties: see below, jardiance, metformin, glipizide.  Hypoglycemic episodes: down to 70s occ.   Hyperglycemic episodes:no Feet problems:no Blood Sugars averaging: 120-130 eye exam within last year: yes D/w pt about changing guidelines for glipizide use, see orders.  He had better control of his sugar with CGM use.  D/w pt, I am going to check with pharmacy about coverage for that.   Recent labs d/w pt.   Still anticoagulated.  No bleeding.  Compliant.    Had dry and cracking skin on the hands.  See exam.   Hypertension:    Using medication without problems or lightheadedness: yes Chest pain with exertion:no Edema:no Short of breath:no  Elevated Cholesterol: Using medications without problems: yes Muscle aches: no Diet compliance: d/w pt.  Exercise: d/w pt.    He has cardiology f/u pending.   PMH and SH reviewed.   Vital signs, Meds and allergies reviewed.  ROS: Per HPI unless specifically indicated in ROS section   GEN: nad, alert and oriented HEENT: ncat NECK: supple w/o LA CV: RRR.  Click noted at baseline.  PULM: ctab, no inc wob ABD: soft, +bs EXT: no edema SKIN: no acute rash but dry skin on the R fingers, extensor side.   Diabetic foot exam: Normal inspection No skin breakdown No calluses  Normal DP pulses Normal sensation to light tough and monofilament Nails normal

## 2023-03-11 NOTE — Patient Instructions (Addendum)
1 tab if AM sugar is above 150, 2 tabs if AM sugar is above 175. If sugar is below 150, skip dose.  Recheck A1c in about 6 months at a visit.  Try lidex on the dry skin if needed. Update me as needed.

## 2023-03-12 ENCOUNTER — Encounter: Payer: Self-pay | Admitting: Cardiology

## 2023-03-12 ENCOUNTER — Ambulatory Visit (HOSPITAL_COMMUNITY): Payer: Medicare PPO | Admitting: Internal Medicine

## 2023-03-12 ENCOUNTER — Telehealth: Payer: Self-pay

## 2023-03-12 ENCOUNTER — Ambulatory Visit: Payer: Medicare PPO | Attending: Cardiology | Admitting: Cardiology

## 2023-03-12 VITALS — BP 138/60 | HR 51 | Ht 71.0 in | Wt 228.4 lb

## 2023-03-12 DIAGNOSIS — Z952 Presence of prosthetic heart valve: Secondary | ICD-10-CM

## 2023-03-12 DIAGNOSIS — I4891 Unspecified atrial fibrillation: Secondary | ICD-10-CM | POA: Diagnosis not present

## 2023-03-12 DIAGNOSIS — L853 Xerosis cutis: Secondary | ICD-10-CM | POA: Insufficient documentation

## 2023-03-12 DIAGNOSIS — I10 Essential (primary) hypertension: Secondary | ICD-10-CM

## 2023-03-12 DIAGNOSIS — I420 Dilated cardiomyopathy: Secondary | ICD-10-CM

## 2023-03-12 NOTE — Assessment & Plan Note (Signed)
Flu 2024 PNA 2020 Shingles prev done.   Tetanus 2015 Covid vaccine 2023 RSV 2023.   Colonoscopy 2013, IFOB neg 2024    PSA wnl 2025 Advance directive- wife designated if patient were incapacitated.

## 2023-03-12 NOTE — Assessment & Plan Note (Signed)
He can use lidex on the dry skin if needed. Update me as needed.

## 2023-03-12 NOTE — Assessment & Plan Note (Signed)
Advance directive- wife designated if patient were incapacitated.

## 2023-03-12 NOTE — Assessment & Plan Note (Signed)
D/w pt about changing guidelines for glipizide use, see orders.  He had better control of his sugar with CGM use.  D/w pt, I am going to check with pharmacy about coverage for that.    Continue jardiance, metformin, glipizide.  Recheck A1c in about 6 months at a visit.

## 2023-03-12 NOTE — Assessment & Plan Note (Signed)
Continue coreg amiodarone jardiance entresto and spironolactone. Recent labs d/w pt.

## 2023-03-12 NOTE — Telephone Encounter (Signed)
Called patient to advise of dates for the CRT-D implant. Left LVM to call back.   Dates currently open: 03/05 @ 230 or 03/13 @ 230  Awaiting Warfarin instructions by Dr.Lambert.

## 2023-03-12 NOTE — Progress Notes (Signed)
Electrophysiology Clinic Note    Date:  03/12/2023  Patient ID:  [name] [name], [name] [name] 1950/07/01, MRN [id number] PCP:  Joaquim Nam, MD  Cardiologist:  Rollene Rotunda, MD Electrophysiologist: Lanier Prude, MD   Discussed the use of AI scribe software for clinical note transcription with the patient, who gave verbal consent to proceed.   Patient Profile    Chief Complaint: HF, LBBB  History of Present Illness: [name] [name] is a 73 y.o. male with PMH notable for bicuspid AV s/p mechanical AVR, parox AFib, flutter, HFrEF; seen today for Lanier Prude, MD for routine electrophysiology followup.  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  He reports no recent episodes of heart racing.   The patient's INR levels have been stable, and he has not experienced any symptoms of bleeding or clotting. His INR is managed at his PCP office.  He denies recent changes in his weight or any significant swelling. Usual home weight is ~225lbs. The patient is able to perform daily activities, including walking his dogs, without experiencing shortness of breath. He sleeps propped up at night due to acid reflux.  He checks his BP daily, most readings 120 systolic, occasional has upper 90s.     AAD History: Amiodarone     ROS:  Please see the history of present illness. All other systems are reviewed and otherwise negative.    Physical Exam    VS:  BP 138/60 (BP Location: Left Arm, Patient Position: Sitting, Cuff Size: Normal)   Pulse (!) 51   Ht 5\' 11"  (1.803 m)   Wt 228 lb 6 oz (103.6 kg)   SpO2 96%   BMI 31.85 kg/m  BMI: Body mass index is 31.85 kg/m.  Wt Readings from Last 3 Encounters:  03/12/23 228 lb 6 oz (103.6 kg)  03/11/23 229 lb 3.2 oz (104 kg)  02/11/23 228 lb (103.4 kg)     GEN- The patient is well appearing, alert and oriented x 3 today.   Lungs- Clear to ausculation bilaterally, normal work of breathing.  Heart-  Regular rate and rhythm, valvular click appreciated. no rubs or gallops Extremities- No peripheral edema, warm, dry    Studies Reviewed   Previous EP, cardiology notes.    EKG is ordered. Personal review of EKG from today shows:    EKG Interpretation Date/Time:  Wednesday March 12 2023 11:03:15 EST Ventricular Rate:  51 PR Interval:  244 QRS Duration:  162 QT Interval:  504 QTC Calculation: 464 R Axis:   -44  Text Interpretation: Sinus bradycardia with 1st degree A-V block Left axis deviation Left bundle branch block Confirmed by Sherie Don [phone]) on 03/12/2023 11:04:43 AM    TTE, 02/18/2023  1. Left ventricular ejection fraction, by estimation, is 30 to 35%. The left ventricle has moderately decreased function. The left ventricle demonstrates global hypokinesis. There is mild left ventricular hypertrophy. Left ventricular diastolic parameters are consistent with Grade I diastolic dysfunction (impaired relaxation).   2. Right ventricular systolic function is mildly reduced. The right ventricular size is normal.   3. The mitral valve is normal in structure. No evidence of mitral valve regurgitation. No evidence of mitral stenosis.   4. The aortic valve has been repaired/replaced. Aortic valve regurgitation is not visualized. No aortic stenosis is present. There is a [employer] mechanical valve present in the aortic position. Procedure Date: 06/2009 . Echo findings are consistent with normal structure and function  of the aortic valve prosthesis. Aortic valve area, by VTI measures 1.72 cm. Aortic valve mean gradient measures 6.5 mmHg. Aortic valve Vmax measures 1.76 m/s.   5. The inferior vena cava is normal in size with greater than 50% respiratory variability, suggesting right atrial pressure of 3 mmHg.   Comparison(s): No significant change from prior study. Prior images reviewed side by side. The left ventricular function is unchanged. EF 30%, mild LVH, AOV mean 7.5, peak 14.7.    AF ablation, 02/11/2023 1.  Persistent atrial fibrillation.  Aborted atrial fibrillation ablation today due to interrupted inferior vena cava. 2. Intracardiac echo reveals interrupted inferior vena cava 3. No early apparent complications  Cardiac CT, 01/30/2023 1. There is normal pulmonary vein drainage into the left atrium with ostial measurements above.  2. There is no thrombus in the left atrial appendage.  3. The esophagus runs in the left atrial midline and is not in proximity to any of the pulmonary vein ostia.  4. No PFO/ASD.  5. Normal coronary origin. Right dominance.  6. CAC score of 0 which is 1st percentile for age-, race-, and sex-matched controls.  7. Aortic atherosclerosis.  8.  Aortic valve replacement present.   Assessment and Plan     #) parox afib S/p aborted AF ablation d/t interrupted inferior vena cava Maintaining sinus rhythm on amiodarone 200mg  daily No off-target effects of amiodarone Recent LFTs and TSH stable  #) Hypercoag d/t parox afib #) mechanical aortic valve CHA2DS2-VASc Score = at least 4 [CHF History: 1, HTN History: 1, Diabetes History: 1, Stroke History: 0, Vascular Disease History: 0, Age Score: 1, Gender Score: 0].  Therefore, the patient's annual risk of stroke is 4.8 %.    Stroke ppx - warfarin, managed at PCP office  No bleeding concerns  #) HFrEF #) LBBB LVEF remains reduced despite maximal GDMT  Patient meets criteria for CRT-D Discussed the benefits and risks of a Cardiac Resynchronization Therapy Defibrillator (CRT-D) to improve heart function and prevent life-threatening arrhythmias. Patient verbalized understanding and wished to proceed Will message EP scheduler to move forward with scheduling procedure at [facility] with Dr. Lalla Brothers       Current medicines are reviewed at length with the patient today.   The patient does not have concerns regarding his medicines.  The following changes were made today:  none  Labs/ tests ordered  today include:  Orders Placed This Encounter  Procedures   EKG 12-Lead     Disposition: Follow up with Dr. Lalla Brothers or EP APP as usual post procedure   Signed, Sherie Don, NP  03/12/23  12:10 PM  Electrophysiology [facility]

## 2023-03-12 NOTE — Patient Instructions (Signed)
Medication Instructions:  The current medical regimen is effective;  continue present plan and medications.  *If you need a refill on your cardiac medications before your next appointment, please call your pharmacy*   Testing/Procedures: We will call you for a date for CRT-D with Dr.Lambert.    Follow-Up: At [facility], you and your health needs are our priority.  As part of our continuing mission to provide you with exceptional heart care, we have created designated Provider Care Teams.  These Care Teams include your primary Cardiologist (physician) and Advanced Practice Providers (APPs -  Physician Assistants and Nurse Practitioners) who all work together to provide you with the care you need, when you need it.  We recommend signing up for the patient portal called "MyChart".  Sign up information is provided on this After Visit Summary.  MyChart is used to connect with patients for Virtual Visits (Telemedicine).  Patients are able to view lab/test results, encounter notes, upcoming appointments, etc.  Non-urgent messages can be sent to your provider as well.   To learn more about what you can do with MyChart, go to [email or web address].    Your next appointment:   We will call you for follow up appointments.

## 2023-03-12 NOTE — Assessment & Plan Note (Signed)
Continue pravastatin.  He can tolerate that. Recent labs d/w pt.

## 2023-03-13 ENCOUNTER — Other Ambulatory Visit: Payer: Self-pay

## 2023-03-13 DIAGNOSIS — I4891 Unspecified atrial fibrillation: Secondary | ICD-10-CM

## 2023-03-13 DIAGNOSIS — I420 Dilated cardiomyopathy: Secondary | ICD-10-CM

## 2023-03-13 NOTE — Addendum Note (Signed)
Addended by: Cydney Ok on: 03/13/2023 03:55 PM   Modules accepted: Orders

## 2023-03-13 NOTE — Telephone Encounter (Signed)
Called patient, CRT-D implant scheduled with Dr.Lambert on 03/13 @ 730 AM. Arrival time 530 AM. Patient aware. Sent to precert.   Will route to PCP and coumadin nurse for further adjustments to Coumadin and possible bridge.

## 2023-03-13 NOTE — Telephone Encounter (Signed)
Patient is returning LPN's call stating he would like to do the 03/13 at 2:30 pm.

## 2023-03-14 ENCOUNTER — Other Ambulatory Visit (HOSPITAL_COMMUNITY): Payer: Self-pay

## 2023-03-14 DIAGNOSIS — I420 Dilated cardiomyopathy: Secondary | ICD-10-CM

## 2023-03-14 DIAGNOSIS — Z7901 Long term (current) use of anticoagulants: Secondary | ICD-10-CM

## 2023-03-14 NOTE — Telephone Encounter (Addendum)
Lovenox bridge recommended. Lovenox dosing is 100 mg Q12H. Current warfarin dosing; 1 tablet (3 mg) daily  Surgery scheduled for 3/13.  Lovenox bridge schedule;  3/8: Take last dose of warfarin 3/9: NO warfarin, NO Lovenox 3/10: NO warfarin, inject Lovenox every 12 hours 3/11: NO warfarin, inject Lovenox every 12 hours 3/12: NO warfarin, inject Lovenox ONCE IN THE AM  3/13: SURGERY; NO WARFARIN, NO LOVENOX  3/14: Take 1 1/2 tablets (4.5 mg) warfarin, inject Lovenox every 12 hours 3/15: Take 1 1/2 tablets (4.5 mg) warfarin, inject Lovenox every 12 hours 3/16: Take 1 1/2 tablets (4.5 mg) warfarin, inject Lovenox every 12 hours 3/17: Take 1 1/2 tablets (4.5 mg) warfarin, inject Lovenox every 12 hours 3/18: Take 1 tablet (3 mg) warfarin, inject Lovenox every 12 hours 3/19: Take 1 tablet (3 mg) warfarin, inject Lovenox every 12 hours 3/20: CHECK INR; NO WARFARIN AND NO LOVENOX UNTIL AFTER INR CHECK

## 2023-03-14 NOTE — Telephone Encounter (Signed)
Agreed and thanks.  Please let me know if I need to send the lovenox rx.

## 2023-03-14 NOTE — Telephone Encounter (Signed)
Contacted pt and advised of lovenox bridge. Pt agreed. Scheduled pt for INR check and instructions for 3/6. Pt verbalized understanding.

## 2023-03-17 ENCOUNTER — Telehealth: Payer: Self-pay | Admitting: Family Medicine

## 2023-03-17 NOTE — Telephone Encounter (Signed)
-----   Message from Nurse Raynelle Fanning T sent at 03/13/2023  3:44 PM EST ----- Good afternoon Dr.Jasin Brazel and Carollee Herter,  This patient is scheduled for a CRT-D implant with Dr.Lambert on 03/13. Patient has a mechanical AVR, and is not able to hold his Coumadin. Since you all manage the Coumadin, I was hoping you could look at what he could do and let us know.   I have added Dr.Lambert's nurse to this message as well to keep her in the loop.   Thank you!

## 2023-03-17 NOTE — Telephone Encounter (Signed)
-----   Message from Loree Fee sent at 03/13/2023  8:35 AM EST ----- Medicare currently still required at least 1 insulin injection per day unfortunately ----- Message ----- From: Joaquim Nam, MD Sent: 03/12/2023  11:30 PM EST To: Loree Fee, RPH  This patient had better control of his sugar with CGM use. He isn't on insulin.  Would he qualify for coverage with a CGM?    Thanks.   Clelia Croft

## 2023-03-17 NOTE — Telephone Encounter (Signed)
See below and please update patient.  Thanks.  

## 2023-03-17 NOTE — Telephone Encounter (Signed)
[name] [name]- can you please talk to me about this patient?  Many thanks.

## 2023-03-19 NOTE — Telephone Encounter (Signed)
 LMTCB

## 2023-03-20 NOTE — Telephone Encounter (Signed)
D/w Sherrie George about patient and she is going to address this at the upcoming anticoagulation clinic appointment.  I thank all involved.

## 2023-03-20 NOTE — Telephone Encounter (Signed)
Copied from CRM [id number]. Topic: General - Other >> Mar 20, 2023 12:12 PM Truddie Crumble wrote: Reason for CRM: patient returning a call to the office for Shanda Bumps

## 2023-03-20 NOTE — Telephone Encounter (Signed)
 Patient notified

## 2023-04-01 ENCOUNTER — Other Ambulatory Visit: Payer: Self-pay | Admitting: Cardiology

## 2023-04-03 ENCOUNTER — Telehealth (HOSPITAL_COMMUNITY): Payer: Self-pay

## 2023-04-03 ENCOUNTER — Ambulatory Visit: Payer: Medicare PPO

## 2023-04-03 DIAGNOSIS — Z7901 Long term (current) use of anticoagulants: Secondary | ICD-10-CM | POA: Diagnosis not present

## 2023-04-03 LAB — POCT INR: INR: 4 — AB (ref 2.0–3.0)

## 2023-04-03 MED ORDER — ENOXAPARIN SODIUM 100 MG/ML IJ SOSY: 100.0000 mg | PREFILLED_SYRINGE | Freq: Two times a day (BID) | INTRAMUSCULAR | 0 refills | Status: DC

## 2023-04-03 NOTE — Telephone Encounter (Signed)
 Attempted to reach patient to discuss upcoming procedure, no answer. Left VM for patient to return call.

## 2023-04-03 NOTE — Patient Instructions (Addendum)
 Pre visit review using our clinic review tool, if applicable. No additional management support is needed unless otherwise documented below in the visit note.  Hold dose today and then continue 1 tablet daily until starting instructions below.  3/8: Take last dose of warfarin 3/9: NO warfarin, NO Lovenox 3/10: NO warfarin, inject Lovenox every 12 hours 3/11: NO warfarin, inject Lovenox every 12 hours 3/12: NO warfarin, inject Lovenox ONCE IN THE AM  3/13: SURGERY; NO WARFARIN, NO LOVENOX  3/14: Take 1 1/2 tablets (4.5 mg) warfarin, inject Lovenox every 12 hours 3/15: Take 1 1/2 tablets (4.5 mg) warfarin, inject Lovenox every 12 hours 3/16: Take 1 1/2 tablets (4.5 mg) warfarin, inject Lovenox every 12 hours 3/17: Take 1 1/2 tablets (4.5 mg) warfarin, inject Lovenox every 12 hours 3/18: Take 1 tablet (3 mg) warfarin, inject Lovenox every 12 hours 3/19: Take 1 tablet (3 mg) warfarin, inject Lovenox every 12 hours 3/20: CHECK INR; NO WARFARIN AND NO LOVENOX UNTIL AFTER INR CHECK

## 2023-04-03 NOTE — Addendum Note (Signed)
 Addended by: Primitivo Gauze on: 04/03/2023 04:45 PM   Modules accepted: Orders

## 2023-04-03 NOTE — Progress Notes (Signed)
 Pt reports a severe decrease in vitamin K containing foods. Due to this there will not be a weekly change to dosing because pt has been stable on this dose.  Pt scheduled for BID ICV insertion on 3/13 and will be placed on a lovenox bridge. Lovenox dosing is 100 mg Q12H; will need 17 syringe  Hold dose today and then continue 1 tablet daily until starting instructions below. 3/8: Take last dose of warfarin 3/9: NO warfarin, NO Lovenox 3/10: NO warfarin, inject Lovenox every 12 hours 3/11: NO warfarin, inject Lovenox every 12 hours 3/12: NO warfarin, inject Lovenox ONCE IN THE AM  3/13: SURGERY; NO WARFARIN, NO LOVENOX  3/14: Take 1 1/2 tablets (4.5 mg) warfarin, inject Lovenox every 12 hours 3/15: Take 1 1/2 tablets (4.5 mg) warfarin, inject Lovenox every 12 hours 3/16: Take 1 1/2 tablets (4.5 mg) warfarin, inject Lovenox every 12 hours 3/17: Take 1 1/2 tablets (4.5 mg) warfarin, inject Lovenox every 12 hours 3/18: Take 1 tablet (3 mg) warfarin, inject Lovenox every 12 hours 3/19: Take 1 tablet (3 mg) warfarin, inject Lovenox every 12 hours 3/20: CHECK INR; NO WARFARIN AND NO LOVENOX UNTIL AFTER INR CHECK  Sent in Lovenox prescription.

## 2023-04-04 ENCOUNTER — Other Ambulatory Visit: Payer: Self-pay | Admitting: Family Medicine

## 2023-04-04 DIAGNOSIS — I4819 Other persistent atrial fibrillation: Secondary | ICD-10-CM | POA: Diagnosis not present

## 2023-04-04 DIAGNOSIS — I5020 Unspecified systolic (congestive) heart failure: Secondary | ICD-10-CM | POA: Diagnosis not present

## 2023-04-04 DIAGNOSIS — I4892 Unspecified atrial flutter: Secondary | ICD-10-CM | POA: Diagnosis not present

## 2023-04-04 DIAGNOSIS — Z79899 Other long term (current) drug therapy: Secondary | ICD-10-CM | POA: Diagnosis not present

## 2023-04-04 NOTE — Telephone Encounter (Signed)
 Call placed to patient to discuss upcoming procedure.   Confirmed patient is scheduled for a Biventricular implantable cardioverter defibrillator on Thursday, March 13 with Dr. Steffanie Dunn. Instructed patient to arrive at the Main Entrance A at [facility]: [address] [place], [place] [address] and check in at Admitting at 5:30 AM.   Any recent signs of acute illness or been started on antibiotics? Reports a flare up with allergies on last week- symptoms improved with Zyrtec and has since resolved.  Labs- plans to get drawn today  Any new medications started? No Diabetic medications to hold? Jardiance 3 days, Metformin morning of procedure Medication instructions:  On the morning of your procedure, you may take all other medications. Follow coumadin clinic instructions for Warfarin and Lovenox injections. No eating or drinking after midnight prior to procedure.   The night before your procedure and the morning of your procedure scrub your neck/chest with the CHG surgical soap.   Advised of plan to go home the same day and will only stay overnight if medically necessary. You MUST have a responsible adult to drive you home and MUST be with you the first 24 hours after you arrive home.  Patient verbalized understanding to all instructions provided and agreed to proceed with procedure.

## 2023-04-05 LAB — BASIC METABOLIC PANEL
BUN/Creatinine Ratio: 13 (ref 10–24)
BUN: 14 mg/dL (ref 8–27)
CO2: 21 mmol/L (ref 20–29)
Calcium: 8.8 mg/dL (ref 8.6–10.2)
Chloride: 103 mmol/L (ref 96–106)
Creatinine, Ser: 1.06 mg/dL (ref 0.76–1.27)
Glucose: 165 mg/dL — ABNORMAL HIGH (ref 70–99)
Potassium: 4.3 mmol/L (ref 3.5–5.2)
Sodium: 139 mmol/L (ref 134–144)
eGFR: 75 mL/min/{1.73_m2} (ref >59)

## 2023-04-05 LAB — CBC
Hematocrit: 46.3 % (ref 37.5–51.0)
Hemoglobin: 15.5 g/dL (ref 13.0–17.7)
MCH: 32.3 pg (ref 26.6–33.0)
MCHC: 33.5 g/dL (ref 31.5–35.7)
MCV: 97 fL (ref 79–97)
Platelets: 177 10*3/uL (ref 150–450)
RBC: 4.8 x10E6/uL (ref 4.14–5.80)
RDW: 13.1 % (ref 11.6–15.4)
WBC: 5 10*3/uL (ref 3.4–10.8)

## 2023-04-09 NOTE — Pre-Procedure Instructions (Signed)
 Attempted to call patient regarding procedure instructions.  Left voice mail on the on the following items: Arrival time 0515 Nothing to eat or drink after midnight No meds AM of procedure Responsible person to drive you home and stay with you for 24 hrs Wash with special soap night before and morning of procedure If on anti-coagulant drug instructions Coumadin- per instructions on Hold since 3/8.  Will check INR day of procedure.

## 2023-04-10 ENCOUNTER — Ambulatory Visit (HOSPITAL_COMMUNITY)
Admission: RE | Admit: 2023-04-10 | Discharge: 2023-04-10 | Disposition: A | Discharge status: Home / Self Care | Payer: Medicare PPO | Attending: Cardiology | Admitting: Cardiology

## 2023-04-10 ENCOUNTER — Encounter (HOSPITAL_COMMUNITY)
Admission: RE | Disposition: A | Discharge status: Home / Self Care | Payer: Self-pay | Source: Home / Self Care | Attending: Cardiology

## 2023-04-10 ENCOUNTER — Ambulatory Visit: Payer: Medicare PPO

## 2023-04-10 ENCOUNTER — Other Ambulatory Visit: Payer: Self-pay

## 2023-04-10 DIAGNOSIS — Z7901 Long term (current) use of anticoagulants: Secondary | ICD-10-CM

## 2023-04-10 DIAGNOSIS — I420 Dilated cardiomyopathy: Secondary | ICD-10-CM | POA: Diagnosis not present

## 2023-04-10 LAB — GLUCOSE, CAPILLARY: Glucose-Capillary: 113 mg/dL — ABNORMAL HIGH (ref 70–99)

## 2023-04-10 LAB — PROTIME-INR
INR: 1.5 — ABNORMAL HIGH (ref 0.8–1.2)
Prothrombin Time: 18.2 s — ABNORMAL HIGH (ref 11.4–15.2)

## 2023-04-10 SURGERY — BIV ICD INSERTION CRT-D
Anesthesia: LOCAL

## 2023-04-10 MED ORDER — SODIUM CHLORIDE 0.9 % IV SOLN: INTRAVENOUS | Status: DC

## 2023-04-10 MED ORDER — POVIDONE-IODINE 10 % EX SWAB
2.0000 | Freq: Once | CUTANEOUS | Status: AC
Start: 2023-04-10 — End: 2023-04-10
  Administered 2023-04-10: 2 via TOPICAL

## 2023-04-10 MED ORDER — SODIUM CHLORIDE 0.9 % IV SOLN: 80.0000 mg | INTRAVENOUS | Status: DC

## 2023-04-10 MED ORDER — CHLORHEXIDINE GLUCONATE 4 % EX SOLN
4.0000 | Freq: Once | CUTANEOUS | Status: AC
  Administered 2023-04-10: 4 via TOPICAL

## 2023-04-10 MED ORDER — LIDOCAINE HCL (PF) 1 % IJ SOLN
INTRAMUSCULAR | Status: AC
  Filled 2023-04-10: qty 90

## 2023-04-10 MED ORDER — SODIUM CHLORIDE 0.9 % IV SOLN
INTRAVENOUS | Status: AC
  Filled 2023-04-10: qty 2

## 2023-04-10 MED ORDER — VANCOMYCIN HCL 1500 MG/300ML IV SOLN
1500.0000 mg | INTRAVENOUS | Status: DC
  Filled 2023-04-10: qty 300

## 2023-04-10 NOTE — Progress Notes (Signed)
 This patient was suppost to have BIV ICD today, his PCP was managing his Coumadin/Lovenox. Today his INR was 1.5, which is subtherapeutic. Patient's procedure was canceled for today. Dr Lalla Brothers wants his INR rechecked on Monday 04/11/23 in cardiology coumadin clinic. Spoke with Clydie Braun pharmacist here at hospital she confirmed ok for patient to continue Lovenox BID as well as Coumadin until Monday's check.

## 2023-04-11 ENCOUNTER — Telehealth: Payer: Self-pay

## 2023-04-11 NOTE — Telephone Encounter (Signed)
 Left message for patient to call back to reschedule his ICD implant.   Patient was scheduled to have his procedure done yesterday however his INR was subtherapeutic. Per Dr. Lalla Brothers his INR needs to be 1.8-2.2. Patient is seeing coumadin clinic this coming Monday 3/17.

## 2023-04-14 ENCOUNTER — Ambulatory Visit: Attending: Cardiology | Admitting: *Deleted

## 2023-04-14 ENCOUNTER — Other Ambulatory Visit: Payer: Self-pay

## 2023-04-14 ENCOUNTER — Telehealth: Payer: Self-pay | Admitting: *Deleted

## 2023-04-14 DIAGNOSIS — I4891 Unspecified atrial fibrillation: Secondary | ICD-10-CM | POA: Diagnosis not present

## 2023-04-14 DIAGNOSIS — Z7901 Long term (current) use of anticoagulants: Secondary | ICD-10-CM

## 2023-04-14 DIAGNOSIS — Z952 Presence of prosthetic heart valve: Secondary | ICD-10-CM

## 2023-04-14 LAB — POCT INR: INR: 1.1 — AB (ref 2.0–3.0)

## 2023-04-14 MED ORDER — ENOXAPARIN SODIUM 100 MG/ML IJ SOSY: 100.0000 mg | PREFILLED_SYRINGE | Freq: Two times a day (BID) | INTRAMUSCULAR | 1 refills | Status: DC

## 2023-04-14 NOTE — Telephone Encounter (Signed)
 Wife called with husband on speaker phone and stated pt needs more lovenox injections because he has 4 syringes left, will sent 10 syringes/1 box (in the event he has to hold for upcoming ICD he will have some on hand) to requested pharmacy.   Also, she asked if INR can be checked the same day he has Dr. Antoine Poche and moved Anticoag Appt to that date. Advised INR may not increase by that time but will move per their needs. Wife verbalized understanding.   Also, advised them to call them main number to reach [place], RN since she has been trying to reach them regarding rescheduling procedure and wife states they will.

## 2023-04-14 NOTE — Patient Instructions (Addendum)
  Description   Continue lovenox injections twice a day. Today take 4.5mg  of warfarin and tomorrow take 4.5mg  of warfarin and 4.5mg  of warfarin Wednesday then resume taking warfarin 3mg  daily. Recheck INR on Friday. [facility] [phone]

## 2023-04-15 NOTE — Progress Notes (Unsigned)
  Cardiology Office Note:   Date:  04/17/2023  ID:  [name] [name], DOB 1950/05/26, MRN [id number] PCP: Joaquim Nam, MD  [facility] Providers Cardiologist:  Rollene Rotunda, MD Electrophysiologist:  Lanier Prude, MD {  History of Present Illness:   [name] [name] is a 73 y.o. male who presents for follow up after aortic valve replacement.   In June of this year he had a reduced EF compared with previous and I sent him for right and left heart Cath.  The valve function was normal but EF was now down to 25%.  Cath demonstrated minimal coronary disease.   Right heart pressures were normal.  He had persistent atrial fib thought to be related to the reduced EF.  He was cardioverted and is scheduled for ablation.  After cardioversion his ejection fraction was up to 35%.   Since I last saw him he was to have BiV ICD.  However, he had subtherapeutic INR and the case was delayed.    He is now on Lovenox bridge and trying to reschedule his device.  The patient denies any new symptoms such as chest discomfort, neck or arm discomfort. There has been no new shortness of breath, PND or orthopnea. There have been no reported palpitations, presyncope or syncope.  ROS: As stated in the HPI and negative for all other systems.  Studies Reviewed:    EKG:   NA  Risk Assessment/Calculations:              Physical Exam:   VS:  BP 128/60 (BP Location: Left Arm, Patient Position: Sitting, Cuff Size: Normal)   Pulse (!) 58   Ht 5\' 11"  (1.803 m)   Wt 231 lb (104.8 kg)   SpO2 97%   BMI 32.22 kg/m    Wt Readings from Last 3 Encounters:  04/17/23 231 lb (104.8 kg)  04/10/23 228 lb (103.4 kg)  03/12/23 228 lb 6 oz (103.6 kg)     GEN: Well nourished, well developed in no acute distress NECK: No JVD; No carotid bruits CARDIAC: RRR, brief systolic murmur, no diastolic murmurs, rubs, no gallops, mechanical s2 RESPIRATORY:  Clear to auscultation without rales, wheezing or rhonchi   ABDOMEN: Soft, non-tender, non-distended EXTREMITIES:  Mild leg edema; No deformity   ASSESSMENT AND PLAN:   AVR:   He had normal function on echo in Jan 2025.  He tolerates anticoagulation.   CARDIOMYOPATHY:   I am contacting the EP clinic to help him reschedule his ICD.  He will continue the meds as listed.   We are checking his INR today.  He will continue with Lovenox bridge.   HTN: The blood pressure is being managed in the context of treating his HF.   DM: His A1c is 6.8.  No change in therapy.    LBBB:   He has no symptoms related to bradycardia.  This is chronic.    ATRIAL FIB:   He tolerates anticoagulation.       Follow up with me in 6 months.    Signed, Rollene Rotunda, MD

## 2023-04-16 ENCOUNTER — Telehealth: Payer: Self-pay | Admitting: Cardiology

## 2023-04-16 NOTE — Telephone Encounter (Signed)
 Patient was returning phone call

## 2023-04-16 NOTE — Telephone Encounter (Signed)
 Patient is returning call to RN to reschedule procedure.   Please advise.

## 2023-04-17 ENCOUNTER — Ambulatory Visit: Payer: Medicare PPO | Attending: Cardiology | Admitting: Cardiology

## 2023-04-17 ENCOUNTER — Ambulatory Visit (INDEPENDENT_AMBULATORY_CARE_PROVIDER_SITE_OTHER)

## 2023-04-17 ENCOUNTER — Encounter: Payer: Self-pay | Admitting: Cardiology

## 2023-04-17 ENCOUNTER — Ambulatory Visit

## 2023-04-17 VITALS — BP 128/60 | HR 58 | Ht 71.0 in | Wt 231.0 lb

## 2023-04-17 DIAGNOSIS — E118 Type 2 diabetes mellitus with unspecified complications: Secondary | ICD-10-CM

## 2023-04-17 DIAGNOSIS — Z7901 Long term (current) use of anticoagulants: Secondary | ICD-10-CM | POA: Diagnosis not present

## 2023-04-17 DIAGNOSIS — Z952 Presence of prosthetic heart valve: Secondary | ICD-10-CM

## 2023-04-17 DIAGNOSIS — I10 Essential (primary) hypertension: Secondary | ICD-10-CM | POA: Diagnosis not present

## 2023-04-17 DIAGNOSIS — I4891 Unspecified atrial fibrillation: Secondary | ICD-10-CM | POA: Diagnosis not present

## 2023-04-17 DIAGNOSIS — I5020 Unspecified systolic (congestive) heart failure: Secondary | ICD-10-CM

## 2023-04-17 DIAGNOSIS — I420 Dilated cardiomyopathy: Secondary | ICD-10-CM

## 2023-04-17 LAB — POCT INR: INR: 1.1 — AB (ref 2.0–3.0)

## 2023-04-17 NOTE — Patient Instructions (Signed)
 Medication Instructions:  No changes.  *If you need a refill on your cardiac medications before your next appointment, please call your pharmacy*   Follow-Up: At [facility], you and your health needs are our priority.  As part of our continuing mission to provide you with exceptional heart care, we have created designated Provider Care Teams.  These Care Teams include your primary Cardiologist (physician) and Advanced Practice Providers (APPs -  Physician Assistants and Nurse Practitioners) who all work together to provide you with the care you need, when you need it.  We recommend signing up for the patient portal called "MyChart".  Sign up information is provided on this After Visit Summary.  MyChart is used to connect with patients for Virtual Visits (Telemedicine).  Patients are able to view lab/test results, encounter notes, upcoming appointments, etc.  Non-urgent messages can be sent to your provider as well.   To learn more about what you can do with MyChart, go to [email or web address].    Your next appointment:   6 month(s)  Provider:   Rollene Rotunda, MD     Other Instructions

## 2023-04-17 NOTE — Telephone Encounter (Signed)
 Pt has scheduled appt with Coumadin Clinic on Monday, 04/21/23. Note placed on appt to make Coumadin clinic aware INR goal is 1.8-2.2 for procedure on 04/25/23.

## 2023-04-17 NOTE — Patient Instructions (Signed)
 Description   Continue lovenox 100mg  injections twice a day. Take 5mg  of warfarin for the next 3 days, then resume taking warfarin 3mg  daily. Recheck INR on Monday. [facility] [phone]

## 2023-04-17 NOTE — Telephone Encounter (Signed)
 See previous phone note.

## 2023-04-17 NOTE — Telephone Encounter (Signed)
 Spoke with the patient and rescheduled his ICD implant for March 28th. Letter will be sent with updated instructions through MyChart. Will make coumadin clinic aware as he is being followed closely to make sure his INR is between 1.8-2.2 for his procedure.

## 2023-04-18 ENCOUNTER — Telehealth: Payer: Self-pay

## 2023-04-18 ENCOUNTER — Ambulatory Visit

## 2023-04-18 ENCOUNTER — Telehealth (HOSPITAL_COMMUNITY): Payer: Self-pay

## 2023-04-18 NOTE — Telephone Encounter (Signed)
 Pt missed coumadin clinic apt yesterday at [facility]. Review of chart reports pt has had INR checked at cardiology since last apt at [facility]. Surgery that was scheduled for 3/13 was RS to 3/28. Cardiology is providing Lovenox bridge instructions. Unsure if pt will return to [facility] after surgery or remain with cardiology coumadin clinic. Will leave anticoag encounters open at this time.

## 2023-04-18 NOTE — Telephone Encounter (Signed)
 Attempted to reach patient to discuss upcoming procedure, no answer. Left VM for patient to return call.

## 2023-04-21 ENCOUNTER — Ambulatory Visit: Attending: Family Medicine | Admitting: *Deleted

## 2023-04-21 DIAGNOSIS — Z7901 Long term (current) use of anticoagulants: Secondary | ICD-10-CM | POA: Diagnosis not present

## 2023-04-21 DIAGNOSIS — E119 Type 2 diabetes mellitus without complications: Secondary | ICD-10-CM | POA: Diagnosis not present

## 2023-04-21 LAB — POCT INR: POC INR: 2.4

## 2023-04-21 NOTE — Patient Instructions (Addendum)
 Description   STOP lovenox.  Take warfarin 1.5 mg today and tomorrow (Inr goal 1.8-2.2 for procedure on 3/28) Then resume taking warfarin 3mg  daily. Recheck INR  1 week post procedure. [facility] [phone]

## 2023-04-23 NOTE — Telephone Encounter (Addendum)
 Patient returned call to discuss upcoming procedure.   Confirmed patient is scheduled for a Biventricular implantable cardioverter defibrillator on Friday, March 28 with Dr. Steffanie Dunn. Instructed patient to arrive at the Main Entrance A at [facility]: [address] [place], [place] [address] and check in at Admitting at 12:30 PM.   Labs completed  Any recent signs of acute illness or been started on antibiotics?  No Any new medications started? No Any medications to hold? Jardiance x 3 days, Spironolactone AM of procedure Medication instructions:  On the morning of your procedure take all other morning medications with small sips of water. No eating or drinking after midnight prior to procedure.   The night before your procedure and the morning of your procedure scrub your neck/chest with the CHG surgical soap.   Advised of plan to go home the same day and will only stay overnight if medically necessary. You MUST have a responsible adult to drive you home and MUST be with you the first 24 hours after you arrive home.  Patient verbalized understanding to all instructions provided and agreed to proceed with procedure.

## 2023-04-24 NOTE — Pre-Procedure Instructions (Signed)
 Attempted to call patient regarding procedure instructions.  Left voicemail on the following items: Arrival time 1230 Nothing to eat or drink after midnight No meds AM of procedure Responsible person to drive you home and stay with you for 24 hrs  Have you missed any doses of anti-coagulant Coumadin- follow coumdin clinic instructions.

## 2023-04-25 ENCOUNTER — Other Ambulatory Visit (HOSPITAL_COMMUNITY): Payer: Self-pay

## 2023-04-25 ENCOUNTER — Other Ambulatory Visit: Payer: Self-pay

## 2023-04-25 ENCOUNTER — Ambulatory Visit (HOSPITAL_COMMUNITY)
Admission: RE | Admit: 2023-04-25 | Discharge: 2023-04-26 | Disposition: A | Discharge status: Home / Self Care | Attending: Cardiology | Admitting: Cardiology

## 2023-04-25 ENCOUNTER — Encounter (HOSPITAL_COMMUNITY)
Admission: RE | Disposition: A | Discharge status: Home / Self Care | Payer: Self-pay | Source: Home / Self Care | Attending: Cardiology

## 2023-04-25 DIAGNOSIS — I5042 Chronic combined systolic (congestive) and diastolic (congestive) heart failure: Secondary | ICD-10-CM | POA: Diagnosis present

## 2023-04-25 DIAGNOSIS — I484 Atypical atrial flutter: Secondary | ICD-10-CM | POA: Insufficient documentation

## 2023-04-25 DIAGNOSIS — Z952 Presence of prosthetic heart valve: Secondary | ICD-10-CM | POA: Diagnosis not present

## 2023-04-25 DIAGNOSIS — I5022 Chronic systolic (congestive) heart failure: Secondary | ICD-10-CM | POA: Insufficient documentation

## 2023-04-25 DIAGNOSIS — I428 Other cardiomyopathies: Secondary | ICD-10-CM | POA: Diagnosis not present

## 2023-04-25 DIAGNOSIS — I110 Hypertensive heart disease with heart failure: Secondary | ICD-10-CM | POA: Diagnosis not present

## 2023-04-25 DIAGNOSIS — Q2381 Bicuspid aortic valve: Secondary | ICD-10-CM | POA: Diagnosis not present

## 2023-04-25 DIAGNOSIS — E785 Hyperlipidemia, unspecified: Secondary | ICD-10-CM | POA: Diagnosis present

## 2023-04-25 DIAGNOSIS — K219 Gastro-esophageal reflux disease without esophagitis: Secondary | ICD-10-CM | POA: Diagnosis not present

## 2023-04-25 DIAGNOSIS — I447 Left bundle-branch block, unspecified: Secondary | ICD-10-CM | POA: Insufficient documentation

## 2023-04-25 DIAGNOSIS — Z7901 Long term (current) use of anticoagulants: Secondary | ICD-10-CM | POA: Diagnosis not present

## 2023-04-25 DIAGNOSIS — E119 Type 2 diabetes mellitus without complications: Secondary | ICD-10-CM

## 2023-04-25 DIAGNOSIS — I4819 Other persistent atrial fibrillation: Secondary | ICD-10-CM | POA: Insufficient documentation

## 2023-04-25 DIAGNOSIS — I10 Essential (primary) hypertension: Secondary | ICD-10-CM | POA: Diagnosis present

## 2023-04-25 DIAGNOSIS — Z7984 Long term (current) use of oral hypoglycemic drugs: Secondary | ICD-10-CM | POA: Diagnosis not present

## 2023-04-25 HISTORY — PX: LEAD INSERTION: EP1212

## 2023-04-25 HISTORY — PX: BIV ICD INSERTION CRT-D: EP1195

## 2023-04-25 LAB — PROTIME-INR
INR: 2.5 — ABNORMAL HIGH (ref 0.8–1.2)
Prothrombin Time: 27 s — ABNORMAL HIGH (ref 11.4–15.2)

## 2023-04-25 LAB — GLUCOSE, CAPILLARY
Glucose-Capillary: 93 mg/dL (ref 70–99)
Glucose-Capillary: 86 mg/dL (ref 70–99)
Glucose-Capillary: 133 mg/dL — ABNORMAL HIGH (ref 70–99)

## 2023-04-25 SURGERY — BIV ICD INSERTION CRT-D
Anesthesia: LOCAL

## 2023-04-25 MED ORDER — CHLORHEXIDINE GLUCONATE 4 % EX SOLN
4.0000 | Freq: Once | CUTANEOUS | Status: DC
Start: 2023-04-25 — End: 2023-04-25
  Filled 2023-04-25: qty 60

## 2023-04-25 MED ORDER — FENTANYL CITRATE (PF) 100 MCG/2ML IJ SOLN
INTRAMUSCULAR | Status: DC | PRN
  Administered 2023-04-25: 25 ug via INTRAVENOUS

## 2023-04-25 MED ORDER — ACETAMINOPHEN 500 MG PO TABS
ORAL_TABLET | ORAL | Status: AC
  Filled 2023-04-25: qty 2

## 2023-04-25 MED ORDER — LIDOCAINE HCL (PF) 1 % IJ SOLN
INTRAMUSCULAR | Status: DC | PRN
  Administered 2023-04-25: 50 mL

## 2023-04-25 MED ORDER — WARFARIN - PHYSICIAN DOSING INPATIENT: Freq: Every day | Status: DC

## 2023-04-25 MED ORDER — PRAVASTATIN SODIUM 10 MG PO TABS
20.0000 mg | ORAL_TABLET | Freq: Every day | ORAL | Status: DC
  Administered 2023-04-26: 20 mg via ORAL
  Filled 2023-04-25: qty 2

## 2023-04-25 MED ORDER — MIDAZOLAM HCL 5 MG/5ML IJ SOLN
INTRAMUSCULAR | Status: DC | PRN
  Administered 2023-04-25: 1 mg via INTRAVENOUS

## 2023-04-25 MED ORDER — PANTOPRAZOLE SODIUM 40 MG PO TBEC
40.0000 mg | DELAYED_RELEASE_TABLET | Freq: Every day | ORAL | Status: DC
  Administered 2023-04-26: 40 mg via ORAL
  Filled 2023-04-25: qty 1

## 2023-04-25 MED ORDER — ACETAMINOPHEN 500 MG PO TABS
1000.0000 mg | ORAL_TABLET | Freq: Once | ORAL | Status: AC
  Administered 2023-04-25: 1000 mg via ORAL

## 2023-04-25 MED ORDER — SACUBITRIL-VALSARTAN 97-103 MG PO TABS
1.0000 | ORAL_TABLET | Freq: Two times a day (BID) | ORAL | Status: DC
  Administered 2023-04-25 – 2023-04-26 (×2): 1 via ORAL
  Filled 2023-04-25 (×2): qty 1

## 2023-04-25 MED ORDER — SODIUM CHLORIDE 0.9 % IV SOLN
INTRAVENOUS | Status: AC
  Administered 2023-04-25: 80 mg
  Filled 2023-04-25: qty 2

## 2023-04-25 MED ORDER — FENTANYL CITRATE (PF) 100 MCG/2ML IJ SOLN
INTRAMUSCULAR | Status: AC
  Filled 2023-04-25: qty 2

## 2023-04-25 MED ORDER — SODIUM CHLORIDE 0.9 % IV SOLN: 80.0000 mg | INTRAVENOUS | Status: AC

## 2023-04-25 MED ORDER — ACETAMINOPHEN 325 MG PO TABS
325.0000 mg | ORAL_TABLET | ORAL | Status: DC | PRN
  Administered 2023-04-25: 325 mg via ORAL
  Administered 2023-04-26: 650 mg via ORAL
  Filled 2023-04-25 (×2): qty 2

## 2023-04-25 MED ORDER — ONDANSETRON HCL 4 MG/2ML IJ SOLN: 4.0000 mg | Freq: Four times a day (QID) | INTRAMUSCULAR | Status: DC | PRN

## 2023-04-25 MED ORDER — CEFAZOLIN SODIUM-DEXTROSE 2-4 GM/100ML-% IV SOLN: 2.0000 g | Freq: Once | INTRAVENOUS | Status: AC

## 2023-04-25 MED ORDER — CARVEDILOL 3.125 MG PO TABS
3.1250 mg | ORAL_TABLET | Freq: Two times a day (BID) | ORAL | Status: DC
  Administered 2023-04-25 – 2023-04-26 (×2): 3.125 mg via ORAL
  Filled 2023-04-25 (×2): qty 1

## 2023-04-25 MED ORDER — POVIDONE-IODINE 10 % EX SWAB
2.0000 | Freq: Once | CUTANEOUS | Status: AC
  Administered 2023-04-25: 2 via TOPICAL

## 2023-04-25 MED ORDER — VANCOMYCIN HCL 1500 MG/300ML IV SOLN
1500.0000 mg | INTRAVENOUS | Status: DC
  Filled 2023-04-25: qty 300

## 2023-04-25 MED ORDER — AMIODARONE HCL 200 MG PO TABS
200.0000 mg | ORAL_TABLET | Freq: Every day | ORAL | Status: DC
  Administered 2023-04-26: 200 mg via ORAL
  Filled 2023-04-25: qty 1

## 2023-04-25 MED ORDER — CEFAZOLIN SODIUM-DEXTROSE 2-4 GM/100ML-% IV SOLN
INTRAVENOUS | Status: AC
  Administered 2023-04-25: 2 g via INTRAVENOUS
  Filled 2023-04-25: qty 100

## 2023-04-25 MED ORDER — WARFARIN SODIUM 2.5 MG PO TABS
2.5000 mg | ORAL_TABLET | Freq: Every day | ORAL | Status: DC
  Administered 2023-04-25: 2.5 mg via ORAL
  Filled 2023-04-25: qty 1

## 2023-04-25 MED ORDER — SODIUM CHLORIDE 0.9 % IV SOLN: INTRAVENOUS | Status: DC

## 2023-04-25 MED ORDER — HEPARIN (PORCINE) IN NACL 1000-0.9 UT/500ML-% IV SOLN
INTRAVENOUS | Status: DC | PRN
  Administered 2023-04-25: 500 mL

## 2023-04-25 MED ORDER — IOHEXOL 350 MG/ML SOLN
INTRAVENOUS | Status: DC | PRN
  Administered 2023-04-25: 15 mL
  Administered 2023-04-25: 10 mL

## 2023-04-25 MED ORDER — MIDAZOLAM HCL 5 MG/5ML IJ SOLN
INTRAMUSCULAR | Status: AC
  Filled 2023-04-25: qty 5

## 2023-04-25 MED ORDER — EMPAGLIFLOZIN 10 MG PO TABS
10.0000 mg | ORAL_TABLET | Freq: Every day | ORAL | Status: DC
  Administered 2023-04-26: 10 mg via ORAL
  Filled 2023-04-25: qty 1

## 2023-04-25 MED ORDER — LIDOCAINE HCL 1 % IJ SOLN
INTRAMUSCULAR | Status: AC
  Filled 2023-04-25: qty 60

## 2023-04-25 MED ORDER — INSULIN ASPART 100 UNIT/ML IJ SOLN
0.0000 [IU] | Freq: Three times a day (TID) | INTRAMUSCULAR | Status: DC
  Administered 2023-04-26 (×2): 2 [IU] via SUBCUTANEOUS

## 2023-04-25 MED ORDER — SPIRONOLACTONE 25 MG PO TABS
25.0000 mg | ORAL_TABLET | Freq: Every day | ORAL | Status: DC
  Administered 2023-04-26: 25 mg via ORAL
  Filled 2023-04-25: qty 1

## 2023-04-25 SURGICAL SUPPLY — 19 items
CABLE SURGICAL S-101-97-12 (CABLE) ×1 IMPLANT
CATH CPS DIRECT 135 DS2C020 (CATHETERS) IMPLANT
CATH CPS LOCATOR 3D MED XLNG (CATHETERS) IMPLANT
ICD GALLANT HFCRTD CDHFA500Q (ICD Generator) IMPLANT
KIT ESSENTIALS PG (KITS) IMPLANT
LEAD DURATA 7122Q-65CM (Lead) IMPLANT
LEAD QUARTET 1458Q-86CM (Lead) IMPLANT
LEAD ULTIPACE 52 LPA1231/52 (Lead) IMPLANT
MAT PREVALON FULL STRYKER (MISCELLANEOUS) IMPLANT
PAD DEFIB RADIO PHYSIO CONN (PAD) ×1 IMPLANT
POUCH AIGIS-R ANTIBACT ICD (Mesh General) ×1 IMPLANT
POUCH AIGIS-R ANTIBACT ICD LRG (Mesh General) IMPLANT
SHEATH 7FR PRELUDE SNAP 13 (SHEATH) IMPLANT
SHEATH 9.5FR PRELUDE SNAP 13 (SHEATH) IMPLANT
SHEATH PROBE COVER 6X72 (BAG) IMPLANT
SLITTER AGILIS HISPRO (INSTRUMENTS) IMPLANT
TRAY PACEMAKER INSERTION (PACKS) ×1 IMPLANT
WIRE ACUITY WHISPER EDS 4648 (WIRE) IMPLANT
WIRE HI TORQ VERSACORE-J 145CM (WIRE) IMPLANT

## 2023-04-25 NOTE — H&P (Signed)
 Electrophysiology Office Note:     Date:  04/25/2023    ID:  [name] [name], DOB December 02, 1950, MRN [id number]   [facility] Cardiologist:  Rollene Rotunda, MD  [facility] Electrophysiologist:  Lanier Prude, MD    Referring MD: Laurey Morale, MD    Chief Complaint: AF   History of Present Illness:     [name] [name] is a 73 y.o. malewho I am seeing today for an evaluation of AFat the request of Dr Shirlee Latch.   The patient was last seen by Dr Shirlee Latch on 08/09/2022.   The patient has a medical history that includes bicuspid AV s/p mechanical AVR, pAF/AFL, chronic systolic heart failure. His AVR was in 2011.    In June of this year he was admitted with a newly reduced ejection fraction of 25 to 30%.  He was in an atypical atrial flutter.  Amiodarone was started.   He is doing well today.  He can tell that he is in normal rhythm.  He feels intense palpitations when his heart is out of rhythm.  Presents for CRT-D today.      Objective Their past medical, social and family history was reveiwed.     ROS:   Please see the history of present illness.    All other systems reviewed and are negative.   EKGs/Labs/Other Studies Reviewed:     The following studies were reviewed today:   September 06, 2022 echo EF 30 to 35% Moderate to severely reduced LV function Global hypokinesis RV function low normal Mild MR   July 23, 2022 echo EF 25 to 30% Global hypokinesis RV function mildly reduced   February 16, 2021 echo EF 45 to 50% RV function normal Moderately dilated left atrium    EKG Interpretation Date/Time:                  Thursday September 12 2022 10:12:07 EDT Ventricular Rate:         61 PR Interval:                 230 QRS Duration:             146 QT Interval:                 456 QTC Calculation:459 R Axis:                         -47   Text Interpretation:Sinus rhythm with 1st degree A-V block Left axis deviation Left bundle branch block Confirmed by  Steffanie Dunn [phone]) on 09/12/2022 10:13:10 AM      Physical Exam:     VS:  BP 145/44 (BP Location: Left Arm, Patient Position: Sitting, Cuff Size: Large)   Pulse 49   Ht 5\' 11"  (1.803 m)   Wt 226 lb 9.6 oz (102.8 kg)   SpO2 97%   BMI 31.60 kg/m         Wt Readings from Last 3 Encounters:  09/12/22 226 lb 9.6 oz (102.8 kg)  08/09/22 226 lb (102.5 kg)  07/30/22 232 lb (105.2 kg)      GEN:  Well nourished, well developed in no acute distress CARDIAC: RRR, no murmurs, rubs, gallops.  Crisp mechanical click of the aortic valve. RESPIRATORY:  Clear to auscultation without rales, wheezing or rhonchi      Assessment ASSESSMENT AND PLAN:     1. Persistent atrial fibrillation (HCC)   2. Atrial  flutter, unspecified type (HCC)   3. Encounter for long-term (current) use of high-risk medication   4. HFrEF (heart failure with reduced ejection fraction) (HCC)       #Persistent atrial fibrillation and flutter #High risk med monitoring-amiodarone Maintaining sinus rhythm on amiodarone.  His ejection fraction worsened in the setting of atrial fibrillation/flutter.  Rhythm control indicated. Continue Coumadin (mechanical aortic valve)    #Chronic systolic heart failure #Left bundle branch block NYHA class II-III.  EF 25 to 30% on most recent echocardiogram.  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 Continue GDMT, Valla Leaver, spironolactone   Presents for CRT-D today.   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  He is referred by Dr Antoine Poche for risk stratification of sudden death and consideration of ICD implantation.  At this time, he meets criteria for ICD implantation for primary prevention of sudden death.  I have had a thorough discussion with the patient reviewing options.  The patient and their family (if available) have had opportunities to ask questions and have them answered. The patient and I  have decided together through a shared decision making process to proceed with ICD implant at this time.    Risks, benefits, alternatives to ICD implantation were discussed in detail with the patient today. The patient understands that the risks include but are not limited to bleeding, infection, pneumothorax, perforation, tamponade, vascular damage, renal failure, MI, stroke, death, inappropriate shocks, and lead dislodgement and wishes to proceed.  We will therefore schedule device implantation at the next available time.     Signed, Rossie Muskrat. Lalla Brothers, MD, [facility], [facility] 04/25/2023 Electrophysiology [facility]

## 2023-04-25 NOTE — Discharge Instructions (Addendum)
 Keep the large bulky dressing in place, do not remove Do not get wet Follow arm instructions below You are scheduled to have the dressing removed at the office on Monday 04/28/23 @ 10:30AM Then you will follow the wound care instructions below/or as directed by the staff at that time    After Your ICD (Implantable Cardiac Defibrillator)   You have a Abbott ICD  ACTIVITY Do not lift your arm above shoulder height for 1 week after your procedure. After 7 days, you may progress as below.  You should remove your sling 24 hours after your procedure, unless otherwise instructed by your provider.     Friday May 02, 2023  Saturday May 03, 2023 Sunday May 04, 2023 Monday May 05, 2023   Do not lift, push, pull, or carry anything over 10 pounds with the affected arm until 6 weeks (Friday Jun 06, 2023 ) after your procedure.   You may drive AFTER your wound check, unless you have been told otherwise by your provider.   Ask your healthcare provider when you can go back to work   INCISION/Dressing If you are on a blood thinner such as Coumadin, Xarelto, Eliquis, Plavix, or Pradaxa please confirm with your provider when this should be resumed.   If large square, outer bandage is left in place, this can be removed after 24 hours from your procedure. Do not remove steri-strips or glue as below.   Monitor your defibrillator site for redness, swelling, and drainage. Call the device clinic at [phone] if you experience these symptoms or fever/chills.  If your incision is sealed with Steri-strips or staples, you may shower 7 days after your procedure or when told by your provider. Do not remove the steri-strips or let the shower hit directly on your site. You may wash around your site with soap and water.    If you were discharged in a sling, please do not wear this during the day more than 48 hours after your surgery unless otherwise instructed. This may increase the risk of stiffness and  soreness in your shoulder.   Avoid lotions, ointments, or perfumes over your incision until it is well-healed.  You may use a hot tub or a pool AFTER your wound check appointment if the incision is completely closed.  Your ICD is designed to protect you from life threatening heart rhythms. Because of this, you may receive a shock.   1 shock with no symptoms:  Call the office during business hours. 1 shock with symptoms (chest pain, chest pressure, dizziness, lightheadedness, shortness of breath, overall feeling unwell):  Call 911. If you experience 2 or more shocks in 24 hours:  Call 911. If you receive a shock, you should not drive for 6 months per the [place] DMV IF you receive appropriate therapy from your ICD.   ICD Alerts:  Some alerts are vibratory and others beep. These are NOT emergencies. Please call our office to let us know. If this occurs at night or on weekends, it can wait until the next business day. Send a remote transmission.  If your device is capable of reading fluid status (for heart failure), you will be offered monthly monitoring to review this with you.   DEVICE MANAGEMENT Remote monitoring is used to monitor your ICD from home. This monitoring is scheduled every 91 days by our office. It allows [place] to keep an eye on the functioning of your device to ensure it is working properly. You will routinely see your  Electrophysiologist annually (more often if necessary).   You should receive your ID card for your new device in 4-8 weeks. Keep this card with you at all times once received. Consider wearing a medical alert bracelet or necklace.  Your ICD  may be MRI compatible. This will be discussed at your next office visit/wound check.  You should avoid contact with strong electric or magnetic fields.   Do not use amateur (ham) radio equipment or electric (arc) welding torches. MP3 player headphones with magnets should not be used. Some devices are safe to use if held at least 12  inches (30 cm) from your defibrillator. These include power tools, lawn mowers, and speakers. If you are unsure if something is safe to use, ask your health care provider.  When using your cell phone, hold it to the ear that is on the opposite side from the defibrillator. Do not leave your cell phone in a pocket over the defibrillator.  You may safely use electric blankets, heating pads, computers, and microwave ovens.  Call the office right away if: You have chest pain. You feel more than one shock. You feel more short of breath than you have felt before. You feel more light-headed than you have felt before. Your incision starts to open up.  This information is not intended to replace advice given to you by your health care provider. Make sure you discuss any questions you have with your health care provider.

## 2023-04-25 NOTE — Plan of Care (Signed)
  Problem: Health Behavior/Discharge Planning: Goal: Ability to manage health-related needs will improve Outcome: Progressing   Problem: Pain Managment: Goal: General experience of comfort will improve and/or be controlled Outcome: Progressing   Problem: Safety: Goal: Ability to remain free from injury will improve Outcome: Progressing

## 2023-04-26 ENCOUNTER — Ambulatory Visit (HOSPITAL_COMMUNITY)

## 2023-04-26 DIAGNOSIS — I5022 Chronic systolic (congestive) heart failure: Secondary | ICD-10-CM | POA: Diagnosis not present

## 2023-04-26 DIAGNOSIS — I447 Left bundle-branch block, unspecified: Secondary | ICD-10-CM | POA: Insufficient documentation

## 2023-04-26 DIAGNOSIS — Z7901 Long term (current) use of anticoagulants: Secondary | ICD-10-CM | POA: Diagnosis not present

## 2023-04-26 DIAGNOSIS — Q2381 Bicuspid aortic valve: Secondary | ICD-10-CM | POA: Diagnosis not present

## 2023-04-26 DIAGNOSIS — I428 Other cardiomyopathies: Secondary | ICD-10-CM

## 2023-04-26 DIAGNOSIS — E119 Type 2 diabetes mellitus without complications: Secondary | ICD-10-CM | POA: Diagnosis not present

## 2023-04-26 DIAGNOSIS — I484 Atypical atrial flutter: Secondary | ICD-10-CM | POA: Diagnosis not present

## 2023-04-26 DIAGNOSIS — I110 Hypertensive heart disease with heart failure: Secondary | ICD-10-CM | POA: Diagnosis not present

## 2023-04-26 DIAGNOSIS — I4819 Other persistent atrial fibrillation: Secondary | ICD-10-CM | POA: Diagnosis not present

## 2023-04-26 DIAGNOSIS — R9389 Abnormal findings on diagnostic imaging of other specified body structures: Secondary | ICD-10-CM | POA: Diagnosis not present

## 2023-04-26 LAB — GLUCOSE, CAPILLARY
Glucose-Capillary: 129 mg/dL — ABNORMAL HIGH (ref 70–99)
Glucose-Capillary: 128 mg/dL — ABNORMAL HIGH (ref 70–99)

## 2023-04-26 LAB — PROTIME-INR
INR: 2.4 — ABNORMAL HIGH (ref 0.8–1.2)
Prothrombin Time: 26.5 s — ABNORMAL HIGH (ref 11.4–15.2)

## 2023-04-26 NOTE — Plan of Care (Signed)
  Problem: Education: Goal: Knowledge of General Education information will improve Description: Including pain rating scale, medication(s)/side effects and non-pharmacologic comfort measures Outcome: Progressing   Problem: Clinical Measurements: Goal: Ability to maintain clinical measurements within normal limits will improve Outcome: Progressing Goal: Will remain free from infection Outcome: Progressing Goal: Respiratory complications will improve Outcome: Progressing   Problem: Coping: Goal: Level of anxiety will decrease Outcome: Progressing   Problem: Elimination: Goal: Will not experience complications related to urinary retention Outcome: Progressing   Problem: Pain Managment: Goal: General experience of comfort will improve and/or be controlled Outcome: Progressing

## 2023-04-26 NOTE — Progress Notes (Signed)
   Patient Name: [name] [name] Date of Encounter: 04/26/2023 [facility] Cardiologist: Rollene Rotunda, MD   Interval Summary  .    No acute overnight events. Reports doing relatively well. L chest soreness manageable. No new or acute complaints.  Vital Signs .    Vitals:   04/26/23 0300 04/26/23 0400 04/26/23 0500 04/26/23 0823  BP: 132/75  122/68 123/64  Pulse: 60 60 60 63  Resp: 14 15 19 12   Temp:   97.7 F (36.5 C) 97.6 F (36.4 C)  TempSrc:   Oral Oral  SpO2: 96% 94% 94% 98%  Weight:   103.6 kg   Height:        Intake/Output Summary (Last 24 hours) at 04/26/2023 1153 Last data filed at 04/26/2023 0824 Gross per 24 hour  Intake 712.5 ml  Output 1850 ml  Net -1137.5 ml      04/26/2023    5:00 AM 04/25/2023   12:09 PM 04/17/2023    8:44 AM  Last 3 Weights  Weight (lbs) 228 lb 6.3 oz 228 lb 231 lb  Weight (kg) 103.6 kg 103.42 kg 104.781 kg      Telemetry/ECG    BiV paced - Personally Reviewed  Physical Exam .   GEN: No acute distress.   Neck: No JVD Cardiac: Normal rate, regular rhythm. Left chest pressure dressing in place. Respiratory: Clear to auscultation bilaterally. GI: Soft, nontender, non-distended  MS: No edema  Assessment & Plan .     #S/p CRT-D #Chronic systolic heart failure: NYHA Class III. EF 25-30%. #Left bundle branch block -CXR without pneumothorax. -Device check with appropriate function and stable lead parameters.  -ECG with an appropriate BiV paced morphology. -Discharge teaching regarding wound care and activity restrictions provided.  -Continue GDMT, Entresto, Jardiance, spironolactone. -Return to device clinic on Monday for removal of pressure dressing.    #Mechanical aortic valve -Continue coumadin. Leave pressure dressing in place until Monday.   #Persistent atrial fibrillation and flutter #High risk med monitoring-amiodarone -Continue amiodarone. -Continue Coumadin (mechanical aortic valve).    For questions  or updates, please contact [facility] Please consult [email or web address] for contact info under        Signed, Nobie Putnam, MD

## 2023-04-26 NOTE — Discharge Summary (Cosign Needed)
 ELECTROPHYSIOLOGY PROCEDURE DISCHARGE SUMMARY    Patient ID: [name] [name],  MRN: [id number], DOB/AGE: [birth year] 73 y.o.  Admit date: 04/25/2023 Discharge date: 04/26/2023  Primary Care Physician: Dr. Crawford Givens Primary Cardiologist: Dr. Rollene Rotunda and Dr. Marca Ancona Electrophysiologist: Dr. Steffanie Dunn  Primary Discharge Diagnosis:  Non-ischemic cardiomyopathy/ chronic HFrEF LBBB  Secondary Discharge Diagnosis:  Persistent atrial fibrillation/ flutter Bicuspid aortic valve with aortic insufficiency s/p mechanical AVR in 2011 Hypertension Dyslipidemia Type 2 diabetes mellitus GERD  Allergies  Allergen Reactions   Ace Inhibitors Cough   Aspirin Other (See Comments)    Gastritis.    Other     dexcom patch- local rash- presumed adhesive allergy.     Primaxin [Imipenem W/Cilastatin Sodium]     Rash- but able to take amoxil w/o troubles.    Tape     Rash from bandaids.       Procedures This Admission:  1.  Implantation of a Abbott CRT-D on 04/25/2023 by Dr. Lalla Brothers.    2.  CXR on 04/26/2023 demonstrated no pneumothorax status post device implantation.   Brief HPI: [name] [name] is a 73 y.o. male was referred to electrophysiology in the outpatient setting for consideration of CRT-D implantation.  Past medical history includes non-ischemic cardiomyopathy/ chronic HFrEF with EF of 30-35%, LBBB, persistent atrial fibrillation/ flutter, bicuspid aortic valve with aortic insufficiency s/p mechanical AVR, hypertension, dyslipidemia, type 2 diabetes, and GERD.  The patient has persistent LV dysfunction despite guideline directed therapy.  Risks, benefits, and alternatives to ICD implantation were reviewed with the patient who wished to proceed.   Hospital Course:  The patient was admitted and underwent implantation of a CRT-D with details as outlined above. A pressure dressing was applied given a therapeutic INR. Estimated blood loss <10 mL. There were no  immediate post procedure complications. He was monitored on telemetry overnight which demonstrated BiV pacing.  Left chest was without hematoma or ecchymosis. The device was interrogated and found to be functioning normally.  CXR was obtained and demonstrated no pneumothorax status post device implantation.  Wound care, arm mobility, and restrictions were reviewed with the patient.  He was examined by Dr. Jimmey Ralph today and determined to be stable for discharge. Pressure dressing will be removed in clinic on Monday 04/28/2023.   INR 2.4 on day of discharge. He was given a slightly lower dosing of Coumadin yesterday evening (2.5mg ) after procedure. Discussed with Dr. Jimmey Ralph and will resume home Coumadin schedule at discharge. He already has an appointment in the Coumadin Clinic scheduled for 05/02/2023.  The patient's discharge medications include Entresto, Coreg, Spironolactone, Jardiance, Amiodarone, and Warfarin as below.  Physical Exam: Vitals:   04/26/23 0400 04/26/23 0500 04/26/23 0823 04/26/23 1227  BP:  122/68 123/64 131/67  Pulse: 60 60 63 61  Resp: 15 19 12 19   Temp:  97.7 F (36.5 C) 97.6 F (36.4 C) 97.8 F (36.6 C)  TempSrc:  Oral Oral Oral  SpO2: 94% 94% 98% 99%  Weight:  103.6 kg    Height:        Labs:   Lab Results  Component Value Date   WBC 5.0 04/04/2023   HGB 15.5 04/04/2023   HCT 46.3 04/04/2023   MCV 97 04/04/2023   PLT 177 04/04/2023   No results for input(s): "NA", "K", "CL", "CO2", "BUN", "CREATININE", "CALCIUM", "PROT", "BILITOT", "ALKPHOS", "ALT", "AST", "GLUCOSE" in the last 168 hours.  Invalid input(s): "LABALBU"  Discharge Medications:  Allergies as of  04/26/2023       Reactions   Ace Inhibitors Cough   Aspirin Other (See Comments)   Gastritis.    Other    dexcom patch- local rash- presumed adhesive allergy.     Primaxin [imipenem W/cilastatin Sodium]    Rash- but able to take amoxil w/o troubles.    Tape    Rash from bandaids.           Medication List     PAUSE taking these medications    metFORMIN 500 MG tablet Wait to take this until: April 28, 2023 Commonly known as: GLUCOPHAGE TAKE TWO TABLETS BY MOUTH TWO TIMES DAILY       STOP taking these medications    enoxaparin 100 MG/ML injection Commonly known as: LOVENOX       TAKE these medications    Accu-Chek Aviva Plus test strip Generic drug: glucose blood USE AS DIRECTED TO CHECK BLOOD SUGAR TWO TO FOUR TIMES A DAY   Accu-Chek FastClix Lancet Kit AS DIRECTED   Accu-Chek FastClix Lancets Misc USE AS DIRECTED TO CHECK BLOOD SUGAR TWICE DAILY   amiodarone 200 MG tablet Commonly known as: PACERONE TAKE ONE TABLET (200 MG TOTAL) BY MOUTH DAILY.   azelastine 0.1 % nasal spray Commonly known as: ASTELIN Place 2 sprays into both nostrils 2 (two) times daily as needed for rhinitis. Use in each nostril as directed   carvedilol 3.125 MG tablet Commonly known as: COREG TAKE ONE TABLET (3.125 MG TOTAL) BY MOUTH TWO TIMES DAILY WITH A MEAL.   cetirizine 10 MG tablet Commonly known as: ZYRTEC Take 10 mg by mouth daily as needed for allergies.   empagliflozin 10 MG Tabs tablet Commonly known as: Jardiance Take 1 tablet (10 mg total) by mouth daily.   FIBER GUMMIES PO Take 1 capsule by mouth daily.   fluocinonide cream 0.05 % Commonly known as: LIDEX APPLY 1 APPLICATION TO AFFECTEDE AREAS 2 TIMES DAILY AS NEEDED   glipiZIDE 5 MG tablet Commonly known as: GLUCOTROL 1 tab if AM sugar is above 150, 2 tabs if AM sugar is above 175. If sugar is below 150, skip dose.   pantoprazole 40 MG tablet Commonly known as: PROTONIX Take 40 mg by mouth daily.   pravastatin 20 MG tablet Commonly known as: PRAVACHOL TAKE ONE TABLET BY MOUTH ONCE A DAY   sacubitril-valsartan 97-103 MG Commonly known as: ENTRESTO Take 1 tablet by mouth 2 (two) times daily.   spironolactone 25 MG tablet Commonly known as: ALDACTONE Take 1 tablet (25 mg total) by mouth  daily.   tamsulosin 0.4 MG Caps capsule Commonly known as: FLOMAX Take 2 capsules (0.8 mg total) by mouth daily.   warfarin 3 MG tablet Commonly known as: COUMADIN Take as directed. If you are unsure how to take this medication, talk to your nurse or doctor. Original instructions: TAKE ONE TABLET BY MOUTH ON SUNDAY, TUESDAY, WEDNESDAY, THURSDAY, AND SATURDAY OR AS DIRECTED BY ANTICOAGULATION CLINIC        Disposition:  Discharge Instructions     Diet - low sodium heart healthy   Complete by: As directed    Increase activity slowly   Complete by: As directed        Follow-up Information     Graciella Freer, PA-C Follow up.   Specialty: Cardiology Why: 04/28/23 @ 10:30AM to remove the pressure dressing Contact information: [address] [place] [address] [phone]  Duration of Discharge Encounter: Greater than 30 minutes including physician time.  Signed, Marjie Skiff, PA-C 04/26/2023 1:23 PM  I have seen, examined the patient, and reviewed the above assessment and plan.    Hospital Course and Plan: [name] [name] is a 73 year old male with past medical history of chronic systolic heart failure and left bundle branch block who underwent CRT-D implant on 3/28.  Patient was kept overnight for routine monitoring.  There were no acute complications.  Chest x-ray was performed with appropriate lead position and no pneumothorax.  Bedside device interrogation was performed with appropriate device function and stable lead parameters.  Routine discharge teaching regarding activity restrictions and wound care were provided.  Due to patient's mechanical valve, he remained on warfarin.  Pressure dressing was left in place for this reason.  He will return on 4/1 to device clinic for pressure dressing removal.  GEN: No acute distress.   Neck: No JVD Cardiac: Normal rate, regular rhythm. Left chest pressure dressing in  place. Respiratory: Clear to auscultation bilaterally. GI: Soft, nontender, non-distended  MS: No edema   Nobie Putnam, MD 05/01/2023 8:48 PM

## 2023-04-28 ENCOUNTER — Ambulatory Visit: Attending: Student | Admitting: Student

## 2023-04-28 ENCOUNTER — Encounter (HOSPITAL_COMMUNITY): Payer: Self-pay | Admitting: Cardiology

## 2023-04-28 DIAGNOSIS — Z95810 Presence of automatic (implantable) cardiac defibrillator: Secondary | ICD-10-CM

## 2023-04-28 DIAGNOSIS — Z7901 Long term (current) use of anticoagulants: Secondary | ICD-10-CM

## 2023-04-28 NOTE — Progress Notes (Signed)
 Wound check;   Pressure dressing remove without incident. Mild ecchymosis into axilla but no hematoma.   Reviewed arm restrictions and wound care.   Will keep wound check for 4/10. All questions answered.   Casimiro Needle [address] [place], PA-C  04/28/2023 10:26 AM

## 2023-04-28 NOTE — Patient Instructions (Signed)
 Medication Instructions:  Your physician recommends that you continue on your current medications as directed. Please refer to the Current Medication list given to you today.  *If you need a refill on your cardiac medications before your next appointment, please call your pharmacy*  Lab Work: None ordered If you have labs (blood work) drawn today and your tests are completely normal, you will receive your results only by: MyChart Message (if you have MyChart) OR A paper copy in the mail If you have any lab test that is abnormal or we need to change your treatment, we will call you to review the results.  Follow-Up: At [facility], you and your health needs are our priority.  As part of our continuing mission to provide you with exceptional heart care, our providers are all part of one team.  This team includes your primary Cardiologist (physician) and Advanced Practice Providers or APPs (Physician Assistants and Nurse Practitioners) who all work together to provide you with the care you need, when you need it.  Your next appointment:   As scheduled  We recommend signing up for the patient portal called "MyChart".  Sign up information is provided on this After Visit Summary.  MyChart is used to connect with patients for Virtual Visits (Telemedicine).  Patients are able to view lab/test results, encounter notes, upcoming appointments, etc.  Non-urgent messages can be sent to your provider as well.   To learn more about what you can do with MyChart, go to [email or web address].     1st Floor: - Lobby - Registration  - Pharmacy  - Lab - Cafe  2nd Floor: - PV Lab - Diagnostic Testing (echo, CT, nuclear med)  3rd Floor: - Vacant  4th Floor: - TCTS (cardiothoracic surgery) - AFib Clinic - Structural Heart Clinic - Vascular Surgery  - Vascular Ultrasound  5th Floor: - HeartCare Cardiology (general and EP) - Clinical Pharmacy for coumadin, hypertension, lipid,  weight-loss medications, and med management appointments    Valet parking services will be available as well.

## 2023-05-02 ENCOUNTER — Ambulatory Visit: Attending: Cardiology

## 2023-05-02 ENCOUNTER — Encounter

## 2023-05-02 DIAGNOSIS — Z952 Presence of prosthetic heart valve: Secondary | ICD-10-CM | POA: Diagnosis not present

## 2023-05-02 DIAGNOSIS — Z7901 Long term (current) use of anticoagulants: Secondary | ICD-10-CM | POA: Diagnosis not present

## 2023-05-02 DIAGNOSIS — I4891 Unspecified atrial fibrillation: Secondary | ICD-10-CM | POA: Diagnosis not present

## 2023-05-02 DIAGNOSIS — I4819 Other persistent atrial fibrillation: Secondary | ICD-10-CM | POA: Diagnosis not present

## 2023-05-02 LAB — POCT INR: INR: 2.5 (ref 2.0–3.0)

## 2023-05-02 NOTE — Patient Instructions (Signed)
 Continue 1 tablet daily.  Recheck INR in  3 weeks. Clinic [phone]

## 2023-05-05 ENCOUNTER — Telehealth: Payer: Self-pay

## 2023-05-05 NOTE — Telephone Encounter (Signed)
 I spoke to patient who decided to return to his PCP for INR monitoring.

## 2023-05-08 ENCOUNTER — Ambulatory Visit: Attending: Cardiology

## 2023-05-08 DIAGNOSIS — I5020 Unspecified systolic (congestive) heart failure: Secondary | ICD-10-CM

## 2023-05-08 LAB — CUP PACEART INCLINIC DEVICE CHECK
Battery Remaining Longevity: 86 mo
Brady Statistic RA Percent Paced: 87 %
Brady Statistic RV Percent Paced: 99.91 %
Date Time Interrogation Session: 20250410162753
HighPow Impedance: 61.875
Implantable Lead Connection Status: 753985
Implantable Lead Connection Status: 753985
Implantable Lead Connection Status: 753985
Implantable Lead Implant Date: 20250328
Implantable Lead Implant Date: 20250328
Implantable Lead Implant Date: 20250328
Implantable Lead Location: 753858
Implantable Lead Location: 753859
Implantable Lead Location: 753860
Implantable Pulse Generator Implant Date: 20250328
Lead Channel Impedance Value: 462.5 Ohm
Lead Channel Impedance Value: 462.5 Ohm
Lead Channel Impedance Value: 512.5 Ohm
Lead Channel Pacing Threshold Amplitude: 0.5 V
Lead Channel Pacing Threshold Amplitude: 0.5 V
Lead Channel Pacing Threshold Amplitude: 0.75 V
Lead Channel Pacing Threshold Amplitude: 0.75 V
Lead Channel Pacing Threshold Amplitude: 2 V
Lead Channel Pacing Threshold Amplitude: 2 V
Lead Channel Pacing Threshold Pulse Width: 0.3 ms
Lead Channel Pacing Threshold Pulse Width: 0.3 ms
Lead Channel Pacing Threshold Pulse Width: 0.5 ms
Lead Channel Pacing Threshold Pulse Width: 0.5 ms
Lead Channel Pacing Threshold Pulse Width: 0.8 ms
Lead Channel Pacing Threshold Pulse Width: 0.8 ms
Lead Channel Sensing Intrinsic Amplitude: 11.8 mV
Lead Channel Sensing Intrinsic Amplitude: 4.8 mV
Lead Channel Setting Pacing Amplitude: 1.625
Lead Channel Setting Pacing Amplitude: 1.625
Lead Channel Setting Pacing Amplitude: 2.25 V
Lead Channel Setting Pacing Pulse Width: 0.3 ms
Lead Channel Setting Pacing Pulse Width: 0.8 ms
Lead Channel Setting Sensing Sensitivity: 0.5 mV
Pulse Gen Serial Number: 211038480
Zone Setting Status: 755011

## 2023-05-08 NOTE — Progress Notes (Signed)
 Normal multi chamber ICD wound check. Wound well healed. Presenting rhythm: AP/VP 65 . Routine testing performed. Thresholds, sensing, and impedances consistent with implant measurements with 3.5V safety margin/auto capture until 3 month visit. No treated arrhythmias. Reviewed arm restrictions to continue for 6 weeks total post op. Reviewed shock plan.  Pt enrolled in remote follow-up.

## 2023-05-08 NOTE — Patient Instructions (Signed)

## 2023-05-09 ENCOUNTER — Other Ambulatory Visit: Payer: Self-pay | Admitting: Family Medicine

## 2023-05-10 ENCOUNTER — Encounter: Payer: Self-pay | Admitting: Cardiology

## 2023-05-12 ENCOUNTER — Ambulatory Visit: Payer: Medicare PPO | Admitting: Cardiology

## 2023-05-12 ENCOUNTER — Telehealth: Payer: Self-pay

## 2023-05-12 NOTE — Telephone Encounter (Signed)
-----   Message from [name] [name] sent at 05/11/2023  8:34 PM EDT ----- Regarding: Appt Monday - doesn't need Hey - this patient has an appt tomorrow with me for a 3mon follow-up for ablation... that didn't happen.   He then had a device placed 2 weeks ago and had follow-up from the device last week, so he really doesn't need to see me Monday.

## 2023-05-12 NOTE — Telephone Encounter (Signed)
 Called patient, LVM advising of message below.  Advised patient to call back with confirmation to cancel appointment today-   Left call back number.

## 2023-05-15 ENCOUNTER — Other Ambulatory Visit: Payer: Self-pay | Admitting: Cardiology

## 2023-05-15 DIAGNOSIS — I420 Dilated cardiomyopathy: Secondary | ICD-10-CM

## 2023-05-19 ENCOUNTER — Telehealth: Payer: Self-pay | Admitting: Family Medicine

## 2023-05-19 NOTE — Telephone Encounter (Signed)
 Copied from CRM [id number]. Topic: Appointments - Scheduling Inquiry for Clinic >> May 19, 2023  3:03 PM Chuck Crater wrote: Reason for CRM: Patient wants to schedule a Coumadin  Appointment with Cathleen Coach.

## 2023-05-20 NOTE — Telephone Encounter (Signed)
 Contacted pt and scheduled for 4/24 at [facility]. Pt verbalized understanding.

## 2023-05-21 ENCOUNTER — Encounter

## 2023-05-22 ENCOUNTER — Ambulatory Visit

## 2023-05-22 DIAGNOSIS — Z7901 Long term (current) use of anticoagulants: Secondary | ICD-10-CM

## 2023-05-22 LAB — POCT INR: INR: 1.8 — AB (ref 2.0–3.0)

## 2023-05-22 NOTE — Patient Instructions (Addendum)
 Pre visit review using our clinic review tool, if applicable. No additional management support is needed unless otherwise documented below in the visit note.  Increase dose today and tomorrow to take 4.5 mg and then continue 3 mg daily. Recheck in 2 weeks.

## 2023-05-22 NOTE — Progress Notes (Signed)
 Pt had BIV ICD placement on 3/28 and pt started ing to cardiology for warfarin management. Pt requested to return to [facility] coumadin  clinic. Pt missed dose 2 days ago. Increase dose today and tomorrow to take 4.5 mg and then continue 3 mg daily. Recheck in 2 weeks.

## 2023-06-05 ENCOUNTER — Ambulatory Visit

## 2023-06-05 DIAGNOSIS — Z7901 Long term (current) use of anticoagulants: Secondary | ICD-10-CM

## 2023-06-05 LAB — POCT INR: INR: 2.7 (ref 2.0–3.0)

## 2023-06-05 MED ORDER — WARFARIN SODIUM 3 MG PO TABS: ORAL_TABLET | ORAL | 1 refills | Status: DC

## 2023-06-05 NOTE — Progress Notes (Addendum)
 Continue 3 mg daily. Recheck in 5 weeks.   Pt is compliant with warfarin management and PCP apts.  Sent in refill of warfarin to requested pharmacy.

## 2023-06-05 NOTE — Patient Instructions (Addendum)
 Pre visit review using our clinic review tool, if applicable. No additional management support is needed unless otherwise documented below in the visit note.  Continue 3 mg daily. Recheck in 5 weeks.

## 2023-06-07 ENCOUNTER — Other Ambulatory Visit: Payer: Self-pay | Admitting: Cardiology

## 2023-06-09 ENCOUNTER — Ambulatory Visit (INDEPENDENT_AMBULATORY_CARE_PROVIDER_SITE_OTHER)

## 2023-06-09 DIAGNOSIS — I5020 Unspecified systolic (congestive) heart failure: Secondary | ICD-10-CM

## 2023-06-09 DIAGNOSIS — I420 Dilated cardiomyopathy: Secondary | ICD-10-CM

## 2023-06-10 LAB — CUP PACEART REMOTE DEVICE CHECK
Battery Remaining Longevity: 86 mo
Battery Remaining Percentage: 95 %
Battery Voltage: 3.04 V
Brady Statistic AP VP Percent: 81 %
Brady Statistic AP VS Percent: 1 %
Brady Statistic AS VP Percent: 18 %
Brady Statistic AS VS Percent: 1 %
Brady Statistic RA Percent Paced: 81 %
Date Time Interrogation Session: 20250512020415
HighPow Impedance: 62 Ohm
Implantable Lead Connection Status: 753985
Implantable Lead Connection Status: 753985
Implantable Lead Connection Status: 753985
Implantable Lead Implant Date: 20250328
Implantable Lead Implant Date: 20250328
Implantable Lead Implant Date: 20250328
Implantable Lead Location: 753858
Implantable Lead Location: 753859
Implantable Lead Location: 753860
Implantable Pulse Generator Implant Date: 20250328
Lead Channel Impedance Value: 440 Ohm
Lead Channel Impedance Value: 440 Ohm
Lead Channel Impedance Value: 500 Ohm
Lead Channel Pacing Threshold Amplitude: 0.5 V
Lead Channel Pacing Threshold Amplitude: 0.625 V
Lead Channel Pacing Threshold Amplitude: 2.125 V
Lead Channel Pacing Threshold Pulse Width: 0.3 ms
Lead Channel Pacing Threshold Pulse Width: 0.5 ms
Lead Channel Pacing Threshold Pulse Width: 0.8 ms
Lead Channel Sensing Intrinsic Amplitude: 11.8 mV
Lead Channel Sensing Intrinsic Amplitude: 4.6 mV
Lead Channel Setting Pacing Amplitude: 1.5 V
Lead Channel Setting Pacing Amplitude: 1.625
Lead Channel Setting Pacing Amplitude: 2.625
Lead Channel Setting Pacing Pulse Width: 0.3 ms
Lead Channel Setting Pacing Pulse Width: 0.8 ms
Lead Channel Setting Sensing Sensitivity: 0.5 mV
Pulse Gen Serial Number: 211038480
Zone Setting Status: 755011

## 2023-06-15 ENCOUNTER — Ambulatory Visit: Payer: Self-pay | Admitting: Cardiology

## 2023-06-30 ENCOUNTER — Other Ambulatory Visit: Payer: Self-pay | Admitting: Cardiology

## 2023-06-30 DIAGNOSIS — I420 Dilated cardiomyopathy: Secondary | ICD-10-CM

## 2023-07-10 ENCOUNTER — Ambulatory Visit

## 2023-07-10 DIAGNOSIS — Z7901 Long term (current) use of anticoagulants: Secondary | ICD-10-CM | POA: Diagnosis not present

## 2023-07-10 LAB — POCT INR: INR: 2.2 (ref 2.0–3.0)

## 2023-07-10 NOTE — Patient Instructions (Addendum)
 Pre visit review using our clinic review tool, if applicable. No additional management support is needed unless otherwise documented below in the visit note.  Increase dose today to take 1 1/2 tablets and then continue 3 mg daily. Recheck in 3 weeks.

## 2023-07-10 NOTE — Progress Notes (Signed)
 Increase dose today to take 1 1/2 tablets and then continue 3 mg daily. Recheck in 3 weeks.

## 2023-07-22 DIAGNOSIS — H5213 Myopia, bilateral: Secondary | ICD-10-CM | POA: Diagnosis not present

## 2023-07-22 DIAGNOSIS — H524 Presbyopia: Secondary | ICD-10-CM | POA: Diagnosis not present

## 2023-07-22 DIAGNOSIS — E119 Type 2 diabetes mellitus without complications: Secondary | ICD-10-CM | POA: Diagnosis not present

## 2023-07-22 DIAGNOSIS — H2513 Age-related nuclear cataract, bilateral: Secondary | ICD-10-CM | POA: Diagnosis not present

## 2023-07-22 LAB — HM DIABETES EYE EXAM

## 2023-07-24 ENCOUNTER — Encounter: Payer: Self-pay | Admitting: Cardiology

## 2023-07-24 DIAGNOSIS — Z95810 Presence of automatic (implantable) cardiac defibrillator: Secondary | ICD-10-CM | POA: Insufficient documentation

## 2023-07-24 NOTE — Progress Notes (Signed)
 Electrophysiology Clinic Note    Date:  07/25/2023  Patient ID:  [name] [name], [name] [name] 1950-06-23, MRN [id number] PCP:  [name], MD  Cardiologist:  [name], MD HF Cardiologist: Rolan Electrophysiologist: [name], MD   Discussed the use of AI scribe software for clinical note transcription with the patient, who gave verbal consent to proceed.   Patient Profile    Chief Complaint: device follow-up  History of Present Illness: [name] [name] is a 73 y.o. male with PMH notable for persis Afib, aflutter, HFrEF, NICM, s/p ICD, bicuspid AV s/p mechanical AVR; seen today for [name], MD for routine electrophysiology follow-up s/p Defibrillator implant.  He is s/p aborted AF ablation d/t interrupted IVC.  He is s/p BiV ICD implant 03/2023.   On follow up today, he continues to have fatigue and reduced energy compared to how he felt before his heart conditions, but has noticed slight improvement since his BiV device was implanted. He questions whether he can resume his normal activity, thinks that maybe his reduced energy is because he is not as active as he wants to be because of newly implanted device. He specifically would like to push-mow his yard.  He is not aware of any AFib episodes. Does not think he has any edema.   He denies chest pain, chest pressure, continues warfarin managed at PCP office without bleeding concerns.       Arrhythmia/Device History Abbott CRT-D, imp 03/2023; dx HF, LBBB   AAD - Amiodarone      ROS:  Please see the history of present illness. All other systems are reviewed and otherwise negative.    Physical Exam    VS:  BP (!) 110/58   Pulse 62   Ht 5' 10 (1.778 m)   Wt 229 lb (103.9 kg)   SpO2 96%   BMI 32.86 kg/m  BMI: Body mass index is 32.86 kg/m.      Wt Readings from Last 3 Encounters:  07/25/23 229 lb (103.9 kg)  04/26/23 228 lb 6.3 oz (103.6 kg)  04/17/23 231 lb (104.8 kg)      GEN- The patient is well appearing, alert and oriented x 3 today.   Lungs- Clear to ausculation bilaterally, normal work of breathing.  Heart- Regular rate and rhythm, no murmurs, rubs or gallops Extremities- 1+ peripheral edema, warm, dry Skin-  device pocket well-healed, no tethering   Device interrogation done today and reviewed by myself:  Battery 5.7 years Lead thresholds, impedence, sensing stable  VP 99% LV threshold elevated at implant, turned off capconfirm and set at 0.22mv safety margin No episodes No further changes made today   Studies Reviewed   Previous EP, cardiology notes.    EKG is ordered. Personal review of EKG from today shows:    EKG Interpretation Date/Time:  Friday July 25 2023 10:48:24 EDT Ventricular Rate:  62 PR Interval:  194 QRS Duration:  174 QT Interval:  524 QTC Calculation: 531 R Axis:   258  Text Interpretation: AV dual-paced rhythm Confirmed by Swan Zayed [phone]) on 07/25/2023 10:56:45 AM    TTE, 02/18/2023  1. Left ventricular ejection fraction, by estimation, is 30 to 35%. The left ventricle has moderately decreased function. The left ventricle demonstrates global hypokinesis. There is mild left ventricular hypertrophy. Left ventricular diastolic parameters are consistent with Grade I diastolic dysfunction (impaired relaxation).   2. Right ventricular systolic function is mildly reduced. The right ventricular size is normal.  3. The mitral valve is normal in structure. No evidence of mitral valve regurgitation. No evidence of mitral stenosis.   4. The aortic valve has been repaired/replaced. Aortic valve regurgitation is not visualized. No aortic stenosis is present. There is a [employer]. Jude mechanical valve present in the aortic position. Procedure Date: 06/2009 . Echo findings are consistent with normal structure and function of the aortic valve prosthesis. Aortic valve area, by VTI measures 1.72 cm. Aortic valve mean gradient measures 6.5  mmHg. Aortic valve Vmax measures 1.76 m/s.   5. The inferior vena cava is normal in size with greater than 50% respiratory variability, suggesting right atrial pressure of 3 mmHg.   Comparison(s): No significant change from prior study. Prior images reviewed side by side. The left ventricular function is unchanged. EF 30%, mild LVH, AOV mean 7.5, peak 14.7.     Cardiac CT, 01/30/2023 1. There is normal pulmonary vein drainage into the left atrium with ostial measurements above.  2. There is no thrombus in the left atrial appendage.  3. The esophagus runs in the left atrial midline and is not in proximity to any of the pulmonary vein ostia.  4. No PFO/ASD.  5. Normal coronary origin. Right dominance.  6. CAC score of 0 which is 1st percentile for age-, race-, and sex-matched controls.  7. Aortic atherosclerosis.  8.  Aortic valve replacement present.   Assessment and Plan     #) NICM #) LBBB #)HFrEF #) CRT-D in situ Device functioning well, see paceart for details EKG today with RB-appearing QRS, though wide at Will update TTE No further activity limitations, encouraged patient to resume normal activities and re-assess lower extremity edema. If edema persists, consider restarting PRN lasix  He is also due for follow-up with HF, will help facilitate   #) persis Afib #) aflutter S/p aborted AF ablation Maintaining sinus rhythm on 200mg  amiodarone  Update LFTs, thyroid  labs today   #) Hypercoag d/t persis afib CHA2DS2-VASc Score = at least 4 [CHF History: 1, HTN History: 1, Diabetes History: 1, Stroke History: 0, Vascular Disease History: 0, Age Score: 1, Gender Score: 0].  Therefore, the patient's annual risk of stroke is 4.8 %.    Stroke ppx - warfarin, managed at PCP office No bleeding concerns          Current medicines are reviewed at length with the patient today.   The patient does not have concerns regarding his medicines.  The following changes were made  today:  none  Labs/ tests ordered today include:  Orders Placed This Encounter  Procedures   T4, free   TSH   Comprehensive metabolic panel with GFR   Magnesium    EKG 12-Lead   ECHOCARDIOGRAM COMPLETE     Disposition: Follow up with Dr. Cindie or EP APP in 6 months   Signed, Chantal Needle, NP  07/25/23  11:58 AM  Electrophysiology [facility]

## 2023-07-25 ENCOUNTER — Encounter: Payer: Self-pay | Admitting: Cardiology

## 2023-07-25 ENCOUNTER — Ambulatory Visit: Payer: Self-pay | Admitting: Cardiology

## 2023-07-25 ENCOUNTER — Ambulatory Visit: Attending: Cardiology | Admitting: Cardiology

## 2023-07-25 VITALS — BP 110/58 | HR 62 | Ht 70.0 in | Wt 229.0 lb

## 2023-07-25 DIAGNOSIS — Z95810 Presence of automatic (implantable) cardiac defibrillator: Secondary | ICD-10-CM

## 2023-07-25 DIAGNOSIS — Z7901 Long term (current) use of anticoagulants: Secondary | ICD-10-CM

## 2023-07-25 DIAGNOSIS — I447 Left bundle-branch block, unspecified: Secondary | ICD-10-CM

## 2023-07-25 DIAGNOSIS — I4819 Other persistent atrial fibrillation: Secondary | ICD-10-CM | POA: Diagnosis not present

## 2023-07-25 DIAGNOSIS — I5020 Unspecified systolic (congestive) heart failure: Secondary | ICD-10-CM | POA: Diagnosis not present

## 2023-07-25 LAB — CUP PACEART INCLINIC DEVICE CHECK
Battery Remaining Longevity: 68 mo
Brady Statistic RA Percent Paced: 85 %
Brady Statistic RV Percent Paced: 99.88 %
Date Time Interrogation Session: 20250627120558
HighPow Impedance: 65.25 Ohm
Implantable Lead Connection Status: 753985
Implantable Lead Connection Status: 753985
Implantable Lead Connection Status: 753985
Implantable Lead Implant Date: 20250328
Implantable Lead Implant Date: 20250328
Implantable Lead Implant Date: 20250328
Implantable Lead Location: 753858
Implantable Lead Location: 753859
Implantable Lead Location: 753860
Implantable Pulse Generator Implant Date: 20250328
Lead Channel Impedance Value: 462.5 Ohm
Lead Channel Impedance Value: 487.5 Ohm
Lead Channel Impedance Value: 500 Ohm
Lead Channel Pacing Threshold Amplitude: 0.75 V
Lead Channel Pacing Threshold Amplitude: 0.75 V
Lead Channel Pacing Threshold Amplitude: 1 V
Lead Channel Pacing Threshold Amplitude: 1 V
Lead Channel Pacing Threshold Amplitude: 2.125 V
Lead Channel Pacing Threshold Amplitude: 2.25 V
Lead Channel Pacing Threshold Pulse Width: 0.3 ms
Lead Channel Pacing Threshold Pulse Width: 0.3 ms
Lead Channel Pacing Threshold Pulse Width: 0.5 ms
Lead Channel Pacing Threshold Pulse Width: 0.5 ms
Lead Channel Pacing Threshold Pulse Width: 0.8 ms
Lead Channel Pacing Threshold Pulse Width: 0.8 ms
Lead Channel Sensing Intrinsic Amplitude: 11.8 mV
Lead Channel Sensing Intrinsic Amplitude: 4.2 mV
Lead Channel Setting Pacing Amplitude: 1.75 V
Lead Channel Setting Pacing Amplitude: 1.75 V
Lead Channel Setting Pacing Amplitude: 2.75 V
Lead Channel Setting Pacing Pulse Width: 0.3 ms
Lead Channel Setting Pacing Pulse Width: 0.8 ms
Lead Channel Setting Sensing Sensitivity: 0.5 mV
Pulse Gen Serial Number: 211038480
Zone Setting Status: 755011

## 2023-07-25 NOTE — Patient Instructions (Addendum)
 Medication Instructions:  The current medical regimen is effective;  continue present plan and medications as directed. Please refer to the Current Medication list given to you today.   *If you need a refill on your cardiac medications before your next appointment, please call your pharmacy*  Testing/Procedures:  Your physician has requested that you have an echocardiogram. Echocardiography is a painless test that uses sound waves to create images of your heart. It provides your doctor with information about the size and shape of your heart and how well your heart's chambers and valves are working.   You may receive an ultrasound enhancing agent through an IV if needed to better visualize your heart during the echo. This procedure takes approximately one hour.  There are no restrictions for this procedure.  This will take place at [address] [facility] [address], [place] [address]  Please note: We ask at that you not bring children with you during ultrasound (echo/ vascular) testing. Due to room size and safety concerns, children are not allowed in the ultrasound rooms during exams. Our front office staff cannot provide observation of children in our lobby area while testing is being conducted. An adult accompanying a patient to their appointment will only be allowed in the ultrasound room at the discretion of the ultrasound technician under special circumstances. We apologize for any inconvenience.   Follow-Up: At [facility], you and your health needs are our priority.  As part of our continuing mission to provide you with exceptional heart care, our providers are all part of one team.  This team includes your primary Cardiologist (physician) and Advanced Practice Providers or APPs (Physician Assistants and Nurse Practitioners) who all work together to provide you with the care you need, when you need it.  Your next appointment:   Please schedule with HF Clinic  (Dr.McLean)   6 months back with Dr.Lambert or [name] [name], NP  We recommend signing up for the patient portal called MyChart.  Sign up information is provided on this After Visit Summary.  MyChart is used to connect with patients for Virtual Visits (Telemedicine).  Patients are able to view lab/test results, encounter notes, upcoming appointments, etc.  Non-urgent messages can be sent to your provider as well.   To learn more about what you can do with MyChart, go to [email or web address].

## 2023-07-28 NOTE — Progress Notes (Signed)
 Remote ICD transmission.

## 2023-07-28 NOTE — Addendum Note (Signed)
 Addended by: TAWNI DRILLING D on: 07/28/2023 09:56 AM   Modules accepted: Orders

## 2023-07-30 ENCOUNTER — Telehealth: Payer: Self-pay

## 2023-07-30 ENCOUNTER — Other Ambulatory Visit: Payer: Self-pay

## 2023-07-30 DIAGNOSIS — I420 Dilated cardiomyopathy: Secondary | ICD-10-CM | POA: Diagnosis not present

## 2023-07-30 DIAGNOSIS — I4891 Unspecified atrial fibrillation: Secondary | ICD-10-CM | POA: Diagnosis not present

## 2023-07-30 NOTE — Telephone Encounter (Signed)
 Called patient - advised of message below.   Patient unaware lab work was needed, but will come get them completed as soon as possible.   Patient thankful for call back.

## 2023-07-30 NOTE — Telephone Encounter (Signed)
-----   Message from [name] [name] sent at 07/30/2023 11:05 AM EDT ----- This patient didn't get his labs drawn, could you call him to come get them? ----- Message ----- From: SYSTEM Sent: 07/30/2023  12:19 AM EDT To: [name] [name], NP

## 2023-07-31 ENCOUNTER — Ambulatory Visit

## 2023-07-31 ENCOUNTER — Ambulatory Visit: Payer: Self-pay | Admitting: Cardiology

## 2023-07-31 DIAGNOSIS — Z7901 Long term (current) use of anticoagulants: Secondary | ICD-10-CM | POA: Diagnosis not present

## 2023-07-31 LAB — COMPREHENSIVE METABOLIC PANEL WITH GFR
ALT: 22 IU/L (ref 0–44)
AST: 26 IU/L (ref 0–40)
Albumin: 4.3 g/dL (ref 3.8–4.8)
Alkaline Phosphatase: 75 IU/L (ref 44–121)
BUN/Creatinine Ratio: 17 (ref 10–24)
BUN: 23 mg/dL (ref 8–27)
Bilirubin Total: 0.5 mg/dL (ref 0.0–1.2)
CO2: 17 mmol/L — ABNORMAL LOW (ref 20–29)
Calcium: 9 mg/dL (ref 8.6–10.2)
Chloride: 105 mmol/L (ref 96–106)
Creatinine, Ser: 1.35 mg/dL — ABNORMAL HIGH (ref 0.76–1.27)
Globulin, Total: 2.1 g/dL (ref 1.5–4.5)
Glucose: 80 mg/dL (ref 70–99)
Potassium: 5.3 mmol/L — ABNORMAL HIGH (ref 3.5–5.2)
Sodium: 138 mmol/L (ref 134–144)
Total Protein: 6.4 g/dL (ref 6.0–8.5)
eGFR: 56 mL/min/{1.73_m2} — ABNORMAL LOW (ref >59)

## 2023-07-31 LAB — CBC
Hematocrit: 44.6 % (ref 37.5–51.0)
Hemoglobin: 15.1 g/dL (ref 13.0–17.7)
MCH: 33 pg (ref 26.6–33.0)
MCHC: 33.9 g/dL (ref 31.5–35.7)
MCV: 98 fL — ABNORMAL HIGH (ref 79–97)
Platelets: 169 10*3/uL (ref 150–450)
RBC: 4.57 x10E6/uL (ref 4.14–5.80)
RDW: 14 % (ref 11.6–15.4)
WBC: 5.1 10*3/uL (ref 3.4–10.8)

## 2023-07-31 LAB — T4, FREE: Free T4: 1.8 ng/dL — ABNORMAL HIGH (ref 0.82–1.77)

## 2023-07-31 LAB — MAGNESIUM: Magnesium: 1.8 mg/dL (ref 1.6–2.3)

## 2023-07-31 LAB — POCT INR: INR: 2.6 (ref 2.0–3.0)

## 2023-07-31 LAB — TSH: TSH: 1.91 u[IU]/mL (ref 0.450–4.500)

## 2023-07-31 NOTE — Progress Notes (Signed)
 Pt reported he forgot to report at the last coumadin  clinic apt he had missed his warfarin dose the day before. This would explain the subtherapeutic INR at that apt.  Continue 3 mg daily. Recheck in 6 weeks.

## 2023-07-31 NOTE — Patient Instructions (Addendum)
Pre visit review using our clinic review tool, if applicable. No additional management support is needed unless otherwise documented below in the visit note.  Continue 3 mg daily. Recheck in 6 weeks.

## 2023-08-11 ENCOUNTER — Telehealth: Payer: Self-pay

## 2023-08-11 NOTE — Telephone Encounter (Signed)
 Left message to advise that there is not an appointment available sooner. That patient will have to be seen in office.

## 2023-08-11 NOTE — Telephone Encounter (Signed)
 Copied from CRM [id number]. Topic: Clinical - Request for Lab/Test Order >> Aug 11, 2023  1:17 PM Corin V wrote: Reason for CRM: Patient wife called and stated she tested positive for strep today and wanted to have her husband tested as well. He was scheduled for first available in office on 7/16 but they wanted to know if he could come in to just get a swab for culture since no availability for 2 days. Please call back at [phone]

## 2023-08-12 ENCOUNTER — Ambulatory Visit: Attending: Cardiology

## 2023-08-12 DIAGNOSIS — I5020 Unspecified systolic (congestive) heart failure: Secondary | ICD-10-CM

## 2023-08-12 DIAGNOSIS — Z95810 Presence of automatic (implantable) cardiac defibrillator: Secondary | ICD-10-CM

## 2023-08-12 LAB — ECHOCARDIOGRAM COMPLETE
AV Mean grad: 6 mmHg
AV Peak grad: 11.3 mmHg
Ao pk vel: 1.68 m/s
Area-P 1/2: 3.12 cm2
S' Lateral: 3.94 cm

## 2023-08-12 MED ORDER — PERFLUTREN LIPID MICROSPHERE
1.0000 mL | INTRAVENOUS | Status: AC | PRN
  Administered 2023-08-12: 3 mL via INTRAVENOUS

## 2023-08-12 NOTE — Telephone Encounter (Signed)
 Lvmtcb

## 2023-08-13 ENCOUNTER — Other Ambulatory Visit: Payer: Self-pay | Admitting: Family Medicine

## 2023-08-13 ENCOUNTER — Encounter: Payer: Self-pay | Admitting: Family Medicine

## 2023-08-13 ENCOUNTER — Ambulatory Visit: Admitting: Family Medicine

## 2023-08-13 VITALS — BP 118/62 | HR 63 | Temp 98.3°F | Ht 68.9 in | Wt 226.4 lb

## 2023-08-13 DIAGNOSIS — J020 Streptococcal pharyngitis: Secondary | ICD-10-CM | POA: Diagnosis not present

## 2023-08-13 LAB — POCT RAPID STREP A (OFFICE): Rapid Strep A Screen: POSITIVE — AB

## 2023-08-13 MED ORDER — AMOXICILLIN 500 MG PO CAPS: 1000.0000 mg | ORAL_CAPSULE | Freq: Two times a day (BID) | ORAL | 0 refills | Status: AC

## 2023-08-13 NOTE — Progress Notes (Signed)
 Patient ID: [name] [name], male    DOB: 04-12-1950, 73 y.o.   MRN: [id number]  This visit was conducted in person.  BP 118/62   Pulse 63   Temp 98.3 F (36.8 C) (Oral)   Ht 5' 8.9 (1.75 m)   Wt 226 lb 6.4 oz (102.7 kg)   SpO2 98%   BMI 33.53 kg/m    CC:  Chief Complaint  Patient presents with   Sore Throat    Patients wife tested positive for strep on Monday. Patient had some amoxicillin  from the dentist left over so he has been taking that.    Ear Pain    Left ear. Not hurting now but it comes and go    Subjective:   HPI: [name] [name] is a 73 y.o. male presenting on 08/13/2023 for Sore Throat (Patients wife tested positive for strep on Monday. Patient had some amoxicillin  from the dentist left over so he has been taking that. ) and Ear Pain (Left ear. Not hurting now but it comes and go)   Date of onset:  4 days Initial symptoms included   severe ST, left ear pain Symptoms progressed to dry cough No fever  He has noted some improvement in symptoms with amox.  Sick contacts:  wife with strep COVID testing:   none     He has tried to treat with  leftover amoxicillin  from dentist... 500mg   4 tablets daily x 2 days (started Monday)     No history of chronic lung disease such as asthma or COPD.  HAs pacemaker, DM, heart valce. Non-smoker.       Relevant past medical, surgical, family and social history reviewed and updated as indicated. Interim medical history since our last visit reviewed. Allergies and medications reviewed and updated. Outpatient Medications Prior to Visit  Medication Sig Dispense Refill   ACCU-CHEK AVIVA PLUS test strip USE AS DIRECTED TO CHECK BLOOD SUGAR TWO TO FOUR TIMES A DAY 100 strip 11   Accu-Chek FastClix Lancets MISC USE AS DIRECTED TO CHECK BLOOD SUGAR TWICE DAILY 102 each 4   amiodarone  (PACERONE ) 200 MG tablet Take 1 tablet (200 mg total) by mouth daily. 90 tablet 3   azelastine  (ASTELIN ) 0.1 % nasal spray Place 2 sprays  into both nostrils 2 (two) times daily as needed for rhinitis. Use in each nostril as directed 30 mL 12   carvedilol  (COREG ) 3.125 MG tablet TAKE ONE TABLET (3.125 MG TOTAL) BY MOUTH TWO TIMES DAILY WITH A MEAL. 180 tablet 3   cetirizine (ZYRTEC) 10 MG tablet Take 10 mg by mouth daily as needed for allergies.     empagliflozin  (JARDIANCE ) 10 MG TABS tablet Take 1 tablet (10 mg total) by mouth daily. 90 tablet 3   FIBER GUMMIES PO Take 1 capsule by mouth daily.     fluocinonide  cream (LIDEX ) 0.05 % APPLY 1 APPLICATION TO AFFECTEDE AREAS 2 TIMES DAILY AS NEEDED 30 g 1   glipiZIDE  (GLUCOTROL ) 5 MG tablet 1 tab if AM sugar is above 150, 2 tabs if AM sugar is above 175. If sugar is below 150, skip dose.     Lancets Misc. (ACCU-CHEK FASTCLIX LANCET) KIT AS DIRECTED 1 kit 0   metFORMIN  (GLUCOPHAGE ) 500 MG tablet TAKE TWO TABLETS BY MOUTH TWO TIMES DAILY 360 tablet 3   pantoprazole  (PROTONIX ) 40 MG tablet Take 40 mg by mouth daily.     pravastatin  (PRAVACHOL ) 20 MG tablet TAKE ONE TABLET BY MOUTH ONCE  A DAY 90 tablet 1   sacubitril -valsartan  (ENTRESTO ) 97-103 MG Take 1 tablet by mouth 2 (two) times daily. 180 tablet 3   spironolactone  (ALDACTONE ) 25 MG tablet Take 1 tablet (25 mg total) by mouth daily. 90 tablet 3   tamsulosin  (FLOMAX ) 0.4 MG CAPS capsule Take 2 capsules (0.8 mg total) by mouth daily. 90 capsule 3   warfarin (COUMADIN ) 3 MG tablet TAKE ONE TABLET BY MOUTH DAILY OR AS DIRECTED BY ANTICOAGULATION CLINIC 100 tablet 1   No facility-administered medications prior to visit.     Per HPI unless specifically indicated in ROS section below Review of Systems  Constitutional:  Negative for fatigue and fever.  HENT:  Positive for ear pain and sore throat.   Eyes:  Negative for pain.  Respiratory:  Positive for cough. Negative for shortness of breath.   Cardiovascular:  Negative for chest pain, palpitations and leg swelling.  Gastrointestinal:  Negative for abdominal pain.  Genitourinary:   Negative for dysuria.  Musculoskeletal:  Negative for arthralgias.  Neurological:  Negative for syncope, light-headedness and headaches.  Psychiatric/Behavioral:  Negative for dysphoric mood.    Objective:  BP 118/62   Pulse 63   Temp 98.3 F (36.8 C) (Oral)   Ht 5' 8.9 (1.75 m)   Wt 226 lb 6.4 oz (102.7 kg)   SpO2 98%   BMI 33.53 kg/m   Wt Readings from Last 3 Encounters:  08/13/23 226 lb 6.4 oz (102.7 kg)  07/25/23 229 lb (103.9 kg)  04/26/23 228 lb 6.3 oz (103.6 kg)      Physical Exam Constitutional:      General: He is not in acute distress.    Appearance: Normal appearance. He is well-developed. He is not ill-appearing or toxic-appearing.  HENT:     Head: Normocephalic and atraumatic.     Right Ear: Hearing, tympanic membrane, ear canal and external ear normal. No tenderness. No foreign body. Tympanic membrane is not retracted or bulging.     Left Ear: Hearing, tympanic membrane, ear canal and external ear normal. No tenderness. No foreign body. Tympanic membrane is not retracted or bulging.     Nose: Nose normal. No mucosal edema or rhinorrhea.     Right Sinus: No maxillary sinus tenderness or frontal sinus tenderness.     Left Sinus: No maxillary sinus tenderness or frontal sinus tenderness.     Mouth/Throat:     Dentition: Normal dentition. No dental caries.     Pharynx: Uvula midline. Posterior oropharyngeal erythema present. No oropharyngeal exudate.     Tonsils: No tonsillar abscesses.  Eyes:     General: Lids are normal. Lids are everted, no foreign bodies appreciated.     Conjunctiva/sclera: Conjunctivae normal.     Pupils: Pupils are equal, round, and reactive to light.  Neck:     Thyroid : No thyroid  mass or thyromegaly.     Vascular: No carotid bruit.     Trachea: Trachea and phonation normal.  Cardiovascular:     Rate and Rhythm: Normal rate and regular rhythm.     Pulses: Normal pulses.     Heart sounds: Normal heart sounds, S1 normal and S2 normal. No  murmur heard.    No gallop.  Pulmonary:     Effort: Pulmonary effort is normal. No respiratory distress.     Breath sounds: Normal breath sounds. No wheezing, rhonchi or rales.  Abdominal:     General: Bowel sounds are normal.     Palpations: Abdomen is soft.  Tenderness: There is no abdominal tenderness. There is no guarding or rebound.     Hernia: No hernia is present.  Musculoskeletal:     Cervical back: Normal range of motion and neck supple.  Skin:    General: Skin is warm and dry.     Findings: No rash.  Neurological:     Mental Status: He is alert.     Deep Tendon Reflexes: Reflexes are normal and symmetric.  Psychiatric:        Speech: Speech normal.        Behavior: Behavior normal.        Judgment: Judgment normal.       Results for orders placed or performed in visit on 08/13/23  POCT rapid strep A   Collection Time: 08/13/23 12:00 PM  Result Value Ref Range   Rapid Strep A Screen Positive (A) Negative   *Note: Due to a large number of results and/or encounters for the requested time period, some results have not been displayed. A complete set of results can be found in Results Review.    Assessment and Plan  Strep throat Assessment & Plan: Acute, partially treated.  Positive strep throat test in office today. Treat with amoxicillin  500 mg 2 capsules twice daily for an additional 8 days for total of 10-day course. Can use Tylenol  as needed for pain, Chloraseptic and encourage patient to push fluids.  Return and ER precautions provided.  Orders: -     POCT rapid strep A  Other orders -     Amoxicillin ; Take 2 capsules (1,000 mg total) by mouth 2 (two) times daily for 8 days.  Dispense: 32 capsule; Refill: 0    Return if symptoms worsen or fail to improve.   Greig Ring, MD

## 2023-08-13 NOTE — Assessment & Plan Note (Signed)
 Acute, partially treated.  Positive strep throat test in office today. Treat with amoxicillin  500 mg 2 capsules twice daily for an additional 8 days for total of 10-day course. Can use Tylenol  as needed for pain, Chloraseptic and encourage patient to push fluids.  Return and ER precautions provided.

## 2023-08-17 LAB — FECAL OCCULT BLOOD, IMMUNOCHEMICAL: IFOBT: NEGATIVE

## 2023-08-20 ENCOUNTER — Other Ambulatory Visit: Payer: Self-pay | Admitting: Family Medicine

## 2023-08-20 ENCOUNTER — Telehealth: Payer: Self-pay | Admitting: Cardiology

## 2023-08-20 NOTE — Telephone Encounter (Signed)
 Called to confirm/remind patient of their appointment at the [facility] on 08/21/23.   Appointment:   [x] Confirmed  [] Left mess   [] No answer/No voice mail  [] VM Full/unable to leave message  [] Phone not in service  Patient reminded to bring all medications and/or complete list.  Confirmed patient has transportation. Gave directions, instructed to utilize valet parking.

## 2023-08-21 ENCOUNTER — Ambulatory Visit (HOSPITAL_COMMUNITY): Payer: Self-pay | Admitting: Cardiology

## 2023-08-21 ENCOUNTER — Other Ambulatory Visit
Admission: RE | Admit: 2023-08-21 | Discharge: 2023-08-21 | Disposition: A | Discharge status: Home / Self Care | Source: Ambulatory Visit | Attending: Cardiology | Admitting: Cardiology

## 2023-08-21 ENCOUNTER — Ambulatory Visit: Admitting: Cardiology

## 2023-08-21 VITALS — BP 122/63 | HR 60 | Wt 225.0 lb

## 2023-08-21 DIAGNOSIS — Z7901 Long term (current) use of anticoagulants: Secondary | ICD-10-CM | POA: Diagnosis not present

## 2023-08-21 DIAGNOSIS — I5020 Unspecified systolic (congestive) heart failure: Secondary | ICD-10-CM | POA: Diagnosis not present

## 2023-08-21 DIAGNOSIS — G4733 Obstructive sleep apnea (adult) (pediatric): Secondary | ICD-10-CM | POA: Insufficient documentation

## 2023-08-21 DIAGNOSIS — I480 Paroxysmal atrial fibrillation: Secondary | ICD-10-CM | POA: Diagnosis not present

## 2023-08-21 DIAGNOSIS — I110 Hypertensive heart disease with heart failure: Secondary | ICD-10-CM | POA: Insufficient documentation

## 2023-08-21 DIAGNOSIS — I484 Atypical atrial flutter: Secondary | ICD-10-CM | POA: Diagnosis not present

## 2023-08-21 DIAGNOSIS — I509 Heart failure, unspecified: Secondary | ICD-10-CM | POA: Diagnosis not present

## 2023-08-21 DIAGNOSIS — I5023 Acute on chronic systolic (congestive) heart failure: Secondary | ICD-10-CM | POA: Diagnosis not present

## 2023-08-21 DIAGNOSIS — I4891 Unspecified atrial fibrillation: Secondary | ICD-10-CM

## 2023-08-21 DIAGNOSIS — Z79899 Other long term (current) drug therapy: Secondary | ICD-10-CM | POA: Insufficient documentation

## 2023-08-21 DIAGNOSIS — I447 Left bundle-branch block, unspecified: Secondary | ICD-10-CM | POA: Insufficient documentation

## 2023-08-21 DIAGNOSIS — Z7984 Long term (current) use of oral hypoglycemic drugs: Secondary | ICD-10-CM | POA: Diagnosis not present

## 2023-08-21 DIAGNOSIS — I7121 Aneurysm of the ascending aorta, without rupture: Secondary | ICD-10-CM | POA: Insufficient documentation

## 2023-08-21 DIAGNOSIS — Z952 Presence of prosthetic heart valve: Secondary | ICD-10-CM | POA: Diagnosis not present

## 2023-08-21 LAB — COMPREHENSIVE METABOLIC PANEL WITH GFR
ALT: 21 U/L (ref 0–44)
AST: 26 U/L (ref 15–41)
Albumin: 4.3 g/dL (ref 3.5–5.0)
Alkaline Phosphatase: 73 U/L (ref 38–126)
Anion gap: 12 (ref 5–15)
BUN: 23 mg/dL (ref 8–23)
CO2: 24 mmol/L (ref 22–32)
Calcium: 9.4 mg/dL (ref 8.9–10.3)
Chloride: 103 mmol/L (ref 98–111)
Creatinine, Ser: 1.36 mg/dL — ABNORMAL HIGH (ref 0.61–1.24)
GFR, Estimated: 56 mL/min — ABNORMAL LOW (ref >60)
Glucose, Bld: 113 mg/dL — ABNORMAL HIGH (ref 70–99)
Potassium: 4.3 mmol/L (ref 3.5–5.1)
Sodium: 139 mmol/L (ref 135–145)
Total Bilirubin: 1.1 mg/dL (ref 0.0–1.2)
Total Protein: 7.8 g/dL (ref 6.5–8.1)

## 2023-08-21 LAB — BRAIN NATRIURETIC PEPTIDE: B Natriuretic Peptide: 51.5 pg/mL (ref 0.0–100.0)

## 2023-08-21 MED ORDER — AMIODARONE HCL 100 MG PO TABS: 100.0000 mg | ORAL_TABLET | Freq: Every day | ORAL | 3 refills | Status: DC

## 2023-08-21 MED ORDER — CARVEDILOL 6.25 MG PO TABS: 6.2500 mg | ORAL_TABLET | Freq: Two times a day (BID) | ORAL | 2 refills | Status: DC

## 2023-08-21 NOTE — Patient Instructions (Addendum)
 Medication Changes:  DECREASE AMIODARONE  TO 100 MG ONCE DAILY  INCREASE COREG  TO 6.25 MG ONCE DAILY TWICE DAILY   Lab Work:  Go over to the [facility]. Go pass the gift shop and have your blood work completed.  We will only call you if the results are abnormal or if the provider would like to make medication changes.  No news is good news.   Follow-Up in: 3 MONTHS WITH DR. ROLAN  Our Doctors' schedules are NOT open yet for 3 months. We will place you on our recall list. Once they are available, we will call you to schedule your follow up appointment.    Thank you for choosing [facility].    At the [facility], you and your health needs are our priority. We have a designated team specialized in the treatment of Heart Failure. This Care Team includes your primary Heart Failure Specialized Cardiologist (physician), Advanced Practice Providers (APPs- Physician Assistants and Nurse Practitioners), and Pharmacist who all work together to provide you with the care you need, when you need it.   You may see any of the following providers on your designated Care Team at your next follow up:  Dr. Toribio Fuel Dr. Ezra ROLAN Dr. Ria Commander Dr. Morene Brownie Ellouise Class, FNP Jaun Bash, RPH-CPP  Please be sure to bring in all your medications bottles to every appointment.   Need to Contact Us :  If you have any questions or concerns before your next appointment please send us  a message through [place] or call our office at [phone].    TO LEAVE A MESSAGE FOR THE NURSE SELECT OPTION 2, PLEASE LEAVE A MESSAGE INCLUDING: YOUR NAME DATE OF BIRTH CALL BACK NUMBER REASON FOR CALL**this is important as we prioritize the call backs  YOU WILL RECEIVE A CALL BACK THE SAME DAY AS LONG AS YOU CALL BEFORE 4:00 PM

## 2023-08-22 ENCOUNTER — Telehealth: Payer: Self-pay

## 2023-08-22 MED ORDER — AMIODARONE HCL 200 MG PO TABS: 100.0000 mg | ORAL_TABLET | Freq: Every day | ORAL | 1 refills | Status: AC

## 2023-08-22 NOTE — Telephone Encounter (Signed)
 Received call from pharmacy stating that they are on back order for amiodarone  100mg  tabs. Sent in new script for amiodarone  200mg  tabs with the instructions to take 1/2 tab daily.

## 2023-08-22 NOTE — Progress Notes (Signed)
 PCP: Rolan Maryanne HERO, MD HF Cardiology: Dr. Rolan  Chief complaint: CHF  73 y.o. with history of bicuspid aortic valve s/p mechanical AVR, paroxysmal atrial fibrillation, and chronic systolic CHF presents for evaluation of CHF.  In 6/24, patient was admitted with atypical atrial flutter with RVR. Patient had St Jude mechanical AVR in 2011. Echo in 1/24 showed EF 45-50%. Patient noted palpitations for about 1 week prior to admission. He checked his HR and found it to be in the 130s. He went to see his PCP and was in atrial fibrillation vs flutter with RVR and was sent to ER. In the ER, he was in what appeared to be atypical flutter with RVR. Amiodarone  gtt was started. CXR showed evidence for CHF. Mildly elevated HS-TnI with no trend. Echo during this admission showed EF 25-30%, moderate LV dilation, mild RV dysfunction, mechanical aortic valve functioning normally.  He was cardioverted back to NSR.    Repeat echo in 8/24 in NSR showed EF 30-35%, global hypokinesis, mild LVH, normal RV, mild MR, mechanical aortic valve functioning normally.   LHC/RHC in 9/24 showed PCWP 5, CI 2.81; normal coronaries.   In 1/25, atrial fibrillation ablation was attempted but failed due to interrupted IVC.  Echo in 1/25 showed EF 30-35% with mild RV dysfunction, stable mechanical aortic valve.   Patient had placement of Abbott CRT-D in 3/25.    Echo in 7/25 showed EF 30-35%, moderate diffuse hypokinesis, normal RV size/systolic function, mild MR, stable mechanical aortic valve mean gradient 6 mmHg.   Patient returns for followup of CHF.  He is doing well symptomatically though EF does not appear to have changed post-CRT placement.  No chest pain.  Able to walk his dogs 4 blocks without dyspnea.  No problem with ADLs.  No lightheadedness.  No BRBPR/melena. No palpitations, he is in NSR today.   Labs (7/24): K 4.3, creatinine 1.25, hgb 14.9, LFTs normal, TSH normal Labs (8/24): K 4.3, creatinine 1.06, BNP 56, LFTs  normal  ECG (personally reviewed, 8/24): A-V dual pacing  Abbott device interrogation: 77% a-paced, >99% BiV paced, stable thoracic impedance.   PMH: 1. Bicuspid aortic valve: s/p St Jude mechanical AVR in 2011.  - CTA chest in 8/24 showed stable repaired aorta 2. Hyperlipidemia 3. 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 OSA 5. HTN: ACEI cough 6. Type 2 diabetes 7. Atrial fibrillation: Paroxysmal.  - Atrial fibrillation ablation failed 1/25 due to interrupted IVC.  8. Chronic LBBB 9. Chronic systolic CHF:  - Echo (1/23): EF 45-50% - Echo (6/24) with EF 25-30%, moderate LV dilation, mild RV dysfunction, mechanical aortic valve functioning normally.  - Echo (8/24): EF 30-35%, global hypokinesis, mild LVH, normal RV, mild MR, mechanical aortic valve functioning normally.  - LHC/RHC (9/24): mean RA 8, PA 28/10 mean 21, mean PCWP 5, CI 2.81; normal coronaries.  - Echo (1/25): EF 30-35% with mild RV dysfunction, stable mechanical aortic valve.  - Abbott CRT-D device placed 3/25.  - Echo (7/25): EF 30-35%, moderate diffuse hypokinesis, normal RV size/systolic function, mild MR, stable mechanical aortic valve, mean gradient 6 mmHg.  10. Interrupted IVC.   Social History   Socioeconomic History   Marital status: Married    Spouse name: Not on file   Number of children: 2   Years of education: Not on file   Highest education level: Not on file  Occupational History   Occupation: mech. supervisor    Employer: [employer]    Comment: [place]  Tobacco Use   Smoking status: Never   Smokeless tobacco: Never  Vaping Use   Vaping status: Never Used  Substance and Sexual Activity   Alcohol use: No    Alcohol/week: 0.0 standard drinks of alcohol   Drug use: No   Sexual activity: Not on file  Other Topics Concern   Not on file  Social History Narrative   Married 1996   Social Drivers of Health   Financial Resource Strain: Low Risk  (01/24/2023)   Overall  Financial Resource Strain (CARDIA)    Difficulty of Paying Living Expenses: Not hard at all  Food Insecurity: No Food Insecurity (04/25/2023)   Hunger Vital Sign    Worried About Running Out of Food in the Last Year: Never true    Ran Out of Food in the Last Year: Never true  Transportation Needs: No Transportation Needs (04/25/2023)   PRAPARE - Administrator, Civil Service (Medical): No    Lack of Transportation (Non-Medical): No  Physical Activity: Sufficiently Active (01/24/2023)   Exercise Vital Sign    Days of Exercise per Week: 6 days    Minutes of Exercise per Session: 30 min  Stress: No Stress Concern Present (01/24/2023)   [employer] of Occupational Health - Occupational Stress Questionnaire    Feeling of Stress : Not at all  Social Connections: Moderately Integrated (01/24/2023)   Social Connection and Isolation Panel    Frequency of Communication with Friends and Family: Three times a week    Frequency of Social Gatherings with Friends and Family: Twice a week    Attends Religious Services: More than 4 times per year    Active Member of [employer] or Organizations: No    Attends Banker Meetings: Never    Marital Status: Married  Catering manager Violence: Not At Risk (04/25/2023)   Humiliation, Afraid, Rape, and Kick questionnaire    Fear of Current or Ex-Partner: No    Emotionally Abused: No    Physically Abused: No    Sexually Abused: No   Family History  Problem Relation Age of Onset   Obesity Mother        bedridden   Hypertension Mother    Heart disease Father        CABG, CAD   Hypertension Father    Lymphoma Father    Cancer Father        lymphoma   Alcohol abuse Brother    Cancer Brother        melanoma   Colon cancer Paternal Aunt    Stomach cancer Neg Hx    Rectal cancer Neg Hx    Prostate cancer Neg Hx    ROS: All systems reviewed and negative except as per HPI.   Current Outpatient Medications  Medication Sig  Dispense Refill   ACCU-CHEK AVIVA PLUS test strip USE AS DIRECTED TO CHECK BLOOD SUGAR TWO TO FOUR TIMES A DAY 100 strip 11   Accu-Chek FastClix Lancets MISC USE AS DIRECTED TO CHECK BLOOD SUGAR TWICE DAILY 102 each 4   azelastine  (ASTELIN ) 0.1 % nasal spray Place 2 sprays into both nostrils 2 (two) times daily as needed for rhinitis. Use in each nostril as directed 30 mL 12   carvedilol  (COREG ) 6.25 MG tablet Take 1 tablet (6.25 mg total) by mouth 2 (two) times daily. 180 tablet 2   cetirizine (ZYRTEC) 10 MG tablet Take 10 mg by mouth daily as needed for allergies.     empagliflozin  (  JARDIANCE ) 10 MG TABS tablet Take 1 tablet (10 mg total) by mouth daily. 90 tablet 3   FIBER GUMMIES PO Take 1 capsule by mouth daily.     fluocinonide  cream (LIDEX ) 0.05 % APPLY 1 APPLICATION TO AFFECTEDE AREAS 2 TIMES DAILY AS NEEDED 30 g 1   glipiZIDE  (GLUCOTROL ) 5 MG tablet 1 tab if AM sugar is above 150, 2 tabs if AM sugar is above 175. If sugar is below 150, skip dose.     Lancets Misc. (ACCU-CHEK FASTCLIX LANCET) KIT AS DIRECTED 1 kit 0   metFORMIN  (GLUCOPHAGE ) 500 MG tablet TAKE TWO TABLETS BY MOUTH TWO TIMES DAILY 360 tablet 3   omeprazole  (PRILOSEC ) 20 MG capsule TAKE ONE CAPSULE BY MOUTH ONCE DAILY 30 capsule 0   pantoprazole  (PROTONIX ) 40 MG tablet Take 40 mg by mouth daily.     pravastatin  (PRAVACHOL ) 20 MG tablet TAKE ONE TABLET BY MOUTH ONCE A DAY 90 tablet 1   sacubitril -valsartan  (ENTRESTO ) 97-103 MG Take 1 tablet by mouth 2 (two) times daily. 180 tablet 3   spironolactone  (ALDACTONE ) 25 MG tablet Take 1 tablet (25 mg total) by mouth daily. 90 tablet 3   tamsulosin  (FLOMAX ) 0.4 MG CAPS capsule Take 2 capsules (0.8 mg total) by mouth daily. 90 capsule 3   warfarin (COUMADIN ) 3 MG tablet TAKE ONE TABLET BY MOUTH DAILY OR AS DIRECTED BY ANTICOAGULATION CLINIC 100 tablet 1   amiodarone  (PACERONE ) 200 MG tablet Take 0.5 tablets (100 mg total) by mouth daily. 45 tablet 1   No current  facility-administered medications for this visit.   BP 122/63   Pulse 60   Wt 225 lb (102.1 kg)   SpO2 97%   BMI 33.32 kg/m  General: NAD Neck: No JVD, no thyromegaly or thyroid  nodule.  Lungs: Clear to auscultation bilaterally with normal respiratory effort. CV: Nondisplaced PMI.  Heart regular S1/S2 with mechanical S2, no S3/S4, 1/6 SEM RUSB murmur.  No peripheral edema.  No carotid bruit.  Normal pedal pulses.  Abdomen: Soft, nontender, no hepatosplenomegaly, no distention.  Skin: Intact without lesions or rashes.  Neurologic: Alert and oriented x 3.  Psych: Normal affect. Extremities: No clubbing or cyanosis.  HEENT: Normal.   Assessment/plan: 1. Atrial arrhythmias: Atypical atrial flutter at 6/24 admission, had had atrial fibrillation peri-aortic valve surgery. Atrial flutter with RVR may have contributed to worsening of EF as a tachycardia-mediated CMP, but EF has not fully recovered in NSR.  Atrial fibrillation/flutter ablation was attempted in 1/25 but failed due to interrupted IVC.  He is now in NSR on amiodarone .  - I think we can decrease amiodarone  to 100 mg daily.  Check LFTs and TSH. He will need a regular eye exam.  - Continue warfarin with mechanical AoV, goal INR with atrial fibrillation has been 2.5-3.5.  2. Acute on chronic systolic CHF: Echo in 1/24 with EF 45-50%.  Has baseline LBBB.  Echo in 6/24 in setting of AFL with RVR with EF 25-30%, moderate LV dilation with global HK, moderate LVH, mildly decreased RV systolic function, normally functioning mechanical AoV. It is possible that drop in EF was tachycardia-mediated with atypical atrial flutter/RVR of uncertain duration.  However, EF did not fully recover on 8/24 echo in NSR, which showed EF 30-35%, global hypokinesis, mild LVH, normal RV, mild MR, mechanical aortic valve functioning normally.  LHC/RHC in 9/24 showed no significant coronary disease.  Given wide LBBB, patient had Abbott St Jude CRT-D placed in 3/25.  He  is  appropriately BiV pacing.  Unfortunately, EF has remained low post-CRT, echo in 7/25 with EF 30-35%, moderate diffuse hypokinesis, normal RV size/systolic function, mild MR, stable mechanical aortic valve, mean gradient 6 mmHg.  He remains in NSR.  He is not volume overloaded by exam or [place], NYHA class I-II.  Despite persistently low EF, symptoms minimal.  - Continue Entresto  97/103 bid. BMET/BNP today.  - He does not need Lasix . - Increase Coreg  to 6.25 mg bid. Continue titrate up as tolerated.  - Continue Jardiance  10 mg daily.  - Continue spironolactone  25 mg daily.  3. St Jude mechanical aortic valve: h/o bicuspid aortic valve with AS.  Valve functioned normally on 7/25 echo.   - Continue warfarin, goal INR 2.5-3.5 given h/o atrial fibrillation.   4. OSA: has not tolerated CPAP.  5. Ascending aortic aneurysm: Has history of dilated ascending aorta in setting of bicuspid aortic valve.  Stable repaired aorta on CTA chest in 8/24.   Followup 3 months   I spent 32 minutes reviewing records, interviewing/examining patient, and managing orders.   Ezra Shuck 08/22/2023

## 2023-09-08 ENCOUNTER — Encounter: Payer: Self-pay | Admitting: Family Medicine

## 2023-09-08 ENCOUNTER — Ambulatory Visit

## 2023-09-08 DIAGNOSIS — I5020 Unspecified systolic (congestive) heart failure: Secondary | ICD-10-CM

## 2023-09-08 LAB — CUP PACEART REMOTE DEVICE CHECK
Battery Remaining Longevity: 71 mo
Battery Remaining Percentage: 92 %
Battery Voltage: 2.99 V
Brady Statistic AP VP Percent: 80 %
Brady Statistic AP VS Percent: 1 %
Brady Statistic AS VP Percent: 20 %
Brady Statistic AS VS Percent: 1 %
Brady Statistic RA Percent Paced: 80 %
Date Time Interrogation Session: 20250811021324
HighPow Impedance: 69 Ohm
Implantable Lead Connection Status: 753985
Implantable Lead Connection Status: 753985
Implantable Lead Connection Status: 753985
Implantable Lead Implant Date: 20250328
Implantable Lead Implant Date: 20250328
Implantable Lead Implant Date: 20250328
Implantable Lead Location: 753858
Implantable Lead Location: 753859
Implantable Lead Location: 753860
Implantable Pulse Generator Implant Date: 20250328
Lead Channel Impedance Value: 460 Ohm
Lead Channel Impedance Value: 510 Ohm
Lead Channel Impedance Value: 550 Ohm
Lead Channel Pacing Threshold Amplitude: 0.625 V
Lead Channel Pacing Threshold Amplitude: 0.75 V
Lead Channel Pacing Threshold Amplitude: 2.25 V
Lead Channel Pacing Threshold Pulse Width: 0.3 ms
Lead Channel Pacing Threshold Pulse Width: 0.5 ms
Lead Channel Pacing Threshold Pulse Width: 0.8 ms
Lead Channel Sensing Intrinsic Amplitude: 11.8 mV
Lead Channel Sensing Intrinsic Amplitude: 4.2 mV
Lead Channel Setting Pacing Amplitude: 1.625
Lead Channel Setting Pacing Amplitude: 1.75 V
Lead Channel Setting Pacing Amplitude: 2.75 V
Lead Channel Setting Pacing Pulse Width: 0.3 ms
Lead Channel Setting Pacing Pulse Width: 0.8 ms
Lead Channel Setting Sensing Sensitivity: 0.5 mV
Pulse Gen Serial Number: 211038480
Zone Setting Status: 755011

## 2023-09-09 ENCOUNTER — Ambulatory Visit: Payer: Self-pay | Admitting: Cardiology

## 2023-09-09 ENCOUNTER — Ambulatory Visit: Payer: Medicare PPO | Admitting: Family Medicine

## 2023-09-11 ENCOUNTER — Ambulatory Visit

## 2023-09-11 DIAGNOSIS — Z7901 Long term (current) use of anticoagulants: Secondary | ICD-10-CM | POA: Diagnosis not present

## 2023-09-11 LAB — POCT INR: INR: 2.9 (ref 2.0–3.0)

## 2023-09-11 NOTE — Progress Notes (Signed)
 Continue 3 mg daily. Recheck in 6 weeks.

## 2023-09-11 NOTE — Patient Instructions (Addendum)
Pre visit review using our clinic review tool, if applicable. No additional management support is needed unless otherwise documented below in the visit note.  Continue 3 mg daily. Recheck in 6 weeks.

## 2023-09-12 ENCOUNTER — Ambulatory Visit: Admitting: Family Medicine

## 2023-09-12 ENCOUNTER — Other Ambulatory Visit: Payer: Self-pay | Admitting: Family Medicine

## 2023-09-23 ENCOUNTER — Encounter: Payer: Self-pay | Admitting: Family Medicine

## 2023-09-23 ENCOUNTER — Ambulatory Visit: Admitting: Family Medicine

## 2023-09-23 ENCOUNTER — Ambulatory Visit: Attending: Cardiology | Admitting: Cardiology

## 2023-09-23 VITALS — BP 138/62 | HR 60 | Ht 71.0 in | Wt 226.6 lb

## 2023-09-23 VITALS — BP 118/64 | HR 63 | Temp 97.8°F | Ht 68.9 in | Wt 226.8 lb

## 2023-09-23 DIAGNOSIS — E119 Type 2 diabetes mellitus without complications: Secondary | ICD-10-CM

## 2023-09-23 DIAGNOSIS — I420 Dilated cardiomyopathy: Secondary | ICD-10-CM | POA: Diagnosis not present

## 2023-09-23 DIAGNOSIS — I4819 Other persistent atrial fibrillation: Secondary | ICD-10-CM | POA: Diagnosis not present

## 2023-09-23 DIAGNOSIS — Z95810 Presence of automatic (implantable) cardiac defibrillator: Secondary | ICD-10-CM | POA: Diagnosis not present

## 2023-09-23 DIAGNOSIS — I447 Left bundle-branch block, unspecified: Secondary | ICD-10-CM

## 2023-09-23 DIAGNOSIS — I5020 Unspecified systolic (congestive) heart failure: Secondary | ICD-10-CM | POA: Diagnosis not present

## 2023-09-23 LAB — POCT GLYCOSYLATED HEMOGLOBIN (HGB A1C): Hemoglobin A1C: 6.6 % — AB (ref 4.0–5.6)

## 2023-09-23 LAB — MICROALBUMIN / CREATININE URINE RATIO
Creatinine,U: 79.9 mg/dL
Microalb Creat Ratio: 21.5 mg/g (ref 0.0–30.0)
Microalb, Ur: 1.7 mg/dL (ref 0.0–1.9)

## 2023-09-23 NOTE — Progress Notes (Unsigned)
 Diabetes:  Using medications without difficulties: yes Hypoglycemic episodes:no Hyperglycemic episodes:no Feet problems:R1st toe ingrown nail over the last month, after trimming close.  Still on warfarin.   Blood Sugars averaging: usually ~110-130s.   eye exam within last year: yes A1c 6.6.   Discussed with patient about eye clinic follow-up.  See after visit summary.  Meds, vitals, and allergies reviewed.   ROS: Per HPI unless specifically indicated in ROS section   GEN: nad, alert and oriented HEENT: ncat NECK: supple w/o LA CV: rrr.  Valve click noted at baseline. PULM: ctab, no inc wob ABD: soft, +bs EXT: no edema SKIN: chronic BLE skin changes.   Diabetic foot exam: Normal inspection No skin breakdown No calluses  Normal DP pulses Normal sensation to light touch and monofilament Nails normal except for R 1st nail trimmed close medially but not ttp and doesn't have leading edge ingrown

## 2023-09-23 NOTE — Patient Instructions (Addendum)
 Go to the lab on the way out.   If you have mychart we'll likely use that to update you.     Take care.  Glad to see you.  Recheck at yearly visit around Feb 2026.  Labs ahead of time if possible.   Please call the eye clinic and tell them you are on amiodarone .   The nail should grow out slowly.  I would trim it straight across.  It should heal. Update me as needed.    I would get a flu shot each fall.

## 2023-09-23 NOTE — Progress Notes (Signed)
 Electrophysiology Clinic Note    Date:  09/23/2023  Patient ID:  [name] [name], [name] [name] 1950/10/05, MRN [id number] PCP:  [name], MD  Cardiologist:  [name], MD HF cardiologist - Rolan  Electrophysiologist:  [name], MD  Electrophysiology APP:  Francessca Friis, NP    Discussed the use of AI scribe software for clinical note transcription with the patient, who gave verbal consent to proceed.   Patient Profile    Chief Complaint: BiV ICD follow-up  History of Present Illness: [name] [name] is a 73 y.o. male with PMH notable for persis Afib, aflutter, HFrEF, NICM, s/p CRT-D, bicuspid AV s/p mechanical AVR ; seen today for [name], MD for acute visit due to ongoing reduced LVEF.    He is s/p aborted AF ablation d/t interrupted IVC.   He is s/p BiV ICD implant 03/2023. He had updated TTE 07/2023 that showed no improvement in LVEF, so it was recommended he come to clinic to optimize his device  On follow-up today, he continues to have reduced energy and decreased exercise tolerance. He had 3 dogs and walks them each 2 blocks during the day, but is otherwise mostly sedentary. He denies increased swelling, abd distention. Denies chest pain, chest pressure, palpitations.   He continues to take eliquis BID, no bleeding concerns.      Arrhythmia/Device History Abbott CRT-D, imp 03/2023; dx HF, LBBB   AAD - Amiodarone     ROS:  Please see the history of present illness. All other systems are reviewed and otherwise negative.    Physical Exam    VS:  BP 138/62 (BP Location: Left Arm, Patient Position: Sitting, Cuff Size: Normal)   Pulse 60   Ht 5' 11 (1.803 m)   Wt 226 lb 9.6 oz (102.8 kg)   SpO2 98%   BMI 31.60 kg/m  BMI: Body mass index is 31.6 kg/m.      Wt Readings from Last 3 Encounters:  09/23/23 226 lb 9.6 oz (102.8 kg)  09/23/23 226 lb 12.8 oz (102.9 kg)  08/21/23 225 lb (102.1 kg)     GEN- The patient is well  appearing, alert and oriented x 3 today.   Lungs- Clear to ausculation bilaterally, normal work of breathing.  Heart- Regular rate and rhythm, no murmurs, rubs or gallops Extremities- No peripheral edema, warm, dry Skin-  device pocket well-healed, no tethering   Device interrogation done today and reviewed by myself:  Battery good  Extensive device adjustments made Final parameters: LV > RV 60ms offset, with AV delay ~277ms   Studies Reviewed   Previous EP, cardiology notes.    EKG is ordered. Personal review of EKG from today shows:   EKG after final device adjustments is positive in V1 with q-wave in lead 1, QRS duration         TTE, 02/18/2023  1. Left ventricular ejection fraction, by estimation, is 30 to 35%. The left ventricle has moderately decreased function. The left ventricle demonstrates global hypokinesis. There is mild left ventricular hypertrophy. Left ventricular diastolic parameters are consistent with Grade I diastolic dysfunction (impaired relaxation).   2. Right ventricular systolic function is mildly reduced. The right ventricular size is normal.   3. The mitral valve is normal in structure. No evidence of mitral valve regurgitation. No evidence of mitral stenosis.   4. The aortic valve has been repaired/replaced. Aortic valve regurgitation is not visualized. No aortic stenosis is present. There is a  St. Jude mechanical valve present in the aortic position. Procedure Date: 06/2009 . Echo findings are consistent with normal structure and function of the aortic valve prosthesis. Aortic valve area, by VTI measures 1.72 cm. Aortic valve mean gradient measures 6.5 mmHg. Aortic valve Vmax measures 1.76 m/s.   5. The inferior vena cava is normal in size with greater than 50% respiratory variability, suggesting right atrial pressure of 3 mmHg.   Comparison(s): No significant change from prior study. Prior images reviewed side by side. The left ventricular function is  unchanged. EF 30%, mild LVH, AOV mean 7.5, peak 14.7.      Cardiac CT, 01/30/2023 1. There is normal pulmonary vein drainage into the left atrium with ostial measurements above.  2. There is no thrombus in the left atrial appendage.  3. The esophagus runs in the left atrial midline and is not in proximity to any of the pulmonary vein ostia.  4. No PFO/ASD.  5. Normal coronary origin. Right dominance.  6. CAC score of 0 which is 1st percentile for age-, race-, and sex-matched controls.  7. Aortic atherosclerosis.  8.  Aortic valve replacement present.    Assessment and Plan     #) NICM #) LBBB #) CRT-D in situ Updated TTE with ongoing reduced LVEF after CRT-D implanted Extensive device optimization today with St. Jude rep See paceart for details Will plan updated limited TTE in ~6 weeks   #) persis AFib #) aflutter #) amiodarone  monitoring S/p aborted AF ablation d/t interrupted IVC On amiodarone  without arrhythmia episodes Continue 200mg  amiodarone  daily Recent LFTs, thyroid  labs stable  #) Hypercoag d/t afib CHA2DS2-VASc Score = at least 4 [CHF History: 1, HTN History: 1, Diabetes History: 1, Stroke History: 0, Vascular Disease History: 0, Age Score: 1, Gender Score: 0].  Therefore, the patient's annual risk of stroke is 4.8 %.    Stroke ppx - warfarin, managed at PCP office No bleeding concerns         Current medicines are reviewed at length with the patient today.   The patient does not have concerns regarding his medicines.  The following changes were made today:  none  Labs/ tests ordered today include:  Orders Placed This Encounter  Procedures   EKG 12-Lead   EKG 12-Lead   ECHOCARDIOGRAM LIMITED     Disposition: Follow up with Dr. Cindie or EP APP in 6 months   I spent 40 minutes in face-to-face care of the patient today including device interrogation and readjustments  Signed, Chantal Needle, NP  09/23/23  1:46 PM  Electrophysiology [facility]

## 2023-09-23 NOTE — Patient Instructions (Addendum)
 Medication Instructions:  Your physician recommends that you continue on your current medications as directed. Please refer to the Current Medication list given to you today.    *If you need a refill on your cardiac medications before your next appointment, please call your pharmacy*  Lab Work: No labs ordered today    Testing/Procedures: Your physician has requested that you have an Limited Echocardiogram in 6-8 weeks. Echocardiography is a painless test that uses sound waves to create images of your heart. It provides your doctor with information about the size and shape of your heart and how well your heart's chambers and valves are working.   You may receive an ultrasound enhancing agent through an IV if needed to better visualize your heart during the echo. This procedure takes approximately one hour.  There are no restrictions for this procedure.  This will take place at [address] [facility]) [address], [place] [address]  Please note: We ask at that you not bring children with you during ultrasound (echo/ vascular) testing. Due to room size and safety concerns, children are not allowed in the ultrasound rooms during exams. Our front office staff cannot provide observation of children in our lobby area while testing is being conducted. An adult accompanying a patient to their appointment will only be allowed in the ultrasound room at the discretion of the ultrasound technician under special circumstances. We apologize for any inconvenience.   Follow-Up: At [facility], you and your health needs are our priority.  As part of our continuing mission to provide you with exceptional heart care, our providers are all part of one team.  This team includes your primary Cardiologist (physician) and Advanced Practice Providers or APPs (Physician Assistants and Nurse Practitioners) who all work together to provide you with the care you need, when you need it.  Your next  appointment:   1 year(s)  Provider:   [name] [name], NP    Your physician recommends that you schedule a follow-up appointment in 3 months with Dr. Mclean

## 2023-09-24 ENCOUNTER — Ambulatory Visit: Payer: Self-pay | Admitting: Family Medicine

## 2023-09-24 NOTE — Assessment & Plan Note (Signed)
 A1c 6.6.  Recheck at yearly visit around Feb 2026.  Labs ahead of time if possible.  He has a reassuring foot exam.  The nail should grow out slowly.  I would trim it straight across.  It should heal. Update me as needed.  He agrees with plan. Continue Jardiance  glipizide  and metformin  in the meantime.

## 2023-09-30 ENCOUNTER — Other Ambulatory Visit: Payer: Self-pay | Admitting: Family Medicine

## 2023-09-30 NOTE — Telephone Encounter (Signed)
 Copied from CRM [id number]. Topic: Clinical - Lab/Test Results >> Sep 30, 2023  3:51 PM Thersia C wrote: Reason for CRM: Patient called in regarding lab results, relay message to patient had no further questions and understood

## 2023-10-02 ENCOUNTER — Encounter: Payer: Self-pay | Admitting: Cardiology

## 2023-10-03 ENCOUNTER — Other Ambulatory Visit: Payer: Self-pay | Admitting: Family Medicine

## 2023-10-03 ENCOUNTER — Telehealth: Payer: Self-pay

## 2023-10-03 MED ORDER — TAMSULOSIN HCL 0.4 MG PO CAPS: 0.8000 mg | ORAL_CAPSULE | Freq: Every day | ORAL | 2 refills | Status: AC

## 2023-10-03 NOTE — Telephone Encounter (Signed)
 Return call to [place] at the pharmacy and refill has been sent that will last the patient 90 days

## 2023-10-03 NOTE — Telephone Encounter (Signed)
 Copied from CRM #[id number]. Topic: Clinical - Medication Refill >> Oct 03, 2023  2:47 PM [place] J wrote: Medication: tamsulosin  (FLOMAX ) 0.4 MG CAPS capsule  Has the patient contacted their pharmacy? Yes (Agent: If no, request that the patient contact the pharmacy for the refill. If patient does not wish to contact the pharmacy document the reason why and proceed with request.) (Agent: If yes, when and what did the pharmacy advise?) Pharmacy calling for request.   This is the patient's preferred pharmacy:  [facility] - [place], [place] - [address] [address] Phone: [phone] Fax: [phone]  Is this the correct pharmacy for this prescription? Yes If no, delete pharmacy and type the correct one.   Has the prescription been filled recently? No  Is the patient out of the medication? Yes  Has the patient been seen for an appointment in the last year OR does the patient have an upcoming appointment? Yes  Can we respond through MyChart? Yes  Agent: Please be advised that Rx refills may take up to 3 business days. We ask that you follow-up with your pharmacy.

## 2023-10-03 NOTE — Telephone Encounter (Signed)
 Closing encounter issue has been addressed       Copied from CRM [id number]. Topic: Clinical - Medication Question >> Oct 03, 2023  4:12 PM Burnard DEL wrote: Reason for CRM: [place] from [facility] pharmacy called to check on status on flomax  medication  being sent to pharmacy.Patients wife is currently at pharmacy to pick up.Patient is out.

## 2023-10-03 NOTE — Telephone Encounter (Signed)
 [name] [name] calling form [employer] wanted to let the provider know that the 90-day supply originally requested with the amount of capsules in the bottle currently, will only last the patient 45 days with the way the patient is taking medication per directions. If made for 180  that will make his medication last 90-days as wanted. This is also why they are requesting the medication so soon.   The pharmacist also stated that the patient is compeletly out of medication and his wife is on the way to pick up medication soon. He said that an emergency refill will be provided for them as the last option and if there are any questions, please call the pharmacy as request to speak with [place].

## 2023-10-16 ENCOUNTER — Encounter: Admitting: Cardiology

## 2023-10-23 ENCOUNTER — Ambulatory Visit

## 2023-10-23 DIAGNOSIS — Z7901 Long term (current) use of anticoagulants: Secondary | ICD-10-CM | POA: Diagnosis not present

## 2023-10-23 LAB — POCT INR: INR: 3.2 — AB (ref 2.0–3.0)

## 2023-10-23 NOTE — Progress Notes (Signed)
 Continue 3 mg daily. Recheck in 6 weeks.

## 2023-10-23 NOTE — Patient Instructions (Addendum)
Pre visit review using our clinic review tool, if applicable. No additional management support is needed unless otherwise documented below in the visit note.  Continue 3 mg daily. Recheck in 6 weeks.

## 2023-10-24 NOTE — Progress Notes (Signed)
Remote ICD Transmission.

## 2023-11-10 ENCOUNTER — Other Ambulatory Visit: Payer: Self-pay | Admitting: Family Medicine

## 2023-11-11 ENCOUNTER — Ambulatory Visit: Payer: Self-pay | Admitting: Cardiology

## 2023-11-11 ENCOUNTER — Ambulatory Visit: Attending: Cardiology

## 2023-11-11 ENCOUNTER — Other Ambulatory Visit: Payer: Self-pay | Admitting: Cardiology

## 2023-11-11 DIAGNOSIS — Z95810 Presence of automatic (implantable) cardiac defibrillator: Secondary | ICD-10-CM

## 2023-11-11 DIAGNOSIS — I5020 Unspecified systolic (congestive) heart failure: Secondary | ICD-10-CM

## 2023-11-11 DIAGNOSIS — I4819 Other persistent atrial fibrillation: Secondary | ICD-10-CM

## 2023-11-11 DIAGNOSIS — I420 Dilated cardiomyopathy: Secondary | ICD-10-CM

## 2023-11-11 DIAGNOSIS — I447 Left bundle-branch block, unspecified: Secondary | ICD-10-CM

## 2023-11-11 LAB — ECHOCARDIOGRAM COMPLETE
AR max vel: 2.93 cm2
AV Area VTI: 2.97 cm2
AV Area mean vel: 2.91 cm2
AV Mean grad: 5 mmHg
AV Peak grad: 9.2 mmHg
Ao pk vel: 1.52 m/s
Area-P 1/2: 3.27 cm2
S' Lateral: 3.6 cm

## 2023-11-24 ENCOUNTER — Other Ambulatory Visit: Payer: Self-pay | Admitting: Family Medicine

## 2023-11-24 DIAGNOSIS — E119 Type 2 diabetes mellitus without complications: Secondary | ICD-10-CM

## 2023-11-24 DIAGNOSIS — J310 Chronic rhinitis: Secondary | ICD-10-CM

## 2023-11-25 ENCOUNTER — Other Ambulatory Visit: Payer: Self-pay | Admitting: Cardiology

## 2023-11-25 ENCOUNTER — Other Ambulatory Visit: Payer: Self-pay | Admitting: Family Medicine

## 2023-11-25 DIAGNOSIS — E785 Hyperlipidemia, unspecified: Secondary | ICD-10-CM

## 2023-11-26 NOTE — Progress Notes (Unsigned)
 Cardiology Office Note:   Date:  11/27/2023  ID:  [name] [name], DOB 08-05-1950, MRN [id number] PCP: [name], MD  [facility] Providers Cardiologist:  [name], MD Electrophysiologist:  [name], MD  Electrophysiology APP:  [name], [name], NP {  History of Present Illness:   [name] [name] is a 73 y.o. male who presents for follow up after aortic valve replacement.   In June of this year he had a reduced EF compared with previous and I sent him for right and left heart Cath.  The valve function was normal but EF was now down to 25%.  Cath demonstrated minimal coronary disease.   Right heart pressures were normal.  He had persistent atrial fib thought to be related to the reduced EF.  He was cardioverted and is scheduled for ablation.  After cardioversion his ejection fraction was up to 35%.  In March 2025 he had CRT -D implanted.  He has been maintained with NSR on amio after an aborted ablation attempt secondary to interrupted IVC.  His EF in August was 40 - 45%.      He returns for follow up.   He has been feeling well since he was last seen.  This summer he was able to push a lawnmower.  He walks 3 dogs 6 blocks routinely. The patient denies any new symptoms such as chest discomfort, neck or arm discomfort. There has been no new shortness of breath, PND or orthopnea. There have been no reported palpitations, presyncope or syncope.   At the last visit he did have his carvedilol  increased slightly.  He had close follow-up of his device with adjustments to the settings.  ROS: As stated in the HPI and negative for all other systems.  Studies Reviewed:    EKG:     NA  Risk Assessment/Calculations:    CHA2DS2-VASc Score = 4   This indicates a 4.8% annual risk of stroke. The patient's score is based upon: CHF History: 1 HTN History: 1 Diabetes History: 1 Stroke History: 0 Vascular Disease History: 0 Age Score: 1 Gender Score: 0   Physical Exam:    VS:  BP 128/78   Pulse 60   Ht 5' 11 (1.803 m)   Wt 224 lb 12.8 oz (102 kg)   SpO2 95%   BMI 31.35 kg/m    Wt Readings from Last 3 Encounters:  11/27/23 224 lb 12.8 oz (102 kg)  09/23/23 226 lb 9.6 oz (102.8 kg)  09/23/23 226 lb 12.8 oz (102.9 kg)     GEN: Well nourished, well developed in no acute distress NECK: No JVD; No carotid bruits CARDIAC: RRR, mechanical S2, no murmurs, rubs, gallops RESPIRATORY:  Clear to auscultation without rales, wheezing or rhonchi  ABDOMEN: Soft, non-tender, non-distended EXTREMITIES:  Mild bilateral leg edema; No deformity   ASSESSMENT AND PLAN:   Atrial arrhythmias/ Atypical atrial flutter:  He failed DCCV because of an interrupted IVC.  He has maintained NSR on amio.  I reviewed his most recent device interrogation.  He will continue on current meds.  Chronic systolic CHF: He seems to be euvolemic.  I am going to increase his Entresto  to 12-1/2 mg twice daily.    St Jude mechanical aortic valve:   He has normal functioning prosthetic valve on his recent echo.  He understands endocarditis prophylaxis.  He is compliant with his Coumadin .  No change in therapy.   OSA:   He has not tolerated CPAP in  the past although he has lost 30 pounds over time.  No change in therapy.   Ascending aortic aneurysm: He had stable repair in August 2024 on CT.  No further imaging at this time.      Follow up with APP in 2 months.   Signed, [name], MD

## 2023-11-27 ENCOUNTER — Ambulatory Visit: Attending: Internal Medicine | Admitting: Cardiology

## 2023-11-27 ENCOUNTER — Encounter: Payer: Self-pay | Admitting: Cardiology

## 2023-11-27 VITALS — BP 128/78 | HR 60 | Ht 71.0 in | Wt 224.8 lb

## 2023-11-27 DIAGNOSIS — I4891 Unspecified atrial fibrillation: Secondary | ICD-10-CM | POA: Diagnosis not present

## 2023-11-27 DIAGNOSIS — Z952 Presence of prosthetic heart valve: Secondary | ICD-10-CM

## 2023-11-27 DIAGNOSIS — I5022 Chronic systolic (congestive) heart failure: Secondary | ICD-10-CM | POA: Diagnosis not present

## 2023-11-27 MED ORDER — CARVEDILOL 12.5 MG PO TABS: 12.5000 mg | ORAL_TABLET | Freq: Two times a day (BID) | ORAL | 3 refills | Status: DC

## 2023-11-27 NOTE — Patient Instructions (Signed)
 Medication Instructions:  Increase Carvedilol  to 12.5 mg twice daily *If you need a refill on your cardiac medications before your next appointment, please call your pharmacy*  Lab Work: CBC, BMET today at [facility] If you have labs (blood work) drawn today and your tests are completely normal, you will receive your results only by: MyChart Message (if you have MyChart) OR A paper copy in the mail If you have any lab test that is abnormal or we need to change your treatment, we will call you to review the results.  Testing/Procedures: NONE  Follow-Up: At [facility] you and your health needs are our priority.  As part of our continuing mission to provide you with exceptional heart care, our providers are all part of one team.  This team includes your primary Cardiologist (physician) and Advanced Practice Providers or APPs (Physician Assistants and Nurse Practitioners) who all work together to provide you with the care you need, when you need it.  Your next appointment:   2 month(s)  Provider:   One of our Advanced Practice Providers (APPs): [name] Clause, PA-C  Lamarr Satterfield, NP Miriam Shams, NP  Olivia Pavy, PA-C Josefa Beauvais, NP  Leontine Salen, PA-C Orren Fabry, PA-C  Hao Meng, PA-C Ernest Dick, NP  Damien Braver, NP Jon Hails, PA-C  Waddell Donath, PA-C    Dayna Dunn, PA-C  Scott Weaver, PA-C Lum Louis, NP Katlyn West, NP Callie Goodrich, PA-C  Xika Zhao, NP Sheng Haley, PA-C    Kathleen Johnson, PA-C    We recommend signing up for the patient portal called MyChart.  Sign up information is provided on this After Visit Summary.  MyChart is used to connect with patients for Virtual Visits (Telemedicine).  Patients are able to view lab/test results, encounter notes, upcoming appointments, etc.  Non-urgent messages can be sent to your provider as well.   To learn more about what you can do with MyChart, go to [email or web address].

## 2023-11-28 LAB — BASIC METABOLIC PANEL WITH GFR
BUN/Creatinine Ratio: 15 (ref 10–24)
BUN: 24 mg/dL (ref 8–27)
CO2: 19 mmol/L — ABNORMAL LOW (ref 20–29)
Calcium: 9.2 mg/dL (ref 8.6–10.2)
Chloride: 100 mmol/L (ref 96–106)
Creatinine, Ser: 1.56 mg/dL — ABNORMAL HIGH (ref 0.76–1.27)
Glucose: 104 mg/dL — ABNORMAL HIGH (ref 70–99)
Potassium: 4.8 mmol/L (ref 3.5–5.2)
Sodium: 136 mmol/L (ref 134–144)
eGFR: 47 mL/min/1.73 — ABNORMAL LOW (ref >59)

## 2023-11-28 LAB — CBC
Hematocrit: 45.1 % (ref 37.5–51.0)
Hemoglobin: 15 g/dL (ref 13.0–17.7)
MCH: 32 pg (ref 26.6–33.0)
MCHC: 33.3 g/dL (ref 31.5–35.7)
MCV: 96 fL (ref 79–97)
Platelets: 153 x10E3/uL (ref 150–450)
RBC: 4.69 x10E6/uL (ref 4.14–5.80)
RDW: 13.5 % (ref 11.6–15.4)
WBC: 5.9 x10E3/uL (ref 3.4–10.8)

## 2023-11-30 ENCOUNTER — Ambulatory Visit: Payer: Self-pay | Admitting: Cardiology

## 2023-11-30 DIAGNOSIS — I5022 Chronic systolic (congestive) heart failure: Secondary | ICD-10-CM

## 2023-12-04 ENCOUNTER — Ambulatory Visit

## 2023-12-04 DIAGNOSIS — I429 Cardiomyopathy, unspecified: Secondary | ICD-10-CM | POA: Diagnosis not present

## 2023-12-04 DIAGNOSIS — E1136 Type 2 diabetes mellitus with diabetic cataract: Secondary | ICD-10-CM | POA: Diagnosis not present

## 2023-12-04 DIAGNOSIS — I110 Hypertensive heart disease with heart failure: Secondary | ICD-10-CM | POA: Diagnosis not present

## 2023-12-04 DIAGNOSIS — Z7901 Long term (current) use of anticoagulants: Secondary | ICD-10-CM | POA: Diagnosis not present

## 2023-12-04 DIAGNOSIS — I509 Heart failure, unspecified: Secondary | ICD-10-CM | POA: Diagnosis not present

## 2023-12-04 DIAGNOSIS — M1990 Unspecified osteoarthritis, unspecified site: Secondary | ICD-10-CM | POA: Diagnosis not present

## 2023-12-04 DIAGNOSIS — D6869 Other thrombophilia: Secondary | ICD-10-CM | POA: Diagnosis not present

## 2023-12-04 DIAGNOSIS — I4892 Unspecified atrial flutter: Secondary | ICD-10-CM | POA: Diagnosis not present

## 2023-12-04 DIAGNOSIS — I4891 Unspecified atrial fibrillation: Secondary | ICD-10-CM | POA: Diagnosis not present

## 2023-12-04 DIAGNOSIS — E785 Hyperlipidemia, unspecified: Secondary | ICD-10-CM | POA: Diagnosis not present

## 2023-12-04 LAB — POCT INR: INR: 2.1 (ref 2.0–3.0)

## 2023-12-04 NOTE — Progress Notes (Addendum)
 Indication: Afib, mechanical aortic valve  Pt reports he started a heart healthy diet two weeks ago which incorporates more vegetables into diet. Pt has lost 9 lbs in two weeks. Advised pt to keep vitamin K containing foods consistent in his diet. Pt verbalized understanding.  Increase dose today and tomorrow to take 4.5 mg and then continue 3 mg daily. Recheck in 4 weeks, per pt requests due to holiday.

## 2023-12-04 NOTE — Patient Instructions (Addendum)
 Pre visit review using our clinic review tool, if applicable. No additional management support is needed unless otherwise documented below in the visit note.  Increase dose today and tomorrow to take 4.5 mg and then continue 3 mg daily. Recheck in 4 weeks.

## 2023-12-05 NOTE — Telephone Encounter (Signed)
-----   Message from [name] sent at 12/05/2023  8:01 AM EST ----- He needs to let us  know if these readings are that low and I might have to back off on meds.  ----- Message ----- From: Sanjuan Aleck SAILOR, RN Sent: 12/04/2023  10:53 AM EST To: [name], MD  The patient has been notified of the result . Aleck SAILOR Sanjuan, RN 12/04/2023 10:51 AM  Pt to get labs on 11/17. Pt stated his blood pressure has been low. 10/30-82/46, 11/3-83/54, 11/4-76/33. Pt denies any symptoms. Pt plans to get new blood pressure cuff and send us  more readings. Pt  was given ED precautions. Pt verbalized understanding. All questions if any were answered. ----- Message ----- From: Schilling Lynwood, MD Sent: 11/30/2023  12:13 PM EST To: Aleck SAILOR Sameul Tagle, RN  Creat is mildly elevated.  I would like to repeat this in two weeks.  Call Mr. Decarlo with the results and send results to [name], MD ----- Message ----- From: Interface, [facility] Lab Results In Sent: 11/27/2023  11:41 PM EST To: [name], MD

## 2023-12-05 NOTE — Telephone Encounter (Signed)
 Spoke with pt regarding his blood pressures. Pt stated he got a new bp cuff and his blood pressures have been higher. Pt stated the top number has been in the 100s and he is still not having any symptoms. Pt was told to take his blood pressure twice daily for 10 days and send us  the readings. Pt verbalized understanding. All questions if any were answered.

## 2023-12-08 ENCOUNTER — Ambulatory Visit (INDEPENDENT_AMBULATORY_CARE_PROVIDER_SITE_OTHER)

## 2023-12-08 DIAGNOSIS — I5022 Chronic systolic (congestive) heart failure: Secondary | ICD-10-CM

## 2023-12-08 LAB — CUP PACEART REMOTE DEVICE CHECK
Battery Remaining Longevity: 83 mo
Battery Remaining Percentage: 88 %
Battery Voltage: 2.99 V
Brady Statistic AP VP Percent: 91 %
Brady Statistic AP VS Percent: 1 %
Brady Statistic AS VP Percent: 8.3 %
Brady Statistic AS VS Percent: 1 %
Brady Statistic RA Percent Paced: 92 %
Date Time Interrogation Session: 20251110064149
HighPow Impedance: 71 Ohm
Implantable Lead Connection Status: 753985
Implantable Lead Connection Status: 753985
Implantable Lead Connection Status: 753985
Implantable Lead Implant Date: 20250328
Implantable Lead Implant Date: 20250328
Implantable Lead Implant Date: 20250328
Implantable Lead Location: 753858
Implantable Lead Location: 753859
Implantable Lead Location: 753860
Implantable Pulse Generator Implant Date: 20250328
Lead Channel Impedance Value: 480 Ohm
Lead Channel Impedance Value: 480 Ohm
Lead Channel Impedance Value: 550 Ohm
Lead Channel Pacing Threshold Amplitude: 0.75 V
Lead Channel Pacing Threshold Amplitude: 0.875 V
Lead Channel Pacing Threshold Amplitude: 1.625 V
Lead Channel Pacing Threshold Pulse Width: 0.3 ms
Lead Channel Pacing Threshold Pulse Width: 0.3 ms
Lead Channel Pacing Threshold Pulse Width: 0.8 ms
Lead Channel Sensing Intrinsic Amplitude: 11.8 mV
Lead Channel Sensing Intrinsic Amplitude: 3.8 mV
Lead Channel Setting Pacing Amplitude: 1.75 V
Lead Channel Setting Pacing Amplitude: 1.875
Lead Channel Setting Pacing Amplitude: 2.125
Lead Channel Setting Pacing Pulse Width: 0.3 ms
Lead Channel Setting Pacing Pulse Width: 0.8 ms
Lead Channel Setting Sensing Sensitivity: 0.5 mV
Pulse Gen Serial Number: 211038480
Zone Setting Status: 755011

## 2023-12-11 ENCOUNTER — Telehealth: Payer: Self-pay | Admitting: Cardiology

## 2023-12-11 NOTE — Telephone Encounter (Signed)
 Pt c/o BP issue: STAT if pt c/o blurred vision, one-sided weakness or slurred speech.  STAT if BP is GREATER than 180/120 TODAY.  STAT if BP is LESS than 90/60 and SYMPTOMATIC TODAY  1. What is your BP concern? BP too low  2. Have you taken any BP medication today?Yes  3. What are your last 5 BP readings?108/68 87/45   4. Are you having any other symptoms (ex. Dizziness, headache, blurred vision, passed out)? Dizziness

## 2023-12-11 NOTE — Progress Notes (Signed)
 Remote ICD Transmission

## 2023-12-11 NOTE — Telephone Encounter (Signed)
 Spoke with pt who is reporting his blood pressure has been low for several days since increasing his carvedilol  to 12.5 mg BID as instructed at last office visit.  This AM he reports BP was 87/45.  It has been below 100 systolic for the past 3 to 4 days HR has been in  the 60s.  Recheck PM BP on 11/11 was 121/65.  Pt reporting having dizziness but has not passed out.  Advised continue to monitor and I will send this information to Dr Lavona for his review.  Pt is aware he will be contacted with further instructions.

## 2023-12-14 ENCOUNTER — Ambulatory Visit: Payer: Self-pay | Admitting: Cardiology

## 2023-12-15 DIAGNOSIS — I5022 Chronic systolic (congestive) heart failure: Secondary | ICD-10-CM | POA: Diagnosis not present

## 2023-12-15 LAB — BASIC METABOLIC PANEL WITH GFR
BUN/Creatinine Ratio: 10 (ref 10–24)
BUN: 12 mg/dL (ref 8–27)
CO2: 21 mmol/L (ref 20–29)
Calcium: 9.3 mg/dL (ref 8.6–10.2)
Chloride: 105 mmol/L (ref 96–106)
Creatinine, Ser: 1.16 mg/dL (ref 0.76–1.27)
Glucose: 129 mg/dL — ABNORMAL HIGH (ref 70–99)
Potassium: 4.6 mmol/L (ref 3.5–5.2)
Sodium: 137 mmol/L (ref 134–144)
eGFR: 67 mL/min/1.73 (ref >59)

## 2023-12-15 NOTE — Telephone Encounter (Signed)
 Spoke with pt regarding Dr. Denver suggestions. Pt stated he has stopped taking his carvedilol  for the last few days because his pressures were low and he was feeling like he was going to pass out. Pt stated his blood pressures have been running 100s/60s since stopping the coreg  and he is feeling better. Pt stated if his blood pressure increases he will start with the 6.25 mg twice daily. Pt verbalized understanding. All questions if any were answered.

## 2023-12-27 ENCOUNTER — Other Ambulatory Visit: Payer: Self-pay | Admitting: Family Medicine

## 2023-12-27 DIAGNOSIS — K219 Gastro-esophageal reflux disease without esophagitis: Secondary | ICD-10-CM

## 2024-01-01 ENCOUNTER — Ambulatory Visit

## 2024-01-01 DIAGNOSIS — Z7901 Long term (current) use of anticoagulants: Secondary | ICD-10-CM | POA: Diagnosis not present

## 2024-01-01 LAB — POCT INR: INR: 2.3 (ref 2.0–3.0)

## 2024-01-01 NOTE — Progress Notes (Signed)
 Indication: Afib, mechanical aortic valve  Pt reports he started a heart healthy diet about 6 weeks ago which incorporates more proteins into his diet. Pt has lost 9 lbs in two weeks. Advised pt to keep protein consistent in his diet. Pt verbalized understanding.  Increase dose today to take 4.5 mg and then change weekly dose to take 3 mg daily except take 4.5 mg on Monday. Recheck in 2 weeks.

## 2024-01-01 NOTE — Patient Instructions (Addendum)
 Pre visit review using our clinic review tool, if applicable. No additional management support is needed unless otherwise documented below in the visit note.  Increase dose today to take 4.5 mg and then change weekly dose to take 3 mg daily except take 4.5 mg on Monday. Recheck in 2 weeks.

## 2024-01-05 ENCOUNTER — Other Ambulatory Visit: Payer: Self-pay | Admitting: Cardiology

## 2024-01-15 ENCOUNTER — Ambulatory Visit

## 2024-01-15 DIAGNOSIS — Z7901 Long term (current) use of anticoagulants: Secondary | ICD-10-CM

## 2024-01-15 LAB — POCT INR: INR: 3.1 — AB (ref 2.0–3.0)

## 2024-01-15 NOTE — Patient Instructions (Addendum)
 Pre visit review using our clinic review tool, if applicable. No additional management support is needed unless otherwise documented below in the visit note.  Continue  3 mg daily except take 5 mg on Monday. Recheck in 4 weeks.

## 2024-01-15 NOTE — Progress Notes (Signed)
 Indication: Afib, mechanical aortic valve  Pt reported he was not able to split the 3 mg tablet so he took 1 of the 3 mg tablets and 2 of the 1 mg tablets on Monday to make 5 mg instead of the 4.5 mg. Pt is in perfect range today so will continue dosing pt has been using.  Continue  3 mg daily except take 5 mg on Monday. Recheck in 4 weeks.

## 2024-01-20 ENCOUNTER — Other Ambulatory Visit: Payer: Self-pay | Admitting: Cardiology

## 2024-01-20 ENCOUNTER — Other Ambulatory Visit: Payer: Self-pay | Admitting: Family Medicine

## 2024-01-20 DIAGNOSIS — Z7901 Long term (current) use of anticoagulants: Secondary | ICD-10-CM

## 2024-01-20 NOTE — Progress Notes (Unsigned)
 " Cardiology Office Note:    Date:  01/27/2024   ID:  [name] [name], DOB [birth year], MRN [id number]  PCP:  [name], MD    [facility] Providers Cardiologist:  [name], MD Electrophysiologist:  [name], MD  Electrophysiology APP:  [name], [name], NP     Referring MD: [name], MD   Chief Complaint  Patient presents with   Follow-up    CHF, PAF    History of Present Illness:    [name] [name] is a 73 y.o. male with a hx of Saint Jude mechanical AVR in 2011, chronic systolic heart failure secondary to NICM, paroxysmal atrial fibrillation/flutter, BiV ICD in place, hypertension, hyperlipidemia, DM2, and GERD.  In June 2024 he was admitted with atypical atrial flutter with RVR.  Echocardiogram demonstrated LVEF 25-30% with moderate LV dilation and global hypokinesis, moderate LVH, mildly decreased RV systolic function and normally functioning mechanical aortic valve.  DCCV 07/24/2022 to NSR.  GDMT titrated and atrial fibrillation ablation was planned.  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  Right left heart catheterization demonstrated minimal coronary artery disease and good valve function.  Despite GDMT, LVEF remained diminished and he ultimately underwent BiV ICD insertion.  He has been maintained on amiodarone  and remains in NSR following aborted ablation.  Most recent echocardiogram 10/2023 with an LVEF improved to 40-45%.  He was recently seen by Dr. Schilling 11/27/2023 who increased his carvedilol  for better GDMT.  He presents today for 27-month follow-up.  He did not tolerate the higher does of coreg  due to lower BP in the 100s - intermittent dizziness. BP log was accidentally left in the car. Remains on high dose entresto  and spironolactone . Euvolemic on exam.   Past Medical History:  Diagnosis Date   Anemia    Aortic insufficiency    with bicuspid aortic valve (the last MRI  demonstrated the aortic root to be 4.8 x 4.7 cm.) Mod regurgitation. 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   Pericardial effusion 07/2009   admission to [facility], s/p pericardial window   Pleural effusion 10/2009   s/p thoracentesis   PNA (pneumonia) 10/2009   admnission to [facility], with right pleural effusion, s/p thoracentesis   Sleep apnea    Syncope 08/2009   admission for fever and syncope, no source for fever seen on cultures. Syncope thought to be r/t orthostasis   Warfarin anticoagulation     Past Surgical History:  Procedure Laterality Date   ATRIAL FIBRILLATION ABLATION N/A 02/11/2023   Procedure: ATRIAL FIBRILLATION ABLATION;  Surgeon: Holts Ole ONEIDA,  MD;  Location: [facility] INVASIVE CV LAB;  Service: Cardiovascular;  Laterality: N/A;   BIV ICD INSERTION CRT-D N/A 04/25/2023   Procedure: BIV ICD INSERTION CRT-D;  Surgeon: Cindie Ole DASEN, MD;  Location: [facility] INVASIVE CV LAB;  Service: Cardiovascular;  Laterality: N/A;   CARDIAC VALVE REPLACEMENT  07/10/2009   AVR   Cardiolite EKG  03/10/2003   (-) ? small/apical infarct EF 50%   CARDIOVERSION N/A 07/24/2022   Procedure: CARDIOVERSION;  Surgeon: Mady Bruckner, MD;  Location: [facility] ORS;  Service: Cardiovascular;  Laterality: N/A;   DOPPLER ECHOCARDIOGRAPHY  01/18/1997   LVH, mild dilated aorta - root mod to severe aortic regurg EF 50-55%   DOPPLER ECHOCARDIOGRAPHY  09/27/2003   No changes, moderate severe aortic regurg   DOPPLER ECHOCARDIOGRAPHY  07/09/2004   Mod severe aortic regurg 2-3+ T.R., T.I.R., mild P..R.   DOPPLER ECHOCARDIOGRAPHY  04/01/2006   Hypokin Apex EF 55%   DOPPLER ECHOCARDIOGRAPHY  02/15/2010   Mild LVH, mild decr Sys fctn EF 40-45% Mild Dias Dysfctn Mech AV Triv AR, MR   ESOPHAGOGASTRODUODENOSCOPY  1990, 1999   Hot dog in throat (1987)/ Chicken in throat (1990)/ 01/06/97 (Dr. Luis) Impaction, dilatation, Schatzki's Ring/ 07/1998 repeat Dilatation/ 01/10/01 stricture/dilated/ 03/08/05 Schatzki's Ring, dilated/ 06/01/08 Food Disimpaction [name] Bulb Ulcer (Dr. Rosalie)   ESOPHAGOGASTRODUODENOSCOPY  01/06/1997   (Dr. Luis) Impaction, dilatation, Schatzki's Ring   ESOPHAGOGASTRODUODENOSCOPY  08/09/1998   Repeat dilatation Verda)   ESOPHAGOGASTRODUODENOSCOPY  01/13/2001   stricture/ HH, duodenal ulcer / bleeding Oletta)   ESOPHAGOGASTRODUODENOSCOPY  12/25/2001   esophagus stricture, dilated   ESOPHAGOGASTRODUODENOSCOPY  03/23/2001   stricture, dilated   ESOPHAGOGASTRODUODENOSCOPY  03/08/2005   Schatzki's Ring   03/13/05  Schatzki's Ring - dilated, duodenitis   ESOPHAGOGASTRODUODENOSCOPY  06/11/2008   with food disimpaction.  Food impaction Ms HH  Sm bulb ulcer (Dr. Rosalie)   ESOPHAGOGASTRODUODENOSCOPY     stricture/HH, duodenal ulcer/bleeding Oletta)   ESOPHAGOGASTRODUODENOSCOPY N/A 01/23/2016   Procedure: ESOPHAGOGASTRODUODENOSCOPY (EGD);  Surgeon: Rogelia Copping, MD;  Location: [facility] ENDOSCOPY;  Service: Endoscopy;  Laterality: N/A;   ESOPHAGOGASTRODUODENOSCOPY N/A 03/10/2021   Procedure: ESOPHAGOGASTRODUODENOSCOPY (EGD);  Surgeon: Stacia Glendia BRAVO, MD;  Location: [facility];  Service: Gastroenterology;   Laterality: N/A;   ESOPHAGOGASTRODUODENOSCOPY (EGD) WITH PROPOFOL  N/A 02/06/2016   Procedure: ESOPHAGOGASTRODUODENOSCOPY (EGD) WITH PROPOFOL ;  Surgeon: Rogelia Copping, MD;  Location: [facility] ENDOSCOPY;  Service: Endoscopy;  Laterality: N/A;   FOREIGN BODY REMOVAL  03/10/2021   Procedure: FOREIGN BODY REMOVAL;  Surgeon: Stacia Glendia BRAVO, MD;  Location: [facility] ENDOSCOPY;  Service: Gastroenterology;;   HERNIA REPAIR  01/28/2009   abdominal; post AVR   LEAD INSERTION N/A 04/25/2023   Procedure: LEAD INSERTION;  Surgeon: Cindie Ole DASEN, MD;  Location: [facility] INVASIVE CV LAB;  Service: Cardiovascular;  Laterality: N/A;   MRI  03/01/2003   LVH global hypokin EF 48% Mod A.I.   RIGHT/LEFT HEART CATH AND CORONARY ANGIOGRAPHY Bilateral 10/10/2022   Procedure: RIGHT/LEFT HEART CATH AND CORONARY ANGIOGRAPHY;  Surgeon: Rolan Ezra RAMAN, MD;  Location: [facility] INVASIVE CV LAB;  Service: Cardiovascular;  Laterality: Bilateral;   VALVE REPLACEMENT  06/28/2009   St. Jude mechanical valve per Dr. Lucas    Current Medications: Active Medications[1]   Allergies:   Ace inhibitors, Aspirin , Other, Primaxin [imipenem w/cilastatin sodium], and Tape   Social History   Socioeconomic History   Marital status: Married    Spouse name: [name]   Number of children: 2   Years of education: Not on  file   Highest education level: Not on file  Occupational History   Occupation: mech. supervisor    Employer: [employer]    Comment[place]   Occupation: RETIRED  Tobacco Use   Smoking status: Never   Smokeless tobacco: Never  Vaping Use   Vaping status: Never Used  Substance and Sexual Activity   Alcohol use: No    Alcohol/week: 0.0 standard drinks of alcohol   Drug use: No   Sexual activity: Not on file  Other Topics Concern   Not on file  Social History Narrative   Married 1996   Lives with his wife and his brother in social worker and grandson/2025   Social Drivers of Health   Tobacco Use: Low Risk  (01/27/2024)   Patient History    Smoking Tobacco Use: Never    Smokeless Tobacco Use: Never    Passive Exposure: Not on file  Financial Resource Strain: Low Risk (01/24/2023)   Overall Financial Resource Strain (CARDIA)    Difficulty of Paying Living Expenses: Not hard at all  Food Insecurity: No Food Insecurity (01/26/2024)   Epic    Worried About Radiation Protection Practitioner of Food in the Last Year: Never true    Ran Out of Food in the Last Year: Never true  Transportation Needs: No Transportation Needs (01/26/2024)   Epic    Lack of Transportation (Medical): No    Lack of Transportation (Non-Medical): No  Physical Activity: Insufficiently Active (01/26/2024)   Exercise Vital Sign    Days of Exercise per Week: 7 days    Minutes of Exercise per Session: 20 min  Stress: No Stress Concern Present (01/26/2024)   [employer] of Occupational Health - Occupational Stress Questionnaire    Feeling of Stress: Not at all  Social Connections: Socially Integrated (01/26/2024)   Social Connection and Isolation Panel    Frequency of Communication with Friends and Family: More than three times a week    Frequency of Social Gatherings with Friends and Family: Once a week    Attends Religious Services: More than 4 times per year    Active Member of Clubs or Organizations: Yes    Attends Banker Meetings: More than 4 times per year    Marital Status: Married  Depression (PHQ2-9): Low Risk (01/26/2024)   Depression (PHQ2-9)    PHQ-2 Score: 0  Alcohol Screen: Low Risk (01/24/2023)   Alcohol Screen    Last Alcohol Screening Score (AUDIT): 0  Housing: Unknown (01/26/2024)   Epic    Unable to Pay for Housing in the Last Year: No    Number of Times Moved in the Last Year: Not on file    Homeless in the Last Year: No  Utilities: Not At Risk (01/26/2024)   Epic    Threatened with loss of utilities: No  Health Literacy: Adequate Health Literacy (01/26/2024)   B1300 Health Literacy     Frequency of need for help with medical instructions: Never     Family History: The patient's family history includes Alcohol abuse in his brother; Cancer in his brother and father; Colon cancer in his paternal aunt; Heart disease in his father; Hypertension in his father and mother; Lymphoma in his father; Obesity in his mother. There is no history of Stomach cancer, Rectal cancer, or Prostate cancer.  ROS:   Please see the history of present illness.     All other systems reviewed and are negative.  EKGs/Labs/Other Studies Reviewed:    The following  studies were reviewed today:       Recent Labs: 07/30/2023: Magnesium  1.8; TSH 1.910 08/21/2023: ALT 21; B Natriuretic Peptide 51.5 11/27/2023: Hemoglobin 15.0; Platelets 153 12/15/2023: BUN 12; Creatinine, Ser 1.16; Potassium 4.6; Sodium 137  Recent Lipid Panel    Component Value Date/Time   CHOL 170 03/04/2023 0812   TRIG 128.0 03/04/2023 0812   HDL 56.00 03/04/2023 0812   CHOLHDL 3 03/04/2023 0812   VLDL 25.6 03/04/2023 0812   LDLCALC 89 03/04/2023 0812   LDLDIRECT 97.0 12/21/2019 1517     Risk Assessment/Calculations:                Physical Exam:    VS:  BP (!) 132/58   Pulse 61   Ht 5' 10.5 (1.791 m)   Wt 227 lb (103 kg)   SpO2 98%   BMI 32.11 kg/m     Wt Readings from Last 3 Encounters:  01/27/24 227 lb (103 kg)  01/26/24 226 lb 9.6 oz (102.8 kg)  11/27/23 224 lb 12.8 oz (102 kg)     GEN:  Well nourished, well developed in no acute distress HEENT: Normal NECK: No JVD; No carotid bruits LYMPHATICS: No lymphadenopathy CARDIAC: RRR, crisp valve click RESPIRATORY:  Clear to auscultation without rales, wheezing or rhonchi  ABDOMEN: Soft, non-tender, non-distended MUSCULOSKELETAL:  No edema; No deformity  SKIN: Warm and dry NEUROLOGIC:  Alert and oriented x 3 PSYCHIATRIC:  Normal affect   ASSESSMENT:    1. Chronic systolic HF (heart failure) (HCC)   2. Non-ischemic cardiomyopathy (HCC)   3. Cardiac  resynchronization therapy defibrillator (CRT-D) in place   4. Primary hypertension   5. Persistent atrial fibrillation (HCC)   6. Chronic anticoagulation   7. S/P AVR    PLAN:    In order of problems listed above:  Chronic systolic heart failure Nonischemic cardiomyopathy ICD in place Hypertension GDMT: 12.5 mg carvedilol  twice daily, 10 mg Jardiance , 97-103 mg Entresto  twice daily, 25 mg spironolactone  - Appears euvolemic - Recently increased carvedilol  to 12.5 mg twice daily - did not tolerate, will transition to 25 mg toprol at night with BP log at ~10am   CAD Hyperlipidemia with LDL goal less than 70 - Nonobstructive CAD - Continue 20 mg pravastatin    Paroxysmal atrial fibrillation/flutter -Remains on 100 mg amiodarone  daily - will change coreg  to 25 mg toprol at night   Mechanical aortic valve Chronic anticoagulation - Remains on Coumadin  -Needs SBE Ppx   OSA - Does not tolerate CPAP   Follow up in 6 weeks - has not taken medications yet, BP is 132/58. I suspect we may need to reduce his entresto  dose. He will bring BP log in next visit. If BP stable on the above regemin, he may cancel the 6 week appt and return in 3 months with Dr. Lavona.            Medication Adjustments/Labs and Tests Ordered: Current medicines are reviewed at length with the patient today.  Concerns regarding medicines are outlined above.  No orders of the defined types were placed in this encounter.  Meds ordered this encounter  Medications   metoprolol succinate (TOPROL XL) 25 MG 24 hr tablet    Sig: Take 1 tablet (25 mg total) by mouth at bedtime.    Dispense:  90 tablet    Refill:  3    Patient Instructions  Medication Instructions:  STOP Carvedilol  (Coreg ) 12.5 mg  START Metoprolol Succinate (Toprol XL) 25 mg. Take  one (1) tablet by mouth at night.  *If you need a refill on your cardiac medications before your next appointment, please call your pharmacy*  Lab  Work: None ordered If you have labs (blood work) drawn today and your tests are completely normal, you will receive your results only by: MyChart Message (if you have MyChart) OR A paper copy in the mail If you have any lab test that is abnormal or we need to change your treatment, we will call you to review the results.  Testing/Procedures: None ordered  Follow-Up: At [facility], you and your health needs are our priority.  As part of our continuing mission to provide you with exceptional heart care, our providers are all part of one team.  This team includes your primary Cardiologist (physician) and Advanced Practice Providers or APPs (Physician Assistants and Nurse Practitioners) who all work together to provide you with the care you need, when you need it.  Your next appointment:   3 month(s)  Provider:   [name], MD   6 Weeks with any APP. Can cancel if Blood Pressure is controlled.    We recommend signing up for the patient portal called MyChart.  Sign up information is provided on this After Visit Summary.  MyChart is used to connect with patients for Virtual Visits (Telemedicine).  Patients are able to view lab/test results, encounter notes, upcoming appointments, etc.  Non-urgent messages can be sent to your provider as well.   To learn more about what you can do with MyChart, go to [email or web address].              Signed, Jon Garre Guss Farruggia, PA  01/27/2024 10:14 AM    [facility]     [1]  Current Meds  Medication Sig   Accu-Chek FastClix Lancets MISC Use as instructed to check blood sugar once a day   amiodarone  (PACERONE ) 200 MG tablet Take 0.5 tablets (100 mg total) by mouth daily.   azelastine  (ASTELIN ) 0.1 % nasal spray PLACE TWO SPRAYS INTO BOTH NOSTRILS TWO TIMES DAILY AS NEEDED FOR RHINITIS. USE IN EACH NOSTRIL AS DIRECTED   cetirizine (ZYRTEC) 10 MG tablet Take 10 mg by mouth daily as needed for allergies.    empagliflozin  (JARDIANCE ) 10 MG TABS tablet Take 1 tablet (10 mg total) by mouth daily.   FIBER GUMMIES PO Take 1 capsule by mouth daily.   fluocinonide  cream (LIDEX ) 0.05 % APPLY 1 APPLICATION TO AFFECTEDE AREAS 2 TIMES DAILY AS NEEDED   glucose blood (ACCU-CHEK AVIVA PLUS) test strip Use as instructed to check blood sugar once a day   Lancets Misc. (ACCU-CHEK FASTCLIX LANCET) KIT AS DIRECTED   metoprolol succinate (TOPROL XL) 25 MG 24 hr tablet Take 1 tablet (25 mg total) by mouth at bedtime.   multivitamin (ONE-A-DAY MEN'S) TABS tablet Take 1 tablet by mouth daily.   omeprazole  (PRILOSEC ) 20 MG capsule TAKE ONE CAPSULE BY MOUTH ONCE DAILY   pantoprazole  (PROTONIX ) 40 MG tablet Take 40 mg by mouth daily.   pravastatin  (PRAVACHOL ) 20 MG tablet TAKE ONE TABLET BY MOUTH ONCE A DAY   sacubitril -valsartan  (ENTRESTO ) 97-103 MG Take 1 tablet by mouth 2 (two) times daily. PLEASE SCHEDULE APPOINTMENT FOR MORE REFILLS [phone]   spironolactone  (ALDACTONE ) 25 MG tablet Take 1 tablet (25 mg total) by mouth daily. PLEASE SCHEDULE APPOINTMENT FOR MORE REFILLS   tamsulosin  (FLOMAX ) 0.4 MG CAPS capsule Take 2 capsules (0.8 mg total) by mouth daily.   warfarin (COUMADIN )  3 MG tablet TAKE ONE TABLET BY MOUTH DAILY OR AS DIRECTED BY ANTICOAGULATION CLINIC   "

## 2024-01-21 NOTE — Telephone Encounter (Signed)
 Pt is compliant with warfarin management and PCP apts.  Sent in refill of warfarin to requested pharmacy.

## 2024-01-26 ENCOUNTER — Ambulatory Visit: Payer: Medicare PPO

## 2024-01-26 ENCOUNTER — Other Ambulatory Visit: Payer: Self-pay | Admitting: Family Medicine

## 2024-01-26 VITALS — BP 110/65 | HR 89 | Ht 68.9 in | Wt 226.6 lb

## 2024-01-26 DIAGNOSIS — Z0000 Encounter for general adult medical examination without abnormal findings: Secondary | ICD-10-CM | POA: Diagnosis not present

## 2024-01-26 DIAGNOSIS — K219 Gastro-esophageal reflux disease without esophagitis: Secondary | ICD-10-CM

## 2024-01-26 NOTE — Progress Notes (Signed)
 "  Chief Complaint  Patient presents with   Medicare Wellness     Subjective:   [name] [name] is a 73 y.o. male who presents for a Medicare Annual Wellness Visit.  Visit info / Clinical Intake: Medicare Wellness Visit Type:: Subsequent Annual Wellness Visit Persons participating in visit and providing information:: patient Medicare Wellness Visit Mode:: Video Since this visit was completed virtually, some vitals may be partially provided or unavailable. Missing vitals are due to the limitations of the virtual format.: Documented vitals are patient reported If Telephone or Video please confirm:: I connected with patient using audio/video enable telemedicine. I verified patient identity with two identifiers, discussed telehealth limitations, and patient agreed to proceed. Patient Location:: Home Provider Location:: Home Interpreter Needed?: No Pre-visit prep was completed: yes AWV questionnaire completed by patient prior to visit?: no Living arrangements:: lives with spouse/significant other Patient's Overall Health Status Rating: very good Typical amount of pain: none Does pain affect daily life?: no Are you currently prescribed opioids?: no  Dietary Habits and Nutritional Risks How many meals a day?: 3 Eats fruit and vegetables daily?: (!) no Most meals are obtained by: preparing own meals In the last 2 weeks, have you had any of the following?: none Diabetic:: (!) yes Any non-healing wounds?: no How often do you check your BS?: 2 (150 in am-per pt) Would you like to be referred to a Nutritionist or for Diabetic Management? : no  Functional Status Activities of Daily Living (to include ambulation/medication): Independent Ambulation: Independent with device- listed below Home Assistive Devices/Equipment: Eyeglasses Medication Administration: Independent Home Management (perform basic housework or laundry): Independent Manage your own finances?: yes Primary  transportation is: driving Concerns about vision?: no *vision screening is required for WTM* Concerns about hearing?: (!) yes Uses hearing aids?: (!) yes (has hearing aides/does not wear them) Hear whispered voice?: (!) no *in-person visit only*  Fall Screening Falls in the past year?: 0 Number of falls in past year: 0 Was there an injury with Fall?: 0 Fall Risk Category Calculator: 0 Patient Fall Risk Level: Low Fall Risk  Fall Risk Patient at Risk for Falls Due to: No Fall Risks Fall risk Follow up: Falls evaluation completed; Falls prevention discussed  Home and Transportation Safety: All rugs have non-skid backing?: yes All stairs or steps have railings?: yes (2 story home) Grab bars in the bathtub or shower?: yes (walkin tub) Have non-skid surface in bathtub or shower?: yes Good home lighting?: yes Regular seat belt use?: yes Hospital stays in the last year:: no  Cognitive Assessment Difficulty concentrating, remembering, or making decisions? : no Will 6CIT or Mini Cog be Completed: no 6CIT or Mini Cog Declined: patient alert, oriented, able to answer questions appropriately and recall recent events  Advance Directives (For Healthcare) Does Patient Have a Medical Advance Directive?: Yes Does patient want to make changes to medical advance directive?: No - Guardian declined Type of Advance Directive: Healthcare Power of [place]; Living will Copy of Healthcare Power of Attorney in Chart?: No - copy requested Copy of Living Will in Chart?: No - copy requested  Reviewed/Updated  Reviewed/Updated: Reviewed All (Medical, Surgical, Family, Medications, Allergies, Care Teams, Patient Goals)    Allergies (verified) Ace inhibitors, Aspirin , Other, Primaxin [imipenem w/cilastatin sodium], and Tape   Current Medications (verified) Outpatient Encounter Medications as of 01/26/2024  Medication Sig   Accu-Chek FastClix Lancets MISC Use as instructed to check blood sugar once a  day   amiodarone  (PACERONE ) 200 MG tablet  Take 0.5 tablets (100 mg total) by mouth daily.   azelastine  (ASTELIN ) 0.1 % nasal spray PLACE TWO SPRAYS INTO BOTH NOSTRILS TWO TIMES DAILY AS NEEDED FOR RHINITIS. USE IN EACH NOSTRIL AS DIRECTED   cetirizine (ZYRTEC) 10 MG tablet Take 10 mg by mouth daily as needed for allergies.   empagliflozin  (JARDIANCE ) 10 MG TABS tablet Take 1 tablet (10 mg total) by mouth daily.   FIBER GUMMIES PO Take 1 capsule by mouth daily.   fluocinonide  cream (LIDEX ) 0.05 % APPLY 1 APPLICATION TO AFFECTEDE AREAS 2 TIMES DAILY AS NEEDED   glucose blood (ACCU-CHEK AVIVA PLUS) test strip Use as instructed to check blood sugar once a day   Lancets Misc. (ACCU-CHEK FASTCLIX LANCET) KIT AS DIRECTED   multivitamin (ONE-A-DAY MEN'S) TABS tablet Take 1 tablet by mouth daily.   omeprazole  (PRILOSEC ) 20 MG capsule TAKE ONE CAPSULE BY MOUTH ONCE DAILY   pantoprazole  (PROTONIX ) 40 MG tablet Take 40 mg by mouth daily.   pravastatin  (PRAVACHOL ) 20 MG tablet TAKE ONE TABLET BY MOUTH ONCE A DAY   sacubitril -valsartan  (ENTRESTO ) 97-103 MG Take 1 tablet by mouth 2 (two) times daily. PLEASE SCHEDULE APPOINTMENT FOR MORE REFILLS [phone]   spironolactone  (ALDACTONE ) 25 MG tablet Take 1 tablet (25 mg total) by mouth daily. PLEASE SCHEDULE APPOINTMENT FOR MORE REFILLS   tamsulosin  (FLOMAX ) 0.4 MG CAPS capsule Take 2 capsules (0.8 mg total) by mouth daily.   warfarin (COUMADIN ) 3 MG tablet TAKE ONE TABLET BY MOUTH DAILY OR AS DIRECTED BY ANTICOAGULATION CLINIC   carvedilol  (COREG ) 12.5 MG tablet Take 1 tablet (12.5 mg total) by mouth 2 (two) times daily. (Patient not taking: Reported on 01/26/2024)   glipiZIDE  (GLUCOTROL ) 5 MG tablet 1 tab if AM sugar is above 150, 2 tabs if AM sugar is above 175. If sugar is below 150, skip dose. (Patient not taking: Reported on 01/26/2024)   metFORMIN  (GLUCOPHAGE ) 500 MG tablet TAKE TWO TABLETS BY MOUTH TWO TIMES DAILY (Patient not taking: Reported on  01/26/2024)   No facility-administered encounter medications on file as of 01/26/2024.    History: Past Medical History:  Diagnosis Date   Anemia    Aortic insufficiency    with bicuspid aortic valve (the last MRI demonstrated the aortic root to be 4.8 x 4.7 cm.) Mod regurgitation. 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   Pericardial effusion 07/2009   admission to [facility], s/p pericardial window   Pleural effusion 10/2009   s/p thoracentesis   PNA (pneumonia) 10/2009   admnission to [facility], with right pleural effusion, s/p thoracentesis   Sleep apnea    Syncope 08/2009  admission for fever and syncope, no source for fever seen on cultures. Syncope thought to be r/t orthostasis   Warfarin anticoagulation    Past Surgical History:  Procedure Laterality Date   ATRIAL FIBRILLATION ABLATION N/A 02/11/2023   Procedure: ATRIAL FIBRILLATION ABLATION;  Surgeon: Cindie Ole DASEN, MD;  Location: [facility] INVASIVE CV LAB;  Service: Cardiovascular;  Laterality: N/A;   BIV ICD INSERTION CRT-D N/A 04/25/2023   Procedure: BIV ICD INSERTION CRT-D;  Surgeon: Cindie Ole DASEN, MD;  Location: [facility] INVASIVE CV LAB;  Service:  Cardiovascular;  Laterality: N/A;   CARDIAC VALVE REPLACEMENT  07/10/2009   AVR   Cardiolite EKG  03/10/2003   (-) ? small/apical infarct EF 50%   CARDIOVERSION N/A 07/24/2022   Procedure: CARDIOVERSION;  Surgeon: Mady Bruckner, MD;  Location: [facility] ORS;  Service: Cardiovascular;  Laterality: N/A;   DOPPLER ECHOCARDIOGRAPHY  01/18/1997   LVH, mild dilated aorta - root mod to severe aortic regurg EF 50-55%   DOPPLER ECHOCARDIOGRAPHY  09/27/2003   No changes, moderate severe aortic regurg   DOPPLER ECHOCARDIOGRAPHY  07/09/2004   Mod severe aortic regurg 2-3+ T.R., T.I.R., mild P..R.   DOPPLER ECHOCARDIOGRAPHY  04/01/2006   Hypokin Apex EF 55%   DOPPLER ECHOCARDIOGRAPHY  02/15/2010   Mild LVH, mild decr Sys fctn EF 40-45% Mild Dias Dysfctn Mech AV Triv AR, MR   ESOPHAGOGASTRODUODENOSCOPY  1990, 1999   Hot dog in throat (1987)/ Chicken in throat (1990)/ 01/06/97 (Dr. Luis) Impaction, dilatation, Schatzki's Ring/ 07/1998 repeat Dilatation/ 01/10/01 stricture/dilated/ 03/08/05 Schatzki's Ring, dilated/ 06/01/08 Food Disimpaction [name] Bulb Ulcer (Dr. Rosalie)   ESOPHAGOGASTRODUODENOSCOPY  01/06/1997   (Dr. Luis) Impaction, dilatation, Schatzki's Ring   ESOPHAGOGASTRODUODENOSCOPY  08/09/1998   Repeat dilatation Verda)   ESOPHAGOGASTRODUODENOSCOPY  01/13/2001   stricture/ HH, duodenal ulcer / bleeding Oletta)   ESOPHAGOGASTRODUODENOSCOPY  12/25/2001   esophagus stricture, dilated   ESOPHAGOGASTRODUODENOSCOPY  03/23/2001   stricture, dilated   ESOPHAGOGASTRODUODENOSCOPY  03/08/2005   Schatzki's Ring   03/13/05  Schatzki's Ring - dilated, duodenitis   ESOPHAGOGASTRODUODENOSCOPY  06/11/2008   with food disimpaction.  Food impaction Ms HH  Sm bulb ulcer (Dr. Rosalie)   ESOPHAGOGASTRODUODENOSCOPY     stricture/HH, duodenal ulcer/bleeding Oletta)   ESOPHAGOGASTRODUODENOSCOPY N/A 01/23/2016   Procedure: ESOPHAGOGASTRODUODENOSCOPY (EGD);  Surgeon: Rogelia Copping, MD;  Location: [facility] ENDOSCOPY;   Service: Endoscopy;  Laterality: N/A;   ESOPHAGOGASTRODUODENOSCOPY N/A 03/10/2021   Procedure: ESOPHAGOGASTRODUODENOSCOPY (EGD);  Surgeon: Stacia Glendia BRAVO, MD;  Location: [facility];  Service: Gastroenterology;  Laterality: N/A;   ESOPHAGOGASTRODUODENOSCOPY (EGD) WITH PROPOFOL  N/A 02/06/2016   Procedure: ESOPHAGOGASTRODUODENOSCOPY (EGD) WITH PROPOFOL ;  Surgeon: Rogelia Copping, MD;  Location: [facility] ENDOSCOPY;  Service: Endoscopy;  Laterality: N/A;   FOREIGN BODY REMOVAL  03/10/2021   Procedure: FOREIGN BODY REMOVAL;  Surgeon: Stacia Glendia BRAVO, MD;  Location: [facility] ENDOSCOPY;  Service: Gastroenterology;;   HERNIA REPAIR  01/28/2009   abdominal; post AVR   LEAD INSERTION N/A 04/25/2023   Procedure: LEAD INSERTION;  Surgeon: Cindie Ole DASEN, MD;  Location: [facility] INVASIVE CV LAB;  Service: Cardiovascular;  Laterality: N/A;   MRI  03/01/2003   LVH global hypokin EF 48% Mod A.I.   RIGHT/LEFT HEART CATH AND CORONARY ANGIOGRAPHY Bilateral 10/10/2022   Procedure: RIGHT/LEFT HEART CATH AND CORONARY ANGIOGRAPHY;  Surgeon: Rolan Ezra RAMAN, MD;  Location: [facility] INVASIVE CV LAB;  Service: Cardiovascular;  Laterality: Bilateral;   VALVE REPLACEMENT  06/28/2009   St. Jude mechanical valve per Dr. Lucas   Family History  Problem Relation Age of Onset   Obesity Mother        bedridden   Hypertension Mother    Heart disease Father        CABG, CAD   Hypertension Father    Lymphoma Father    Cancer Father        lymphoma   Alcohol abuse Brother    Cancer Brother        melanoma   Colon cancer Paternal Aunt    Stomach cancer Neg Hx    Rectal cancer Neg Hx    Prostate cancer Neg Hx    Social History   Occupational History   Occupation: economist. supervisor    Employer: [employer]    Comment[place]   Occupation: RETIRED  Tobacco Use   Smoking status: Never   Smokeless tobacco: Never  Vaping Use   Vaping status: Never Used  Substance and Sexual Activity   Alcohol use: No     Alcohol/week: 0.0 standard drinks of alcohol   Drug use: No   Sexual activity: Not on file   Tobacco Counseling Counseling given: Not Answered  SDOH Screenings   Food Insecurity: No Food Insecurity (01/26/2024)  Housing: Unknown (01/26/2024)  Transportation Needs: No Transportation Needs (01/26/2024)  Utilities: Not At Risk (01/26/2024)  Alcohol Screen: Low Risk (01/24/2023)  Depression (PHQ2-9): Low Risk (01/26/2024)  Financial Resource Strain: Low Risk (01/24/2023)  Physical Activity: Insufficiently Active (01/26/2024)  Social Connections: Socially Integrated (01/26/2024)  Stress: No Stress Concern Present (01/26/2024)  Tobacco Use: Low Risk (01/26/2024)  Health Literacy: Adequate Health Literacy (01/26/2024)   See flowsheets for full screening details  Depression Screen PHQ 2 & 9 Depression Scale- Over the past 2 weeks, how often have you been bothered by any of the following problems? Little interest or pleasure in doing things: 0 Feeling down, depressed, or hopeless (PHQ Adolescent also includes...irritable): 0 PHQ-2 Total Score: 0 Trouble falling or staying asleep, or sleeping too much: 0 Feeling tired or having little energy: 0 Poor appetite or overeating (PHQ Adolescent also includes...weight loss): 0 Feeling bad about yourself - or that you are a failure or have let yourself or your family down: 0 Trouble concentrating on things, such as reading the newspaper or watching television (PHQ Adolescent also includes...like school work): 0 Moving or speaking so slowly that other people could have noticed. Or the opposite - being so fidgety or restless that you have been moving around a lot more than usual: 0 Thoughts that you would be better off dead, or of hurting yourself in some way: 0 PHQ-9 Total Score: 0 If you checked off any problems, how difficult have these problems made it for you to do your work, take care of things at home, or get along with other people?: Not  difficult at all  Depression Treatment Depression Interventions/Treatment : EYV7-0 Score <4 Follow-up Not Indicated     Goals Addressed             This Visit's Progress    Increase physical activity       Water aerobics-will continue this goal/2025             Objective:    Today's Vitals   01/26/24 1006  BP: 110/65  Pulse: 89  SpO2: 96%  Weight: 226 lb 9.6 oz (102.8 kg)  Height: 5' 8.9 (1.75 m)   Body mass index is 33.56 kg/m.  Hearing/Vision screen Hearing Screening - Comments:: Has hearing aides but does not  wear them Vision Screening - Comments:: Wears eyeglasses/[facility]/UTD Immunizations and Health Maintenance Health Maintenance  Topic Date Due   Colonoscopy  07/10/2021   DTaP/Tdap/Td (3 - Td or Tdap) 04/02/2023   Influenza Vaccine  08/29/2023   COVID-19 Vaccine (7 - 2025-26 season) 11/23/2027 (Originally 09/29/2023)   HEMOGLOBIN A1C  03/25/2024   OPHTHALMOLOGY EXAM  07/21/2024   Diabetic kidney evaluation - Urine ACR  09/22/2024   FOOT EXAM  09/22/2024   Diabetic kidney evaluation - eGFR measurement  12/14/2024   Medicare Annual Wellness (AWV)  01/25/2025   Pneumococcal Vaccine: 50+ Years  Completed   Hepatitis C Screening  Completed   Zoster Vaccines- Shingrix  Completed   Meningococcal B Vaccine  Aged Out        Assessment/Plan:  This is a routine wellness examination for [name] [name].  Patient Care Team: [name], MD as PCP - General (Family Medicine) Lavona Agent, MD as PCP - Cardiology (Cardiology) Cindie Ole DASEN, MD as PCP - Electrophysiology (Clinical Cardiac Electrophysiology) Vanderbilt Ned, MD as Consulting Physician (General Surgery) Portia Fireman, OD (Optometry) [name], Morna SAILOR, [facility] (Inactive) as Pharmacist (Pharmacist) Lavona Agent, MD as Consulting Physician (Cardiology) Cindie Ole DASEN, MD as Consulting Physician (Cardiology) [name], [name], NP as Nurse Practitioner (Clinical Cardiac  Electrophysiology)  I have personally reviewed and noted the following in the patients chart:   Medical and social history Use of alcohol, tobacco or illicit drugs  Current medications and supplements including opioid prescriptions. Functional ability and status Nutritional status Physical activity Advanced directives List of other physicians Hospitalizations, surgeries, and ER visits in previous 12 months Vitals Screenings to include cognitive, depression, and falls Referrals and appointments  No orders of the defined types were placed in this encounter.  In addition, I have reviewed and discussed with patient certain preventive protocols, quality metrics, and best practice recommendations. A written personalized care plan for preventive services as well as general preventive health recommendations were provided to patient.   Lacrisha Bielicki L Decklin Weddington, CMA   01/26/2024   Return in 1 year (on 01/25/2025).  After Visit Summary: (MyChart) Due to this being a telephonic visit, the after visit summary with patients personalized plan was offered to patient via MyChart   Nurse Notes: Patient is due for a Tdap.  Patient stated that he stopped taking Glipizide  5 mg daily, however, he did not state how long he has been off of it. He had no other concerns to address today. "

## 2024-01-26 NOTE — Patient Instructions (Addendum)
 [name] [name],  Thank you for taking the time for your Medicare Wellness Visit. I appreciate your continued commitment to your health goals. Please review the care plan we discussed, and feel free to reach out if I can assist you further.  Please note that Annual Wellness Visits do not include a physical exam. Some assessments may be limited, especially if the visit was conducted virtually. If needed, we may recommend an in-person follow-up with your provider.  Ongoing Care Seeing your primary care provider every 3 to 6 months helps us  monitor your health and provide consistent, personalized care. Next office visit on 03/25/2024.  You are due for a tetanus vaccine and can get that done at your local pharmacy.  Keep up the good work.  Referrals If a referral was made during today's visit and you haven't received any updates within two weeks, please contact the referred provider directly to check on the status.  Recommended Screenings:  Health Maintenance  Topic Date Due   Colon Cancer Screening  07/10/2021   DTaP/Tdap/Td vaccine (3 - Td or Tdap) 04/02/2023   Flu Shot  08/29/2023   COVID-19 Vaccine (7 - 2025-26 season) 11/23/2027*   Hemoglobin A1C  03/25/2024   Eye exam for diabetics  07/21/2024   Yearly kidney health urinalysis for diabetes  09/22/2024   Complete foot exam   09/22/2024   Yearly kidney function blood test for diabetes  12/14/2024   Medicare Annual Wellness Visit  01/25/2025   Pneumococcal Vaccine for age over 62  Completed   Hepatitis C Screening  Completed   Zoster (Shingles) Vaccine  Completed   Meningitis B Vaccine  Aged Out  *Topic was postponed. The date shown is not the original due date.       01/26/2024   10:15 AM  Advanced Directives  Does Patient Have a Medical Advance Directive? Yes  Type of Estate Agent of [place];Living will  Copy of Healthcare Power of Attorney in Chart? No - copy requested    Vision: Annual vision screenings  are recommended for early detection of glaucoma, cataracts, and diabetic retinopathy. These exams can also reveal signs of chronic conditions such as diabetes and high blood pressure.  Dental: Annual dental screenings help detect early signs of oral cancer, gum disease, and other conditions linked to overall health, including heart disease and diabetes.  Please see the attached documents for additional preventive care recommendations.

## 2024-01-27 ENCOUNTER — Encounter: Payer: Self-pay | Admitting: Physician Assistant

## 2024-01-27 ENCOUNTER — Ambulatory Visit: Attending: Physician Assistant | Admitting: Physician Assistant

## 2024-01-27 VITALS — BP 132/58 | HR 61 | Ht 70.5 in | Wt 227.0 lb

## 2024-01-27 DIAGNOSIS — I10 Essential (primary) hypertension: Secondary | ICD-10-CM | POA: Diagnosis not present

## 2024-01-27 DIAGNOSIS — Z95810 Presence of automatic (implantable) cardiac defibrillator: Secondary | ICD-10-CM

## 2024-01-27 DIAGNOSIS — I428 Other cardiomyopathies: Secondary | ICD-10-CM | POA: Diagnosis not present

## 2024-01-27 DIAGNOSIS — Z952 Presence of prosthetic heart valve: Secondary | ICD-10-CM

## 2024-01-27 DIAGNOSIS — Z7901 Long term (current) use of anticoagulants: Secondary | ICD-10-CM

## 2024-01-27 DIAGNOSIS — I4819 Other persistent atrial fibrillation: Secondary | ICD-10-CM | POA: Diagnosis not present

## 2024-01-27 DIAGNOSIS — I5022 Chronic systolic (congestive) heart failure: Secondary | ICD-10-CM

## 2024-01-27 MED ORDER — METOPROLOL SUCCINATE ER 25 MG PO TB24: 25.0000 mg | ORAL_TABLET | Freq: Every day | ORAL | 3 refills | Status: AC

## 2024-01-27 NOTE — Patient Instructions (Signed)
 Medication Instructions:  STOP Carvedilol  (Coreg ) 12.5 mg  START Metoprolol Succinate (Toprol XL) 25 mg. Take one (1) tablet by mouth at night.  *If you need a refill on your cardiac medications before your next appointment, please call your pharmacy*  Lab Work: None ordered If you have labs (blood work) drawn today and your tests are completely normal, you will receive your results only by: MyChart Message (if you have MyChart) OR A paper copy in the mail If you have any lab test that is abnormal or we need to change your treatment, we will call you to review the results.  Testing/Procedures: None ordered  Follow-Up: At [facility], you and your health needs are our priority.  As part of our continuing mission to provide you with exceptional heart care, our providers are all part of one team.  This team includes your primary Cardiologist (physician) and Advanced Practice Providers or APPs (Physician Assistants and Nurse Practitioners) who all work together to provide you with the care you need, when you need it.  Your next appointment:   3 month(s)  Provider:   [name], MD   6 Weeks with any APP. Can cancel if Blood Pressure is controlled.    We recommend signing up for the patient portal called MyChart.  Sign up information is provided on this After Visit Summary.  MyChart is used to connect with patients for Virtual Visits (Telemedicine).  Patients are able to view lab/test results, encounter notes, upcoming appointments, etc.  Non-urgent messages can be sent to your provider as well.   To learn more about what you can do with MyChart, go to [email or web address].

## 2024-02-02 ENCOUNTER — Telehealth: Payer: Self-pay

## 2024-02-02 NOTE — Telephone Encounter (Signed)
 Copied from CRM [id number]. Topic: Clinical - Medical Advice >> Feb 02, 2024 12:47 PM Shasta L wrote: Reason for CRM: Patient would like to update pharmacy information.  [facility] [address] [place] [address] [phone]

## 2024-02-04 NOTE — Telephone Encounter (Signed)
Pharmacy updated as requested. 

## 2024-02-12 ENCOUNTER — Ambulatory Visit

## 2024-02-12 DIAGNOSIS — Z7901 Long term (current) use of anticoagulants: Secondary | ICD-10-CM | POA: Diagnosis not present

## 2024-02-12 LAB — POCT INR: INR: 2.6 (ref 2.0–3.0)

## 2024-02-12 MED ORDER — WARFARIN SODIUM 1 MG PO TABS: ORAL_TABLET | ORAL | 1 refills | Status: AC

## 2024-02-12 MED ORDER — WARFARIN SODIUM 3 MG PO TABS: ORAL_TABLET | ORAL | 1 refills | Status: AC

## 2024-02-12 NOTE — Progress Notes (Signed)
 Pt is using 3 mg and 1 mg tablets. He requested a refill of both. Sent in refills.  Next apt on 2/19 will be a lab visit to reduce trips to the clinic. Will request a POCT INR or lab INR with the existing PCP labs for that day. Continue  3 mg daily except take 5 mg on Monday. Recheck in 5 weeks.

## 2024-02-12 NOTE — Patient Instructions (Addendum)
 Pre visit review using our clinic review tool, if applicable. No additional management support is needed unless otherwise documented below in the visit note.  Continue  3 mg daily except take 5 mg on Monday. Recheck in 5 weeks.

## 2024-02-16 ENCOUNTER — Other Ambulatory Visit: Payer: Self-pay

## 2024-02-16 MED ORDER — SACUBITRIL-VALSARTAN 97-103 MG PO TABS: 1.0000 | ORAL_TABLET | Freq: Two times a day (BID) | ORAL | 0 refills | Status: AC

## 2024-02-20 ENCOUNTER — Telehealth: Payer: Self-pay

## 2024-02-20 ENCOUNTER — Other Ambulatory Visit (HOSPITAL_COMMUNITY): Payer: Self-pay

## 2024-02-20 NOTE — Telephone Encounter (Signed)
 Advanced Heart Failure Patient Advocate Encounter  Test billing for this patient's current coverage (Health Team Advg) returns a $19.13 copay for 30 day supply of Sacubitril -Valsartan , and $57.24 for 90 day supply.  This test claim was processed through [facility]- copay amounts may vary at other pharmacies due to pharmacy/plan contracts, or as the patient moves through the different stages of their insurance plan.  Rachel DEL, CPhT Rx Patient Advocate Phone: [phone]

## 2024-03-08 ENCOUNTER — Ambulatory Visit: Admitting: Cardiology

## 2024-03-08 ENCOUNTER — Encounter

## 2024-03-18 ENCOUNTER — Other Ambulatory Visit

## 2024-03-25 ENCOUNTER — Encounter: Admitting: Family Medicine

## 2024-04-16 ENCOUNTER — Ambulatory Visit: Admitting: Cardiology

## 2024-05-17 ENCOUNTER — Ambulatory Visit: Admitting: Cardiology

## 2024-06-07 ENCOUNTER — Encounter

## 2024-09-06 ENCOUNTER — Encounter

## 2024-12-06 ENCOUNTER — Encounter

## 2025-03-07 ENCOUNTER — Encounter
